# Patient Record
Sex: Female | Born: 1965
Health system: Southern US, Community
[De-identification: ages and names within clinical notes are randomized; demographics above are authoritative.]

## PROBLEM LIST (undated history)

## (undated) DIAGNOSIS — D509 Iron deficiency anemia, unspecified: Secondary | ICD-10-CM

## (undated) DIAGNOSIS — R7303 Prediabetes: Secondary | ICD-10-CM

## (undated) DIAGNOSIS — I10 Essential (primary) hypertension: Secondary | ICD-10-CM

## (undated) DIAGNOSIS — I5022 Chronic systolic (congestive) heart failure: Secondary | ICD-10-CM

## (undated) DIAGNOSIS — F32A Depression, unspecified: Secondary | ICD-10-CM

## (undated) DIAGNOSIS — F419 Anxiety disorder, unspecified: Secondary | ICD-10-CM

## (undated) DIAGNOSIS — I428 Other cardiomyopathies: Secondary | ICD-10-CM

## (undated) DIAGNOSIS — F329 Major depressive disorder, single episode, unspecified: Secondary | ICD-10-CM

## (undated) HISTORY — PX: TONSILLECTOMY: SUR1361

## (undated) HISTORY — DX: Depression, unspecified: F32.A

## (undated) HISTORY — DX: Prediabetes: R73.03

## (undated) HISTORY — DX: Iron deficiency anemia, unspecified: D50.9

## (undated) HISTORY — DX: Other cardiomyopathies: I42.8

## (undated) HISTORY — DX: Essential (primary) hypertension: I10

## (undated) HISTORY — DX: Chronic systolic (congestive) heart failure: I50.22

## (undated) HISTORY — DX: Major depressive disorder, single episode, unspecified: F32.9

## (undated) HISTORY — PX: TUBAL LIGATION: SHX77

## (undated) HISTORY — DX: Anxiety disorder, unspecified: F41.9

---

## 1998-06-18 ENCOUNTER — Emergency Department (HOSPITAL_COMMUNITY): Admission: EM | Admit: 1998-06-18 | Discharge: 1998-06-18 | Payer: Self-pay | Admitting: Emergency Medicine

## 2000-12-29 ENCOUNTER — Emergency Department (HOSPITAL_COMMUNITY): Admission: EM | Admit: 2000-12-29 | Discharge: 2000-12-29 | Payer: Self-pay | Admitting: Emergency Medicine

## 2005-06-20 ENCOUNTER — Emergency Department (HOSPITAL_COMMUNITY): Admission: EM | Admit: 2005-06-20 | Discharge: 2005-06-20 | Payer: Self-pay | Admitting: Family Medicine

## 2005-06-23 ENCOUNTER — Emergency Department (HOSPITAL_COMMUNITY): Admission: EM | Admit: 2005-06-23 | Discharge: 2005-06-23 | Payer: Self-pay | Admitting: Family Medicine

## 2014-11-02 DIAGNOSIS — I428 Other cardiomyopathies: Secondary | ICD-10-CM

## 2014-11-02 HISTORY — DX: Other cardiomyopathies: I42.8

## 2014-11-03 ENCOUNTER — Encounter (HOSPITAL_COMMUNITY): Payer: Self-pay | Admitting: Emergency Medicine

## 2014-11-03 ENCOUNTER — Emergency Department (HOSPITAL_COMMUNITY): Payer: No Typology Code available for payment source

## 2014-11-03 ENCOUNTER — Emergency Department (INDEPENDENT_AMBULATORY_CARE_PROVIDER_SITE_OTHER)
Admission: EM | Admit: 2014-11-03 | Discharge: 2014-11-03 | Disposition: A | Discharge status: Nursing Home (Includes ICF) | Payer: Self-pay | Source: Home / Self Care | Attending: Family Medicine | Admitting: Family Medicine

## 2014-11-03 ENCOUNTER — Inpatient Hospital Stay (HOSPITAL_COMMUNITY)
Admission: EM | Admit: 2014-11-03 | Discharge: 2014-11-09 | DRG: 287 | Disposition: A | Discharge status: Home / Self Care | Payer: No Typology Code available for payment source | Attending: Internal Medicine | Admitting: Internal Medicine

## 2014-11-03 DIAGNOSIS — N289 Disorder of kidney and ureter, unspecified: Secondary | ICD-10-CM

## 2014-11-03 DIAGNOSIS — I5023 Acute on chronic systolic (congestive) heart failure: Secondary | ICD-10-CM | POA: Insufficient documentation

## 2014-11-03 DIAGNOSIS — I5033 Acute on chronic diastolic (congestive) heart failure: Secondary | ICD-10-CM | POA: Insufficient documentation

## 2014-11-03 DIAGNOSIS — I5041 Acute combined systolic (congestive) and diastolic (congestive) heart failure: Secondary | ICD-10-CM | POA: Diagnosis present

## 2014-11-03 DIAGNOSIS — R0602 Shortness of breath: Secondary | ICD-10-CM | POA: Diagnosis present

## 2014-11-03 DIAGNOSIS — Z6837 Body mass index (BMI) 37.0-37.9, adult: Secondary | ICD-10-CM | POA: Diagnosis not present

## 2014-11-03 DIAGNOSIS — I5022 Chronic systolic (congestive) heart failure: Secondary | ICD-10-CM | POA: Diagnosis present

## 2014-11-03 DIAGNOSIS — I429 Cardiomyopathy, unspecified: Secondary | ICD-10-CM | POA: Diagnosis present

## 2014-11-03 DIAGNOSIS — I509 Heart failure, unspecified: Secondary | ICD-10-CM

## 2014-11-03 DIAGNOSIS — I10 Essential (primary) hypertension: Secondary | ICD-10-CM | POA: Diagnosis not present

## 2014-11-03 DIAGNOSIS — R600 Localized edema: Secondary | ICD-10-CM

## 2014-11-03 DIAGNOSIS — D509 Iron deficiency anemia, unspecified: Secondary | ICD-10-CM | POA: Diagnosis present

## 2014-11-03 DIAGNOSIS — E669 Obesity, unspecified: Secondary | ICD-10-CM | POA: Diagnosis not present

## 2014-11-03 DIAGNOSIS — R140 Abdominal distension (gaseous): Secondary | ICD-10-CM

## 2014-11-03 DIAGNOSIS — I428 Other cardiomyopathies: Secondary | ICD-10-CM

## 2014-11-03 LAB — I-STAT TROPONIN, ED: Troponin i, poc: 0.02 ng/mL (ref 0.00–0.08)

## 2014-11-03 LAB — CBC WITH DIFFERENTIAL/PLATELET
BASOS ABS: 0 10*3/uL (ref 0.0–0.1)
BASOS PCT: 0 % (ref 0–1)
Eosinophils Absolute: 0.1 10*3/uL (ref 0.0–0.7)
Eosinophils Relative: 1 % (ref 0–5)
HCT: 36.5 % (ref 36.0–46.0)
Hemoglobin: 11.4 g/dL — ABNORMAL LOW (ref 12.0–15.0)
LYMPHS ABS: 2.1 10*3/uL (ref 0.7–4.0)
LYMPHS PCT: 30 % (ref 12–46)
MCH: 21.8 pg — AB (ref 26.0–34.0)
MCHC: 31.2 g/dL (ref 30.0–36.0)
MCV: 69.9 fL — AB (ref 78.0–100.0)
MONO ABS: 0.6 10*3/uL (ref 0.1–1.0)
Monocytes Relative: 9 % (ref 3–12)
Neutro Abs: 4.1 10*3/uL (ref 1.7–7.7)
Neutrophils Relative %: 60 % (ref 43–77)
PLATELETS: 315 10*3/uL (ref 150–400)
RBC: 5.22 MIL/uL — ABNORMAL HIGH (ref 3.87–5.11)
RDW: 17.7 % — AB (ref 11.5–15.5)
WBC: 6.9 10*3/uL (ref 4.0–10.5)

## 2014-11-03 LAB — RETICULOCYTES
RBC.: 5.38 MIL/uL — ABNORMAL HIGH (ref 3.87–5.11)
RETIC COUNT ABSOLUTE: 69.9 10*3/uL (ref 19.0–186.0)
Retic Ct Pct: 1.3 % (ref 0.4–3.1)

## 2014-11-03 LAB — COMPREHENSIVE METABOLIC PANEL
ALBUMIN: 3.5 g/dL (ref 3.5–5.0)
ALT: 18 U/L (ref 14–54)
ANION GAP: 9 (ref 5–15)
AST: 30 U/L (ref 15–41)
Alkaline Phosphatase: 76 U/L (ref 38–126)
BILIRUBIN TOTAL: 1.3 mg/dL — AB (ref 0.3–1.2)
BUN: 10 mg/dL (ref 6–20)
CALCIUM: 9 mg/dL (ref 8.9–10.3)
CO2: 25 mmol/L (ref 22–32)
CREATININE: 1.23 mg/dL — AB (ref 0.44–1.00)
Chloride: 107 mmol/L (ref 101–111)
GFR calc Af Amer: 59 mL/min — ABNORMAL LOW (ref >60)
GFR calc non Af Amer: 51 mL/min — ABNORMAL LOW (ref >60)
GLUCOSE: 91 mg/dL (ref 65–99)
Potassium: 4 mmol/L (ref 3.5–5.1)
SODIUM: 141 mmol/L (ref 135–145)
Total Protein: 7.5 g/dL (ref 6.5–8.1)

## 2014-11-03 LAB — CBC
HCT: 37.7 % (ref 36.0–46.0)
Hemoglobin: 11.7 g/dL — ABNORMAL LOW (ref 12.0–15.0)
MCH: 21.7 pg — ABNORMAL LOW (ref 26.0–34.0)
MCHC: 31 g/dL (ref 30.0–36.0)
MCV: 70.1 fL — AB (ref 78.0–100.0)
PLATELETS: 330 10*3/uL (ref 150–400)
RBC: 5.38 MIL/uL — ABNORMAL HIGH (ref 3.87–5.11)
RDW: 17.9 % — AB (ref 11.5–15.5)
WBC: 8 10*3/uL (ref 4.0–10.5)

## 2014-11-03 LAB — BRAIN NATRIURETIC PEPTIDE: B Natriuretic Peptide: 1168.3 pg/mL — ABNORMAL HIGH (ref 0.0–100.0)

## 2014-11-03 LAB — POC URINE PREG, ED: PREG TEST UR: NEGATIVE

## 2014-11-03 MED ORDER — PNEUMOCOCCAL VAC POLYVALENT 25 MCG/0.5ML IJ INJ
0.5000 mL | INJECTION | INTRAMUSCULAR | Status: AC
  Administered 2014-11-04: 0.5 mL via INTRAMUSCULAR
  Filled 2014-11-03: qty 0.5

## 2014-11-03 MED ORDER — FUROSEMIDE 10 MG/ML IJ SOLN
40.0000 mg | Freq: Once | INTRAMUSCULAR | Status: AC
  Administered 2014-11-03: 40 mg via INTRAVENOUS
  Filled 2014-11-03: qty 4

## 2014-11-03 MED ORDER — LISINOPRIL 5 MG PO TABS
5.0000 mg | ORAL_TABLET | Freq: Every day | ORAL | Status: DC
  Administered 2014-11-04 – 2014-11-09 (×7): 5 mg via ORAL
  Filled 2014-11-03 (×7): qty 1

## 2014-11-03 MED ORDER — ONDANSETRON HCL 4 MG/2ML IJ SOLN
4.0000 mg | Freq: Four times a day (QID) | INTRAMUSCULAR | Status: DC | PRN
  Administered 2014-11-04 – 2014-11-05 (×3): 4 mg via INTRAVENOUS
  Filled 2014-11-03 (×3): qty 2

## 2014-11-03 MED ORDER — SODIUM CHLORIDE 0.9 % IV SOLN
250.0000 mL | INTRAVENOUS | Status: DC | PRN
Start: 2014-11-03 — End: 2014-11-07

## 2014-11-03 MED ORDER — ACETAMINOPHEN 325 MG PO TABS
650.0000 mg | ORAL_TABLET | ORAL | Status: DC | PRN
  Administered 2014-11-05: 650 mg via ORAL
  Filled 2014-11-03: qty 2

## 2014-11-03 MED ORDER — ASPIRIN EC 81 MG PO TBEC
81.0000 mg | DELAYED_RELEASE_TABLET | Freq: Every day | ORAL | Status: DC
  Administered 2014-11-04 – 2014-11-09 (×7): 81 mg via ORAL
  Filled 2014-11-03 (×7): qty 1

## 2014-11-03 MED ORDER — ENOXAPARIN SODIUM 40 MG/0.4ML ~~LOC~~ SOLN
40.0000 mg | SUBCUTANEOUS | Status: DC
  Administered 2014-11-04 – 2014-11-08 (×6): 40 mg via SUBCUTANEOUS
  Filled 2014-11-03 (×7): qty 0.4

## 2014-11-03 MED ORDER — SODIUM CHLORIDE 0.9 % IJ SOLN
3.0000 mL | INTRAMUSCULAR | Status: DC | PRN
  Administered 2014-11-06: 3 mL via INTRAVENOUS
  Filled 2014-11-03: qty 3

## 2014-11-03 MED ORDER — SODIUM CHLORIDE 0.9 % IJ SOLN
3.0000 mL | Freq: Two times a day (BID) | INTRAMUSCULAR | Status: DC
  Administered 2014-11-04 – 2014-11-07 (×6): 3 mL via INTRAVENOUS

## 2014-11-03 MED ORDER — FUROSEMIDE 10 MG/ML IJ SOLN
40.0000 mg | Freq: Every day | INTRAMUSCULAR | Status: DC
  Administered 2014-11-04 – 2014-11-05 (×2): 40 mg via INTRAVENOUS
  Filled 2014-11-03 (×2): qty 4

## 2014-11-03 NOTE — ED Provider Notes (Signed)
CSN: [id number]     Arrival date & time 11/03/14  1642 History   First MD Initiated Contact with Patient 11/03/14 1916     Chief Complaint  Patient presents with  . Shortness of Breath  . Abdominal Pain     (Consider location/radiation/quality/duration/timing/severity/associated sxs/prior Treatment) Patient is a 49 y.o. female presenting with shortness of breath and abdominal pain. The history is provided by the patient.  Shortness of Breath Severity:  Mild Onset quality:  Gradual Duration:  3 weeks Timing:  Constant Progression:  Unchanged Chronicity:  New Relieved by:  Nothing Worsened by:  Nothing tried Associated symptoms: abdominal pain   Associated symptoms: no chest pain and no fever   Abdominal Pain Associated symptoms: fatigue and shortness of breath   Associated symptoms: no chest pain, no chills and no fever     History reviewed. No pertinent past medical history. Past Surgical History  Procedure Laterality Date  . Tonsillectomy    . Tubal ligation     Family History  Problem Relation Age of Onset  . Diabetes Mother   . Diabetes Father   . Diabetes Sister    History  Substance Use Topics  . Smoking status: Never Smoker   . Smokeless tobacco: Not on file  . Alcohol Use: No   OB History    No data available     Review of Systems  Constitutional: Positive for fatigue. Negative for fever and chills.  Respiratory: Positive for shortness of breath.   Cardiovascular: Negative for chest pain.  Gastrointestinal: Positive for abdominal pain.  All other systems reviewed and are negative.     Allergies  Sulfa antibiotics  Home Medications   Prior to Admission medications   Not on File   BP 157/113 mmHg  Pulse 100  Temp(Src) 97.4 F (36.3 C) (Oral)  Resp 17  Ht  (1.702 m)  Wt 259 lb (117.482 kg)  BMI 40.56 kg/m2  SpO2 99%  LMP 10/19/2014 (Exact Date) Physical Exam  Constitutional: She is oriented to person, place, and time. She appears  well-developed and well-nourished. No distress.  HENT:  Head: Normocephalic and atraumatic.  Mouth/Throat: Oropharynx is clear and moist.  Eyes: EOM are normal. Pupils are equal, round, and reactive to light.  Neck: Normal range of motion. Neck supple.  Cardiovascular: Normal rate and regular rhythm.  Exam reveals no friction rub.   No murmur heard. Pulmonary/Chest: Effort normal and breath sounds normal. No respiratory distress. She has no wheezes. She has no rales.  Abdominal: Soft. She exhibits no distension. There is no tenderness. There is no rebound.  Musculoskeletal: Normal range of motion. She exhibits edema (2+).  Neurological: She is alert and oriented to person, place, and time.  Skin: She is not diaphoretic.  Nursing note and vitals reviewed.   ED Course  Procedures (including critical care time) Labs Review Labs Reviewed  BRAIN NATRIURETIC PEPTIDE - Abnormal; Notable for the following:    B Natriuretic Peptide 1168.3 (*)    All other components within normal limits  CBC WITH DIFFERENTIAL/PLATELET - Abnormal; Notable for the following:    RBC 5.22 (*)    Hemoglobin 11.4 (*)    MCV 69.9 (*)    MCH 21.8 (*)    RDW 17.7 (*)    All other components within normal limits  COMPREHENSIVE METABOLIC PANEL - Abnormal; Notable for the following:    Creatinine, Ser 1.23 (*)    Total Bilirubin 1.3 (*)    GFR calc  non Af Amer 51 (*)    GFR calc Af Amer 59 (*)    All other components within normal limits  I-STAT TROPOININ, ED  POC URINE PREG, ED    Imaging Review Dg Chest 2 View (if Patient Has Fever And/or Copd)  11/03/2014   CLINICAL DATA:  Shortness of breath and abdominal pain.  EXAM: CHEST  2 VIEW  COMPARISON:  None  FINDINGS: The heart size and mediastinal contours are within normal limits. Both lungs are clear. The visualized skeletal structures are unremarkable.  IMPRESSION: No active cardiopulmonary disease.   Electronically Signed   By: Signa Kell M.D.   On:  11/03/2014 18:27     EKG Interpretation   Date/Time:  Thursday November 03 2014 17:24:46 EDT Ventricular Rate:  105 PR Interval:  162 QRS Duration: 92 QT Interval:  376 QTC Calculation: 496 R Axis:   117 Text Interpretation:  Sinus tachycardia with occasional Premature  ventricular complexes Possible Left atrial enlargement Right axis  deviation Low voltage QRS Abnormal ECG No prior for comparison Confirmed  by Gwendolyn Grant  MD, Devota Viruet ([id number]) on 11/03/2014 7:21:00 PM      MDM   Final diagnoses:  CHF exacerbation    49 year old female here with shortness of breath for the past several weeks. She's had bilateral leg swelling and shortness of breath with exertion. Denies any chest pain. She is sent from urgent care for further evaluation. Here she is tachycardic, hypertensive. She has 2+ eating edema in bilateral legs. Labs show BNP of 1168. This is consistent with heart failure. 40 mg of Lasix given IV. Patient admitted.    Elwin Mocha, MD 11/03/14 2010

## 2014-11-03 NOTE — Discharge Instructions (Signed)
Please go to the emergency room as you likely will need a full evaluation and workup to properly diagnose her medical problems.

## 2014-11-03 NOTE — ED Provider Notes (Signed)
CSN: [id number]     Arrival date & time 11/03/14  1429 History   First MD Initiated Contact with Patient 11/03/14 1616     Chief Complaint  Patient presents with  . Shortness of Breath  . Bloated   (Consider location/radiation/quality/duration/timing/severity/associated sxs/prior Treatment) HPI  SOB: started a couple weeks ago. Comes on when going up a couple steps or with other mild exertion such as getting out of bed. Denies chest pain, fevers, palpitations, headache, neck stiffness, LOC.  Abd swelling: started 3 wks ago. Patient also developed bilateral lower extremity edema 3 weeks ago. Patient has not been to see a doctor in 8 years. Daily BMs LMP 10/19/14   History reviewed. No pertinent past medical history. History reviewed. No pertinent past surgical history. Family History  Problem Relation Age of Onset  . Diabetes Mother   . Diabetes Father   . Diabetes Sister    History  Substance Use Topics  . Smoking status: Never Smoker   . Smokeless tobacco: Not on file  . Alcohol Use: No   OB History    No data available     Review of Systems Per HPI with all other pertinent systems negative.   Allergies  Sulfa antibiotics  Home Medications   Prior to Admission medications   Not on File   BP 106/80 mmHg  Pulse 106  Temp(Src) 97.9 F (36.6 C) (Oral)  Resp 22  SpO2 99%  LMP 10/19/2014 Physical Exam Physical Exam  Constitutional: oriented to person, place, and time. appears well-developed and well-nourished. No distress.  HENT:  Head: Normocephalic and atraumatic.  Eyes: EOMI. PERRL.  Neck: Normal range of motion.  Cardiovascular: RRR, no m/r/g, 2+ distal pulses,  Pulmonary/Chest: Effort normal and breath sounds normal. No respiratory distress.  Abdominal: Patient is obese which makes abdominal exam somewhat difficult but patient diffusely tender with some distention. Normal active bowel sounds..  Musculoskeletal: Normal range of motion. Non ttp, no  effusion.  Neurological: alert and oriented to person, place, and time.  Skin: Skin is warm. No rash noted. non diaphoretic.  Psychiatric: normal mood and affect. behavior is normal. Judgment and thought content normal.   ED Course  Procedures (including critical care time) Labs Review Labs Reviewed - No data to display  Imaging Review No results found.   MDM   1. Abdominal distension   2. Bilateral leg edema   3. SOB (shortness of breath)    Patient presenting with shortness of breath and dyspnea on exertion. Has not seen a physician in 8 years. She also has abdominal distention and bilateral lower extremity edema. Her vital signs are stable and she warrants a more extensive workup and recommended patient go to the emergency room for further management. Patient amenable to this and will do so via company transport.    Ozella Rocks, MD 11/03/14 [phone]

## 2014-11-03 NOTE — ED Notes (Signed)
C/o  Cough.  Sob, worse with activity.  Bloating/abdominal swelling states "my abdomen is hard and swollen".   Symptoms present for several weeks.   No otc treatments.   Normal BM.  Denies fever, n/v/d.

## 2014-11-03 NOTE — ED Notes (Signed)
Pt reports she was seen at [facility] and sent to be evaluated in the ED. Pt reports SOB, abdominal pain with distention, and bilateral leg swelling. Pt states SOB increases with activity. Pt states she thinks she has gained weight in the past two months since her sx started.

## 2014-11-03 NOTE — H&P (Signed)
PCP:   No primary care provider on file.   Chief Complaint:  Sob/doe  HPI: 49 yo healthy female with 3 weeks of progressive worsening sob especially with exertion associated with PND, LE and pannus swelling and cough when lying flat.  Dyspnea is worse when lying flat and trying to sleep at night.  No fevers.  Cough nonproductive.  She has no idea what her baseline weight is but knows she has gained significant weight over the last month.  No chest pain.  Pt had to walk over a mile to urgent care center today because the battery in her car went out.  After evaluation at urgent care, she was transported to [place] for evaluation of new chf.  She was very sob when she walked today, but experienced no chest pain.  She is sunburned from the walk.  She also says she has discomfort in her pannus from the swelling, but no abdominal pain.  She does not drink alcohol.  She has no cardiac history or problems with her liver.  No n/v/d.  She takes no daily medications.  Denies any melena or brbpr.  Review of Systems:  Positive and negative as per HPI otherwise all other systems are negative  Past Medical History: History reviewed. No pertinent past medical history. Past Surgical History  Procedure Laterality Date  . Tonsillectomy    . Tubal ligation      Medications: Prior to Admission medications   Not on File  none  Allergies:   Allergies  Allergen Reactions  . Sulfa Antibiotics Other (See Comments)    unknown    Social History:  reports that she has never smoked. She does not have any smokeless tobacco history on file. She reports that she does not drink alcohol or use illicit drugs.  Family History: Family History  Problem Relation Age of Onset  . Diabetes Mother   . Diabetes Father   . Diabetes Sister   father had cabg in his mid 25s, died at 62.  Physical Exam: Filed Vitals:   11/03/14 1732 11/03/14 1922 11/03/14 1923 11/03/14 1925  BP: 166/100 157/113    Pulse: 95  98 100   Temp: 97.4 F (36.3 C)     TempSrc: Oral     Resp: 18 19 18 17   Height:      Weight:      SpO2: 100%  97% 99%   General appearance: alert, cooperative and no distress  sunburned Head: Normocephalic, without obvious abnormality, atraumatic Eyes: negative Nose: Nares normal. Septum midline. Mucosa normal. No drainage or sinus tenderness. Neck: no JVD and supple, symmetrical, trachea midline Lungs: clear to auscultation bilaterally Heart: regular rate and rhythm, S1, S2 normal, no murmur, click, rub or gallop Abdomen: soft, non-tender; bowel sounds normal; no masses,  no organomegaly  Pannus with mild edema Extremities: edema 1 + ble edema ankles Pulses: 2+ and symmetric Skin: Skin color, texture, turgor normal. No rashes or lesions  Sunburned face /neck Neurologic: Grossly normal  Labs on Admission:   Recent Labs  11/03/14 1800  NA 141  K 4.0  CL 107  CO2 25  GLUCOSE 91  BUN 10  CREATININE 1.23*  CALCIUM 9.0    Recent Labs  11/03/14 1800  AST 30  ALT 18  ALKPHOS 76  BILITOT 1.3*  PROT 7.5  ALBUMIN 3.5    Recent Labs  11/03/14 1800  WBC 6.9  NEUTROABS 4.1  HGB 11.4*  HCT 36.5  MCV 69.9*  PLT 315    Radiological Exams on Admission: Dg Chest 2 View (if Patient Has Fever And/or Copd)  11/03/2014   CLINICAL DATA:  Shortness of breath and abdominal pain.  EXAM: CHEST  2 VIEW  COMPARISON:  None  FINDINGS: The heart size and mediastinal contours are within normal limits. Both lungs are clear. The visualized skeletal structures are unremarkable.  IMPRESSION: No active cardiopulmonary disease.   Electronically Signed   By: Signa Kell M.D.   On: 11/03/2014 18:27   12 lead ekg reviewed by myself sinus tachy no acute changes cxr reviewed, no edema or infiltrate  Assessment/Plan  50 yo previously healthy female with new onset congestive heart failure, uncontrolled hypertension and microcytic anemia  Principal Problem:   New onset CHF exacerbation-  Unknown  etiology.  Place on chf pathway, order 2 D echo for am.  Place on iv lasix  daily.  Monitor closely i/o and daily weights.  Start on ace inhibitor lisinopril  daily, no bblocker due to acute decompensation at this time.  Place on daily aspirin.  Will need full work up of stress testing vs possible cath at some point.  Serial cardiac enzymes overnight.  Oxygen sats are 100% at this time, but monitor closely for any further decompensation.  Should respond to iv diuresis.    Active Problems:   SOB (shortness of breath)-  Due to above   Uncontrolled hypertension-  New problem.  Start lisinopril.     New Microcytic anemia-  Check anemia panel and heme occult stools.  No overt bleeding.  Further w/u can be done as outpatient unless acute bleeding issue occurs while in hospital.  Repeat cbc in am.  new Renal insufficiency, mild-  Unknown baseline.  Monitor cr daily closely with new institution of iv lasix.  Obesity - noted  Admit to telemetry floor.  FULL CODE.  LOS 2-3 days.   DAVID,RACHAL A 11/03/2014, 7:53 PM

## 2014-11-03 NOTE — ED Notes (Signed)
Report attempted [facility]

## 2014-11-04 ENCOUNTER — Observation Stay (INDEPENDENT_AMBULATORY_CARE_PROVIDER_SITE_OTHER): Payer: No Typology Code available for payment source

## 2014-11-04 ENCOUNTER — Other Ambulatory Visit: Payer: Self-pay

## 2014-11-04 ENCOUNTER — Encounter (HOSPITAL_COMMUNITY): Payer: Self-pay | Admitting: Cardiology

## 2014-11-04 DIAGNOSIS — I10 Essential (primary) hypertension: Secondary | ICD-10-CM

## 2014-11-04 DIAGNOSIS — I428 Other cardiomyopathies: Secondary | ICD-10-CM

## 2014-11-04 DIAGNOSIS — R0600 Dyspnea, unspecified: Secondary | ICD-10-CM

## 2014-11-04 LAB — TROPONIN I
TROPONIN I: 0.03 ng/mL (ref <0.031)
Troponin I: 0.03 ng/mL (ref <0.031)
Troponin I: 0.03 ng/mL (ref <0.031)

## 2014-11-04 LAB — BASIC METABOLIC PANEL
Anion gap: 14 (ref 5–15)
BUN: 10 mg/dL (ref 6–20)
CALCIUM: 8.6 mg/dL — AB (ref 8.9–10.3)
CHLORIDE: 103 mmol/L (ref 101–111)
CO2: 22 mmol/L (ref 22–32)
Creatinine, Ser: 1.15 mg/dL — ABNORMAL HIGH (ref 0.44–1.00)
GFR calc non Af Amer: 55 mL/min — ABNORMAL LOW (ref >60)
Glucose, Bld: 74 mg/dL (ref 65–99)
POTASSIUM: 3.7 mmol/L (ref 3.5–5.1)
SODIUM: 139 mmol/L (ref 135–145)

## 2014-11-04 LAB — VITAMIN B12: Vitamin B-12: 846 pg/mL (ref 180–914)

## 2014-11-04 LAB — IRON AND TIBC
Iron: 25 ug/dL — ABNORMAL LOW (ref 28–170)
Saturation Ratios: 6 % — ABNORMAL LOW (ref 10.4–31.8)
TIBC: 398 ug/dL (ref 250–450)
UIBC: 373 ug/dL

## 2014-11-04 LAB — T4, FREE: Free T4: 1.22 ng/dL — ABNORMAL HIGH (ref 0.61–1.12)

## 2014-11-04 LAB — MAGNESIUM: Magnesium: 1.6 mg/dL — ABNORMAL LOW (ref 1.7–2.4)

## 2014-11-04 LAB — CBC
HCT: 35 % — ABNORMAL LOW (ref 36.0–46.0)
Hemoglobin: 11 g/dL — ABNORMAL LOW (ref 12.0–15.0)
MCH: 22 pg — ABNORMAL LOW (ref 26.0–34.0)
MCHC: 31.4 g/dL (ref 30.0–36.0)
MCV: 69.9 fL — ABNORMAL LOW (ref 78.0–100.0)
Platelets: 260 10*3/uL (ref 150–400)
RBC: 5.01 MIL/uL (ref 3.87–5.11)
RDW: 17.8 % — ABNORMAL HIGH (ref 11.5–15.5)
WBC: 7.2 10*3/uL (ref 4.0–10.5)

## 2014-11-04 LAB — CREATININE, SERUM
Creatinine, Ser: 1.27 mg/dL — ABNORMAL HIGH (ref 0.44–1.00)
GFR calc non Af Amer: 49 mL/min — ABNORMAL LOW (ref >60)
GFR, EST AFRICAN AMERICAN: 57 mL/min — AB (ref >60)

## 2014-11-04 LAB — FERRITIN: Ferritin: 10 ng/mL — ABNORMAL LOW (ref 11–307)

## 2014-11-04 LAB — TSH: TSH: 1.192 u[IU]/mL (ref 0.350–4.500)

## 2014-11-04 LAB — FOLATE: FOLATE: 31 ng/mL (ref >5.9)

## 2014-11-04 MED ORDER — CARVEDILOL 3.125 MG PO TABS
3.1250 mg | ORAL_TABLET | Freq: Two times a day (BID) | ORAL | Status: DC
Start: 2014-11-04 — End: 2014-11-04
  Administered 2014-11-04: 3.125 mg via ORAL
  Filled 2014-11-04 (×2): qty 1

## 2014-11-04 MED ORDER — NITROGLYCERIN 0.4 MG SL SUBL
SUBLINGUAL_TABLET | SUBLINGUAL | Status: AC
  Administered 2014-11-04: 02:00:00
  Administered 2014-11-04 (×2): 0.4 mg
  Filled 2014-11-04: qty 1

## 2014-11-04 MED ORDER — CARVEDILOL 3.125 MG PO TABS
3.1250 mg | ORAL_TABLET | Freq: Two times a day (BID) | ORAL | Status: DC
  Administered 2014-11-05 – 2014-11-08 (×7): 3.125 mg via ORAL
  Filled 2014-11-04 (×10): qty 1

## 2014-11-04 MED ORDER — TRAMADOL HCL 50 MG PO TABS
50.0000 mg | ORAL_TABLET | Freq: Once | ORAL | Status: AC
Start: 2014-11-04 — End: 2014-11-04
  Administered 2014-11-04: 50 mg via ORAL
  Filled 2014-11-04: qty 1

## 2014-11-04 MED ORDER — HYDROCODONE-ACETAMINOPHEN 5-325 MG PO TABS
1.0000 | ORAL_TABLET | Freq: Once | ORAL | Status: AC | PRN
Start: 2014-11-04 — End: 2014-11-04

## 2014-11-04 NOTE — Progress Notes (Addendum)
Heart Failure Navigator Consult Note  Presentation: Abigail Sharp is a 49 yo healthy female with 3 weeks of progressive worsening sob especially with exertion associated with PND, LE and pannus swelling and cough when lying flat. Dyspnea is worse when lying flat and trying to sleep at night. No fevers. Cough nonproductive. She has no idea what her baseline weight is but knows she has gained significant weight over the last month. No chest pain. Pt had to walk over a mile to urgent care center today because the battery in her car went out. After evaluation at urgent care, she was transported to [facility] for evaluation of new chf. She was very sob when she walked today, but experienced no chest pain. She is sunburned from the walk. She also says she has discomfort in her pannus from the swelling, but no abdominal pain. She does not drink alcohol. She has no cardiac history or problems with her liver. No n/v/d. She takes no daily medications. Denies any melena or brbpr.   History reviewed. No pertinent past medical history.  History   Social History  . Marital Status: Single    Spouse Name: N/A  . Number of Children: N/A  . Years of Education: N/A   Social History Main Topics  . Smoking status: Never Smoker   . Smokeless tobacco: Not on file  . Alcohol Use: No  . Drug Use: No  . Sexual Activity: Yes   Other Topics Concern  . None   Social History Narrative    ECHO:Study Conclusions--11/04/14  - Left ventricle: The cavity size was mildly dilated. Wall thickness was increased in a pattern of mild LVH. The estimated ejection fraction was 25%. Diffuse hypokinesis. Findings consistent with left ventricular diastolic dysfunction. - Mitral valve: There was moderate to severe regurgitation. - Left atrium: The atrium was moderately dilated. - Right ventricle: The cavity size was mildly dilated. Systolic function was moderately to severely reduced. - Right atrium: The  atrium was mildly dilated. - Pulmonary arteries: PA peak pressure: 40 mm Hg (S). - Pericardium, extracardiac: A trivial pericardial effusion was identified posterior to the heart.  Transthoracic echocardiography. M-mode, complete 2D, spectral Doppler, and color Doppler. Birthdate: Patient birthdate: January 12, 1966. Age: Patient is 49 yr old. Sex: Gender: female. BMI: 39.6 kg/m^2. Blood pressure:   148/105 Patient status: Inpatient. Study date: Study date: 11/04/2014. Study time: 10:30 AM. Location: Bedside.  BNP    Component Value Date/Time   BNP 1168.3* 11/03/2014 1800    ProBNP No results found for: PROBNP   Education Assessment and Provision:  Detailed education and instructions provided on heart failure disease management including the following:  Signs and symptoms of Heart Failure When to call the physician Importance of daily weights Low sodium diet Fluid restriction Medication management Anticipated future follow-up appointments  Patient education given on each of the above topics.  Patient acknowledges understanding and acceptance of all instructions.  Patient was asleep-she requested that I return.  I will plan to review HF recommendations on Monday if patient remains in the hospital. --Monday- I spoke with Ms. Obie Dredge at length regarding her HF today.  She is for a cardiac catheterization today as well.  She seems to have a basic understanding of HF and HF recommendations.  She asked many pertinent questions and wants to make changes necessary for her health.  She does admit that she has just "stocked up on can soups" and was unhappy to hear that I do not recommend that she eat  them.  We reviewed a low sodium diet and high sodium foods to avoid.   I have provided her a scale for home use and she says that she will weigh daily.  She will likely need medication assistance to get her medications prior to discharge as I am unsure she can afford them and is  currently uninsured.  I will coordinate with CM regarding medications.  Education Materials:  "Living Better With Heart Failure" Booklet, Daily Weight Tracker Tool .   High Risk Criteria for Readmission and/or Poor Patient Outcomes:   EF <30%- Yes 25%   2 or more admissions in 6 months- No--new HF  Difficult social situation- Yes--no insurance noted--? Issues with medication affordability and ongoing healthcare costs.   Demonstrates medication noncompliance- No-New HF, No reported medications prior to admission    Barriers of Care:  New HF--Knowledge and compliance  Discharge Planning:   Plans to discharge to home alone--Has a son who is currently in [place] visiting his girlfriend.

## 2014-11-04 NOTE — Progress Notes (Signed)
Utilization Review Completed.Abigail Sharp T6/08/2014  

## 2014-11-04 NOTE — Progress Notes (Signed)
[facility]                                                                              Patient Demographics  Abigail Sharp, is a 49 y.o. female, DOB - [birth year], ZOX:[id number]  Admit date - 11/03/2014   Admitting Physician Haydee Monica, MD  Outpatient Primary MD for the patient is No primary care provider on file.  LOS - 1   Chief Complaint  Patient presents with  . Shortness of Breath  . Abdominal Pain      HPI on 11/03/2014 by Dr. Tarry Kos 49 yo healthy female with 3 weeks of progressive worsening sob especially with exertion associated with PND, LE and pannus swelling and cough when lying flat. Dyspnea is worse when lying flat and trying to sleep at night. No fevers. Cough nonproductive. She has no idea what her baseline weight is but knows she has gained significant weight over the last month. No chest pain. Pt had to walk over a mile to urgent care center today because the battery in her car went out. After evaluation at urgent care, she was transported to [place] for evaluation of new chf. She was very sob when she walked today, but experienced no chest pain. She is sunburned from the walk. She also says she has discomfort in her pannus from the swelling, but no abdominal pain. She does not drink alcohol. She has no cardiac history or problems with her liver. No n/v/d. She takes no daily medications. Denies any melena or brbpr.  Assessment & Plan   Dyspnea secondary to possible New-onset CHF -Patient feels her shortness of breath has improved -BNP 1168 -Echocardiogram pending -Continue Lasix -Continue to monitor daily weights, intake and output -Urine output over the past 24 hours: 3350 mL -Pending results of echocardiogram, may consult cardiology -Chest x-ray: No active cardiopulmonary disease -Troponin negative  Microcytic anemia -Unknown baseline -Anemia panel -Denies melanoma -Hemoglobin currently stable, 11  Uncontrolled  hypertension -Patient started on lisinopril as well as receiving Lasix -Blood pressure under better control  Mild renal insufficiency -Unknown baseline creatinine -Creatinine currently improving, 1.15  Code Status: Full  Family Communication: None at bedside  Disposition Plan: Admitted  Time Spent in minutes   30 minutes  Procedures  Echocardiogram  Consults   none  DVT Prophylaxis  Lovenox  Lab Results  Component Value Date   PLT 260 11/04/2014    Medications  Scheduled Meds: . aspirin EC  81 mg Oral Daily  . enoxaparin (LOVENOX) injection  40 mg Subcutaneous Q24H  . furosemide  40 mg Intravenous Daily  . lisinopril  5 mg Oral Daily  . sodium chloride  3 mL Intravenous Q12H   Continuous Infusions:  PRN Meds:.sodium chloride, acetaminophen, HYDROcodone-acetaminophen, ondansetron (ZOFRAN) IV, sodium chloride  Antibiotics    Anti-infectives    None        Subjective:   Abigail Sharp seen and examined today.  She continues to have some shortness of breath however states it is improved. She is able to currently lie down and sleep.   Patient denies any chest pain, abdominal pain, nausea or vomiting. She states she has not seen a primary  care physician in quite some time.  Objective:   Filed Vitals:   11/04/14 0136 11/04/14 0158 11/04/14 0626 11/04/14 1002  BP: 151/96 156/101 148/105 132/75  Pulse: 88 91 85 74  Temp:   97.7 F (36.5 C) 97.8 F (36.6 C)  TempSrc:   Oral Oral  Resp:   18 18  Height:      Weight:   115.078 kg (253 lb 11.2 oz)   SpO2:   99% 100%    Wt Readings from Last 3 Encounters:  11/04/14 115.078 kg (253 lb 11.2 oz)     Intake/Output Summary (Last 24 hours) at 11/04/14 1251 Last data filed at 11/04/14 1250  Gross per 24 hour  Intake    960 ml  Output   5350 ml  Net  -4390 ml    Exam  General: Well developed, well nourished, NAD, appears stated age  HEENT: NCAT, mucous membranes moist.   Cardiovascular: S1 S2  auscultated, no rubs, murmurs or gallops. Regular rate and rhythm.  Respiratory: Diminished however clear  Abdomen: Soft, obese, nontender, nondistended, + bowel sounds  Extremities: warm dry without cyanosis clubbing. +1 edema in LE  Neuro: AAOx3, nonfocal  Psych: Normal affect and demeanor with intact judgement and insight  Data Review   Micro Results No results found for this or any previous visit (from the past 240 hour(s)).  Radiology Reports Dg Chest 2 View (if Patient Has Fever And/or Copd)  11/03/2014   CLINICAL DATA:  Shortness of breath and abdominal pain.  EXAM: CHEST  2 VIEW  COMPARISON:  None  FINDINGS: The heart size and mediastinal contours are within normal limits. Both lungs are clear. The visualized skeletal structures are unremarkable.  IMPRESSION: No active cardiopulmonary disease.   Electronically Signed   By: Signa Kell M.D.   On: 11/03/2014 18:27    CBC  Recent Labs Lab 11/03/14 1800 11/03/14 2303 11/04/14 0421  WBC 6.9 8.0 7.2  HGB 11.4* 11.7* 11.0*  HCT 36.5 37.7 35.0*  PLT 315 330 260  MCV 69.9* 70.1* 69.9*  MCH 21.8* 21.7* 22.0*  MCHC 31.2 31.0 31.4  RDW 17.7* 17.9* 17.8*  LYMPHSABS 2.1  --   --   MONOABS 0.6  --   --   EOSABS 0.1  --   --   BASOSABS 0.0  --   --     Chemistries   Recent Labs Lab 11/03/14 1800 11/03/14 2303 11/04/14 0421  NA 141  --  139  K 4.0  --  3.7  CL 107  --  103  CO2 25  --  22  GLUCOSE 91  --  74  BUN 10  --  10  CREATININE 1.23* 1.27* 1.15*  CALCIUM 9.0  --  8.6*  AST 30  --   --   ALT 18  --   --   ALKPHOS 76  --   --   BILITOT 1.3*  --   --    ------------------------------------------------------------------------------------------------------------------ estimated creatinine clearance is 78.4 mL/min (by C-G formula based on Cr of 1.15). ------------------------------------------------------------------------------------------------------------------ No results for input(s): HGBA1C in the  last 72 hours. ------------------------------------------------------------------------------------------------------------------ No results for input(s): CHOL, HDL, LDLCALC, TRIG, CHOLHDL, LDLDIRECT in the last 72 hours. ------------------------------------------------------------------------------------------------------------------ No results for input(s): TSH, T4TOTAL, T3FREE, THYROIDAB in the last 72 hours.  Invalid input(s): FREET3 ------------------------------------------------------------------------------------------------------------------  Recent Labs  11/03/14 2302 11/03/14 2303  VITAMINB12  --  846  FOLATE 31.0  --   FERRITIN  --  10*  TIBC  --  398  IRON  --  25*  RETICCTPCT  --  1.3    Coagulation profile No results for input(s): INR, PROTIME in the last 168 hours.  No results for input(s): DDIMER in the last 72 hours.  Cardiac Enzymes  Recent Labs Lab 11/03/14 2303 11/04/14 0421  TROPONINI 0.03 0.03   ------------------------------------------------------------------------------------------------------------------ Invalid input(s): POCBNP    Arma Reining D.O. on 11/04/2014 at 12:51 PM  Between 7am to 7pm - Pager - [phone]  After 7pm go to [email or web address] - password [email or web address]  And look for the night coverage person covering for me after hours  [facility] Office  [phone]

## 2014-11-04 NOTE — Progress Notes (Signed)
Discharge instructions discussed with patient and daughter, Abigail Sharp, who is at bedside. Diet, follow up appt, activity and medications discussed. Prescriptions given. Daughter and patient verbally understand instructions./

## 2014-11-04 NOTE — Progress Notes (Addendum)
Received report on Abigail Sharp for current 3:30p-7:30p shift.  She finished lunch and is voiding large amounts.  Has voided 2000 ml since 3:30p.  Alert and oriented and no c/o pain.  Pt watching Heart Failure video currently.  Received Living Better With Heart Failure Booklet earlier today.  Will f/u with questions after video viewed.

## 2014-11-04 NOTE — Consult Note (Signed)
Reason for Consult: CHF and chest pain  Referring Physician: Dr. Ree Kida    PCP:  No primary care provider on file.  Primary Cardiologist: new  Abigail Sharp is an 49 y.o. female.    Chief Complaint:  Presented with cough, SOB DOE bloating, abd swelling.   HPI: 49 yo healthy female with 3 weeks of progressive worsening sob especially with exertion associated with PND, LE and pannus swelling and cough when lying flat. Dyspnea is worse when lying flat and trying to sleep at night. No fevers. Cough nonproductive. She has no idea what her baseline weight is but knows she has gained significant weight over the last month. No chest pain. Pt had to walk over a mile to urgent care center yesterday because the battery in her car went out. After evaluation at urgent care, she was transported to [place] for evaluation of new chf. She was very sob when she walked the mile- which took her 3 hours, she could only walk a little at a time and rest, but experienced no chest pain. She is sunburned from the walk. She also says she has discomfort in her pannus from the swelling, but no abdominal pain. She does not drink alcohol. She has no cardiac history or problems with her liver. No n/v/d. She takes no daily medications. Denies any melena.  She does have palpitations and rapid HR at times.   EKG ST at 105, no acute changes Troponin 0.03 negative X 3 , Iron is low at 25  I&O negative 6150 since admit, wt down from 259 to 253 (-6lbs)   ECHO: Left ventricle: The cavity size was mildly dilated. Wall thickness was increased in a pattern of mild LVH. The estimated ejection fraction was 25%. Diffuse hypokinesis. Findings consistent with left ventricular diastolic dysfunction. - Mitral valve: There was moderate to severe regurgitation. - Left atrium: The atrium was moderately dilated. - Right ventricle: The cavity size was mildly dilated. Systolic function was moderately to  severely reduced. - Right atrium: The atrium was mildly dilated. - Pulmonary arteries: PA peak pressure: 40 mm Hg (S). - Pericardium, extracardiac: A trivial pericardial effusion was identified posterior to the heart.   History reviewed. No pertinent past medical history.  Past Surgical History  Procedure Laterality Date  . Tonsillectomy    . Tubal ligation      Family History  Problem Relation Age of Onset  . Diabetes Mother   . Diabetes Father   . Diabetes Sister    Social History:  reports that she has never smoked. She does not have any smokeless tobacco history on file. She reports that she does not drink alcohol or use illicit drugs.  Allergies:  Allergies  Allergen Reactions  . Sulfa Antibiotics Other (See Comments)    unknown    OUTPATIENT MEDICATIONS: No current facility-administered medications on file prior to encounter.   No current outpatient prescriptions on file prior to encounter.   Current Medications: Scheduled Meds: . aspirin EC  81 mg Oral Daily  . enoxaparin (LOVENOX) injection  40 mg Subcutaneous Q24H  . furosemide  40 mg Intravenous Daily  . lisinopril  5 mg Oral Daily  . sodium chloride  3 mL Intravenous Q12H   Continuous Infusions:  PRN Meds:.sodium chloride, acetaminophen, HYDROcodone-acetaminophen, ondansetron (ZOFRAN) IV, sodium chloride   Results for orders placed or performed during the hospital encounter of 11/03/14 (from the past 48 hour(s))  Brain natriuretic peptide  Status: Abnormal   Collection Time: 11/03/14  6:00 PM  Result Value Ref Range   B Natriuretic Peptide 1168.3 (H) 0.0 - 100.0 pg/mL  CBC with Differential     Status: Abnormal   Collection Time: 11/03/14  6:00 PM  Result Value Ref Range   WBC 6.9 4.0 - 10.5 K/uL   RBC 5.22 (H) 3.87 - 5.11 MIL/uL   Hemoglobin 11.4 (L) 12.0 - 15.0 g/dL   HCT 36.5 36.0 - 46.0 %   MCV 69.9 (L) 78.0 - 100.0 fL   MCH 21.8 (L) 26.0 - 34.0 pg   MCHC 31.2 30.0 - 36.0 g/dL   RDW  17.7 (H) 11.5 - 15.5 %   Platelets 315 150 - 400 K/uL   Neutrophils Relative % 60 43 - 77 %   Lymphocytes Relative 30 12 - 46 %   Monocytes Relative 9 3 - 12 %   Eosinophils Relative 1 0 - 5 %   Basophils Relative 0 0 - 1 %   Neutro Abs 4.1 1.7 - 7.7 K/uL   Lymphs Abs 2.1 0.7 - 4.0 K/uL   Monocytes Absolute 0.6 0.1 - 1.0 K/uL   Eosinophils Absolute 0.1 0.0 - 0.7 K/uL   Basophils Absolute 0.0 0.0 - 0.1 K/uL   RBC Morphology POLYCHROMASIA PRESENT    WBC Morphology ATYPICAL LYMPHOCYTES    Smear Review LARGE PLATELETS PRESENT   Comprehensive metabolic panel     Status: Abnormal   Collection Time: 11/03/14  6:00 PM  Result Value Ref Range   Sodium 141 135 - 145 mmol/L   Potassium 4.0 3.5 - 5.1 mmol/L   Chloride 107 101 - 111 mmol/L   CO2 25 22 - 32 mmol/L   Glucose, Bld 91 65 - 99 mg/dL   BUN 10 6 - 20 mg/dL   Creatinine, Ser 1.23 (H) 0.44 - 1.00 mg/dL   Calcium 9.0 8.9 - 10.3 mg/dL   Total Protein 7.5 6.5 - 8.1 g/dL   Albumin 3.5 3.5 - 5.0 g/dL   AST 30 15 - 41 U/L   ALT 18 14 - 54 U/L   Alkaline Phosphatase 76 38 - 126 U/L   Total Bilirubin 1.3 (H) 0.3 - 1.2 mg/dL   GFR calc non Af Amer 51 (L) >60 mL/min   GFR calc Af Amer 59 (L) >60 mL/min    Comment: (NOTE) The eGFR has been calculated using the CKD EPI equation. This calculation has not been validated in all clinical situations. eGFR's persistently <60 mL/min signify possible Chronic Kidney Disease.    Anion gap 9 5 - 15  POC Urine Pregnancy, ED (do NOT order at [facility])     Status: None   Collection Time: 11/03/14  6:09 PM  Result Value Ref Range   Preg Test, Ur NEGATIVE NEGATIVE    Comment:        THE SENSITIVITY OF THIS METHODOLOGY IS >24 mIU/mL   I-Stat Troponin, ED (not at [facility])     Status: None   Collection Time: 11/03/14  6:12 PM  Result Value Ref Range   Troponin i, poc 0.02 0.00 - 0.08 ng/mL   Comment 3            Comment: Due to the release kinetics of cTnI, a negative result within the first hours of the  onset of symptoms does not rule out myocardial infarction with certainty. If myocardial infarction is still suspected, repeat the test at appropriate intervals.   Folate       Status: None   Collection Time: 11/03/14 11:02 PM  Result Value Ref Range   Folate 31.0 >5.9 ng/mL  Troponin I     Status: None   Collection Time: 11/03/14 11:03 PM  Result Value Ref Range   Troponin I 0.03 <0.031 ng/mL    Comment:        NO INDICATION OF MYOCARDIAL INJURY.   CBC     Status: Abnormal   Collection Time: 11/03/14 11:03 PM  Result Value Ref Range   WBC 8.0 4.0 - 10.5 K/uL   RBC 5.38 (H) 3.87 - 5.11 MIL/uL   Hemoglobin 11.7 (L) 12.0 - 15.0 g/dL   HCT 37.7 36.0 - 46.0 %   MCV 70.1 (L) 78.0 - 100.0 fL   MCH 21.7 (L) 26.0 - 34.0 pg   MCHC 31.0 30.0 - 36.0 g/dL   RDW 17.9 (H) 11.5 - 15.5 %   Platelets 330 150 - 400 K/uL  Creatinine, serum     Status: Abnormal   Collection Time: 11/03/14 11:03 PM  Result Value Ref Range   Creatinine, Ser 1.27 (H) 0.44 - 1.00 mg/dL   GFR calc non Af Amer 49 (L) >60 mL/min   GFR calc Af Amer 57 (L) >60 mL/min    Comment: (NOTE) The eGFR has been calculated using the CKD EPI equation. This calculation has not been validated in all clinical situations. eGFR's persistently <60 mL/min signify possible Chronic Kidney Disease.   Vitamin B12     Status: None   Collection Time: 11/03/14 11:03 PM  Result Value Ref Range   Vitamin B-12 846 180 - 914 pg/mL    Comment: (NOTE) This assay is not validated for testing neonatal or myeloproliferative syndrome specimens for Vitamin B12 levels.   Iron and TIBC     Status: Abnormal   Collection Time: 11/03/14 11:03 PM  Result Value Ref Range   Iron 25 (L) 28 - 170 ug/dL   TIBC 398 250 - 450 ug/dL   Saturation Ratios 6 (L) 10.4 - 31.8 %   UIBC 373 ug/dL  Ferritin     Status: Abnormal   Collection Time: 11/03/14 11:03 PM  Result Value Ref Range   Ferritin 10 (L) 11 - 307 ng/mL  Reticulocytes     Status: Abnormal    Collection Time: 11/03/14 11:03 PM  Result Value Ref Range   Retic Ct Pct 1.3 0.4 - 3.1 %   RBC. 5.38 (H) 3.87 - 5.11 MIL/uL   Retic Count, Manual 69.9 19.0 - 186.0 K/uL  Basic metabolic panel     Status: Abnormal   Collection Time: 11/04/14  4:21 AM  Result Value Ref Range   Sodium 139 135 - 145 mmol/L   Potassium 3.7 3.5 - 5.1 mmol/L   Chloride 103 101 - 111 mmol/L   CO2 22 22 - 32 mmol/L   Glucose, Bld 74 65 - 99 mg/dL   BUN 10 6 - 20 mg/dL   Creatinine, Ser 1.15 (H) 0.44 - 1.00 mg/dL   Calcium 8.6 (L) 8.9 - 10.3 mg/dL   GFR calc non Af Amer 55 (L) >60 mL/min   GFR calc Af Amer >60 >60 mL/min    Comment: (NOTE) The eGFR has been calculated using the CKD EPI equation. This calculation has not been validated in all clinical situations. eGFR's persistently <60 mL/min signify possible Chronic Kidney Disease.    Anion gap 14 5 - 15  Troponin I     Status: None   Collection  Time: 11/04/14  4:21 AM  Result Value Ref Range   Troponin I 0.03 <0.031 ng/mL    Comment:        NO INDICATION OF MYOCARDIAL INJURY.   CBC     Status: Abnormal   Collection Time: 11/04/14  4:21 AM  Result Value Ref Range   WBC 7.2 4.0 - 10.5 K/uL   RBC 5.01 3.87 - 5.11 MIL/uL   Hemoglobin 11.0 (L) 12.0 - 15.0 g/dL   HCT 35.0 (L) 36.0 - 46.0 %   MCV 69.9 (L) 78.0 - 100.0 fL   MCH 22.0 (L) 26.0 - 34.0 pg   MCHC 31.4 30.0 - 36.0 g/dL   RDW 17.8 (H) 11.5 - 15.5 %   Platelets 260 150 - 400 K/uL  Troponin I     Status: None   Collection Time: 11/04/14 11:20 AM  Result Value Ref Range   Troponin I 0.03 <0.031 ng/mL    Comment:        NO INDICATION OF MYOCARDIAL INJURY.    Dg Chest 2 View (if Patient Has Fever And/or Copd)  11/03/2014   CLINICAL DATA:  Shortness of breath and abdominal pain.  EXAM: CHEST  2 VIEW  COMPARISON:  None  FINDINGS: The heart size and mediastinal contours are within normal limits. Both lungs are clear. The visualized skeletal structures are unremarkable.  IMPRESSION: No active  cardiopulmonary disease.   Electronically Signed   By: Taylor  Stroud M.D.   On: 11/03/2014 18:27    ROS: General:+ cough beginning in Jan. Other wise no colds or fevers, no weight changes Skin:no rashes or ulcers HEENT:no blurred vision, no congestion CV:see HPI PUL:see HPI GI:no diarrhea constipation or melena, no indigestion GU:no hematuria, no dysuria MS:no joint pain, no claudication Neuro:no syncope, no lightheadedness Endo:no diabetes, no thyroid disease   Blood pressure 147/87, pulse 76, temperature 97.7 F (36.5 C), temperature source Oral, resp. rate 20, height 5' 7" (1.702 m), weight 253 lb 11.2 oz (115.078 kg), last menstrual period 10/19/2014, SpO2 100 %.  Wt Readings from Last 3 Encounters:  11/04/14 253 lb 11.2 oz (115.078 kg)    PE: General:Pleasant affect, NAD Skin:Warm and dry, brisk capillary refill HEENT:normocephalic, sclera clear, mucus membranes moist Neck:supple, no JVD- standing up , no bruits  Heart:S1S2 RRR without murmur, gallup, rub or click Lungs:clear without rales, rhonchi, or wheezes Abd:obese, with panus soft, non tender, + BS, do not palpate liver spleen or masses Ext:1+ lower ext edema, 2+ pedal pulses, 2+ radial pulses Neuro:alert and oriented X 3, MAE, follows commands, + facial symmetry Tele:   SR in the 80s  Assessment/Plan Principal Problem:   Acute combined systolic and diastolic HF (heart failure), NYHA class 2-3, overall with -6 L much improved, on Lasix 40 IV every 24, on ACE and now that she has diuresed will begin low dose coreg.  Will need myoview Vs. Cath to determine CAD, father had CABG in his 50s.  Also check lipids.  Dr. Smith to see.  Active Problems:   SOB (shortness of breath), improved   Uncontrolled hypertension, improved   Microcytic anemia with low IRon    Renal insufficiency, mild, Cr 1.15   Cardiomyopathy, EF 25% on Echo, systolic and diastolic dysfunction.     , R  Nurse Practitioner Certified [facility] Pager [phone] or after 5pm or weekends call [phone] 11/04/2014, 5:03 PM       

## 2014-11-04 NOTE — Progress Notes (Signed)
  Echocardiogram 2D Echocardiogram has been performed.  Abigail Sharp 11/04/2014, 11:23 AM

## 2014-11-04 NOTE — Progress Notes (Signed)
Pt had episode of CP, put pt on O2 at 2L, ECG done which was NSR, and gave 3 nitros SL, pain went from a 10/10 to 4/10, Troponin x 1 was negative, MD notified and aware, ordered Ultram since her back and shoulder was hurting, CP was r/o as back and shoulder pain, gave Ultram, and heat pack on her back and pain resolved, will continue to monitor, thanks Glenna Fellows.

## 2014-11-05 LAB — BASIC METABOLIC PANEL
Anion gap: 9 (ref 5–15)
BUN: 14 mg/dL (ref 6–20)
CO2: 27 mmol/L (ref 22–32)
Calcium: 8.2 mg/dL — ABNORMAL LOW (ref 8.9–10.3)
Chloride: 103 mmol/L (ref 101–111)
Creatinine, Ser: 1.04 mg/dL — ABNORMAL HIGH (ref 0.44–1.00)
GFR calc Af Amer: >60 mL/min (ref >60)
Glucose, Bld: 88 mg/dL (ref 65–99)
Potassium: 4.1 mmol/L (ref 3.5–5.1)
SODIUM: 139 mmol/L (ref 135–145)

## 2014-11-05 LAB — LIPID PANEL
Cholesterol: 121 mg/dL (ref 0–200)
HDL: 26 mg/dL — AB (ref >40)
LDL Cholesterol: 79 mg/dL (ref 0–99)
TRIGLYCERIDES: 81 mg/dL (ref <150)
Total CHOL/HDL Ratio: 4.7 RATIO
VLDL: 16 mg/dL (ref 0–40)

## 2014-11-05 LAB — CBC
HCT: 33.8 % — ABNORMAL LOW (ref 36.0–46.0)
HEMOGLOBIN: 10.7 g/dL — AB (ref 12.0–15.0)
MCH: 22.1 pg — AB (ref 26.0–34.0)
MCHC: 31.7 g/dL (ref 30.0–36.0)
MCV: 69.7 fL — ABNORMAL LOW (ref 78.0–100.0)
Platelets: 249 10*3/uL (ref 150–400)
RBC: 4.85 MIL/uL (ref 3.87–5.11)
RDW: 17.8 % — AB (ref 11.5–15.5)
WBC: 5.4 10*3/uL (ref 4.0–10.5)

## 2014-11-05 MED ORDER — FUROSEMIDE 10 MG/ML IJ SOLN: 20.0000 mg | Freq: Every day | INTRAMUSCULAR | Status: DC

## 2014-11-05 MED ORDER — MAGNESIUM SULFATE 2 GM/50ML IV SOLN
2.0000 g | Freq: Once | INTRAVENOUS | Status: AC
  Administered 2014-11-05: 2 g via INTRAVENOUS
  Filled 2014-11-05 (×2): qty 50

## 2014-11-05 NOTE — Progress Notes (Addendum)
[facility]                                                                              Patient Demographics  Abigail Sharp, is a 49 y.o. female, DOB - 02-13-1966, ZOX:[id number]  Admit date - 11/03/2014   Admitting Physician Haydee Monica, MD  Outpatient Primary MD for the patient is No primary care provider on file.  LOS - 2   Chief Complaint  Patient presents with  . Shortness of Breath  . Abdominal Pain      HPI on 11/03/2014 by Dr. Tarry Kos 49 yo healthy female with 3 weeks of progressive worsening sob especially with exertion associated with PND, LE and pannus swelling and cough when lying flat. Dyspnea is worse when lying flat and trying to sleep at night. No fevers. Cough nonproductive. She has no idea what her baseline weight is but knows she has gained significant weight over the last month. No chest pain. Pt had to walk over a mile to urgent care center today because the battery in her car went out. After evaluation at urgent care, she was transported to [place] for evaluation of new chf. She was very sob when she walked today, but experienced no chest pain. She is sunburned from the walk. She also says she has discomfort in her pannus from the swelling, but no abdominal pain. She does not drink alcohol. She has no cardiac history or problems with her liver. No n/v/d. She takes no daily medications. Denies any melena or brbpr.  Assessment & Plan   Dyspnea secondary Acute combined systolic and diastolic heart failure -Improving -BNP 1168 -Echocardiogram EF25%, diffuse hypokinesis, left ventricular diastolic dysfunction -Continue Lasix -Continue to monitor daily weights, intake and output -Urine output over the past 24 hours: 4700 mL -Chest x-ray: No active cardiopulmonary disease -Troponin negative -Cardiology consulted and appreciated, will likely need cath or nuclear study to exclude CAD -Continue coreg, lisinopril, lasix, aspirin -Lipid panel:  Total cholesterol 121, triglycerides 81, HDL 26, LDL 79  Microcytic anemia -Unknown baseline -Anemia panel -Denies melana -Hemoglobin currently stable, 10.7  Uncontrolled hypertension -Patient started on lisinopril, coreg, as well as receiving Lasix -Blood pressure under better control  Mild renal insufficiency -Unknown baseline creatinine -Creatinine currently improving, 1.04  Hypomagnesemia -Replaced  Code Status: Full  Family Communication: None at bedside  Disposition Plan: Admitted. Pending further recommendations from cardiology  Time Spent in minutes   30 minutes  Procedures  Echocardiogram  Consults   none  DVT Prophylaxis  Lovenox  Lab Results  Component Value Date   PLT 249 11/05/2014    Medications  Scheduled Meds: . aspirin EC  81 mg Oral Daily  . carvedilol  3.125 mg Oral BID WC  . enoxaparin (LOVENOX) injection  40 mg Subcutaneous Q24H  . furosemide  40 mg Intravenous Daily  . lisinopril  5 mg Oral Daily  . sodium chloride  3 mL Intravenous Q12H   Continuous Infusions:  PRN Meds:.sodium chloride, acetaminophen, ondansetron (ZOFRAN) IV, sodium chloride  Antibiotics    Anti-infectives    None        Subjective:   [name] seen and examined today.  She states her shortness of breath has improved and she is feeling better. Denies any chest pain, abdominal pain, nausea or vomiting.   Objective:   Filed Vitals:   11/04/14 1409 11/04/14 2152 11/05/14 0500 11/05/14 0922  BP: 147/87 140/89 115/59 118/64  Pulse: 76 87 87   Temp: 97.7 F (36.5 C) 98.2 F (36.8 C) 98 F (36.7 C)   TempSrc: Oral Oral Oral   Resp: 20 20 18    Height:      Weight:   112.129 kg (247 lb 3.2 oz)   SpO2: 100% 99% 100%     Wt Readings from Last 3 Encounters:  11/05/14 112.129 kg (247 lb 3.2 oz)     Intake/Output Summary (Last 24 hours) at 11/05/14 1034 Last data filed at 11/05/14 1014  Gross per 24 hour  Intake    480 ml  Output   5100 ml  Net   -4620 ml    Exam  General: Well developed, well nourished, no distress  HEENT: NCAT, mucous membranes moist.   Cardiovascular: S1 S2 auscultated, no murmurs, RRR  Respiratory: Clear to auscultation  Abdomen: Soft, obese, nontender, nondistended, + bowel sounds  Extremities: warm dry without cyanosis clubbing. +1 edema in LE  Neuro: AAOx3, nonfocal  Psych: Normal affect and demeanor, pleasant  Data Review   Micro Results No results found for this or any previous visit (from the past 240 hour(s)).  Radiology Reports Dg Chest 2 View (if Patient Has Fever And/or Copd)  11/03/2014   CLINICAL DATA:  Shortness of breath and abdominal pain.  EXAM: CHEST  2 VIEW  COMPARISON:  None  FINDINGS: The heart size and mediastinal contours are within normal limits. Both lungs are clear. The visualized skeletal structures are unremarkable.  IMPRESSION: No active cardiopulmonary disease.   Electronically Signed   By: Signa Kell M.D.   On: 11/03/2014 18:27    CBC  Recent Labs Lab 11/03/14 1800 11/03/14 2303 11/04/14 0421 11/05/14 0347  WBC 6.9 8.0 7.2 5.4  HGB 11.4* 11.7* 11.0* 10.7*  HCT 36.5 37.7 35.0* 33.8*  PLT 315 330 260 249  MCV 69.9* 70.1* 69.9* 69.7*  MCH 21.8* 21.7* 22.0* 22.1*  MCHC 31.2 31.0 31.4 31.7  RDW 17.7* 17.9* 17.8* 17.8*  LYMPHSABS 2.1  --   --   --   MONOABS 0.6  --   --   --   EOSABS 0.1  --   --   --   BASOSABS 0.0  --   --   --     Chemistries   Recent Labs Lab 11/03/14 1800 11/03/14 2303 11/04/14 0421 11/04/14 1843 11/05/14 0347  NA 141  --  139  --  139  K 4.0  --  3.7  --  4.1  CL 107  --  103  --  103  CO2 25  --  22  --  27  GLUCOSE 91  --  74  --  88  BUN 10  --  10  --  14  CREATININE 1.23* 1.27* 1.15*  --  1.04*  CALCIUM 9.0  --  8.6*  --  8.2*  MG  --   --   --  1.6*  --   AST 30  --   --   --   --   ALT 18  --   --   --   --   ALKPHOS 76  --   --   --   --  BILITOT 1.3*  --   --   --   --     ------------------------------------------------------------------------------------------------------------------ estimated creatinine clearance is 85.4 mL/min (by C-G formula based on Cr of 1.04). ------------------------------------------------------------------------------------------------------------------ No results for input(s): HGBA1C in the last 72 hours. ------------------------------------------------------------------------------------------------------------------  Recent Labs  11/05/14 0347  CHOL 121  HDL 26*  LDLCALC 79  TRIG 81  CHOLHDL 4.7   ------------------------------------------------------------------------------------------------------------------  Recent Labs  11/04/14 1843  TSH 1.192   ------------------------------------------------------------------------------------------------------------------  Recent Labs  11/03/14 2302 11/03/14 2303  VITAMINB12  --  846  FOLATE 31.0  --   FERRITIN  --  10*  TIBC  --  398  IRON  --  25*  RETICCTPCT  --  1.3    Coagulation profile No results for input(s): INR, PROTIME in the last 168 hours.  No results for input(s): DDIMER in the last 72 hours.  Cardiac Enzymes  Recent Labs Lab 11/03/14 2303 11/04/14 0421 11/04/14 1120  TROPONINI 0.03 0.03 0.03   ------------------------------------------------------------------------------------------------------------------ Invalid input(s): POCBNP    Leopold Smyers D.O. on 11/05/2014 at 10:34 AM  Between 7am to 7pm - Pager - [phone]  After 7pm go to [email or web address] - password [email or web address]  And look for the night coverage person covering for me after hours  [facility] Office  [phone]

## 2014-11-05 NOTE — Progress Notes (Signed)
Patient ID: Abigail Sharp, female   DOB: [birth year], 49 y.o.   MRN: [id number]    Primary cardiologist:  Subjective:    SOB and swelling is improving  Objective:   Temp:  [97.7 F (36.5 C)-98.2 F (36.8 C)] 98 F (36.7 C) (06/04 0500) Pulse Rate:  [76-87] 87 (06/04 0500) Resp:  [18-20] 18 (06/04 0500) BP: (115-147)/(59-89) 118/64 mmHg (06/04 0922) SpO2:  [99 %-100 %] 100 % (06/04 0500) Weight:  [247 lb 3.2 oz (112.129 kg)] 247 lb 3.2 oz (112.129 kg) (06/04 0500)    Filed Weights   11/03/14 1723 11/04/14 0626 11/05/14 0500  Weight: 259 lb (117.482 kg) 253 lb 11.2 oz (115.078 kg) 247 lb 3.2 oz (112.129 kg)    Intake/Output Summary (Last 24 hours) at 11/05/14 1203 Last data filed at 11/05/14 1014  Gross per 24 hour  Intake    480 ml  Output   5100 ml  Net  -4620 ml    Telemetry: NSR  Exam:  General: NAD   Resp: CTAB  Cardiac: RRR, no m/r/g, no JVD  GI: abdomen soft, NT, ND  MSK: 1-2 + bilateral edema  Neuro: no focal deficits   Lab Results:  Basic Metabolic Panel:  Recent Labs Lab 11/03/14 1800 11/03/14 2303 11/04/14 0421 11/04/14 1843 11/05/14 0347  NA 141  --  139  --  139  K 4.0  --  3.7  --  4.1  CL 107  --  103  --  103  CO2 25  --  22  --  27  GLUCOSE 91  --  74  --  88  BUN 10  --  10  --  14  CREATININE 1.23* 1.27* 1.15*  --  1.04*  CALCIUM 9.0  --  8.6*  --  8.2*  MG  --   --   --  1.6*  --     Liver Function Tests:  Recent Labs Lab 11/03/14 1800  AST 30  ALT 18  ALKPHOS 76  BILITOT 1.3*  PROT 7.5  ALBUMIN 3.5    CBC:  Recent Labs Lab 11/03/14 2303 11/04/14 0421 11/05/14 0347  WBC 8.0 7.2 5.4  HGB 11.7* 11.0* 10.7*  HCT 37.7 35.0* 33.8*  MCV 70.1* 69.9* 69.7*  PLT 330 260 249    Cardiac Enzymes:  Recent Labs Lab 11/03/14 2303 11/04/14 0421 11/04/14 1120  TROPONINI 0.03 0.03 0.03    BNP: No results for input(s): PROBNP in the last 8760 hours.  Coagulation: No results for input(s): INR in the last 168  hours.  ECG:   Medications:   Scheduled Medications: . aspirin EC  81 mg Oral Daily  . carvedilol  3.125 mg Oral BID WC  . enoxaparin (LOVENOX) injection  40 mg Subcutaneous Q24H  . furosemide  40 mg Intravenous Daily  . lisinopril  5 mg Oral Daily  . sodium chloride  3 mL Intravenous Q12H     Infusions:     PRN Medications:  sodium chloride, acetaminophen, ondansetron (ZOFRAN) IV, sodium chloride     Assessment/Plan    1. Acute systolic HF - echo LVEF 25%, diffuse hypokinesis, abnormal diastolic function, mod to severe MR, mod to severe RV dysfunction. PASP 40 - she is on lasix  IV daily, negative 4.1 liters yesterday, negative 7.5 liters since admission.  Cr currently trending down consistent with venous congestion and CHF. Very high output with lasix  IV daily, received a dose this AM as well. Will d/c scheduled  lasix and reevaluate tomorrow for apporpriate dosing.  - she is on coreg and lisinopril - TSH 1.192 - plan for LHC/RHC on Monday.       Dina Rich, M.D.

## 2014-11-05 NOTE — Plan of Care (Signed)
Problem: Food- and Nutrition-Related Knowledge Deficit (NB-1.1) Goal: Nutrition education Formal process to instruct or train a patient/client in a skill or to impart knowledge to help patients/clients voluntarily manage or modify food choices and eating behavior to maintain or improve health. Outcome: Completed/Met Date Met:  11/05/14 Nutrition Education Note  RD consulted for nutrition education regarding new onset CHF.  RD provided "Low Sodium Nutrition Therapy" handout from the [employer]. Reviewed patient's dietary recall. Provided examples on ways to decrease sodium intake in diet. Discouraged intake of processed foods and use of salt shaker. Encouraged fresh fruits and vegetables as well as whole grain sources of carbohydrates to maximize fiber intake.   RD discussed why it is important for patient to adhere to diet recommendations, and emphasized the role of fluids, foods to avoid, and importance of weighing self daily. Teach back method used.  Expect fair compliance.  Body mass index is 38.71 kg/(m^2). Pt meets criteria for class 2 obesity based on current BMI.  Current diet order is heart healthy, 2 gm sodium, patient is consuming approximately 100% of meals at this time. Labs and medications reviewed. No further nutrition interventions warranted at this time. RD contact information provided. If additional nutrition issues arise, please re-consult RD.   Molli Barrows, RD, LDN, [place] Pager [phone] After Hours Pager [phone]

## 2014-11-06 DIAGNOSIS — I5033 Acute on chronic diastolic (congestive) heart failure: Secondary | ICD-10-CM | POA: Insufficient documentation

## 2014-11-06 DIAGNOSIS — I5023 Acute on chronic systolic (congestive) heart failure: Secondary | ICD-10-CM | POA: Insufficient documentation

## 2014-11-06 DIAGNOSIS — I509 Heart failure, unspecified: Secondary | ICD-10-CM | POA: Insufficient documentation

## 2014-11-06 LAB — BASIC METABOLIC PANEL
Anion gap: 7 (ref 5–15)
BUN: 18 mg/dL (ref 6–20)
CALCIUM: 8.5 mg/dL — AB (ref 8.9–10.3)
CHLORIDE: 103 mmol/L (ref 101–111)
CO2: 30 mmol/L (ref 22–32)
Creatinine, Ser: 1.21 mg/dL — ABNORMAL HIGH (ref 0.44–1.00)
GFR calc Af Amer: >60 mL/min (ref >60)
GFR calc non Af Amer: 52 mL/min — ABNORMAL LOW (ref >60)
GLUCOSE: 96 mg/dL (ref 65–99)
POTASSIUM: 4.3 mmol/L (ref 3.5–5.1)
Sodium: 140 mmol/L (ref 135–145)

## 2014-11-06 LAB — MAGNESIUM: MAGNESIUM: 1.9 mg/dL (ref 1.7–2.4)

## 2014-11-06 MED ORDER — BENZONATATE 100 MG PO CAPS
100.0000 mg | ORAL_CAPSULE | Freq: Two times a day (BID) | ORAL | Status: DC | PRN
  Administered 2014-11-06: 100 mg via ORAL
  Filled 2014-11-06 (×2): qty 1

## 2014-11-06 MED ORDER — FERROUS SULFATE 325 (65 FE) MG PO TABS
325.0000 mg | ORAL_TABLET | Freq: Two times a day (BID) | ORAL | Status: DC
  Administered 2014-11-06 – 2014-11-07 (×3): 325 mg via ORAL
  Filled 2014-11-06 (×4): qty 1

## 2014-11-06 MED ORDER — FUROSEMIDE 10 MG/ML IJ SOLN
20.0000 mg | Freq: Once | INTRAMUSCULAR | Status: AC
  Administered 2014-11-06: 20 mg via INTRAVENOUS
  Filled 2014-11-06: qty 2

## 2014-11-06 MED ORDER — SALINE SPRAY 0.65 % NA SOLN
1.0000 | NASAL | Status: DC | PRN
  Administered 2014-11-06: 1 via NASAL
  Filled 2014-11-06: qty 44

## 2014-11-06 NOTE — Progress Notes (Signed)
Patient ID: Abigail Sharp, female   DOB: [birth year], 49 y.o.   MRN: [id number]    Subjective:    + orthopnea overnight. SOB improving.   Objective:   Temp:  [97.1 F (36.2 C)-98.4 F (36.9 C)] 98.4 F (36.9 C) (06/05 0530) Pulse Rate:  [69-75] 75 (06/05 0923) Resp:  [18-20] 18 (06/05 0530) BP: (106-122)/(67-77) 122/77 mmHg (06/05 0923) SpO2:  [97 %-100 %] 97 % (06/05 0530) Weight:  [246 lb 9.6 oz (111.857 kg)] 246 lb 9.6 oz (111.857 kg) (06/05 0530) Last BM Date: 11/05/14  Filed Weights   11/04/14 0626 11/05/14 0500 11/06/14 0530  Weight: 253 lb 11.2 oz (115.078 kg) 247 lb 3.2 oz (112.129 kg) 246 lb 9.6 oz (111.857 kg)    Intake/Output Summary (Last 24 hours) at 11/06/14 1020 Last data filed at 11/06/14 0848  Gross per 24 hour  Intake   1320 ml  Output   2550 ml  Net  -1230 ml    Exam:  General: NAD  Resp: CTAB  Cardiac: RRR, no m/r/g, no JVD  GI: abdomen soft, NT, ND  MSK: 2+ bilateral LE edema  Neuro: no focal deficits   Lab Results:  Basic Metabolic Panel:  Recent Labs Lab 11/04/14 0421 11/04/14 1843 11/05/14 0347 11/06/14 0537  NA 139  --  139 140  K 3.7  --  4.1 4.3  CL 103  --  103 103  CO2 22  --  27 30  GLUCOSE 74  --  88 96  BUN 10  --  14 18  CREATININE 1.15*  --  1.04* 1.21*  CALCIUM 8.6*  --  8.2* 8.5*  MG  --  1.6*  --  1.9    Liver Function Tests:  Recent Labs Lab 11/03/14 1800  AST 30  ALT 18  ALKPHOS 76  BILITOT 1.3*  PROT 7.5  ALBUMIN 3.5    CBC:  Recent Labs Lab 11/03/14 2303 11/04/14 0421 11/05/14 0347  WBC 8.0 7.2 5.4  HGB 11.7* 11.0* 10.7*  HCT 37.7 35.0* 33.8*  MCV 70.1* 69.9* 69.7*  PLT 330 260 249    Cardiac Enzymes:  Recent Labs Lab 11/03/14 2303 11/04/14 0421 11/04/14 1120  TROPONINI 0.03 0.03 0.03    BNP: No results for input(s): PROBNP in the last 8760 hours.  Coagulation: No results for input(s): INR in the last 168 hours.  ECG:   Medications:   Scheduled Medications: .  aspirin EC  81 mg Oral Daily  . carvedilol  3.125 mg Oral BID WC  . enoxaparin (LOVENOX) injection  40 mg Subcutaneous Q24H  . lisinopril  5 mg Oral Daily  . sodium chloride  3 mL Intravenous Q12H     Infusions:     PRN Medications:  sodium chloride, acetaminophen, ondansetron (ZOFRAN) IV, sodium chloride, sodium chloride     Assessment/Plan    1. Acute systolic HF - echo LVEF 25%, diffuse hypokinesis, abnormal diastolic function, mod to severe MR, mod to severe RV dysfunction. PASP 40 - she is negative 2 liters yesterday, negative 8.7 liters since admission. She has been diuresing very well on lasix  IV daily. Mild uptrend in Cr today, will dose IV lasix  once today. I have been dosing her daily af ter evaluation her as opposed to scheduled diuretics - she is on coreg and lisinopril - TSH 1.192 - we discussed indications for LHC/RHC, and the options of inpatient vs outpatient testing. She is in favor on inpatient testing and we will  arrange for tomorrow. Will make NPO at midnight, lovenox at night only with no AM dose ordered.        Dina Rich, M.D.

## 2014-11-06 NOTE — Progress Notes (Signed)
[facility]                                                                              Patient Demographics  Abigail Sharp, is a 49 y.o. female, DOB - 19-May-1966, WGN:[id number]  Admit date - 11/03/2014   Admitting Physician Haydee Monica, MD  Outpatient Primary MD for the patient is No primary care provider on file.  LOS - 3   Chief Complaint  Patient presents with  . Shortness of Breath  . Abdominal Pain      HPI on 11/03/2014 by Dr. Tarry Kos 49 yo healthy female with 3 weeks of progressive worsening sob especially with exertion associated with PND, LE and pannus swelling and cough when lying flat. Dyspnea is worse when lying flat and trying to sleep at night. No fevers. Cough nonproductive. She has no idea what her baseline weight is but knows she has gained significant weight over the last month. No chest pain. Pt had to walk over a mile to urgent care center today because the battery in her car went out. After evaluation at urgent care, she was transported to [facility] for evaluation of new chf. She was very sob when she walked today, but experienced no chest pain. She is sunburned from the walk. She also says she has discomfort in her pannus from the swelling, but no abdominal pain. She does not drink alcohol. She has no cardiac history or problems with her liver. No n/v/d. She takes no daily medications. Denies any melena or brbpr.  Assessment & Plan   Dyspnea secondary Acute combined systolic and diastolic heart failure -Patient had episode of orthopnea overnight, but feels improved now -BNP 1168 -Echocardiogram EF25%, diffuse hypokinesis, left ventricular diastolic dysfunction -Continue Lasix- cardiology decreased dose, as patient's creatinine had small bump -Continue to monitor daily weights, intake and output -Urine output over the past 24 hours: 3250 mL  -Weight down 6Kg since atdmission -Chest x-ray: No active cardiopulmonary disease -Troponin  negative -Cardiology consulted and appreciated, will likely need cath or nuclear study to exclude CAD -Continue coreg, lisinopril, lasix, aspirin -Lipid panel: Total cholesterol 121, triglycerides 81, HDL 26, LDL 79  Microcytic anemia -Unknown baseline -Anemia panel: Iron 25, ferritin 10 -Denies melana -Hemoglobin currently stable, 10.7 -Will place patient on iron supplementation  Uncontrolled hypertension -Patient started on lisinopril, coreg, as well as receiving Lasix -Blood pressure under better control  Mild renal insufficiency -Unknown baseline creatinine -Creatinine currently improving, 1.21  Hypomagnesemia -Replaced, Magnesium 1.9 today  Code Status: Full  Family Communication: None at bedside  Disposition Plan: Admitted. Pending left/right heart cath on 11/07/2014.  Time Spent in minutes   30 minutes  Procedures  Echocardiogram  Consults   Cardiology  DVT Prophylaxis  Lovenox  Lab Results  Component Value Date   PLT 249 11/05/2014    Medications  Scheduled Meds: . aspirin EC  81 mg Oral Daily  . carvedilol  3.125 mg Oral BID WC  . enoxaparin (LOVENOX) injection  40 mg Subcutaneous Q24H  . furosemide  20 mg Intravenous Once  . lisinopril  5 mg Oral Daily  . sodium chloride  3 mL Intravenous Q12H   Continuous  Infusions:  PRN Meds:.sodium chloride, acetaminophen, ondansetron (ZOFRAN) IV, sodium chloride, sodium chloride  Antibiotics    Anti-infectives    None        Subjective:   [name] seen and examined today.  Patient stated that overnight she felt short of breath with lying down. Currently she feels her breathing has improved. She denies any chest pain, palpitations, dizziness, headache, abdominal pain, nausea or vomiting.  Objective:   Filed Vitals:   11/05/14 1300 11/05/14 2051 11/06/14 0530 11/06/14 0923  BP: 120/67 106/68 115/75 122/77  Pulse: 69 70 71 75  Temp: 97.1 F (36.2 C) 98.1 F (36.7 C) 98.4 F (36.9 C)     TempSrc: Oral Oral Oral   Resp: 20 20 18    Height:      Weight:   111.857 kg (246 lb 9.6 oz)   SpO2: 100% 97% 97%     Wt Readings from Last 3 Encounters:  11/06/14 111.857 kg (246 lb 9.6 oz)     Intake/Output Summary (Last 24 hours) at 11/06/14 1045 Last data filed at 11/06/14 0848  Gross per 24 hour  Intake   1320 ml  Output   2550 ml  Net  -1230 ml    Exam  General: Well developed, well nourished, no apparent distress  HEENT: NCAT, mucous membranes moist.   Cardiovascular: S1 S2 auscultated, no murmurs, RRR  Respiratory: Clear to auscultation bilaterally  Abdomen: Soft, obese, nontender, nondistended, + bowel sounds  Extremities: warm dry without cyanosis clubbing. +1 edema in LE  Psych: Anxious, but appropriate  Data Review   Micro Results No results found for this or any previous visit (from the past 240 hour(s)).  Radiology Reports Dg Chest 2 View (if Patient Has Fever And/or Copd)  11/03/2014   CLINICAL DATA:  Shortness of breath and abdominal pain.  EXAM: CHEST  2 VIEW  COMPARISON:  None  FINDINGS: The heart size and mediastinal contours are within normal limits. Both lungs are clear. The visualized skeletal structures are unremarkable.  IMPRESSION: No active cardiopulmonary disease.   Electronically Signed   By: Signa Kell M.D.   On: 11/03/2014 18:27    CBC  Recent Labs Lab 11/03/14 1800 11/03/14 2303 11/04/14 0421 11/05/14 0347  WBC 6.9 8.0 7.2 5.4  HGB 11.4* 11.7* 11.0* 10.7*  HCT 36.5 37.7 35.0* 33.8*  PLT 315 330 260 249  MCV 69.9* 70.1* 69.9* 69.7*  MCH 21.8* 21.7* 22.0* 22.1*  MCHC 31.2 31.0 31.4 31.7  RDW 17.7* 17.9* 17.8* 17.8*  LYMPHSABS 2.1  --   --   --   MONOABS 0.6  --   --   --   EOSABS 0.1  --   --   --   BASOSABS 0.0  --   --   --     Chemistries   Recent Labs Lab 11/03/14 1800 11/03/14 2303 11/04/14 0421 11/04/14 1843 11/05/14 0347 11/06/14 0537  NA 141  --  139  --  139 140  K 4.0  --  3.7  --  4.1 4.3  CL  107  --  103  --  103 103  CO2 25  --  22  --  27 30  GLUCOSE 91  --  74  --  88 96  BUN 10  --  10  --  14 18  CREATININE 1.23* 1.27* 1.15*  --  1.04* 1.21*  CALCIUM 9.0  --  8.6*  --  8.2* 8.5*  MG  --   --   --  1.6*  --  1.9  AST 30  --   --   --   --   --   ALT 18  --   --   --   --   --   ALKPHOS 76  --   --   --   --   --   BILITOT 1.3*  --   --   --   --   --    ------------------------------------------------------------------------------------------------------------------ estimated creatinine clearance is 73.3 mL/min (by C-G formula based on Cr of 1.21). ------------------------------------------------------------------------------------------------------------------ No results for input(s): HGBA1C in the last 72 hours. ------------------------------------------------------------------------------------------------------------------  Recent Labs  11/05/14 0347  CHOL 121  HDL 26*  LDLCALC 79  TRIG 81  CHOLHDL 4.7   ------------------------------------------------------------------------------------------------------------------  Recent Labs  11/04/14 1843  TSH 1.192   ------------------------------------------------------------------------------------------------------------------  Recent Labs  11/03/14 2302 11/03/14 2303  VITAMINB12  --  846  FOLATE 31.0  --   FERRITIN  --  10*  TIBC  --  398  IRON  --  25*  RETICCTPCT  --  1.3    Coagulation profile No results for input(s): INR, PROTIME in the last 168 hours.  No results for input(s): DDIMER in the last 72 hours.  Cardiac Enzymes  Recent Labs Lab 11/03/14 2303 11/04/14 0421 11/04/14 1120  TROPONINI 0.03 0.03 0.03   ------------------------------------------------------------------------------------------------------------------ Invalid input(s): POCBNP    Haileigh Pitz D.O. on 11/06/2014 at 10:45 AM  Between 7am to 7pm - Pager - [phone]  After 7pm go to [email or web address] - password  [email or web address]  And look for the night coverage person covering for me after hours  [facility] Office  [phone]

## 2014-11-06 NOTE — Progress Notes (Signed)
Cardiac cath video show to patient, patient verbalized understanding, no question asked.

## 2014-11-06 NOTE — Progress Notes (Signed)
Patient alert oriented, denies pain, no shortness of breath. V/S stable  98.3, SR 75, 122/71, 100%/RA, R. 20. Will continue to monitor patient.

## 2014-11-07 ENCOUNTER — Encounter (HOSPITAL_COMMUNITY)
Admission: EM | Disposition: A | Discharge status: Home / Self Care | Payer: No Typology Code available for payment source | Source: Home / Self Care | Attending: Internal Medicine

## 2014-11-07 DIAGNOSIS — I5041 Acute combined systolic (congestive) and diastolic (congestive) heart failure: Principal | ICD-10-CM

## 2014-11-07 DIAGNOSIS — I429 Cardiomyopathy, unspecified: Secondary | ICD-10-CM

## 2014-11-07 HISTORY — PX: CARDIAC CATHETERIZATION: SHX172

## 2014-11-07 LAB — CBC
HCT: 35.5 % — ABNORMAL LOW (ref 36.0–46.0)
HEMOGLOBIN: 10.7 g/dL — AB (ref 12.0–15.0)
MCH: 21.1 pg — AB (ref 26.0–34.0)
MCHC: 30.1 g/dL (ref 30.0–36.0)
MCV: 69.9 fL — AB (ref 78.0–100.0)
PLATELETS: 272 10*3/uL (ref 150–400)
RBC: 5.08 MIL/uL (ref 3.87–5.11)
RDW: 17.7 % — ABNORMAL HIGH (ref 11.5–15.5)
WBC: 5.8 10*3/uL (ref 4.0–10.5)

## 2014-11-07 LAB — BASIC METABOLIC PANEL
ANION GAP: 8 (ref 5–15)
BUN: 17 mg/dL (ref 6–20)
CO2: 27 mmol/L (ref 22–32)
Calcium: 8.3 mg/dL — ABNORMAL LOW (ref 8.9–10.3)
Chloride: 103 mmol/L (ref 101–111)
Creatinine, Ser: 1.08 mg/dL — ABNORMAL HIGH (ref 0.44–1.00)
GFR calc Af Amer: >60 mL/min (ref >60)
GFR, EST NON AFRICAN AMERICAN: 60 mL/min — AB (ref >60)
Glucose, Bld: 107 mg/dL — ABNORMAL HIGH (ref 65–99)
Potassium: 4.1 mmol/L (ref 3.5–5.1)
Sodium: 138 mmol/L (ref 135–145)

## 2014-11-07 LAB — PROTIME-INR
INR: 1.17 (ref 0.00–1.49)
Prothrombin Time: 15.1 seconds (ref 11.6–15.2)

## 2014-11-07 LAB — HEMOGLOBIN A1C
HEMOGLOBIN A1C: 5.7 % — AB (ref 4.8–5.6)
Mean Plasma Glucose: 117 mg/dL

## 2014-11-07 SURGERY — RIGHT/LEFT HEART CATH AND CORONARY ANGIOGRAPHY

## 2014-11-07 MED ORDER — SODIUM CHLORIDE 0.9 % IJ SOLN
3.0000 mL | Freq: Two times a day (BID) | INTRAMUSCULAR | Status: DC
  Administered 2014-11-07 – 2014-11-09 (×4): 3 mL via INTRAVENOUS

## 2014-11-07 MED ORDER — SODIUM CHLORIDE 0.9 % WEIGHT BASED INFUSION
1.0000 mL/kg/h | INTRAVENOUS | Status: AC
  Administered 2014-11-07: 1 mL/kg/h via INTRAVENOUS

## 2014-11-07 MED ORDER — FENTANYL CITRATE (PF) 100 MCG/2ML IJ SOLN
INTRAMUSCULAR | Status: AC
  Filled 2014-11-07: qty 2

## 2014-11-07 MED ORDER — MIDAZOLAM HCL 2 MG/2ML IJ SOLN
INTRAMUSCULAR | Status: DC | PRN
  Administered 2014-11-07: 1 mg via INTRAVENOUS

## 2014-11-07 MED ORDER — SODIUM CHLORIDE 0.9 % IJ SOLN: 3.0000 mL | Freq: Two times a day (BID) | INTRAMUSCULAR | Status: DC

## 2014-11-07 MED ORDER — SODIUM CHLORIDE 0.9 % IV SOLN: 250.0000 mL | INTRAVENOUS | Status: DC | PRN

## 2014-11-07 MED ORDER — FERROUS SULFATE 325 (65 FE) MG PO TABS
325.0000 mg | ORAL_TABLET | Freq: Two times a day (BID) | ORAL | Status: DC
  Administered 2014-11-08 – 2014-11-09 (×4): 325 mg via ORAL
  Filled 2014-11-07 (×5): qty 1

## 2014-11-07 MED ORDER — MORPHINE SULFATE 2 MG/ML IJ SOLN: 2.0000 mg | INTRAMUSCULAR | Status: DC | PRN

## 2014-11-07 MED ORDER — ACETAMINOPHEN 325 MG PO TABS
650.0000 mg | ORAL_TABLET | ORAL | Status: DC | PRN
  Administered 2014-11-07 – 2014-11-08 (×2): 650 mg via ORAL
  Filled 2014-11-07 (×2): qty 2

## 2014-11-07 MED ORDER — SODIUM CHLORIDE 0.9 % IJ SOLN: 3.0000 mL | INTRAMUSCULAR | Status: DC | PRN

## 2014-11-07 MED ORDER — HEPARIN (PORCINE) IN NACL 2-0.9 UNIT/ML-% IJ SOLN
INTRAMUSCULAR | Status: AC
  Filled 2014-11-07: qty 1000

## 2014-11-07 MED ORDER — ASPIRIN 81 MG PO CHEW: 81.0000 mg | CHEWABLE_TABLET | ORAL | Status: DC

## 2014-11-07 MED ORDER — IOHEXOL 350 MG/ML SOLN
INTRAVENOUS | Status: DC | PRN
  Administered 2014-11-07: 30 mL via INTRA_ARTERIAL

## 2014-11-07 MED ORDER — ONDANSETRON HCL 4 MG/2ML IJ SOLN: 4.0000 mg | Freq: Four times a day (QID) | INTRAMUSCULAR | Status: DC | PRN

## 2014-11-07 MED ORDER — MIDAZOLAM HCL 2 MG/2ML IJ SOLN
INTRAMUSCULAR | Status: AC
  Filled 2014-11-07: qty 2

## 2014-11-07 MED ORDER — NITROGLYCERIN 1 MG/10 ML FOR IR/CATH LAB
INTRA_ARTERIAL | Status: AC
  Filled 2014-11-07: qty 10

## 2014-11-07 MED ORDER — FENTANYL CITRATE (PF) 100 MCG/2ML IJ SOLN
INTRAMUSCULAR | Status: DC | PRN
  Administered 2014-11-07: 25 ug via INTRAVENOUS

## 2014-11-07 MED ORDER — ASPIRIN 81 MG PO CHEW: 81.0000 mg | CHEWABLE_TABLET | Freq: Every day | ORAL | Status: DC

## 2014-11-07 MED ORDER — SODIUM CHLORIDE 0.9 % IV SOLN
INTRAVENOUS | Status: DC
  Administered 2014-11-07: 06:00:00 via INTRAVENOUS

## 2014-11-07 MED ORDER — LIDOCAINE HCL (PF) 1 % IJ SOLN
INTRAMUSCULAR | Status: DC | PRN
  Administered 2014-11-07: 30 mL via SUBCUTANEOUS

## 2014-11-07 MED ORDER — LIDOCAINE HCL (PF) 1 % IJ SOLN
INTRAMUSCULAR | Status: AC
  Filled 2014-11-07: qty 30

## 2014-11-07 SURGICAL SUPPLY — 11 items
CATH INFINITI 5FR MULTPACK ANG (CATHETERS) ×2 IMPLANT
CATH SWAN GANZ 7F STRAIGHT (CATHETERS) ×2 IMPLANT
DEVICE CLOSURE MYNXGRIP 5F (Vascular Products) ×2 IMPLANT
KIT HEART LEFT (KITS) ×2 IMPLANT
KIT HEART RIGHT NAMIC (KITS) ×2 IMPLANT
PACK CARDIAC CATHETERIZATION (CUSTOM PROCEDURE TRAY) ×2 IMPLANT
SHEATH PINNACLE 5F 10CM (SHEATH) ×2 IMPLANT
SHEATH PINNACLE 7F 10CM (SHEATH) ×2 IMPLANT
TRANSDUCER W/STOPCOCK (MISCELLANEOUS) ×4 IMPLANT
WIRE EMERALD 3MM-J .025X260CM (WIRE) ×2 IMPLANT
WIRE EMERALD 3MM-J .035X150CM (WIRE) ×2 IMPLANT

## 2014-11-07 NOTE — Interval H&P Note (Signed)
Cath Lab Visit (complete for each Cath Lab visit)  Clinical Evaluation Leading to the Procedure:   ACS: No.  Non-ACS:    Anginal Classification: CCS III  Anti-ischemic medical therapy: No Therapy  Non-Invasive Test Results: No non-invasive testing performed  Prior CABG: No previous CABG      History and Physical Interval Note:  11/07/2014 3:07 PM  Abigail Sharp  has presented today for surgery, with the diagnosis of unstable angina  The various methods of treatment have been discussed with the patient and family. After consideration of risks, benefits and other options for treatment, the patient has consented to  Procedure(s): Left Heart Cath and Coronary Angiography (N/A) as a surgical intervention .  The patient's history has been reviewed, patient examined, no change in status, stable for surgery.  I have reviewed the patient's chart and labs.  Questions were answered to the patient's satisfaction.     Nanetta Batty

## 2014-11-07 NOTE — H&P (View-Only) (Signed)
  Subjective: Feeling a lot better.  No orthopnea.  Objective: Vital signs in last 24 hours: Temp:  [97.7 F (36.5 C)-98.2 F (36.8 C)] 98.2 F (36.8 C) (06/06 0500) Pulse Rate:  [71-88] 88 (06/06 1124) Resp:  [20] 20 (06/06 0500) BP: (103-128)/(50-75) 128/60 mmHg (06/06 1122) SpO2:  [99 %-100 %] 99 % (06/06 0500) Weight:  [245 lb 8 oz (111.358 kg)] 245 lb 8 oz (111.358 kg) (06/06 0500) Last BM Date: 11/05/14  Intake/Output from previous day: 06/05 0701 - 06/06 0700 In: 1242 [P.O.:1242] Out: 3350 [Urine:3350] Intake/Output this shift: Total I/O In: 0  Out: 650 [Urine:650]  Medications Scheduled Meds: . [START ON 11/08/2014] aspirin  81 mg Oral Pre-Cath  . aspirin EC  81 mg Oral Daily  . carvedilol  3.125 mg Oral BID WC  . enoxaparin (LOVENOX) injection  40 mg Subcutaneous Q24H  . ferrous sulfate  325 mg Oral BID WC  . lisinopril  5 mg Oral Daily  . sodium chloride  3 mL Intravenous Q12H  . sodium chloride  3 mL Intravenous Q12H   Continuous Infusions: . [START ON 11/08/2014] sodium chloride 10 mL/hr at 11/07/14 0610   PRN Meds:.sodium chloride, sodium chloride, acetaminophen, benzonatate, ondansetron (ZOFRAN) IV, sodium chloride, sodium chloride, sodium chloride  PE: General appearance: alert, cooperative and no distress Neck: JVD appears elevated Lungs: clear to auscultation bilaterally Heart: regular rate and rhythm, S1, S2 normal, no murmur, click, rub or gallop Abdomen: +BS, nontender Extremities: Trace LEE Pulses: 2+ and symmetric Skin: Warm and dry Neurologic: Grossly normal  Lab Results:   Recent Labs  11/05/14 0347 11/07/14 0322  WBC 5.4 5.8  HGB 10.7* 10.7*  HCT 33.8* 35.5*  PLT 249 272   BMET  Recent Labs  11/05/14 0347 11/06/14 0537 11/07/14 0322  NA 139 140 138  K 4.1 4.3 4.1  CL 103 103 103  CO2 27 30 27  GLUCOSE 88 96 107*  BUN 14 18 17  CREATININE 1.04* 1.21* 1.08*  CALCIUM 8.2* 8.5* 8.3*   PT/INR  Recent Labs   11/07/14 0322  LABPROT 15.1  INR 1.17   Cholesterol  Recent Labs  11/05/14 0347  CHOL 121   Lipid Panel     Component Value Date/Time   CHOL 121 11/05/2014 0347   TRIG 81 11/05/2014 0347   HDL 26* 11/05/2014 0347   CHOLHDL 4.7 11/05/2014 0347   VLDL 16 11/05/2014 0347   LDLCALC 79 11/05/2014 0347    Assessment/Plan   Principal Problem:   Acute combined systolic and diastolic HF (heart failure), NYHA class 2 Feels a lot better.  No orthopnea.  Net fluids:  -2.1L/-11.0L.  SCr improved.  I think she has more fluid to diurese and continues to do so.  Last dose of Lasix was yesterday at ~0900hrs.  Currently discontinued.  We'll see what her pressures are during caths.  May need more lasix.      SOB (shortness of breath)   Uncontrolled hypertension  Stable and controlled.  Coreg 3.125mg BID, lisinopril 5mg.   Microcytic anemia  Stable Hgb   Renal insufficiency, mild  Stable   Cardiomyopathy, EF 25% on Echo  Diffuse hypokinesis.  R/L heart caths today.      LOS: 4 days    HAGER, BRYAN PA-C 11/07/2014 12:09 PM  As above, patient seen and examined. Her congestive heart failure symptoms are much improved. Continue ACE inhibitor and beta blocker. Add Lasix 20 mg daily and spironolactone 25 mg daily.   Follow potassium and renal function. Titrate medications following discharge.  Proceed with right and left cardiac catheterization today. The risks and benefits were discussed and the patient agrees to proceed. Patient will require repeat echocardiogram in 3 months to see if LV function has improved on medications. She is noted to have a microcytic anemia but still menstruates. Chun Sellen  

## 2014-11-07 NOTE — Progress Notes (Signed)
[facility]                                                                              Patient Demographics  Abigail Sharp, is a 49 y.o. female, DOB - March 26, 1966, QJF:[id number]  Admit date - 11/03/2014   Admitting Physician Haydee Monica, MD  Outpatient Primary MD for the patient is No primary care provider on file.  LOS - 4   Chief Complaint  Patient presents with  . Shortness of Breath  . Abdominal Pain      HPI on 11/03/2014 by Dr. Tarry Kos 49 yo healthy female with 3 weeks of progressive worsening sob especially with exertion associated with PND, LE and pannus swelling and cough when lying flat. Dyspnea is worse when lying flat and trying to sleep at night. No fevers. Cough nonproductive. She has no idea what her baseline weight is but knows she has gained significant weight over the last month. No chest pain. Pt had to walk over a mile to urgent care center today because the battery in her car went out. After evaluation at urgent care, she was transported to [place] for evaluation of new chf. She was very sob when she walked today, but experienced no chest pain. She is sunburned from the walk. She also says she has discomfort in her pannus from the swelling, but no abdominal pain. She does not drink alcohol. She has no cardiac history or problems with her liver. No n/v/d. She takes no daily medications. Denies any melena or brbpr.  Assessment & Plan   Dyspnea secondary Acute combined systolic and diastolic heart failure -Patient had episode of orthopnea overnight, but feels improved now -BNP 1168 -Echocardiogram EF25%, diffuse hypokinesis, left ventricular diastolic dysfunction -Continue Lasix- cardiology decreased dose, as patient's creatinine had small bump- however, improved today -Continue to monitor daily weights, intake and output -Urine output over the past 24 hours: 3350 mL / net 2108 -Weight down 6Kg since atdmission -Chest x-ray: No active  cardiopulmonary disease -Troponin negative -Cardiology consulted and appreciated, will likely need cath or nuclear study to exclude CAD -Continue coreg, lisinopril, lasix, aspirin -Lipid panel: Total cholesterol 121, triglycerides 81, HDL 26, LDL 79 -Plan for LHC/RHC today  Microcytic anemia -Unknown baseline -Anemia panel: Iron 25, ferritin 10 -Denies melana -Hemoglobin currently stable, 10.7 -Continue iron supplementation  Uncontrolled hypertension -Patient started on lisinopril, coreg, as well as receiving Lasix -Blood pressure under better control  Mild renal insufficiency -Unknown baseline creatinine -Creatinine currently improving, 1.08  Hypomagnesemia -Replaced, Magnesium 1.9   Code Status: Full  Family Communication: None at bedside. Attempted to call son (in [place]), no answer.  Disposition Plan: Admitted. Pending left/right heart cath today, 11/07/2014.  Time Spent in minutes   30 minutes  Procedures  Echocardiogram  Consults   Cardiology  DVT Prophylaxis  Lovenox  Lab Results  Component Value Date   PLT 272 11/07/2014    Medications  Scheduled Meds: . [START ON 11/08/2014] aspirin  81 mg Oral Pre-Cath  . aspirin EC  81 mg Oral Daily  . carvedilol  3.125 mg Oral BID WC  . enoxaparin (LOVENOX) injection  40 mg Subcutaneous Q24H  . ferrous  sulfate  325 mg Oral BID WC  . lisinopril  5 mg Oral Daily  . sodium chloride  3 mL Intravenous Q12H  . sodium chloride  3 mL Intravenous Q12H   Continuous Infusions: . [START ON 11/08/2014] sodium chloride 10 mL/hr at 11/07/14 0610   PRN Meds:.sodium chloride, sodium chloride, acetaminophen, benzonatate, ondansetron (ZOFRAN) IV, sodium chloride, sodium chloride, sodium chloride  Antibiotics    Anti-infectives    None        Subjective:   Abigail Sharp seen and examined today.  Patient states she is tired and not able to rest well in the hospital.  Although she complains of heaviness on her chest, she states  she is able to lay flat. She denies any chest pain, palpitations, dizziness, headache, abdominal pain, nausea or vomiting.  Objective:   Filed Vitals:   11/06/14 1448 11/06/14 1650 11/06/14 2040 11/07/14 0500  BP: 106/62 126/75 111/65 103/50  Pulse: 71 76 77 77  Temp: 97.8 F (36.6 C)  97.7 F (36.5 C) 98.2 F (36.8 C)  TempSrc: Oral  Oral Oral  Resp: Height:      Weight:    111.358 kg (245 lb 8 oz)  SpO2: 100%  100% 99%    Wt Readings from Last 3 Encounters:  11/07/14 111.358 kg (245 lb 8 oz)     Intake/Output Summary (Last 24 hours) at 11/07/14 0739 Last data filed at 11/07/14 0610  Gross per 24 hour  Intake   1242 ml  Output   3350 ml  Net  -2108 ml    Exam  General: Well developed, well nourished, NAD  HEENT: NCAT, mucous membranes moist.   Cardiovascular: S1 S2 auscultated, no murmurs, RRR  Respiratory: Clear to auscultation bilaterally  Abdomen: Soft, obese, nontender, nondistended, + bowel sounds  Extremities: warm dry without cyanosis clubbing. Trace edema in LE  Psych: Appropriate mood and affect  Data Review   Micro Results No results found for this or any previous visit (from the past 240 hour(s)).  Radiology Reports Dg Chest 2 View (if Patient Has Fever And/or Copd)  11/03/2014   CLINICAL DATA:  Shortness of breath and abdominal pain.  EXAM: CHEST  2 VIEW  COMPARISON:  None  FINDINGS: The heart size and mediastinal contours are within normal limits. Both lungs are clear. The visualized skeletal structures are unremarkable.  IMPRESSION: No active cardiopulmonary disease.   Electronically Signed   By: Signa Kell M.D.   On: 11/03/2014 18:27    CBC  Recent Labs Lab 11/03/14 1800 11/03/14 2303 11/04/14 0421 11/05/14 0347 11/07/14 0322  WBC 6.9 8.0 7.2 5.4 5.8  HGB 11.4* 11.7* 11.0* 10.7* 10.7*  HCT 36.5 37.7 35.0* 33.8* 35.5*  PLT 315 330 260 249 272  MCV 69.9* 70.1* 69.9* 69.7* 69.9*  MCH 21.8* 21.7* 22.0* 22.1* 21.1*    MCHC 31.2 31.0 31.4 31.7 30.1  RDW 17.7* 17.9* 17.8* 17.8* 17.7*  LYMPHSABS 2.1  --   --   --   --   MONOABS 0.6  --   --   --   --   EOSABS 0.1  --   --   --   --   BASOSABS 0.0  --   --   --   --     Chemistries   Recent Labs Lab 11/03/14 1800 11/03/14 2303 11/04/14 0421 11/04/14 1843 11/05/14 0347 11/06/14 0537 11/07/14 0322  NA 141  --  139  --  139 140 138  K 4.0  --  3.7  --  4.1 4.3 4.1  CL 107  --  103  --  103 103 103  CO2 25  --  22  --  GLUCOSE 91  --  74  --  88 96 107*  BUN 10  --  10  --  CREATININE 1.23* 1.27* 1.15*  --  1.04* 1.21* 1.08*  CALCIUM 9.0  --  8.6*  --  8.2* 8.5* 8.3*  MG  --   --   --  1.6*  --  1.9  --   AST 30  --   --   --   --   --   --   ALT 18  --   --   --   --   --   --   ALKPHOS 76  --   --   --   --   --   --   BILITOT 1.3*  --   --   --   --   --   --    ------------------------------------------------------------------------------------------------------------------ estimated creatinine clearance is 82 mL/min (by C-G formula based on Cr of 1.08). ------------------------------------------------------------------------------------------------------------------ No results for input(s): HGBA1C in the last 72 hours. ------------------------------------------------------------------------------------------------------------------  Recent Labs  11/05/14 0347  CHOL 121  HDL 26*  LDLCALC 79  TRIG 81  CHOLHDL 4.7   ------------------------------------------------------------------------------------------------------------------  Recent Labs  11/04/14 1843  TSH 1.192   ------------------------------------------------------------------------------------------------------------------ No results for input(s): VITAMINB12, FOLATE, FERRITIN, TIBC, IRON, RETICCTPCT in the last 72 hours.  Coagulation profile  Recent Labs Lab 11/07/14 0322  INR 1.17    No results for input(s): DDIMER in the last 72  hours.  Cardiac Enzymes  Recent Labs Lab 11/03/14 2303 11/04/14 0421 11/04/14 1120  TROPONINI 0.03 0.03 0.03   ------------------------------------------------------------------------------------------------------------------ Invalid input(s): POCBNP    Abigail Sharp D.O. on 11/07/2014 at 7:39 AM  Between 7am to 7pm - Pager - [phone]  After 7pm go to [email or web address] - password [email or web address]  And look for the night coverage person covering for me after hours  [facility] Office  [phone]

## 2014-11-07 NOTE — Progress Notes (Signed)
Subjective: Feeling a lot better.  No orthopnea.  Objective: Vital signs in last 24 hours: Temp:  [97.7 F (36.5 C)-98.2 F (36.8 C)] 98.2 F (36.8 C) (06/06 0500) Pulse Rate:  [71-88] 88 (06/06 1124) Resp:  [20] 20 (06/06 0500) BP: (103-128)/(50-75) 128/60 mmHg (06/06 1122) SpO2:  [99 %-100 %] 99 % (06/06 0500) Weight:  [245 lb 8 oz (111.358 kg)] 245 lb 8 oz (111.358 kg) (06/06 0500) Last BM Date: 11/05/14  Intake/Output from previous day: 06/05 0701 - 06/06 0700 In: 1242 [P.O.:1242] Out: 3350 [Urine:3350] Intake/Output this shift: Total I/O In: 0  Out: 650 [Urine:650]  Medications Scheduled Meds: . [START ON 11/08/2014] aspirin  81 mg Oral Pre-Cath  . aspirin EC  81 mg Oral Daily  . carvedilol  3.125 mg Oral BID WC  . enoxaparin (LOVENOX) injection  40 mg Subcutaneous Q24H  . ferrous sulfate  325 mg Oral BID WC  . lisinopril  5 mg Oral Daily  . sodium chloride  3 mL Intravenous Q12H  . sodium chloride  3 mL Intravenous Q12H   Continuous Infusions: . [START ON 11/08/2014] sodium chloride 10 mL/hr at 11/07/14 0610   PRN Meds:.sodium chloride, sodium chloride, acetaminophen, benzonatate, ondansetron (ZOFRAN) IV, sodium chloride, sodium chloride, sodium chloride  PE: General appearance: alert, cooperative and no distress Neck: JVD appears elevated Lungs: clear to auscultation bilaterally Heart: regular rate and rhythm, S1, S2 normal, no murmur, click, rub or gallop Abdomen: +BS, nontender Extremities: Trace LEE Pulses: 2+ and symmetric Skin: Warm and dry Neurologic: Grossly normal  Lab Results:   Recent Labs  11/05/14 0347 11/07/14 0322  WBC 5.4 5.8  HGB 10.7* 10.7*  HCT 33.8* 35.5*  PLT 249 272   BMET  Recent Labs  11/05/14 0347 11/06/14 0537 11/07/14 0322  NA 139 140 138  K 4.1 4.3 4.1  CL 103 103 103  CO2 27 30 27   GLUCOSE 88 96 107*  BUN 14 18 17   CREATININE 1.04* 1.21* 1.08*  CALCIUM 8.2* 8.5* 8.3*   PT/INR  Recent Labs   11/07/14 0322  LABPROT 15.1  INR 1.17   Cholesterol  Recent Labs  11/05/14 0347  CHOL 121   Lipid Panel     Component Value Date/Time   CHOL 121 11/05/2014 0347   TRIG 81 11/05/2014 0347   HDL 26* 11/05/2014 0347   CHOLHDL 4.7 11/05/2014 0347   VLDL 16 11/05/2014 0347   LDLCALC 79 11/05/2014 0347    Assessment/Plan   Principal Problem:   Acute combined systolic and diastolic HF (heart failure), NYHA class 2 Feels a lot better.  No orthopnea.  Net fluids:  -2.1L/-11.0L.  SCr improved.  I think she has more fluid to diurese and continues to do so.  Last dose of Lasix was yesterday at ~0900hrs.  Currently discontinued.  We'll see what her pressures are during caths.  May need more lasix.      SOB (shortness of breath)   Uncontrolled hypertension  Stable and controlled.  Coreg 3.125mg  BID, lisinopril 5mg .   Microcytic anemia  Stable Hgb   Renal insufficiency, mild  Stable   Cardiomyopathy, EF 25% on Echo  Diffuse hypokinesis.  R/L heart caths today.      LOS: 4 days    HAGER, BRYAN PA-C 11/07/2014 12:09 PM  As above, patient seen and examined. Her congestive heart failure symptoms are much improved. Continue ACE inhibitor and beta blocker. Add Lasix 20 mg daily and spironolactone 25 mg daily.  Follow potassium and renal function. Titrate medications following discharge.  Proceed with right and left cardiac catheterization today. The risks and benefits were discussed and the patient agrees to proceed. Patient will require repeat echocardiogram in 3 months to see if LV function has improved on medications. She is noted to have a microcytic anemia but still menstruates. Olga Millers

## 2014-11-07 NOTE — Progress Notes (Signed)
Pt refused to sign consent.  States "I'm still sleepy".  Called and notified cath lab and also instructed by Crystal that she will be going around 2-p this afternoon.  Will continue to monitor.  Amanda Pea, Charity fundraiser.

## 2014-11-08 ENCOUNTER — Encounter (HOSPITAL_COMMUNITY): Payer: Self-pay | Admitting: Cardiovascular Disease

## 2014-11-08 DIAGNOSIS — E669 Obesity, unspecified: Secondary | ICD-10-CM

## 2014-11-08 LAB — BASIC METABOLIC PANEL
Anion gap: 8 (ref 5–15)
BUN: 14 mg/dL (ref 6–20)
CALCIUM: 8.8 mg/dL — AB (ref 8.9–10.3)
CO2: 28 mmol/L (ref 22–32)
Chloride: 103 mmol/L (ref 101–111)
Creatinine, Ser: 0.96 mg/dL (ref 0.44–1.00)
GFR calc Af Amer: >60 mL/min (ref >60)
GFR calc non Af Amer: >60 mL/min (ref >60)
Glucose, Bld: 92 mg/dL (ref 65–99)
Potassium: 4.1 mmol/L (ref 3.5–5.1)
Sodium: 139 mmol/L (ref 135–145)

## 2014-11-08 LAB — CBC
HCT: 34.1 % — ABNORMAL LOW (ref 36.0–46.0)
Hemoglobin: 10.5 g/dL — ABNORMAL LOW (ref 12.0–15.0)
MCH: 21.8 pg — AB (ref 26.0–34.0)
MCHC: 30.8 g/dL (ref 30.0–36.0)
MCV: 70.7 fL — AB (ref 78.0–100.0)
Platelets: 254 10*3/uL (ref 150–400)
RBC: 4.82 MIL/uL (ref 3.87–5.11)
RDW: 17.7 % — ABNORMAL HIGH (ref 11.5–15.5)
WBC: 5.6 10*3/uL (ref 4.0–10.5)

## 2014-11-08 MED ORDER — FUROSEMIDE 20 MG PO TABS
20.0000 mg | ORAL_TABLET | Freq: Every day | ORAL | Status: DC
  Administered 2014-11-08 – 2014-11-09 (×2): 20 mg via ORAL
  Filled 2014-11-08 (×2): qty 1

## 2014-11-08 MED ORDER — SPIRONOLACTONE 25 MG PO TABS
25.0000 mg | ORAL_TABLET | Freq: Every day | ORAL | Status: DC
  Administered 2014-11-08 – 2014-11-09 (×2): 25 mg via ORAL
  Filled 2014-11-08 (×2): qty 1

## 2014-11-08 MED ORDER — CARVEDILOL 6.25 MG PO TABS
6.2500 mg | ORAL_TABLET | Freq: Two times a day (BID) | ORAL | Status: DC
  Administered 2014-11-08 – 2014-11-09 (×3): 6.25 mg via ORAL
  Filled 2014-11-08 (×5): qty 1

## 2014-11-08 NOTE — Progress Notes (Signed)
Called to room by patient with c/o aching to right leg/groin at cath site.  Dressing removed for assessment, no drainage, ecchymosis or swelling noted.  Patient given PRN Tylenol as ordered. Upon follow up assessment, patient was asleep with no signs of discomfort or distress. Will continue to monitor. Troy Sine

## 2014-11-08 NOTE — Progress Notes (Signed)
Cardiac Monitoring has been discontinued per MD, Central Telemetry has been notified and patient has no complaints at this time.  Lorretta Harp RN

## 2014-11-08 NOTE — Progress Notes (Signed)
[facility]                                                                              Patient Demographics  Abigail Sharp, is a 49 y.o. female, DOB - 01-08-1966, RCB:[id number]  Admit date - 11/03/2014   Admitting Physician Haydee Monica, MD  Outpatient Primary MD for the patient is No primary care provider on file.  LOS - 5   Chief Complaint  Patient presents with  . Shortness of Breath  . Abdominal Pain      HPI on 11/03/2014 by Dr. Tarry Kos 49 yo healthy female with 3 weeks of progressive worsening sob especially with exertion associated with PND, LE and pannus swelling and cough when lying flat. Dyspnea is worse when lying flat and trying to sleep at night. No fevers. Cough nonproductive. She has no idea what her baseline weight is but knows she has gained significant weight over the last month. No chest pain. Pt had to walk over a mile to urgent care center today because the battery in her car went out. After evaluation at urgent care, she was transported to [facility] for evaluation of new chf. She was very sob when she walked today, but experienced no chest pain. She is sunburned from the walk. She also says she has discomfort in her pannus from the swelling, but no abdominal pain. She does not drink alcohol. She has no cardiac history or problems with her liver. No n/v/d. She takes no daily medications. Denies any melena or brbpr.  Assessment & Plan   Dyspnea secondary Acute combined systolic and diastolic heart failure -Patient had episode of orthopnea overnight, but feels improved now -BNP 1168 -Echocardiogram EF25%, diffuse hypokinesis, left ventricular diastolic dysfunction -Continue Lasix- cardiology decreased dose, as patient's creatinine had small bump- however, improved today -Continue to monitor daily weights, intake and output -Urine output over the past 24 hours: 2200 mL  -Weight down 8Kg since admission -Chest x-ray: No active cardiopulmonary  disease -Troponin negative -Cardiology consulted and appreciated, will likely need cath or nuclear study to exclude CAD -Continue coreg, lisinopril, lasix, aspirin, and spironolactone -Lipid panel: Total cholesterol 121, triglycerides 81, HDL 26, LDL 79 -Catheterization: Normal coronary arteries with elevated filling pressures consistent with dilated nonischemic coronary, medical therapy recommended, repeat echocardiogram in 3 months  Microcytic anemia -Unknown baseline -Anemia panel: Iron 25, ferritin 10 -Denies melana -Hemoglobin currently stable, 10.5 -Continue iron supplementation  Uncontrolled hypertension -Patient started on lisinopril, coreg, as well as receiving Lasix -Coreg increased -Spironolactone added  Mild renal insufficiency -Unknown baseline creatinine -Creatinine currently improving, 0.96  Hypomagnesemia -Replaced, Magnesium 1.9   Code Status: Full  Family Communication: None at bedside. Attempted to call son (in [place]), no answer.  Disposition Plan: Admitted. Continue to monitor BP.  Likely discharge within next 24hrs.   Time Spent in minutes   30 minutes  Procedures  Echocardiogram Right and left heart cath  Consults   Cardiology  DVT Prophylaxis  Lovenox  Lab Results  Component Value Date   PLT 254 11/08/2014    Medications  Scheduled Meds: . aspirin EC  81 mg Oral Daily  . carvedilol  6.25 mg Oral  BID WC  . enoxaparin (LOVENOX) injection  40 mg Subcutaneous Q24H  . ferrous sulfate  325 mg Oral BID WC  . furosemide  20 mg Oral Daily  . lisinopril  5 mg Oral Daily  . sodium chloride  3 mL Intravenous Q12H  . spironolactone  25 mg Oral Daily   Continuous Infusions:   PRN Meds:.sodium chloride, acetaminophen, benzonatate, morphine injection, ondansetron (ZOFRAN) IV, sodium chloride, sodium chloride  Antibiotics    Anti-infectives    None        Subjective:   [name] seen and examined today.  Patient continues to complain  of shortness of breath and heaviness in her chest however is able to lie flat. Continues to complain that she is not getting enough rest in the hospital. Denies any chest pain, palpitations, dizziness, headache, abdominal pain, nausea or vomiting.  Objective:   Filed Vitals:   11/08/14 0109 11/08/14 0645 11/08/14 0823 11/08/14 1009  BP: 125/69 116/70 125/76 147/92  Pulse: 72 69 73   Temp: 98 F (36.7 C) 97.9 F (36.6 C) 98.4 F (36.9 C)   TempSrc: Oral Oral Oral   Resp: 18 18    Height:      Weight:  109.589 kg (241 lb 9.6 oz)    SpO2: 100% 100% 98%     Wt Readings from Last 3 Encounters:  11/08/14 109.589 kg (241 lb 9.6 oz)     Intake/Output Summary (Last 24 hours) at 11/08/14 1356 Last data filed at 11/08/14 1300  Gross per 24 hour  Intake 1193.42 ml  Output   3450 ml  Net -2256.58 ml    Exam  General: Well developed, well nourished, NAD  Cardiovascular: S1 S2 auscultated, no murmurs, RRR  Respiratory: Clear to auscultation bilaterally  Abdomen: Soft, obese, nontender, nondistended, + bowel sounds  Extremities: warm dry without cyanosis clubbing, no edema  Psych: Appropriate mood and affect  Data Review   Micro Results No results found for this or any previous visit (from the past 240 hour(s)).  Radiology Reports Dg Chest 2 View (if Patient Has Fever And/or Copd)  11/03/2014   CLINICAL DATA:  Shortness of breath and abdominal pain.  EXAM: CHEST  2 VIEW  COMPARISON:  None  FINDINGS: The heart size and mediastinal contours are within normal limits. Both lungs are clear. The visualized skeletal structures are unremarkable.  IMPRESSION: No active cardiopulmonary disease.   Electronically Signed   By: Signa Kell M.D.   On: 11/03/2014 18:27    CBC  Recent Labs Lab 11/03/14 1800 11/03/14 2303 11/04/14 0421 11/05/14 0347 11/07/14 0322 11/08/14 0350  WBC 6.9 8.0 7.2 5.4 5.8 5.6  HGB 11.4* 11.7* 11.0* 10.7* 10.7* 10.5*  HCT 36.5 37.7 35.0* 33.8* 35.5*  34.1*  PLT 315 330 260 249 272 254  MCV 69.9* 70.1* 69.9* 69.7* 69.9* 70.7*  MCH 21.8* 21.7* 22.0* 22.1* 21.1* 21.8*  MCHC 31.2 31.0 31.4 31.7 30.1 30.8  RDW 17.7* 17.9* 17.8* 17.8* 17.7* 17.7*  LYMPHSABS 2.1  --   --   --   --   --   MONOABS 0.6  --   --   --   --   --   EOSABS 0.1  --   --   --   --   --   BASOSABS 0.0  --   --   --   --   --     Chemistries   Recent Labs Lab 11/03/14 1800  11/04/14 0421  11/04/14 1843 11/05/14 0347 11/06/14 0537 11/07/14 0322 11/08/14 0350  NA 141  --  139  --  139 140 138 139  K 4.0  --  3.7  --  4.1 4.3 4.1 4.1  CL 107  --  103  --  103 103 103 103  CO2 25  --  22  --  GLUCOSE 91  --  74  --  88 96 107* 92  BUN 10  --  10  --  CREATININE 1.23*  < > 1.15*  --  1.04* 1.21* 1.08* 0.96  CALCIUM 9.0  --  8.6*  --  8.2* 8.5* 8.3* 8.8*  MG  --   --   --  1.6*  --  1.9  --   --   AST 30  --   --   --   --   --   --   --   ALT 18  --   --   --   --   --   --   --   ALKPHOS 76  --   --   --   --   --   --   --   BILITOT 1.3*  --   --   --   --   --   --   --   < > = values in this interval not displayed. ------------------------------------------------------------------------------------------------------------------ estimated creatinine clearance is 91.4 mL/min (by C-G formula based on Cr of 0.96). ------------------------------------------------------------------------------------------------------------------ No results for input(s): HGBA1C in the last 72 hours. ------------------------------------------------------------------------------------------------------------------ No results for input(s): CHOL, HDL, LDLCALC, TRIG, CHOLHDL, LDLDIRECT in the last 72 hours. ------------------------------------------------------------------------------------------------------------------ No results for input(s): TSH, T4TOTAL, T3FREE, THYROIDAB in the last 72 hours.  Invalid input(s):  FREET3 ------------------------------------------------------------------------------------------------------------------ No results for input(s): VITAMINB12, FOLATE, FERRITIN, TIBC, IRON, RETICCTPCT in the last 72 hours.  Coagulation profile  Recent Labs Lab 11/07/14 0322  INR 1.17    No results for input(s): DDIMER in the last 72 hours.  Cardiac Enzymes  Recent Labs Lab 11/03/14 2303 11/04/14 0421 11/04/14 1120  TROPONINI 0.03 0.03 0.03   ------------------------------------------------------------------------------------------------------------------ Invalid input(s): POCBNP    Katiya Fike D.O. on 11/08/2014 at 1:56 PM  Between 7am to 7pm - Pager - [phone]  After 7pm go to [email or web address] - password [email or web address]  And look for the night coverage person covering for me after hours  [facility] Office  [phone]

## 2014-11-08 NOTE — Progress Notes (Signed)
    Subjective:   Feels better, no pain, no SOB, laying flat  Objective:  Vital Signs in the last 24 hours: Temp:  [97.9 F (36.6 C)-98.4 F (36.9 C)] 98.4 F (36.9 C) (06/07 0823) Pulse Rate:  [0-81] 73 (06/07 0823) Resp:  [0-24] 18 (06/07 0645) BP: (104-164)/(58-92) 147/92 mmHg (06/07 1009) SpO2:  [98 %-100 %] 98 % (06/07 0823) Weight:  [241 lb 9.6 oz (109.589 kg)] 241 lb 9.6 oz (109.589 kg) (06/07 0645)  Intake/Output from previous day: 06/06 0701 - 06/07 0700 In: 973.4 [P.O.:940; I.V.:33.4] Out: 2700 [Urine:2200; Stool:500]   Physical Exam: General: Well developed, well nourished, in no acute distress. Head:  Normocephalic and atraumatic. Lungs: Clear to auscultation and percussion. Heart: Normal S1 and S2.  No murmur, rubs or gallops.  Abdomen: soft, non-tender, positive bowel sounds. Obese Extremities: No clubbing or cyanosis. No edema. Cath site with no significant hematoma. Normal distal pulses. Neurologic: Alert and oriented x 3.    Lab Results:  Recent Labs  11/07/14 0322 11/08/14 0350  WBC 5.8 5.6  HGB 10.7* 10.5*  PLT 272 254    Recent Labs  11/07/14 0322 11/08/14 0350  NA 138 139  K 4.1 4.1  CL 103 103  CO2 27 28  GLUCOSE 107* 92  BUN 17 14  CREATININE 1.08* 0.96   Telemetry: No adverse arrhythmias Personally viewed.   EKG:  Sinus rhythm 91 nonspecific T-wave changes  Cardiac Studies:  Cardiac catheterization 11/07/14-normal coronary arteries  Scheduled Meds: . aspirin EC  81 mg Oral Daily  . carvedilol  3.125 mg Oral BID WC  . enoxaparin (LOVENOX) injection  40 mg Subcutaneous Q24H  . ferrous sulfate  325 mg Oral BID WC  . furosemide  20 mg Oral Daily  . lisinopril  5 mg Oral Daily  . sodium chloride  3 mL Intravenous Q12H  . spironolactone  25 mg Oral Daily   Continuous Infusions:  PRN Meds:.sodium chloride, acetaminophen, benzonatate, morphine injection, ondansetron (ZOFRAN) IV, sodium chloride, sodium  chloride  Assessment/Plan:  Principal Problem:   Acute combined systolic and diastolic HF (heart failure), NYHA class 2 Active Problems:   SOB (shortness of breath)   Uncontrolled hypertension   Microcytic anemia   Renal insufficiency, mild   Cardiomyopathy, EF 25% on Echo   CHF exacerbation   Acute systolic heart failure  - Nonischemic cardiomyopathy  - EF 25%.  - I will increase carvedilol to 6.25 mg twice a day. Continue lisinopril 5 mg a day. Continue further increase as outpatient.  - Continue maintenance low-dose Lasix.  - Continue spironolactone. Check basic met about profile in 1-2 weeks.  - In 3 months, 90 days, reassess left ventricular ejection fraction. If remains decreased less than 35%, consider ICD therapy.  Obesity - -Body mass index is 37.83 kg/(m^2).  - Encourage weight loss  Comfortable with discharge today. Close follow-up in cardiology clinic.  Yamil Dougher, [place] 11/08/2014, 12:09 PM

## 2014-11-08 NOTE — Progress Notes (Signed)
   11/08/14 1500  Clinical Encounter Type  Visited With Family  Visit Type Initial;Psychological support;Spiritual support;Social support  Referral From Nurse  Consult/Referral To Chaplain  Spiritual Encounters  Spiritual Needs Emotional  CH met with patient; patient not spiritual or religious; patient self assessed herself as depressed and OCD; patient has little or no home support and no transportation; patient describes herself as being stuck and unable to leave her apartment and draws her room divider to isolate herself; patient indicated openness to counseling/therapy to help her coping with her perceived diagnoses; patient indicated she had requested psych/other consult; RN indicated referral has already been made.

## 2014-11-09 DIAGNOSIS — I509 Heart failure, unspecified: Secondary | ICD-10-CM

## 2014-11-09 DIAGNOSIS — D509 Iron deficiency anemia, unspecified: Secondary | ICD-10-CM

## 2014-11-09 DIAGNOSIS — N289 Disorder of kidney and ureter, unspecified: Secondary | ICD-10-CM

## 2014-11-09 DIAGNOSIS — R0602 Shortness of breath: Secondary | ICD-10-CM

## 2014-11-09 MED ORDER — SPIRONOLACTONE 25 MG PO TABS: 25.0000 mg | ORAL_TABLET | Freq: Every day | ORAL | Status: DC

## 2014-11-09 MED ORDER — LISINOPRIL 5 MG PO TABS: 5.0000 mg | ORAL_TABLET | Freq: Every day | ORAL | Status: DC

## 2014-11-09 MED ORDER — FUROSEMIDE 20 MG PO TABS: 20.0000 mg | ORAL_TABLET | Freq: Every day | ORAL | Status: DC

## 2014-11-09 MED ORDER — ASPIRIN 81 MG PO TBEC: 81.0000 mg | DELAYED_RELEASE_TABLET | Freq: Every day | ORAL | Status: DC

## 2014-11-09 MED ORDER — CARVEDILOL 6.25 MG PO TABS: 6.2500 mg | ORAL_TABLET | Freq: Two times a day (BID) | ORAL | Status: DC

## 2014-11-09 MED FILL — Heparin Sodium (Porcine) 2 Unit/ML in Sodium Chloride 0.9%: INTRAMUSCULAR | Qty: 1000 | Status: AC

## 2014-11-09 NOTE — Discharge Summary (Signed)
Physician Discharge Summary  Abigail Sharp WJX:[id number] DOB: 22-May-1966 DOA: 11/03/2014  PCP: No primary care provider on file.  Admit date: 11/03/2014 Discharge date: 11/09/2014  Time spent: 40 minutes  Recommendations for Outpatient Follow-up:  1. Follow-up with cardiology in 1-2 weeks.  Discharge Diagnoses:  Principal Problem:   Acute combined systolic and diastolic HF (heart failure), NYHA class 2 Active Problems:   SOB (shortness of breath)   Uncontrolled hypertension   Microcytic anemia   Renal insufficiency, mild   Cardiomyopathy, EF 25% on Echo   CHF exacerbation   Discharge Condition:  Stable  Diet recommendation: Heart healthy  Filed Weights   11/07/14 0500 11/08/14 0645 11/09/14 0648  Weight: 111.358 kg (245 lb 8 oz) 109.589 kg (241 lb 9.6 oz) 108.001 kg (238 lb 1.6 oz)    History of present illness:  49 yo healthy female with 3 weeks of progressive worsening sob especially with exertion associated with PND, LE and pannus swelling and cough when lying flat. Dyspnea is worse when lying flat and trying to sleep at night. No fevers. Cough nonproductive. She has no idea what her baseline weight is but knows she has gained significant weight over the last month. No chest pain. Pt had to walk over a mile to urgent care center today because the battery in her car went out. After evaluation at urgent care, she was transported to [facility] for evaluation of new chf. She was very sob when she walked today, but experienced no chest pain. She is sunburned from the walk. She also says she has discomfort in her pannus from the swelling, but no abdominal pain. She does not drink alcohol. She has no cardiac history or problems with her liver. No n/v/d. She takes no daily medications. Denies any melena or brbpr.  Hospital Course:   Acute systolic CHF -Stented with orthopnea, DOE, SOB and lower x-rays were swelling. -BNP 1168 -Echocardiogram LVEF of 25%, diffuse hypokinesis,  left ventricular diastolic dysfunction -Started immediately on diuretics on admission plus beta blockers and ACE inhibitors. -According to the chart weight decrease from 259 down to 238, she almost lost 20 pounds in 3 days. -Cardiology performed cardiac catheterization showed non-ischemic cardiomyopathy. -On discharge medications include Coreg, lisinopril, Lasix and spironolactone. -Per cardiology evaluate LVEF in 90 days if still less than 30% consider AICD placement.  Microcytic anemia -Unknown baseline -Anemia panel: Iron 25, ferritin 10 -Hemoglobin currently stable, 10.5 -Continue iron supplementation  Uncontrolled hypertension -Patient started on lisinopril, coreg, as well as receiving Lasix -Coreg and spironolactone added, blood pressure is controlled on the time of discharge.  Mild renal insufficiency -Unknown baseline creatinine -Creatinine currently improved to 0.96.  Hypomagnesemia -Replaced, Magnesium 1.9    Procedures: Cardiac catheterization done on 11/07/2014 by Dr. Allyson Sabal IMPRESSION:normal coronary arteries with elevated filling pressures consistent with dilated nonischemic coronary myopathy. Medical therapy will be recommended. The patient should have a repeat 2-D echo in 3 months to assess improvement in LV function. She will may ultimately require an ICD for primary prevention. Afebrile angina was performed and a minx closure device was placed.  Consultations:  Cardiology  Discharge Exam: Filed Vitals:   11/09/14 1046  BP: 104/51  Pulse: 73  Temp: 98.7 F (37.1 C)  Resp:    General: Alert and awake, oriented x3, not in any acute distress. HEENT: anicteric sclera, pupils reactive to light and accommodation, EOMI CVS: S1-S2 clear, no murmur rubs or gallops Chest: clear to auscultation bilaterally, no wheezing, rales or rhonchi Abdomen:  soft nontender, nondistended, normal bowel sounds, no organomegaly Extremities: no cyanosis, clubbing or edema noted  bilaterally Neuro: Cranial nerves II-XII intact, no focal neurological deficits  Discharge Instructions   Discharge Instructions    Diet - low sodium heart healthy    Complete by:  As directed      Increase activity slowly    Complete by:  As directed           Current Discharge Medication List    START taking these medications   Details  aspirin EC 81 MG EC tablet Take 1 tablet (81 mg total) by mouth daily. Qty: 30 tablet, Refills: 0    carvedilol (COREG) 6.25 MG tablet Take 1 tablet (6.25 mg total) by mouth 2 (two) times daily with a meal. Qty: 60 tablet, Refills: 0    furosemide (LASIX) 20 MG tablet Take 1 tablet (20 mg total) by mouth daily. Qty: 30 tablet, Refills: 0    lisinopril (PRINIVIL,ZESTRIL) 5 MG tablet Take 1 tablet (5 mg total) by mouth daily. Qty: 30 tablet, Refills: 0    spironolactone (ALDACTONE) 25 MG tablet Take 1 tablet (25 mg total) by mouth daily. Qty: 30 tablet, Refills: 0       Allergies  Allergen Reactions  . Sulfa Antibiotics Other (See Comments)    unknown   Follow-up Information    Follow up with [facility] In 2 weeks.   Contact information:   [address] [place] [address]        The results of significant diagnostics from this hospitalization (including imaging, microbiology, ancillary and laboratory) are listed below for reference.    Significant Diagnostic Studies: Dg Chest 2 View (if Patient Has Fever And/or Copd)  11/03/2014   CLINICAL DATA:  Shortness of breath and abdominal pain.  EXAM: CHEST  2 VIEW  COMPARISON:  None  FINDINGS: The heart size and mediastinal contours are within normal limits. Both lungs are clear. The visualized skeletal structures are unremarkable.  IMPRESSION: No active cardiopulmonary disease.   Electronically Signed   By: Signa Kell M.D.   On: 11/03/2014 18:27    Microbiology: No results found for this or any previous visit (from the past 240 hour(s)).    Labs: Basic Metabolic Panel:  Recent Labs Lab 11/04/14 0421 11/04/14 1843 11/05/14 0347 11/06/14 0537 11/07/14 0322 11/08/14 0350  NA 139  --  139 140 138 139  K 3.7  --  4.1 4.3 4.1 4.1  CL 103  --  103 103 103 103  CO2 22  --  GLUCOSE 74  --  88 96 107* 92  BUN 10  --  CREATININE 1.15*  --  1.04* 1.21* 1.08* 0.96  CALCIUM 8.6*  --  8.2* 8.5* 8.3* 8.8*  MG  --  1.6*  --  1.9  --   --    Liver Function Tests:  Recent Labs Lab 11/03/14 1800  AST 30  ALT 18  ALKPHOS 76  BILITOT 1.3*  PROT 7.5  ALBUMIN 3.5   No results for input(s): LIPASE, AMYLASE in the last 168 hours. No results for input(s): AMMONIA in the last 168 hours. CBC:  Recent Labs Lab 11/03/14 1800 11/03/14 2303 11/04/14 0421 11/05/14 0347 11/07/14 0322 11/08/14 0350  WBC 6.9 8.0 7.2 5.4 5.8 5.6  NEUTROABS 4.1  --   --   --   --   --   HGB 11.4* 11.7*  11.0* 10.7* 10.7* 10.5*  HCT 36.5 37.7 35.0* 33.8* 35.5* 34.1*  MCV 69.9* 70.1* 69.9* 69.7* 69.9* 70.7*  PLT 315 330 260 249 272 254   Cardiac Enzymes:  Recent Labs Lab 11/03/14 2303 11/04/14 0421 11/04/14 1120  TROPONINI 0.03 0.03 0.03   BNP: BNP (last 3 results)  Recent Labs  11/03/14 1800  BNP 1168.3*    ProBNP (last 3 results) No results for input(s): PROBNP in the last 8760 hours.  CBG: No results for input(s): GLUCAP in the last 168 hours.     Signed:  Ameli Sharp A  [facility] 11/09/2014, 2:52 PM

## 2014-11-09 NOTE — Progress Notes (Signed)
Talked to patient about the importance of having a PCP; pt has apt with the [facility] for 11/16/2014 at 11:15 am; Patient also have private insurance with [employer]- patient constantly asked " how do you know what insurance I have, I did not tell anyone?" CM informed the patient that it is on her medical records and that she can call Admitting and ask them how did they obtain information about her insurance. Soc Worker to work on getting patient an outpatient apt with Psych for depression. Abelino Derrick RN,BSN,MHA [phone]

## 2014-11-09 NOTE — Progress Notes (Signed)
Patient discharged to home.  Patient alert, oriented and ambulatory. Patient had been very irritable and agitated towards staff throughout discharge process. Patient stated she didn't have a ride home, that she was going to walk. CN notified AC and then told patient that we would supply taxi voucher for her ride home.   Upon telling patient that a taxi voucher was to be provided for her she became irate and told the NT that she would just walk home.  CN and AC notified.  Patient walked off unit with NT to hospital entrance.  Dianna Limbo M]

## 2014-11-09 NOTE — Progress Notes (Signed)
Shift assessment completed, morning medications has been passed, patient is stable and has no complaints at this time.  Will continue to monitor. D.Treveon Bourcier RN

## 2014-11-09 NOTE — Progress Notes (Signed)
Patient alert oriented, denies pain, no shortness of breath, iv and tele d/c v/s stable. D/c instruction explain and given to patient. Patient verbalized understanding . D/c patient home per order

## 2014-11-17 ENCOUNTER — Ambulatory Visit: Payer: No Typology Code available for payment source | Attending: Internal Medicine | Admitting: Internal Medicine

## 2014-11-17 ENCOUNTER — Encounter: Payer: Self-pay | Admitting: Internal Medicine

## 2014-11-17 VITALS — BP 120/86 | HR 90 | Temp 98.0°F | Resp 16 | Wt 228.8 lb

## 2014-11-17 DIAGNOSIS — R7309 Other abnormal glucose: Secondary | ICD-10-CM | POA: Insufficient documentation

## 2014-11-17 DIAGNOSIS — Z7982 Long term (current) use of aspirin: Secondary | ICD-10-CM | POA: Insufficient documentation

## 2014-11-17 DIAGNOSIS — D509 Iron deficiency anemia, unspecified: Secondary | ICD-10-CM | POA: Insufficient documentation

## 2014-11-17 DIAGNOSIS — R7303 Prediabetes: Secondary | ICD-10-CM

## 2014-11-17 DIAGNOSIS — Z1231 Encounter for screening mammogram for malignant neoplasm of breast: Secondary | ICD-10-CM

## 2014-11-17 DIAGNOSIS — I10 Essential (primary) hypertension: Secondary | ICD-10-CM | POA: Diagnosis not present

## 2014-11-17 DIAGNOSIS — I5040 Unspecified combined systolic (congestive) and diastolic (congestive) heart failure: Secondary | ICD-10-CM | POA: Diagnosis not present

## 2014-11-17 DIAGNOSIS — H6121 Impacted cerumen, right ear: Secondary | ICD-10-CM | POA: Diagnosis not present

## 2014-11-17 DIAGNOSIS — I429 Cardiomyopathy, unspecified: Secondary | ICD-10-CM | POA: Insufficient documentation

## 2014-11-17 DIAGNOSIS — R0981 Nasal congestion: Secondary | ICD-10-CM | POA: Diagnosis not present

## 2014-11-17 LAB — COMPLETE METABOLIC PANEL WITH GFR
ALBUMIN: 3.8 g/dL (ref 3.5–5.2)
ALT: 20 U/L (ref 0–35)
AST: 32 U/L (ref 0–37)
Alkaline Phosphatase: 70 U/L (ref 39–117)
BUN: 40 mg/dL — ABNORMAL HIGH (ref 6–23)
CALCIUM: 10.3 mg/dL (ref 8.4–10.5)
CHLORIDE: 102 meq/L (ref 96–112)
CO2: 22 mEq/L (ref 19–32)
CREATININE: 1.26 mg/dL — AB (ref 0.50–1.10)
GFR, EST AFRICAN AMERICAN: 58 mL/min — AB
GFR, Est Non African American: 51 mL/min — ABNORMAL LOW
Glucose, Bld: 75 mg/dL (ref 70–99)
Potassium: 4.3 mEq/L (ref 3.5–5.3)
SODIUM: 138 meq/L (ref 135–145)
TOTAL PROTEIN: 7.5 g/dL (ref 6.0–8.3)
Total Bilirubin: 0.5 mg/dL (ref 0.2–1.2)

## 2014-11-17 MED ORDER — FLUTICASONE PROPIONATE 50 MCG/ACT NA SUSP: 2.0000 | Freq: Every day | NASAL | Status: DC

## 2014-11-17 MED ORDER — CARBAMIDE PEROXIDE 6.5 % OT SOLN: 5.0000 [drp] | Freq: Two times a day (BID) | OTIC | Status: DC

## 2014-11-17 NOTE — Patient Instructions (Signed)
DASH Eating Plan DASH stands for "Dietary Approaches to Stop Hypertension." The DASH eating plan is a healthy eating plan that has been shown to reduce high blood pressure (hypertension). Additional health benefits may include reducing the risk of type 2 diabetes mellitus, heart disease, and stroke. The DASH eating plan may also help with weight loss. WHAT DO I NEED TO KNOW ABOUT THE DASH EATING PLAN? For the DASH eating plan, you will follow these general guidelines:  Choose foods with a percent daily value for sodium of less than 5% (as listed on the food label).  Use salt-free seasonings or herbs instead of table salt or sea salt.  Check with your health care provider or pharmacist before using salt substitutes.  Eat lower-sodium products, often labeled as "lower sodium" or "no salt added."  Eat fresh foods.  Eat more vegetables, fruits, and low-fat dairy products.  Choose whole grains. Look for the word "whole" as the first word in the ingredient list.  Choose fish and skinless chicken or turkey more often than red meat. Limit fish, poultry, and meat to 6 oz (170 g) each day.  Limit sweets, desserts, sugars, and sugary drinks.  Choose heart-healthy fats.  Limit cheese to 1 oz (28 g) per day.  Eat more home-cooked food and less restaurant, buffet, and fast food.  Limit fried foods.  Cook foods using methods other than frying.  Limit canned vegetables. If you do use them, rinse them well to decrease the sodium.  When eating at a restaurant, ask that your food be prepared with less salt, or no salt if possible. WHAT FOODS CAN I EAT? Seek help from a dietitian for individual calorie needs. Grains Whole grain or whole wheat bread. Brown rice. Whole grain or whole wheat pasta. Quinoa, bulgur, and whole grain cereals. Low-sodium cereals. Corn or whole wheat flour tortillas. Whole grain cornbread. Whole grain crackers. Low-sodium crackers. Vegetables Fresh or frozen vegetables  (raw, steamed, roasted, or grilled). Low-sodium or reduced-sodium tomato and vegetable juices. Low-sodium or reduced-sodium tomato sauce and paste. Low-sodium or reduced-sodium canned vegetables.  Fruits All fresh, canned (in natural juice), or frozen fruits. Meat and Other Protein Products Ground beef (85% or leaner), grass-fed beef, or beef trimmed of fat. Skinless chicken or turkey. Ground chicken or turkey. Pork trimmed of fat. All fish and seafood. Eggs. Dried beans, peas, or lentils. Unsalted nuts and seeds. Unsalted canned beans. Dairy Low-fat dairy products, such as skim or 1% milk, 2% or reduced-fat cheeses, low-fat ricotta or cottage cheese, or plain low-fat yogurt. Low-sodium or reduced-sodium cheeses. Fats and Oils Tub margarines without trans fats. Light or reduced-fat mayonnaise and salad dressings (reduced sodium). Avocado. Safflower, olive, or canola oils. Natural peanut or almond butter. Other Unsalted popcorn and pretzels. The items listed above may not be a complete list of recommended foods or beverages. Contact your dietitian for more options. WHAT FOODS ARE NOT RECOMMENDED? Grains White bread. White pasta. White rice. Refined cornbread. Bagels and croissants. Crackers that contain trans fat. Vegetables Creamed or fried vegetables. Vegetables in a cheese sauce. Regular canned vegetables. Regular canned tomato sauce and paste. Regular tomato and vegetable juices. Fruits Dried fruits. Canned fruit in light or heavy syrup. Fruit juice. Meat and Other Protein Products Fatty cuts of meat. Ribs, chicken wings, bacon, sausage, bologna, salami, chitterlings, fatback, hot dogs, bratwurst, and packaged luncheon meats. Salted nuts and seeds. Canned beans with salt. Dairy Whole or 2% milk, cream, half-and-half, and cream cheese. Whole-fat or sweetened yogurt. Full-fat   cheeses or blue cheese. Nondairy creamers and whipped toppings. Processed cheese, cheese spreads, or cheese  curds. Condiments Onion and garlic salt, seasoned salt, table salt, and sea salt. Canned and packaged gravies. Worcestershire sauce. Tartar sauce. Barbecue sauce. Teriyaki sauce. Soy sauce, including reduced sodium. Steak sauce. Fish sauce. Oyster sauce. Cocktail sauce. Horseradish. Ketchup and mustard. Meat flavorings and tenderizers. Bouillon cubes. Hot sauce. Tabasco sauce. Marinades. Taco seasonings. Relishes. Fats and Oils Butter, stick margarine, lard, shortening, ghee, and bacon fat. Coconut, palm kernel, or palm oils. Regular salad dressings. Other Pickles and olives. Salted popcorn and pretzels. The items listed above may not be a complete list of foods and beverages to avoid. Contact your dietitian for more information. WHERE CAN I FIND MORE INFORMATION? National Heart, [employer], [employer]: www.nhlbi.nih.gov/health/health-topics/topics/dash/ Document Released: 05/09/2011 Document Revised: 10/04/2013 Document Reviewed: 03/24/2013 ExitCare Patient Information 2015 [employer]. This information is not intended to replace advice given to you by your health care provider. Make sure you discuss any questions you have with your health care provider. Diabetes Mellitus and Food It is important for you to manage your blood sugar (glucose) level. Your blood glucose level can be greatly affected by what you eat. Eating healthier foods in the appropriate amounts throughout the day at about the same time each day will help you control your blood glucose level. It can also help slow or prevent worsening of your diabetes mellitus. Healthy eating may even help you improve the level of your blood pressure and reach or maintain a healthy weight.  HOW CAN FOOD AFFECT ME? Carbohydrates Carbohydrates affect your blood glucose level more than any other type of food. Your dietitian will help you determine how many carbohydrates to eat at each meal and teach you how to count carbohydrates. Counting  carbohydrates is important to keep your blood glucose at a healthy level, especially if you are using insulin or taking certain medicines for diabetes mellitus. Alcohol Alcohol can cause sudden decreases in blood glucose (hypoglycemia), especially if you use insulin or take certain medicines for diabetes mellitus. Hypoglycemia can be a life-threatening condition. Symptoms of hypoglycemia (sleepiness, dizziness, and disorientation) are similar to symptoms of having too much alcohol.  If your health care provider has given you approval to drink alcohol, do so in moderation and use the following guidelines:  Women should not have more than one drink per day, and men should not have more than two drinks per day. One drink is equal to:  12 oz of beer.  5 oz of wine.  1 oz of hard liquor.  Do not drink on an empty stomach.  Keep yourself hydrated. Have water, diet soda, or unsweetened iced tea.  Regular soda, juice, and other mixers might contain a lot of carbohydrates and should be counted. WHAT FOODS ARE NOT RECOMMENDED? As you make food choices, it is important to remember that all foods are not the same. Some foods have fewer nutrients per serving than other foods, even though they might have the same number of calories or carbohydrates. It is difficult to get your body what it needs when you eat foods with fewer nutrients. Examples of foods that you should avoid that are high in calories and carbohydrates but low in nutrients include:  Trans fats (most processed foods list trans fats on the Nutrition Facts label).  Regular soda.  Juice.  Candy.  Sweets, such as cake, pie, doughnuts, and cookies.  Fried foods. WHAT FOODS CAN I EAT? Have nutrient-rich foods,   which will nourish your body and keep you healthy. The food you should eat also will depend on several factors, including:  The calories you need.  The medicines you take.  Your weight.  Your blood glucose level.  Your  blood pressure level.  Your cholesterol level. You also should eat a variety of foods, including:  Protein, such as meat, poultry, fish, tofu, nuts, and seeds (lean animal proteins are best).  Fruits.  Vegetables.  Dairy products, such as milk, cheese, and yogurt (low fat is best).  Breads, grains, pasta, cereal, rice, and beans.  Fats such as olive oil, trans fat-free margarine, canola oil, avocado, and olives. DOES EVERYONE WITH DIABETES MELLITUS HAVE THE SAME MEAL PLAN? Because every person with diabetes mellitus is different, there is not one meal plan that works for everyone. It is very important that you meet with a dietitian who will help you create a meal plan that is just right for you. Document Released: 02/14/2005 Document Revised: 05/25/2013 Document Reviewed: 04/16/2013 ExitCare Patient Information 2015 [employer]. This information is not intended to replace advice given to you by your health care provider. Make sure you discuss any questions you have with your health care provider.  

## 2014-11-17 NOTE — Progress Notes (Signed)
Patient here for follow up from the hospital Patient was admitted with CHF Patient complains of having a cough and nasal congestion Patient states her cough is worse when lying down

## 2014-11-17 NOTE — Progress Notes (Signed)
Patient Demographics  Abigail Sharp, is a 49 y.o. female  [id number]  [id number]  DOB - 05/30/1966  CC:  Chief Complaint  Patient presents with  . Hospitalization Follow-up    CHF       HPI: Abigail Sharp is a 49 y.o. female here today to establish medical care.patient was recently hospitalized with symptoms of exertional shortness of breath orthopnea PND leg swelling, EMR reviewed patient was diagnosed with combined CHF with EF of 25%, she had a cardiac cath done which showed nonischemic cardiomyopathy, she was treated with IV Lasix was started on ACE inhibitor beta blocker, spironolactone, she symptomatically improved was discharged to follow with cardiology, she is compliant in taking her medications, also noted she has microcytic anemia but at this point patient is not taking any iron supplement, have counseled patient start taking the medication since she is complaining of feeling tired, denies any orthopnea or PND or leg swelling.she does complain of some nasal congestion postnasal drip and minimal cough which is nonproductive. Patient has No headache, No chest pain, No abdominal pain - No Nausea, No new weakness tingling or numbness, No  SOB.  Allergies  Allergen Reactions  . Sulfa Antibiotics Other (See Comments)    unknown   History reviewed. No pertinent past medical history. Current Outpatient Prescriptions on File Prior to Visit  Medication Sig Dispense Refill  . carvedilol (COREG) 6.25 MG tablet Take 1 tablet (6.25 mg total) by mouth 2 (two) times daily with a meal. 60 tablet 0  . furosemide (LASIX) 20 MG tablet Take 1 tablet (20 mg total) by mouth daily. 30 tablet 0  . lisinopril (PRINIVIL,ZESTRIL) 5 MG tablet Take 1 tablet (5 mg total) by mouth daily. 30 tablet 0  . spironolactone (ALDACTONE) 25 MG tablet Take 1 tablet (25 mg total) by mouth daily. 30 tablet 0  . aspirin EC 81 MG EC tablet Take 1 tablet (81 mg total) by mouth daily. 30 tablet 0   No  current facility-administered medications on file prior to visit.   Family History  Problem Relation Age of Onset  . Diabetes Mother   . Hypertension Mother   . Diabetes Father   . Heart disease Father   . Hypertension Father   . Diabetes Sister    History   Social History  . Marital Status: Single    Spouse Name: N/A  . Number of Children: N/A  . Years of Education: N/A   Occupational History  . Not on file.   Social History Main Topics  . Smoking status: Never Smoker   . Smokeless tobacco: Not on file  . Alcohol Use: No  . Drug Use: No  . Sexual Activity: Yes   Other Topics Concern  . Not on file   Social History Narrative    Review of Systems: Constitutional: Negative for fever, chills, diaphoresis, activity change, appetite change and fatigue. HENT: Negative for ear pain, nosebleeds, congestion, facial swelling, rhinorrhea, neck pain, neck stiffness and ear discharge.  Eyes: Negative for pain, discharge, redness, itching and visual disturbance. Respiratory: Negative for cough, choking, chest tightness, shortness of breath, wheezing and stridor.  Cardiovascular: Negative for chest pain, palpitations and leg swelling. Gastrointestinal: Negative for abdominal distention. Genitourinary: Negative for dysuria, urgency, frequency, hematuria, flank pain, decreased urine volume, difficulty urinating and dyspareunia.  Musculoskeletal: Negative for back pain, joint swelling, arthralgia and gait problem. Neurological: Negative for dizziness, tremors, seizures, syncope, facial asymmetry, speech difficulty, weakness, light-headedness, numbness and headaches.  Hematological: Negative for adenopathy. Does not bruise/bleed easily. Psychiatric/Behavioral: Negative for hallucinations, behavioral problems, confusion, dysphoric mood, decreased concentration and agitation.    Objective:   Filed Vitals:   11/17/14 1139  BP: 120/86  Pulse: 90  Temp: 98 F (36.7 C)  Resp: 16     Physical Exam: Constitutional: Patient appears well-developed and well-nourished. No distress. HENT: Normocephalic, atraumatic, External right and left ear normal. Oropharynx is clear and moist.  Eyes: Conjunctivae and EOM are normal. PERRLA, no scleral icterus. Neck: Normal ROM. Neck supple. No JVD. No tracheal deviation. No thyromegaly. CVS: RRR, S1/S2 +, no murmurs, no gallops, no carotid bruit.  Pulmonary: Effort and breath sounds normal, no stridor, rhonchi, wheezes, rales.  Abdominal: Soft. BS +, no distension, tenderness, rebound or guarding.  Musculoskeletal: Normal range of motion. No edema and no tenderness.  Neuro: Alert. Normal reflexes, muscle tone coordination. No cranial nerve deficit. Skin: Skin is warm and dry. No rash noted. Not diaphoretic. No erythema. No pallor. Psychiatric: Normal mood and affect. Behavior, judgment, thought content normal.  Lab Results  Component Value Date   WBC 5.6 11/08/2014   HGB 10.5* 11/08/2014   HCT 34.1* 11/08/2014   MCV 70.7* 11/08/2014   PLT 254 11/08/2014   Lab Results  Component Value Date   CREATININE 0.96 11/08/2014   BUN 14 11/08/2014   NA 139 11/08/2014   K 4.1 11/08/2014   CL 103 11/08/2014   CO2 28 11/08/2014    Lab Results  Component Value Date/Time   HGBA1C 5.7* 11/05/2014 03:47 AM   Lipid Panel     Component Value Date/Time   CHOL 121 11/05/2014 0347   TRIG 81 11/05/2014 0347   HDL 26* 11/05/2014 0347   CHOLHDL 4.7 11/05/2014 0347   VLDL 16 11/05/2014 0347   LDLCALC 79 11/05/2014 0347       Assessment and plan:   1. Cardiomyopathy, EF 25% on Echo/2. Combined systolic and diastolic congestive heart failure, unspecified congestive heart failure chronicity  Currently patient is on Coreg, Lasix, lisinopril, as per rectum, aspirin, will check blood chemistry, patient is also going to follow with cardiology  - COMPLETE METABOLIC PANEL WITH GFR  3. Microcytic anemia Advised patient to start taking iron  supplements 3 times a day. Will check CBC on following visit.  4. Essential hypertension Blood pressure is well-controlled continued current meds  - COMPLETE METABOLIC PANEL WITH GFR  5. Prediabetes Recent hemoglobin A1c was 5.7%, I have advised patient for low carbohydrate diet.  6. Nasal congestion  - fluticasone (FLONASE) 50 MCG/ACT nasal spray; Place 2 sprays into both nostrils daily.  Dispense: 16 g; Refill: 2  7. Excess ear wax, right  - carbamide peroxide (DEBROX) 6.5 % otic solution; Place 5 drops into both ears 2 (two) times daily.  Dispense: 15 mL; Refill: 1  8. Encounter for screening mammogram for breast cancer  - MM DIGITAL SCREENING BILATERAL; Future  Health Maintenance  -Mammogram:ordered   Return in about 3 months (around 02/17/2015), or if symptoms worsen or fail to improve.    The patient was given clear instructions to go to ER or return to medical center if symptoms don't improve, worsen or new problems develop. The patient verbalized understanding. The patient was told to call to get lab results if they haven't heard anything in the next week.    This note has been created with Education officer, environmental. Any transcriptional errors are unintentional.   Cadon Raczka,  MD

## 2014-11-18 ENCOUNTER — Telehealth: Payer: Self-pay

## 2014-11-18 ENCOUNTER — Telehealth: Payer: Self-pay | Admitting: Internal Medicine

## 2014-11-18 NOTE — Telephone Encounter (Signed)
Pt returning nurse's call, also has question about medication for anemia. Please f/u with pt/ pt can be reached on new number [phone].

## 2014-11-18 NOTE — Telephone Encounter (Signed)
Pt called requesting to speak to nurse regarding iron supplement, patient would like to know what brand she should buy. Please f/u with patient

## 2014-11-18 NOTE — Telephone Encounter (Signed)
Patient not available Unable to leave message  No voice mail at this number

## 2014-11-18 NOTE — Telephone Encounter (Signed)
-----   Message from Doris Cheadle, MD sent at 11/18/2014 11:42 AM EDT ----- Blood work reviewed, call and let the patient know that her creatinine is borderline elevated, advise patient to avoid taking any over-the-counter NSAIDs including Aleve, ibuprofen, will repeat blood chemistry on the following visit.

## 2014-11-18 NOTE — Telephone Encounter (Signed)
Returned patient phone call Patient not available Left message on voice mail to return our call 

## 2014-11-23 ENCOUNTER — Encounter: Payer: Self-pay | Admitting: Nurse Practitioner

## 2014-11-23 ENCOUNTER — Ambulatory Visit (INDEPENDENT_AMBULATORY_CARE_PROVIDER_SITE_OTHER): Payer: No Typology Code available for payment source | Admitting: Nurse Practitioner

## 2014-11-23 ENCOUNTER — Ambulatory Visit (HOSPITAL_COMMUNITY): Payer: No Typology Code available for payment source

## 2014-11-23 VITALS — BP 110/88 | HR 108 | Ht 67.0 in | Wt 219.1 lb

## 2014-11-23 DIAGNOSIS — I429 Cardiomyopathy, unspecified: Secondary | ICD-10-CM

## 2014-11-23 DIAGNOSIS — Z9889 Other specified postprocedural states: Secondary | ICD-10-CM

## 2014-11-23 DIAGNOSIS — I428 Other cardiomyopathies: Secondary | ICD-10-CM

## 2014-11-23 DIAGNOSIS — I5022 Chronic systolic (congestive) heart failure: Secondary | ICD-10-CM

## 2014-11-23 LAB — BASIC METABOLIC PANEL
BUN: 42 mg/dL — ABNORMAL HIGH (ref 6–23)
CO2: 20 mEq/L (ref 19–32)
Calcium: 9.9 mg/dL (ref 8.4–10.5)
Chloride: 103 mEq/L (ref 96–112)
Creatinine, Ser: 1.86 mg/dL — ABNORMAL HIGH (ref 0.40–1.20)
GFR: 30.67 mL/min — ABNORMAL LOW (ref >60.00)
Glucose, Bld: 91 mg/dL (ref 70–99)
Potassium: 4.5 mEq/L (ref 3.5–5.1)
Sodium: 136 mEq/L (ref 135–145)

## 2014-11-23 LAB — BRAIN NATRIURETIC PEPTIDE: Pro B Natriuretic peptide (BNP): 32 pg/mL (ref 0.0–100.0)

## 2014-11-23 MED ORDER — FUROSEMIDE 20 MG PO TABS: 20.0000 mg | ORAL_TABLET | Freq: Every day | ORAL | Status: DC | PRN

## 2014-11-23 NOTE — Patient Instructions (Addendum)
We will be checking the following labs today - BMET and BNP   Medication Instructions:    Continue with your current medicines.   I am stopping your Lasix (furosemide) - use this only if needed- if your swelling comes back  I want you to get an Iron tablet (ferrous sulfate) 324 mg - start on one a day    Testing/Procedures To Be Arranged:  N/A  Follow-Up:   I will see you back in 2 to 3 weeks  OV with Dr. [name] in 2 months   Other Special Instructions:   Restrict salt to less than 1500 mg per day  Daily weight - take a dose of Lasix if weight goes up over 3 pounds over one night or if you have swelling.  Call the [facility] office at [phone] if you have any questions, problems or concerns.

## 2014-11-23 NOTE — Progress Notes (Signed)
CARDIOLOGY OFFICE NOTE  Date:  11/23/2014    Abigail Sharp Date of Birth: September 13, 1965 Medical Record #[id number]  PCP:  No primary care provider on file.  Cardiologist:  [name] (NEW)    Chief Complaint  Patient presents with  . NICM follow up    Seen for Dr. [name]    History of Present Illness: Abigail Sharp is a 49 y.o. female who presents today for a post hospital visit. Seen for Dr. [name]. She has a history of HTN, microcytic anemia and presented to [facility] earlier this month with a 3 week history of dyspnea.  She was admitted with CHF. Echo with EF of 25%. Cardiac cath showed no significant CAD - felt to have NICM. Started on CHF regimen. Will need echo in 3 months to reassess EF and proceed with possible ICD implant.   Comes in today. Here alone. Crying profusely. Says she can't get her medicines. Hyperventilating. Says she can't go out of her house - too scared. Very emotional and she admits that she is emotional. Lives alone - son in [place]. Does not work - not disabled. Seen by [employer]. Very upset about not having her iron yet. At first she says she is not on any of her medicines but then she clarifies that she is taking her heart medicine. Says the fluid "is all gone". She is dizzy and lightheaded. Says this is constant. Clutching her chest. History is very hard to sort. She has just started her menses.  Past Medical History  Diagnosis Date  . NICM (nonischemic cardiomyopathy) 11/2014    EF is 25%  . Chronic systolic heart failure   . HTN (hypertension)   . Microcytic anemia   . Pre-diabetes     Past Surgical History  Procedure Laterality Date  . Tonsillectomy    . Tubal ligation    . Cardiac catheterization N/A 11/07/2014    Procedure: Right/Left Heart Cath and Coronary Angiography;  Surgeon: Runell Gess, MD;  Location: [facility] INVASIVE CV LAB;  Service: Cardiovascular;  Laterality: N/A;     Medications: Current Outpatient Prescriptions  Medication Sig  Dispense Refill  . aspirin EC 81 MG EC tablet Take 1 tablet (81 mg total) by mouth daily. 30 tablet 0  . carbamide peroxide (DEBROX) 6.5 % otic solution Place 5 drops into both ears 2 (two) times daily. 15 mL 1  . carvedilol (COREG) 6.25 MG tablet Take 1 tablet (6.25 mg total) by mouth 2 (two) times daily with a meal. 60 tablet 0  . fluticasone (FLONASE) 50 MCG/ACT nasal spray Place 2 sprays into both nostrils daily. 16 g 2  . furosemide (LASIX) 20 MG tablet Take 1 tablet (20 mg total) by mouth daily as needed (for swelling). 30 tablet 0  . lisinopril (PRINIVIL,ZESTRIL) 5 MG tablet Take 1 tablet (5 mg total) by mouth daily. 30 tablet 0  . spironolactone (ALDACTONE) 25 MG tablet Take 1 tablet (25 mg total) by mouth daily. 30 tablet 0   No current facility-administered medications for this visit.    Allergies: Allergies  Allergen Reactions  . Sulfa Antibiotics Other (See Comments)    unknown    Social History: The patient  reports that she has never smoked. She does not have any smokeless tobacco history on file. She reports that she does not drink alcohol or Abigail illicit drugs.   Family History: The patient's family history includes Diabetes in her father, mother, and sister; Heart disease in her father; Hypertension  in her father and mother.   Review of Systems: Please see the history of present illness.   Otherwise, the review of systems is positive for none.   All other systems are reviewed and negative.   Physical Exam: VS:  BP 110/88 mmHg  Pulse 108  Ht  (1.702 m)  Wt 219 lb 1.9 oz (99.392 kg)  BMI 34.31 kg/m2  SpO2 100%  LMP 10/19/2014 (Exact Date) .  BMI Body mass index is 34.31 kg/(m^2).  Wt Readings from Last 3 Encounters:  11/23/14 219 lb 1.9 oz (99.392 kg)  11/17/14 228 lb 12.8 oz (103.783 kg)  11/09/14 238 lb 1.6 oz (108.001 kg)    BP by me is 100/60  Weight is down 19 pounds since discharge.   General: She is actually quite hysterical. Anxious. She is  crying. She is hyperventilating. She does calm down with just talking to her.  HEENT: Normal. Neck: Supple, no JVD, carotid bruits, or masses noted.  Cardiac: Regular rate and rhythm. Rate a little fast. No murmurs, rubs, or gallops. No edema.  Respiratory:  Lungs are clear to auscultation bilaterally with normal work of breathing.  GI: Soft and nontender. Obese MS: No deformity or atrophy. Gait and ROM intact. Skin: Warm and dry. Color is normal.  Neuro:  Strength and sensation are intact and no gross focal deficits noted.  Psych: Alert, fairly appropriate but quite emotional.   LABORATORY DATA:  EKG:  EKG is ordered today. This demonstrates NSR.  Lab Results  Component Value Date   WBC 5.6 11/08/2014   HGB 10.5* 11/08/2014   HCT 34.1* 11/08/2014   PLT 254 11/08/2014   GLUCOSE 75 11/17/2014   CHOL 121 11/05/2014   TRIG 81 11/05/2014   HDL 26* 11/05/2014   LDLCALC 79 11/05/2014   ALT 20 11/17/2014   AST 32 11/17/2014   NA 138 11/17/2014   K 4.3 11/17/2014   CL 102 11/17/2014   CREATININE 1.26* 11/17/2014   BUN 40* 11/17/2014   CO2 22 11/17/2014   TSH 1.192 11/04/2014   INR 1.17 11/07/2014   HGBA1C 5.7* 11/05/2014    BNP (last 3 results)  Recent Labs  11/03/14 1800  BNP 1168.3*    ProBNP (last 3 results) No results for input(s): PROBNP in the last 8760 hours.   Other Studies Reviewed Today:   Echo Study Conclusions from 11/04/2014  - Left ventricle: The cavity size was mildly dilated. Wall thickness was increased in a pattern of mild LVH. The estimated ejection fraction was 25%. Diffuse hypokinesis. Findings consistent with left ventricular diastolic dysfunction. - Mitral valve: There was moderate to severe regurgitation. - Left atrium: The atrium was moderately dilated. - Right ventricle: The cavity size was mildly dilated. Systolic function was moderately to severely reduced. - Right atrium: The atrium was mildly dilated. - Pulmonary arteries: PA  peak pressure: 40 mm Hg (S). - Pericardium, extracardiac: A trivial pericardial effusion was identified posterior to the heart.   Cardiac catheterization done on 11/07/2014 by Dr. Allyson Sabal IMPRESSION:normal coronary arteries with elevated filling pressures consistent with dilated nonischemic coronary myopathy. Medical therapy will be recommended. The patient should have a repeat 2-D echo in 3 months to assess improvement in LV function. She will may ultimately require an ICD for primary prevention. Afebrile angina was performed and a minx closure device was placed.    Assessment/Plan: 1. NICM - EF of 25% - weight is way down. She is dizzy. I actually had to give her  a soda and some crackers here in the office to calm her down. Changing Lasix to just prn. Check labs. See back in 2 to 3 weeks. Echo after September 3rd.   2. HTN - BP much lower now - I suspect she has been over diuresed.   3. Microcytic anemia - she will pick up some OTC iron - I would start just one a day to avoid constipation.   She will need close follow up. She is very emotional. I do not think she would tolerate an ICD very well but will see.   Current medicines are reviewed with the patient today.  The patient does not have concerns regarding medicines other than what has been noted above.  The following changes have been made:  See above.  Labs/ tests ordered today include:    Orders Placed This Encounter  Procedures  . Basic metabolic panel  . Brain natriuretic peptide     Disposition:   FU with me in 2 to 3  weeks.   Patient is agreeable to this plan and will call if any problems develop in the interim.   Signed: Rosalio Macadamia, RN, ANP-C 11/23/2014 3:56 PM  [facility] [address] [place], [place]  [address] Phone: [phone] Fax: [phone]

## 2014-11-24 ENCOUNTER — Encounter: Payer: No Typology Code available for payment source | Admitting: Cardiology

## 2014-12-06 ENCOUNTER — Ambulatory Visit (INDEPENDENT_AMBULATORY_CARE_PROVIDER_SITE_OTHER): Payer: No Typology Code available for payment source | Admitting: Nurse Practitioner

## 2014-12-06 ENCOUNTER — Encounter: Payer: Self-pay | Admitting: Nurse Practitioner

## 2014-12-06 VITALS — BP 112/69 | HR 65 | Resp 22 | Ht 67.0 in | Wt 211.0 lb

## 2014-12-06 DIAGNOSIS — I428 Other cardiomyopathies: Secondary | ICD-10-CM

## 2014-12-06 DIAGNOSIS — I429 Cardiomyopathy, unspecified: Secondary | ICD-10-CM | POA: Diagnosis not present

## 2014-12-06 LAB — BASIC METABOLIC PANEL
BUN: 31 mg/dL — ABNORMAL HIGH (ref 6–23)
CO2: 21 mEq/L (ref 19–32)
Calcium: 9.8 mg/dL (ref 8.4–10.5)
Chloride: 102 mEq/L (ref 96–112)
Creatinine, Ser: 1.72 mg/dL — ABNORMAL HIGH (ref 0.40–1.20)
GFR: 33.57 mL/min — ABNORMAL LOW (ref >60.00)
Glucose, Bld: 99 mg/dL (ref 70–99)
Potassium: 4.7 mEq/L (ref 3.5–5.1)
Sodium: 134 mEq/L — ABNORMAL LOW (ref 135–145)

## 2014-12-06 LAB — CBC
HCT: 44.6 % (ref 36.0–46.0)
Hemoglobin: 13.9 g/dL (ref 12.0–15.0)
MCHC: 31.3 g/dL (ref 30.0–36.0)
MCV: 70.4 fl — ABNORMAL LOW (ref 78.0–100.0)
Platelets: 241 10*3/uL (ref 150.0–400.0)
RBC: 6.33 Mil/uL — ABNORMAL HIGH (ref 3.87–5.11)
RDW: 22.7 % — ABNORMAL HIGH (ref 11.5–15.5)
WBC: 10.5 10*3/uL (ref 4.0–10.5)

## 2014-12-06 LAB — BRAIN NATRIURETIC PEPTIDE: Pro B Natriuretic peptide (BNP): 16 pg/mL (ref 0.0–100.0)

## 2014-12-06 NOTE — Progress Notes (Signed)
CARDIOLOGY OFFICE NOTE  Date:  12/06/2014    Abigail Sharp Date of Birth: 1965/09/17 Medical Record #[id number]  PCP:  No PCP Per Patient  Cardiologist:  [name]    Chief Complaint  Patient presents with  . Congestive Heart Failure    History of Present Illness: Abigail Sharp is a 49 y.o. female who presents today for a follow up visit. Seen for Dr. [name]. She has a history of HTN, microcytic anemia and presented to [facility] earlier this month with a 3 week history of dyspnea.  She was admitted with CHF last month. Echo with EF of 25%. Cardiac cath showed no significant CAD - felt to have NICM. Started on CHF regimen. Will need echo in 3 months to reassess EF and proceed with possible ICD implant.   Seen 2 weeks ago - VERY emotional. Crying profusely. Not on all of her medicines.   Comes in today. Here alone. VERY emotional again. Crying. Short of breath. Nauseated. Says she can't eat. Weight is down another 9 pounds. No cough. Constipated. Just wants to lie down. No medicines taken today. Did not take any of her medicines Monday. Says she took them on Sunday but admits that she gets very mixed up. Did not get her iron pills - too dizzy she says to go to the pharmacy. Not using any lasix. Her groin is sore and "rashy".   Past Medical History  Diagnosis Date  . NICM (nonischemic cardiomyopathy) 11/2014    EF is 25%  . Chronic systolic heart failure   . HTN (hypertension)   . Microcytic anemia   . Pre-diabetes     Past Surgical History  Procedure Laterality Date  . Tonsillectomy    . Tubal ligation    . Cardiac catheterization N/A 11/07/2014    Procedure: Right/Left Heart Cath and Coronary Angiography;  Surgeon: Runell Gess, MD;  Location: [facility] INVASIVE CV LAB;  Service: Cardiovascular;  Laterality: N/A;     Medications: Current Outpatient Prescriptions  Medication Sig Dispense Refill  . aspirin EC 81 MG EC tablet Take 1 tablet (81 mg total) by mouth daily. 30 tablet 0  .  carvedilol (COREG) 6.25 MG tablet Take 1 tablet (6.25 mg total) by mouth 2 (two) times daily with a meal. 60 tablet 0  . furosemide (LASIX) 20 MG tablet Take 1 tablet (20 mg total) by mouth daily as needed (for swelling). (Patient not taking: Reported on 12/06/2014) 30 tablet 0   No current facility-administered medications for this visit.    Allergies: Allergies  Allergen Reactions  . Sulfa Antibiotics Other (See Comments)    unknown    Social History: The patient  reports that she has never smoked. She does not have any smokeless tobacco history on file. She reports that she does not drink alcohol or use illicit drugs.   Family History: The patient's family history includes Diabetes in her father, mother, and sister; Heart disease in her father; Hypertension in her father and mother.   Review of Systems: Please see the history of present illness.   Otherwise, the review of systems is positive for none.   All other systems are reviewed and negative.   Physical Exam: VS:  BP 112/69 mmHg  Pulse 65  Resp 22  Ht  (1.702 m)  Wt 211 lb (95.709 kg)  BMI 33.04 kg/m2  SpO2 98% .  BMI Body mass index is 33.04 kg/(m^2).  Wt Readings from Last 3 Encounters:  12/06/14 211  lb (95.709 kg)  11/23/14 219 lb 1.9 oz (99.392 kg)  11/17/14 228 lb 12.8 oz (103.783 kg)    General: She remains very emotional. Crying again. She is in no acute distress.  HEENT: Normal. Neck: Supple, no JVD, carotid bruits, or masses noted.  Cardiac: She is tachycardic. +S3. No edema.  Respiratory:  Lungs are clear to auscultation bilaterally with normal work of breathing.  GI: Soft and nontender.  MS: No deformity or atrophy. Gait and ROM intact. Skin: Warm and dry. Color is normal. She has what looks to be a heat rash at the umbilicus and extensively in the groins.  Neuro:  Strength and sensation are intact and no gross focal deficits noted.  Psych: Alert, appropriate but with an inappropriate  affect.   LABORATORY DATA:  EKG:  EKG is ordered today. This shows sinus tachycardia  Lab Results  Component Value Date   WBC 5.6 11/08/2014   HGB 10.5* 11/08/2014   HCT 34.1* 11/08/2014   PLT 254 11/08/2014   GLUCOSE 91 11/23/2014   CHOL 121 11/05/2014   TRIG 81 11/05/2014   HDL 26* 11/05/2014   LDLCALC 79 11/05/2014   ALT 20 11/17/2014   AST 32 11/17/2014   NA 136 11/23/2014   K 4.5 11/23/2014   CL 103 11/23/2014   CREATININE 1.86* 11/23/2014   BUN 42* 11/23/2014   CO2 20 11/23/2014   TSH 1.192 11/04/2014   INR 1.17 11/07/2014   HGBA1C 5.7* 11/05/2014    BNP (last 3 results)  Recent Labs  11/03/14 1800  BNP 1168.3*    ProBNP (last 3 results)  Recent Labs  11/23/14 1555  PROBNP 32.0     Other Studies Reviewed Today:  Echo Study Conclusions from 11/04/2014  - Left ventricle: The cavity size was mildly dilated. Wall thickness was increased in a pattern of mild LVH. The estimated ejection fraction was 25%. Diffuse hypokinesis. Findings consistent with left ventricular diastolic dysfunction. - Mitral valve: There was moderate to severe regurgitation. - Left atrium: The atrium was moderately dilated. - Right ventricle: The cavity size was mildly dilated. Systolic function was moderately to severely reduced. - Right atrium: The atrium was mildly dilated. - Pulmonary arteries: PA peak pressure: 40 mm Hg (S). - Pericardium, extracardiac: A trivial pericardial effusion was identified posterior to the heart.   Cardiac catheterization done on 11/07/2014 by Dr. Allyson Sabal IMPRESSION:normal coronary arteries with elevated filling pressures consistent with dilated nonischemic coronary myopathy. Medical therapy will be recommended. The patient should have a repeat 2-D echo in 3 months to assess improvement in LV function. She will may ultimately require an ICD for primary prevention. Afebrile angina was performed and a minx closure device was  placed.    Assessment/Plan: 1. NICM - EF of 25% - weight continues to fall. She is not tolerating her current regimen. Remains dizzy. Remains short of breath - last BNP was only 32. Remains tachycardic. I have stopped her ACE and aldactone and have left her only on Coreg which she may not be able to tolerate. Very difficult situation overall and her options may be very limited. No support at home as well.   2. HTN - BP remains low - she has not had any medicine since Sunday.   3. Microcytic anemia - she has not picked up her OTC iron - again, due to her social situation.  4. Rash - most likely yeast/heat related - needs to get some antifungal cream and use.   She will  continue to need close follow up. She is very emotional. I do not think she would tolerate an ICD very well but will see. Overall situation is quite tenuous at best.   Current medicines are reviewed with the patient today.  The patient does not have concerns regarding medicines other than what has been noted above.  The following changes have been made:  See above.  Labs/ tests ordered today include:    Orders Placed This Encounter  Procedures  . Brain natriuretic peptide  . Basic metabolic panel  . CBC  . EKG 12-Lead     Disposition:   FU with me next week.   Patient is agreeable to this plan and will call if any problems develop in the interim.   Signed: Rosalio Macadamia, RN, ANP-C 12/06/2014 12:31 PM  [facility] [address] [place], [place]  [address] Phone: [phone] Fax: [phone]

## 2014-12-06 NOTE — Patient Instructions (Signed)
We will be checking the following labs today - BMET, CBC, BNP  Medication Instructions:    Continue with your current medicines but  I am stopping the Lisinopril  I am stopping the Aldactone  If you can get your iron pills - do so  Get some antifungal cream and put around your belly button and in your groins - leave open to air  Follow this list of medicines   Testing/Procedures To Be Arranged:  N/A  Follow-Up:   See me in next Wednesday, July 13 at 11:30 am   Other Special Instructions:   N/A  Call the [facility] office at [phone] if you have any questions, problems or concerns.

## 2014-12-14 ENCOUNTER — Ambulatory Visit (INDEPENDENT_AMBULATORY_CARE_PROVIDER_SITE_OTHER): Payer: No Typology Code available for payment source | Admitting: Nurse Practitioner

## 2014-12-14 ENCOUNTER — Encounter: Payer: Self-pay | Admitting: Nurse Practitioner

## 2014-12-14 VITALS — BP 110/68 | HR 109 | Ht 67.0 in | Wt 209.4 lb

## 2014-12-14 DIAGNOSIS — I5022 Chronic systolic (congestive) heart failure: Secondary | ICD-10-CM

## 2014-12-14 DIAGNOSIS — I429 Cardiomyopathy, unspecified: Secondary | ICD-10-CM | POA: Diagnosis not present

## 2014-12-14 DIAGNOSIS — I428 Other cardiomyopathies: Secondary | ICD-10-CM

## 2014-12-14 LAB — BASIC METABOLIC PANEL
BUN: 14 mg/dL (ref 6–23)
CO2: 23 mEq/L (ref 19–32)
Calcium: 9.7 mg/dL (ref 8.4–10.5)
Chloride: 101 mEq/L (ref 96–112)
Creatinine, Ser: 1.48 mg/dL — ABNORMAL HIGH (ref 0.40–1.20)
GFR: 39.92 mL/min — ABNORMAL LOW (ref >60.00)
Glucose, Bld: 98 mg/dL (ref 70–99)
Potassium: 4.4 mEq/L (ref 3.5–5.1)
Sodium: 135 mEq/L (ref 135–145)

## 2014-12-14 NOTE — Progress Notes (Addendum)
CARDIOLOGY OFFICE NOTE  Date:  12/14/2014    Abigail Sharp Date of Birth: 07-Oct-1965 Medical Record #[id number]  PCP:  No PCP Per Patient  Cardiologist:  [name]    Chief Complaint  Patient presents with  . FU for NICM    Seen for Dr. [name]    History of Present Illness: Abigail Sharp is a 49 y.o. female who presents today for a follow up visit. Seen for Dr. [name]. She has a history of HTN, microcytic anemia and presented to [facility] in June of 2016 with a 3 week history of dyspnea.  She was admitted with CHF in June.  Echo with EF of 25%. Cardiac cath showed no significant CAD - felt to have NICM. Started on CHF regimen. Will need echo in 3 months to reassess EF and proceed with possible ICD implant.   I have seen her twice since her admission. VERY emotional. Crying profusely. Not on all of her medicines. Not able to tolerate her medicines due to dizziness and worsening kidney function. We have had to stop almost everything. Looked like she got over diuresed.   Comes in today. Here alone. Not as emotional today and actually she says she is better. She has still NOT gone to the pharmacy to pick up the cream that I had advised at her last visit - says the heat rash is gone. No chest pain. Remains short of breath. She has not had any medicines today. I am not convinced that she is even taking her medicines. Nightsweat last night. Still having menses. Asking about social security. Getting government assistance for housing. Son pays utilities. Admits to lots of depression reported. Has not been able to work "in many years". Says she is suppose to serve on Jury Duty next month - I do not think that will be possible for her.    Past Medical History  Diagnosis Date  . NICM (nonischemic cardiomyopathy) 11/2014    EF is 25%  . Chronic systolic heart failure   . HTN (hypertension)   . Microcytic anemia   . Pre-diabetes     Past Surgical History  Procedure Laterality Date  . Tonsillectomy     . Tubal ligation    . Cardiac catheterization N/A 11/07/2014    Procedure: Right/Left Heart Cath and Coronary Angiography;  Surgeon: Runell Gess, MD;  Location: [facility] INVASIVE CV LAB;  Service: Cardiovascular;  Laterality: N/A;     Medications: Current Outpatient Prescriptions  Medication Sig Dispense Refill  . aspirin EC 81 MG EC tablet Take 1 tablet (81 mg total) by mouth daily. 30 tablet 0  . carvedilol (COREG) 6.25 MG tablet Take 1 tablet (6.25 mg total) by mouth 2 (two) times daily with a meal. 60 tablet 0  . furosemide (LASIX) 20 MG tablet Take 1 tablet (20 mg total) by mouth daily as needed (for swelling). 30 tablet 0   No current facility-administered medications for this visit.    Allergies: Allergies  Allergen Reactions  . Sulfa Antibiotics Other (See Comments)    unknown    Social History: The patient  reports that she has never smoked. She does not have any smokeless tobacco history on file. She reports that she does not drink alcohol or use illicit drugs.   Family History: The patient's family history includes Diabetes in her father, mother, and sister; Heart disease in her father; Hypertension in her father and mother.   Review of Systems: Please see the history of present  illness.   Otherwise, the review of systems is positive for none.   All other systems are reviewed and negative.   Physical Exam: VS:  BP 110/68 mmHg  Pulse 109  Ht 5\' 7"  (1.702 m)  Wt 209 lb 6.4 oz (94.983 kg)  BMI 32.79 kg/m2  SpO2 98% .  BMI Body mass index is 32.79 kg/(m^2).  Wt Readings from Last 3 Encounters:  12/14/14 209 lb 6.4 oz (94.983 kg)  12/06/14 211 lb (95.709 kg)  11/23/14 219 lb 1.9 oz (99.392 kg)    General: Alert. She is in no acute distress. Smells heavily of body odor. Weight is down 2 pounds.  HEENT: Normal. Neck: Supple, no JVD, carotid bruits, or masses noted.  Cardiac: Regular rate and rhythm. +S3. No edema.  Respiratory:  Lungs are clear to auscultation  bilaterally with normal work of breathing.  GI: Soft and nontender.  MS: No deformity or atrophy. Gait and ROM intact. Skin: Warm and dry. Color is normal.  Neuro:  Strength and sensation are intact and no gross focal deficits noted.  Psych: Alert, appropriate and with normal affect.   LABORATORY DATA:  EKG:  EKG is not ordered today.   Lab Results  Component Value Date   WBC 10.5 12/06/2014   HGB 13.9 12/06/2014   HCT 44.6 12/06/2014   PLT 241.0 12/06/2014   GLUCOSE 99 12/06/2014   CHOL 121 11/05/2014   TRIG 81 11/05/2014   HDL 26* 11/05/2014   LDLCALC 79 11/05/2014   ALT 20 11/17/2014   AST 32 11/17/2014   NA 134* 12/06/2014   K 4.7 12/06/2014   CL 102 12/06/2014   CREATININE 1.72* 12/06/2014   BUN 31* 12/06/2014   CO2 21 12/06/2014   TSH 1.192 11/04/2014   INR 1.17 11/07/2014   HGBA1C 5.7* 11/05/2014    BNP (last 3 results)  Recent Labs  11/03/14 1800  BNP 1168.3*    ProBNP (last 3 results)  Recent Labs  11/23/14 1555 12/06/14 1221  PROBNP 32.0 16.0     Other Studies Reviewed Today:  Echo Study Conclusions from 11/04/2014  - Left ventricle: The cavity size was mildly dilated. Wall thickness was increased in a pattern of mild LVH. The estimated ejection fraction was 25%. Diffuse hypokinesis. Findings consistent with left ventricular diastolic dysfunction. - Mitral valve: There was moderate to severe regurgitation. - Left atrium: The atrium was moderately dilated. - Right ventricle: The cavity size was mildly dilated. Systolic function was moderately to severely reduced. - Right atrium: The atrium was mildly dilated. - Pulmonary arteries: PA peak pressure: 40 mm Hg (S). - Pericardium, extracardiac: A trivial pericardial effusion was identified posterior to the heart.   Cardiac catheterization done on 11/07/2014 by Dr. Allyson Sabal IMPRESSION:normal coronary arteries with elevated filling pressures consistent with dilated nonischemic coronary  myopathy. Medical therapy will be recommended. The patient should have a repeat 2-D echo in 3 months to assess improvement in LV function. She will may ultimately require an ICD for primary prevention. Afebrile angina was performed and a minx closure device was placed.    Assessment/Plan: 1. NICM - EF of 25% - weight continues to fall. She has not tolerated her CHF regimen. I am not convinced that she is even taking her Coreg - admits that she has not had any medicine today. She has MR as well on echo - this may be her issue. Will ask Dr. [name] to review. ? If she needs TEE. Will get her back to  see him. Recheck BMET today.  2. HTN - BP satisfactory. She has had most of her other medicines stopped.  3. Microcytic anemia - she has not picked up her OTC iron - again, due to her social situation.  4. Rash - says this is resolved.  5. Moderate to severe MR - I wonder if she needs a TEE - will get Dr. Michaelle Copas input.   Current medicines are reviewed with the patient today.  The patient does not have concerns regarding medicines other than what has been noted above.  The following changes have been made:  See above.  Labs/ tests ordered today include:    Orders Placed This Encounter  Procedures  . Basic metabolic panel    Disposition:   FU with Dr. [name] as planned.    Patient is agreeable to this plan and will call if any problems develop in the interim.   Signed: Rosalio Macadamia, RN, ANP-C 12/14/2014 12:29 PM  [facility] [address] [place], [place]  [address] Phone: [phone] Fax: [phone]       Message from Dr. [name] regarding TEE: TEE is okay but would not do until 90 days of therapy     ----- Message -----     From: Rosalio Macadamia, NP     Sent: 12/14/2014 12:31 PM      To: Lyn Records, MD

## 2014-12-14 NOTE — Patient Instructions (Addendum)
We will be checking the following labs today - BMET   Medication Instructions:    Continue with your current medicines.     Testing/Procedures To Be Arranged:  N/A  Follow-Up:   See Dr. [name] next month     Other Special Instructions:   N/A  Call the [facility] office at [phone] if you have any questions, problems or concerns.

## 2014-12-23 ENCOUNTER — Telehealth: Payer: Self-pay | Admitting: *Deleted

## 2014-12-23 ENCOUNTER — Encounter: Payer: Self-pay | Admitting: Nurse Practitioner

## 2014-12-23 ENCOUNTER — Other Ambulatory Visit: Payer: Self-pay | Admitting: *Deleted

## 2014-12-23 MED ORDER — FUROSEMIDE 20 MG PO TABS: 20.0000 mg | ORAL_TABLET | Freq: Every day | ORAL | Status: DC | PRN

## 2014-12-23 MED ORDER — CARVEDILOL 6.25 MG PO TABS: 6.2500 mg | ORAL_TABLET | Freq: Two times a day (BID) | ORAL | Status: DC

## 2014-12-23 NOTE — Telephone Encounter (Signed)
Norma Fredrickson, NP, wrote letter to excuse pt of jury duty. Will leave at the front and inform pt it is ready for pick up.  Also refilled Lasix and Coreg per pt's request.

## 2015-01-01 NOTE — Progress Notes (Signed)
Cardiology Office Note   Date:  01/02/2015   ID:  Abigail Sharp, DOB 1966/04/17, MRN [id number]  PCP:  No PCP Per Patient  Cardiologist:  Lesleigh Noe, MD   Chief Complaint  Patient presents with  . Congestive Heart Failure      History of Present Illness: Abigail Sharp is a 49 y.o. female who presents for essential hypertension, nonischemic cardiomyopathy, and medication side effects.  She states that since her medications have been trimmed down she feels somewhat better. Dyspnea has not returned. She is unable to give a coherent history because of distraction. She denies chest pain. She has no significant coronary disease.  Past Medical History  Diagnosis Date  . NICM (nonischemic cardiomyopathy) 11/2014    EF is 25%  . Chronic systolic heart failure   . HTN (hypertension)   . Microcytic anemia   . Pre-diabetes     Past Surgical History  Procedure Laterality Date  . Tonsillectomy    . Tubal ligation    . Cardiac catheterization N/A 11/07/2014    Procedure: Right/Left Heart Cath and Coronary Angiography;  Surgeon: Runell Gess, MD;  Location: [facility] INVASIVE CV LAB;  Service: Cardiovascular;  Laterality: N/A;     Current Outpatient Prescriptions  Medication Sig Dispense Refill  . aspirin EC 81 MG EC tablet Take 1 tablet (81 mg total) by mouth daily. 30 tablet 0  . carvedilol (COREG) 6.25 MG tablet Take 1 tablet (6.25 mg total) by mouth 2 (two) times daily with a meal. 60 tablet 11  . spironolactone (ALDACTONE) 25 MG tablet Take 1 tablet (25 mg total) by mouth daily. 30 tablet 11   No current facility-administered medications for this visit.    Allergies:   Sulfa antibiotics    Social History:  The patient  reports that she has never smoked. She does not have any smokeless tobacco history on file. She reports that she does not drink alcohol or use illicit drugs.   Family History:  The patient's family history includes Diabetes in her father, mother, and  sister; Heart disease in her father; Hypertension in her father and mother.    ROS:  Please see the history of present illness.   Otherwise, review of systems are positive for shortness of breath, visual disturbance, appetite change, dizziness, depression, and anxiety..   All other systems are reviewed and negative.    PHYSICAL EXAM: VS:  BP 160/100 mmHg  Pulse 100  Ht 5\' 7"  (1.702 m)  Wt 99.791 kg (220 lb)  BMI 34.45 kg/m2  SpO2 97% , BMI Body mass index is 34.45 kg/(m^2). GEN: Well nourished, well developed, in no acute distress HEENT: normal Neck: no JVD, carotid bruits, or masses Cardiac: RRR; no murmurs, rubs, or gallops,no edema  Respiratory:  clear to auscultation bilaterally, normal work of breathing GI: soft, nontender, nondistended, + BS MS: no deformity or atrophy Skin: warm and dry, no rash Neuro:  Strength and sensation are intact Psych: euthymic mood, full affect   EKG:  EKG is not ordered today.    Recent Labs: 11/03/2014: B Natriuretic Peptide 1168.3* 11/04/2014: TSH 1.192 11/06/2014: Magnesium 1.9 11/17/2014: ALT 20 12/06/2014: Hemoglobin 13.9; Platelets 241.0; Pro B Natriuretic peptide (BNP) 16.0 12/14/2014: BUN 14; Creatinine, Ser 1.48*; Potassium 4.4; Sodium 135    Lipid Panel    Component Value Date/Time   CHOL 121 11/05/2014 0347   TRIG 81 11/05/2014 0347   HDL 26* 11/05/2014 0347   CHOLHDL 4.7 11/05/2014 0347  VLDL 16 11/05/2014 0347   LDLCALC 79 11/05/2014 0347      Wt Readings from Last 3 Encounters:  01/02/15 99.791 kg (220 lb)  12/14/14 94.983 kg (209 lb 6.4 oz)  12/06/14 95.709 kg (211 lb)      Other studies Reviewed: Additional studies/ records that were reviewed today include: . Review of the above records demonstrates:   Reviewed the recent hospital visit/day and APAP notes with reference to medication adjustments.   ASSESSMENT AND PLAN:  1. Acute combined systolic and diastolic HF (heart failure), NYHA class 2  Seems to be  compensated without evidence of volume overload  2. Cardiomyopathy, EF 25% on Echo  Will need reassessment of LVEF in September/October/November once on a good medical regimen  3. Essential hypertension  Very poorly controlled    Current medicines are reviewed at length with the patient today.  The patient does not have concerns regarding medicines.  The following changes have been made:  Start spironolactone 25 mg per day. Clinical follow-up in one month. I may refer her to heart failure clinic as they are more resources available to help titrate her medications. I will make a decision concerning this on the next office visit. We will discontinue furosemide. 1.5 gram per day sodium diet.  We spent much time attempting to educate and explain the rationale for angina and when an angiotensin blockade. She is somewhat dense with reference to this conversation.  She is concerned that perhaps she needs disability and wonders if this will help her get food stamps.  Labs/ tests ordered today include:   Orders Placed This Encounter  Procedures  . Basic metabolic panel     Disposition:   FU with HS in 1 month  Signed, Lesleigh Noe, MD  01/02/2015 10:40 AM    [facility] [address] [place], [place], [place]  [address] Phone: [phone]; Fax: [phone]

## 2015-01-02 ENCOUNTER — Ambulatory Visit (INDEPENDENT_AMBULATORY_CARE_PROVIDER_SITE_OTHER): Payer: No Typology Code available for payment source | Admitting: Interventional Cardiology

## 2015-01-02 ENCOUNTER — Encounter: Payer: Self-pay | Admitting: Interventional Cardiology

## 2015-01-02 VITALS — BP 160/100 | HR 100 | Ht 67.0 in | Wt 220.0 lb

## 2015-01-02 DIAGNOSIS — I10 Essential (primary) hypertension: Secondary | ICD-10-CM | POA: Diagnosis not present

## 2015-01-02 DIAGNOSIS — I429 Cardiomyopathy, unspecified: Secondary | ICD-10-CM

## 2015-01-02 DIAGNOSIS — I5041 Acute combined systolic (congestive) and diastolic (congestive) heart failure: Secondary | ICD-10-CM

## 2015-01-02 MED ORDER — SPIRONOLACTONE 25 MG PO TABS: 25.0000 mg | ORAL_TABLET | Freq: Every day | ORAL | Status: DC

## 2015-01-02 NOTE — Patient Instructions (Signed)
Medication Instructions:  Your physician has recommended you make the following change in your medication:  1) STOP Lasix 2) START Aldactone 25mg  daily. And Rx has been sent to your pharmacy   Labwork: Your physician recommends that you return for lab work in: 7 days (bmet) 01/09/15  Testing/Procedures: None   Follow-Up: Your physician recommends that you schedule a follow-up appointment on 02/07/15 @ 10:45  Any Other Special Instructions Will Be Listed Below (If Applicable). Limit your sodium intake to 1500mg  daily

## 2015-01-05 ENCOUNTER — Telehealth: Payer: Self-pay | Admitting: Interventional Cardiology

## 2015-01-05 NOTE — Telephone Encounter (Signed)
New message     Pt returning Danielle's call

## 2015-01-09 ENCOUNTER — Other Ambulatory Visit (INDEPENDENT_AMBULATORY_CARE_PROVIDER_SITE_OTHER): Payer: No Typology Code available for payment source | Admitting: *Deleted

## 2015-01-09 DIAGNOSIS — I5041 Acute combined systolic (congestive) and diastolic (congestive) heart failure: Secondary | ICD-10-CM | POA: Diagnosis not present

## 2015-01-09 DIAGNOSIS — I429 Cardiomyopathy, unspecified: Secondary | ICD-10-CM

## 2015-01-09 DIAGNOSIS — I10 Essential (primary) hypertension: Secondary | ICD-10-CM

## 2015-01-10 ENCOUNTER — Telehealth: Payer: Self-pay | Admitting: Nurse Practitioner

## 2015-01-10 ENCOUNTER — Telehealth: Payer: Self-pay | Admitting: *Deleted

## 2015-01-10 LAB — BASIC METABOLIC PANEL
BUN: 17 mg/dL (ref 6–23)
CO2: 27 mEq/L (ref 19–32)
CREATININE: 1.11 mg/dL (ref 0.40–1.20)
Calcium: 9 mg/dL (ref 8.4–10.5)
Chloride: 104 mEq/L (ref 96–112)
GFR: 55.62 mL/min — ABNORMAL LOW (ref >60.00)
Glucose, Bld: 83 mg/dL (ref 70–99)
POTASSIUM: 3.9 meq/L (ref 3.5–5.1)
SODIUM: 138 meq/L (ref 135–145)

## 2015-01-10 NOTE — Telephone Encounter (Signed)
S/w pt about disability papers for jury duty. Kim from medical records stated made pt aware papers were ready for pick up 7/22.  Court is 8/11 Pt stated never received message to pick up just to call our office.  Left another set of papers at the front desk. Pt aware. Stated I left pt message also to call our office back.  Never received a phone call except today.

## 2015-01-10 NOTE — Telephone Encounter (Signed)
Walk in pt form-questions about paperwork-gave to Dawayne Patricia-

## 2015-02-06 NOTE — Progress Notes (Signed)
Cardiology Office Note   Date:  02/07/2015   ID:  Abigail Sharp, DOB [birth year], MRN [id number]  PCP:  No PCP Per Patient  Cardiologist:  Lesleigh Noe, MD   Chief Complaint  Patient presents with  . Congestive Heart Failure      History of Present Illness: Abigail Sharp is a 49 y.o. female who presents for systolic heart failure acute on chronic now converted to chronic, ACE inhibitor and loop diuretic intolerance, morbid obesity, anxiety disorder.  Patient is doing relatively well. He has some question about whether or not her medication regimen is being compliant with. After significant questioning I do believe she is taking carvedilol twice a day and spiral lactone as prescribed. She states that she occasionally gets a feeling as though she is breathing through a straw. She sleeps on her abdomen. There is no peripheral edema. She has not had chest pain.    Past Medical History  Diagnosis Date  . NICM (nonischemic cardiomyopathy) 11/2014    EF is 25%  . Chronic systolic heart failure   . HTN (hypertension)   . Microcytic anemia   . Pre-diabetes     Past Surgical History  Procedure Laterality Date  . Tonsillectomy    . Tubal ligation    . Cardiac catheterization N/A 11/07/2014    Procedure: Right/Left Heart Cath and Coronary Angiography;  Surgeon: Runell Gess, MD;  Location: [facility] INVASIVE CV LAB;  Service: Cardiovascular;  Laterality: N/A;     Current Outpatient Prescriptions  Medication Sig Dispense Refill  . aspirin EC 81 MG EC tablet Take 1 tablet (81 mg total) by mouth daily. 30 tablet 0  . carvedilol (COREG) 6.25 MG tablet Take 1 tablet (6.25 mg total) by mouth 2 (two) times daily with a meal. 60 tablet 11  . fluticasone (FLONASE) 50 MCG/ACT nasal spray Place 2 sprays into both nostrils daily.    Marland Kitchen spironolactone (ALDACTONE) 25 MG tablet Take 1 tablet (25 mg total) by mouth 2 (two) times daily. 60 tablet 5   No current facility-administered medications for  this visit.    Allergies:   Sulfa antibiotics    Social History:  The patient  reports that she has never smoked. She has never used smokeless tobacco. She reports that she does not drink alcohol or use illicit drugs.   Family History:  The patient's family history includes Diabetes in her father, mother, and sister; Heart disease in her father; Hypertension in her father and mother.    ROS:  Please see the history of present illness.   Otherwise, review of systems are positive for inquisitive about medications. Somewhat anxious..   All other systems are reviewed and negative.    PHYSICAL EXAM: VS:  BP 138/98 mmHg  Pulse 104  Ht  (1.702 m)  Wt 101.061 kg (222 lb 12.8 oz)  BMI 34.89 kg/m2  SpO2 97% , BMI Body mass index is 34.89 kg/(m^2). GEN: Well nourished, well developed, in no acute distressPeriod morbid obesity. HEENT: normal Neck: no JVD, carotid bruits, or masses. Cardiac: RRR; no murmurs, rubs, or gallops,no edema  Respiratory:  clear to auscultation bilaterally, normal work of breathing GI: soft, nontender, nondistended, + BS MS: no deformity or atrophy Skin: warm and dry, no rash Neuro:  Strength and sensation are intact Psych: euthymic mood, full affect   EKG:  EKG is not ordered today.   Recent Labs: 11/03/2014: B Natriuretic Peptide 1168.3* 11/04/2014: TSH 1.192 11/06/2014: Magnesium 1.9 11/17/2014: ALT  20 12/06/2014: Hemoglobin 13.9; Platelets 241.0; Pro B Natriuretic peptide (BNP) 16.0 01/09/2015: BUN 17; Creatinine, Ser 1.11; Potassium 3.9; Sodium 138    Lipid Panel    Component Value Date/Time   CHOL 121 11/05/2014 0347   TRIG 81 11/05/2014 0347   HDL 26* 11/05/2014 0347   CHOLHDL 4.7 11/05/2014 0347   VLDL 16 11/05/2014 0347   LDLCALC 79 11/05/2014 0347      Wt Readings from Last 3 Encounters:  02/07/15 101.061 kg (222 lb 12.8 oz)  01/02/15 99.791 kg (220 lb)  12/14/14 94.983 kg (209 lb 6.4 oz)      Other studies Reviewed: Additional  studies/ records that were reviewed today include: .    ASSESSMENT AND PLAN:  1. Acute combined systolic and diastolic HF (heart failure), NYHA class 2 She has been converted to chronic systolic heart failure. No evidence of volume overload. I clinically suspect that LV function is improved despite difficulty with taking an ACE inhibitor therapy loop diuretic.  2. Essential hypertension Mildly elevated diastolic pressure  3. Morbid obesity Complicating her underlying cardiovascular conditions  4. Anxiety disorder Significantly improved since medication adjustments   Current medicines are reviewed at length with the patient today.  The patient does not have concerns regarding medicines.  The following changes have been made:  Increase spironolactone to 25 mg twice a day. A basic metabolic panel will be done in one week.Marland Kitchen 2-D Doppler echocardiogram will be done in October to see if this been improvement in LV function. We have not been able to achieve optimal medical therapy because of inability to tolerate ace therapy, which caused hypotension at very low doses. She can also not tolerate loop diuretic therapy.  Labs/ tests ordered today include:   Orders Placed This Encounter  Procedures  . Basic metabolic panel  . Echocardiogram     Disposition:   FU with HS in 6 weeks  Signed, Lesleigh Noe, MD  02/07/2015 12:02 PM    [facility] [address] [place], [place], [place]  [address] Phone: [phone]; Fax: [phone]

## 2015-02-07 ENCOUNTER — Ambulatory Visit (INDEPENDENT_AMBULATORY_CARE_PROVIDER_SITE_OTHER): Payer: No Typology Code available for payment source | Admitting: Interventional Cardiology

## 2015-02-07 ENCOUNTER — Encounter: Payer: Self-pay | Admitting: Interventional Cardiology

## 2015-02-07 VITALS — BP 138/98 | HR 104 | Ht 67.0 in | Wt 222.8 lb

## 2015-02-07 DIAGNOSIS — I10 Essential (primary) hypertension: Secondary | ICD-10-CM | POA: Diagnosis not present

## 2015-02-07 DIAGNOSIS — I5041 Acute combined systolic (congestive) and diastolic (congestive) heart failure: Secondary | ICD-10-CM | POA: Diagnosis not present

## 2015-02-07 DIAGNOSIS — F418 Other specified anxiety disorders: Secondary | ICD-10-CM

## 2015-02-07 DIAGNOSIS — I429 Cardiomyopathy, unspecified: Secondary | ICD-10-CM

## 2015-02-07 DIAGNOSIS — I428 Other cardiomyopathies: Secondary | ICD-10-CM

## 2015-02-07 MED ORDER — SPIRONOLACTONE 25 MG PO TABS: 25.0000 mg | ORAL_TABLET | Freq: Two times a day (BID) | ORAL | Status: DC

## 2015-02-07 NOTE — Patient Instructions (Signed)
Medication Instructions:  Your physician has recommended you make the following change in your medication:  INCREASE Spironolactone to 25mg  twice daily  Labwork: Your physician recommends that you return for lab work in: 1 week (Bmet)   Testing/Procedures: Your physician has requested that you have an echocardiogram. Echocardiography is a painless test that uses sound waves to create images of your heart. It provides your doctor with information about the size and shape of your heart and how well your heart's chambers and valves are working. This procedure takes approximately one hour. There are no restrictions for this procedure. (To be scheduled after 03/04/15)  Follow-Up: You have a follow up appointments scheduled on 03/30/15 @ 10:45am  Any Other Special Instructions Will Be Listed Below (If Applicable).

## 2015-02-15 ENCOUNTER — Other Ambulatory Visit: Payer: No Typology Code available for payment source

## 2015-02-21 ENCOUNTER — Other Ambulatory Visit: Payer: No Typology Code available for payment source

## 2015-02-21 ENCOUNTER — Ambulatory Visit: Payer: No Typology Code available for payment source | Admitting: Interventional Cardiology

## 2015-02-27 ENCOUNTER — Other Ambulatory Visit (INDEPENDENT_AMBULATORY_CARE_PROVIDER_SITE_OTHER): Payer: No Typology Code available for payment source | Admitting: *Deleted

## 2015-02-27 ENCOUNTER — Telehealth: Payer: Self-pay | Admitting: Interventional Cardiology

## 2015-02-27 DIAGNOSIS — I5041 Acute combined systolic (congestive) and diastolic (congestive) heart failure: Secondary | ICD-10-CM | POA: Diagnosis not present

## 2015-02-27 DIAGNOSIS — I10 Essential (primary) hypertension: Secondary | ICD-10-CM

## 2015-02-27 LAB — BASIC METABOLIC PANEL
BUN: 14 mg/dL (ref 6–23)
CO2: 26 meq/L (ref 19–32)
Calcium: 8.9 mg/dL (ref 8.4–10.5)
Chloride: 104 mEq/L (ref 96–112)
Creatinine, Ser: 1.06 mg/dL (ref 0.40–1.20)
GFR: 58.63 mL/min — AB (ref >60.00)
GLUCOSE: 81 mg/dL (ref 70–99)
POTASSIUM: 4.2 meq/L (ref 3.5–5.1)
Sodium: 139 mEq/L (ref 135–145)

## 2015-02-27 NOTE — Telephone Encounter (Signed)
Walk in pt form-pt has questions about meds-gave to Eula Fried

## 2015-03-01 ENCOUNTER — Telehealth: Payer: Self-pay

## 2015-03-01 NOTE — Telephone Encounter (Signed)
Pt walked to the office on 9/26 my day off. She requested that I call her, she has questions regarding her medications and pharmacy. Called pt x2. Pt phone rings several times and then stops. No option is given to leave a message

## 2015-03-02 NOTE — Telephone Encounter (Signed)
-----   Message from Lyn Records, MD sent at 03/02/2015  1:32 PM EDT ----- Labs are normal. I hope she is on therapy.

## 2015-03-02 NOTE — Telephone Encounter (Signed)
Called pt x2 to give lab results. Pt phone rings out then stops, no voicemail

## 2015-03-08 ENCOUNTER — Ambulatory Visit (HOSPITAL_COMMUNITY): Payer: No Typology Code available for payment source | Attending: Cardiology

## 2015-03-08 ENCOUNTER — Other Ambulatory Visit: Payer: Self-pay

## 2015-03-08 DIAGNOSIS — I340 Nonrheumatic mitral (valve) insufficiency: Secondary | ICD-10-CM | POA: Insufficient documentation

## 2015-03-08 DIAGNOSIS — I059 Rheumatic mitral valve disease, unspecified: Secondary | ICD-10-CM | POA: Insufficient documentation

## 2015-03-08 DIAGNOSIS — I429 Cardiomyopathy, unspecified: Secondary | ICD-10-CM | POA: Diagnosis not present

## 2015-03-08 DIAGNOSIS — I10 Essential (primary) hypertension: Secondary | ICD-10-CM | POA: Insufficient documentation

## 2015-03-08 DIAGNOSIS — I5041 Acute combined systolic (congestive) and diastolic (congestive) heart failure: Secondary | ICD-10-CM | POA: Diagnosis not present

## 2015-03-08 DIAGNOSIS — I428 Other cardiomyopathies: Secondary | ICD-10-CM

## 2015-03-08 DIAGNOSIS — Z6834 Body mass index (BMI) 34.0-34.9, adult: Secondary | ICD-10-CM | POA: Diagnosis not present

## 2015-03-08 DIAGNOSIS — E669 Obesity, unspecified: Secondary | ICD-10-CM | POA: Insufficient documentation

## 2015-03-09 NOTE — Telephone Encounter (Signed)
-----   Message from Lyn Records, MD sent at 03/09/2015  7:16 AM EDT ----- Heart has recovered. Continue medication as current.

## 2015-03-09 NOTE — Telephone Encounter (Signed)
Pt aware of lab and echo results with verbal understanding. Pt confirms that she is taking Carvedilol and Spironolactone as prescribed. Pt will keep f/u appt with Dr.Smith on 10/27

## 2015-03-30 ENCOUNTER — Encounter: Payer: Self-pay | Admitting: Interventional Cardiology

## 2015-03-30 ENCOUNTER — Ambulatory Visit (INDEPENDENT_AMBULATORY_CARE_PROVIDER_SITE_OTHER): Payer: No Typology Code available for payment source | Admitting: Interventional Cardiology

## 2015-03-30 VITALS — BP 136/92 | HR 101 | Ht 67.0 in | Wt 213.8 lb

## 2015-03-30 DIAGNOSIS — I429 Cardiomyopathy, unspecified: Secondary | ICD-10-CM | POA: Diagnosis not present

## 2015-03-30 DIAGNOSIS — I5022 Chronic systolic (congestive) heart failure: Secondary | ICD-10-CM

## 2015-03-30 DIAGNOSIS — I428 Other cardiomyopathies: Secondary | ICD-10-CM

## 2015-03-30 DIAGNOSIS — I10 Essential (primary) hypertension: Secondary | ICD-10-CM

## 2015-03-30 MED ORDER — SPIRONOLACTONE 25 MG PO TABS: 25.0000 mg | ORAL_TABLET | Freq: Two times a day (BID) | ORAL | Status: DC

## 2015-03-30 NOTE — Patient Instructions (Signed)
Your physician recommends that you continue on your current medications as directed. Please refer to the Current Medication list given to you today. Your physician recommends that you return for lab work in: 1 MONTH (BMET)  Your physician wants you to follow-up in: 6 MONTHS WITH DR. [name].  You will receive a reminder letter in the mail two months in advance. If you don't receive a letter, please call our office to schedule the follow-up appointment.

## 2015-03-30 NOTE — Progress Notes (Signed)
Cardiology Office Note   Date:  03/30/2015   ID:  Abigail Sharp, DOB 08-13-1965, MRN [id number]  PCP:  No PCP Per Patient  Cardiologist:  Lesleigh Noe, MD   Chief Complaint  Patient presents with  . Congestive Heart Failure      History of Present Illness: Abigail Sharp is a 49 y.o. female who presents for systolic heart failure, hypertension, anxiety disorder, and metabolic syndrome.  Since medication adjustment, episodes of orthostatic dizziness have completely resolved. The current medical regimen includes spironolactone and carvedilol therapy. She was unable tolerate ARB/Ace therapy. Recent echocardiography demonstrated LVEF to now be 50%. She denies orthopnea and PND.    Past Medical History  Diagnosis Date  . NICM (nonischemic cardiomyopathy) (HCC) 11/2014    EF is 25%  . Chronic systolic heart failure (HCC)   . HTN (hypertension)   . Microcytic anemia   . Pre-diabetes     Past Surgical History  Procedure Laterality Date  . Tonsillectomy    . Tubal ligation    . Cardiac catheterization N/A 11/07/2014    Procedure: Right/Left Heart Cath and Coronary Angiography;  Surgeon: Runell Gess, MD;  Location: [facility] INVASIVE CV LAB;  Service: Cardiovascular;  Laterality: N/A;     Current Outpatient Prescriptions  Medication Sig Dispense Refill  . aspirin EC 81 MG EC tablet Take 1 tablet (81 mg total) by mouth daily. 30 tablet 0  . carvedilol (COREG) 6.25 MG tablet Take 1 tablet (6.25 mg total) by mouth 2 (two) times daily with a meal. 60 tablet 11  . fluticasone (FLONASE) 50 MCG/ACT nasal spray Place 2 sprays into both nostrils daily.    Marland Kitchen spironolactone (ALDACTONE) 25 MG tablet Take 1 tablet (25 mg total) by mouth 2 (two) times daily. 180 tablet 3   No current facility-administered medications for this visit.    Allergies:   Sulfa antibiotics    Social History:  The patient  reports that she has never smoked. She has never used smokeless tobacco. She reports  that she does not drink alcohol or use illicit drugs.   Family History:  The patient's family history includes Diabetes in her father, mother, and sister; Heart disease in her father; Hypertension in her father and mother.    ROS:  Please see the history of present illness.   Otherwise, review of systems are positive for rare orthostatic dizziness. Depression, vision disturbance, anxiety, and rare shortness of breath. She has not yet had her medications today..   All other systems are reviewed and negative.    PHYSICAL EXAM: VS:  BP 136/92 mmHg  Pulse 101  Ht  (1.702 m)  Wt 96.979 kg (213 lb 12.8 oz)  BMI 33.48 kg/m2  SpO2 99% , BMI Body mass index is 33.48 kg/(m^2). GEN: Well nourished, well developed, in no acute distress HEENT: normal Neck: no JVD, carotid bruits, or masses Cardiac: RRR.  There is no murmur, rub, or gallop. There is no edema. Respiratory:  clear to auscultation bilaterally, normal work of breathing. GI: soft, nontender, nondistended, + BS MS: no deformity or atrophy Skin: warm and dry, no rash Neuro:  Strength and sensation are intact Psych: euthymic mood, full affect   EKG:  EKG is not ordered today.   Recent Labs: 11/03/2014: B Natriuretic Peptide 1168.3* 11/04/2014: TSH 1.192 11/06/2014: Magnesium 1.9 11/17/2014: ALT 20 12/06/2014: Hemoglobin 13.9; Platelets 241.0; Pro B Natriuretic peptide (BNP) 16.0 02/27/2015: BUN 14; Creatinine, Ser 1.06; Potassium 4.2; Sodium 139  Lipid Panel    Component Value Date/Time   CHOL 121 11/05/2014 0347   TRIG 81 11/05/2014 0347   HDL 26* 11/05/2014 0347   CHOLHDL 4.7 11/05/2014 0347   VLDL 16 11/05/2014 0347   LDLCALC 79 11/05/2014 0347      Wt Readings from Last 3 Encounters:  03/30/15 96.979 kg (213 lb 12.8 oz)  02/07/15 101.061 kg (222 lb 12.8 oz)  01/02/15 99.791 kg (220 lb)      Other studies Reviewed: Additional studies/ records that were reviewed today include: Recent echo report and laboratory  data.. The findings include potassium was normal about one month ago. LVEF is now 50%.. The most recent laboratory data was done in late September. Both renal function and potassium were normal. This was done with the patient taking spironolactone 25 mg twice a day    ASSESSMENT AND PLAN:  1. Chronic systolic heart failure (HCC) Recovery of LV systolic function from EF 25% to EF 50% October 2016  2. NICM (nonischemic cardiomyopathy) (HCC) Improvement in LV function is noted  3. Essential hypertension Elevated diastolic pressure this morning is related to absence of medication. She will take her medication when she returns all.  4. Morbid obesity, unspecified obesity type (HCC) Unchanged.    Current medicines are reviewed at length with the patient today.  The patient has the following concerns regarding medicines: She is taking her lactone 25 mg twice a day..  The following changes/actions have been instituted:    Continue the current medical regimen  Call of dyspnea  Basic metabolic one month and further testing pending results.  Labs/ tests ordered today include:   Orders Placed This Encounter  Procedures  . Basic Metabolic Panel (BMET)     Disposition:   FU with HS in 6 months  Signed, Lesleigh Noe, MD  03/30/2015 11:14 AM    [facility] [address] [place], [place], [place]  [address] Phone: [phone]; Fax: [phone]

## 2015-04-29 ENCOUNTER — Other Ambulatory Visit: Payer: Self-pay | Admitting: Internal Medicine

## 2015-08-09 ENCOUNTER — Ambulatory Visit: Payer: No Typology Code available for payment source | Admitting: Nurse Practitioner

## 2015-08-09 ENCOUNTER — Telehealth: Payer: Self-pay | Admitting: Interventional Cardiology

## 2015-08-09 NOTE — Telephone Encounter (Signed)
Returned pt call. Pt sts that she has had the intermittent pain the middle of her chest for the last 4-5 weeks. Pt sts that it occurs at rest, it is a 3-4 out of 10. Pt sts that the pain does not radiate. She denies swelling, she does get sob with activity.pt also describes a pressure/pain in her hips, that she associates with the chest discomfort. She has not had this discomfort today. She was scheduled to see Sunday Spillers today. She had to cancel her appt because of transportation issues. Offered pt an appt with an APP tomorrow. She declined. Pt rqst to see Dr.Smith. appt scheduled with Dr.Smith for 3/16 @ 4pm. Pt voiced appreciation. She feel that it would be ok for her to wait until next week to be seen. Adv pt to call the office if symptoms increase or worsen. Pt verbalized understanding

## 2015-08-09 NOTE — Telephone Encounter (Signed)
Per pt call needs to be seen soon.  Pt is having pain around the middle of her breast bone it takes her breath away for about a month now.   Patient stated it radiates through her torso.

## 2015-08-17 ENCOUNTER — Ambulatory Visit (INDEPENDENT_AMBULATORY_CARE_PROVIDER_SITE_OTHER): Payer: BLUE CROSS/BLUE SHIELD | Admitting: Interventional Cardiology

## 2015-08-17 ENCOUNTER — Encounter: Payer: Self-pay | Admitting: Interventional Cardiology

## 2015-08-17 VITALS — BP 124/86 | HR 81 | Ht 67.0 in | Wt 197.6 lb

## 2015-08-17 DIAGNOSIS — I5022 Chronic systolic (congestive) heart failure: Secondary | ICD-10-CM | POA: Diagnosis not present

## 2015-08-17 DIAGNOSIS — I10 Essential (primary) hypertension: Secondary | ICD-10-CM | POA: Diagnosis not present

## 2015-08-17 NOTE — Progress Notes (Signed)
Cardiology Office Note   Date:  08/17/2015   ID:  [name], DOB [birth year], MRN [id number]  PCP:  No PCP Per Patient  Cardiologist:  Lesleigh Noe, MD   Chief Complaint  Patient presents with  . Congestive Heart Failure      History of Present Illness: Abigail Sharp is a 50 y.o. female who presents for Systolic heart failure, hypertension, prediabetes, LVEF 25%.   Multiple somatic complaints. History of coronary angioma with normal coronaries. Today having chest discomfort and other pains started her nose and go away down through her chest. Etiology is uncertain.    Past Medical History  Diagnosis Date  . NICM (nonischemic cardiomyopathy) (HCC) 11/2014    EF is 25%  . Chronic systolic heart failure (HCC)   . HTN (hypertension)   . Microcytic anemia   . Pre-diabetes     Past Surgical History  Procedure Laterality Date  . Tonsillectomy    . Tubal ligation    . Cardiac catheterization N/A 11/07/2014    Procedure: Right/Left Heart Cath and Coronary Angiography;  Surgeon: Runell Gess, MD;  Location: [facility] INVASIVE CV LAB;  Service: Cardiovascular;  Laterality: N/A;     Current Outpatient Prescriptions  Medication Sig Dispense Refill  . aspirin EC 81 MG EC tablet Take 1 tablet (81 mg total) by mouth daily. 30 tablet 0  . carvedilol (COREG) 6.25 MG tablet Take 1 tablet (6.25 mg total) by mouth 2 (two) times daily with a meal. 60 tablet 11  . fluticasone (FLONASE) 50 MCG/ACT nasal spray PLACE 2 SPRAYS INTO EACH NOSTRIL DAILY 16 g 2  . spironolactone (ALDACTONE) 25 MG tablet Take 1 tablet (25 mg total) by mouth 2 (two) times daily. 180 tablet 3   No current facility-administered medications for this visit.    Allergies:   Sulfa antibiotics    Social History:  The patient  reports that she has never smoked. She has never used smokeless tobacco. She reports that she does not drink alcohol or use illicit drugs.   Family History:  The patient's family history  includes Diabetes in her father, mother, and sister; Heart disease in her father; Hypertension in her father and mother.    ROS:  Please see the history of present illness.   Otherwise, review of systems are positive for tearful with multiple complaints including aching, nightmares, inability to sleep, and searching for disabilities..   All other systems are reviewed and negative.    PHYSICAL EXAM: VS:  BP 124/86 mmHg  Pulse 81  Ht 5\' 7"  (1.702 m)  Wt 197 lb 9.6 oz (89.631 kg)  BMI 30.94 kg/m2 , BMI Body mass index is 30.94 kg/(m^2). GEN: Well nourished, well developed, in no acute distress HEENT: normal Neck: no JVD, carotid bruits, or masses Cardiac: RRR.  There is no murmur, rub, or gallop. There is no edema. Respiratory:  clear to auscultation bilaterally, normal work of breathing. GI: soft, nontender, nondistended, + BS MS: no deformity or atrophy Skin: warm and dry, no rash Neuro:  Strength and sensation are intact Psych: euthymic mood, full affect   EKG:  EKG is not ordered today.    Recent Labs: 11/03/2014: B Natriuretic Peptide 1168.3* 11/04/2014: TSH 1.192 11/06/2014: Magnesium 1.9 11/17/2014: ALT 20 12/06/2014: Hemoglobin 13.9; Platelets 241.0; Pro B Natriuretic peptide (BNP) 16.0 02/27/2015: BUN 14; Creatinine, Ser 1.06; Potassium 4.2; Sodium 139    Lipid Panel    Component Value Date/Time   CHOL 121 11/05/2014 0347  TRIG 81 11/05/2014 0347   HDL 26* 11/05/2014 0347   CHOLHDL 4.7 11/05/2014 0347   VLDL 16 11/05/2014 0347   LDLCALC 79 11/05/2014 0347      Wt Readings from Last 3 Encounters:  08/17/15 197 lb 9.6 oz (89.631 kg)  03/30/15 213 lb 12.8 oz (96.979 kg)  02/07/15 222 lb 12.8 oz (101.061 kg)      Other studies Reviewed: Additional studies/ records that were reviewed today include: none. The findings include none.    ASSESSMENT AND PLAN:  1. Chronic systolic heart failure (HCC) Blood pressure is adequate on current heart feed therapy which is  beta blocker and spironolactone.  2. Essential hypertension Excellent blood pressure  3. Morbid obesity, unspecified obesity type (HCC) Unchanged  4. Multiple somatic complaints  Current medicines are reviewed at length with the patient today.  The patient has the following concerns regarding medicines: None.  The following changes/actions have been instituted:    Low salt diet  Aerobic activity  Follow-up with primary care because of multiple somatic complaints  Labs/ tests ordered today include:  No orders of the defined types were placed in this encounter.     Disposition:   FU with HS in 4 months  Signed, Lesleigh Noe, MD  08/17/2015 4:54 PM    [facility] [address] [place], [place], [place]  [address] Phone: [phone]; Fax: [phone]

## 2015-08-17 NOTE — Patient Instructions (Signed)
Medication Instructions:  Your physician recommends that you continue on your current medications as directed. Please refer to the Current Medication list given to you today.   Labwork: None ordered.  Testing/Procedures: None ordered  Follow-Up: Your physician wants you to follow-up in 6 months. You will receive a reminder letter in the mail two months in advance. If you don't receive a letter, please call our office to schedule the follow-up appointment.   Any Other Special Instructions Will Be Listed Below (If Applicable).     If you need a refill on your cardiac medications before your next appointment, please call your pharmacy.   

## 2015-08-23 ENCOUNTER — Ambulatory Visit: Payer: BLUE CROSS/BLUE SHIELD | Attending: Internal Medicine | Admitting: Internal Medicine

## 2015-08-23 ENCOUNTER — Other Ambulatory Visit: Payer: Self-pay | Admitting: Internal Medicine

## 2015-08-23 ENCOUNTER — Telehealth: Payer: Self-pay | Admitting: Internal Medicine

## 2015-08-23 ENCOUNTER — Encounter: Payer: Self-pay | Admitting: Internal Medicine

## 2015-08-23 VITALS — BP 132/84 | HR 77 | Temp 98.6°F | Resp 15 | Ht 67.0 in | Wt 201.4 lb

## 2015-08-23 DIAGNOSIS — I428 Other cardiomyopathies: Secondary | ICD-10-CM

## 2015-08-23 DIAGNOSIS — H6121 Impacted cerumen, right ear: Secondary | ICD-10-CM

## 2015-08-23 DIAGNOSIS — Z882 Allergy status to sulfonamides status: Secondary | ICD-10-CM | POA: Insufficient documentation

## 2015-08-23 DIAGNOSIS — Z833 Family history of diabetes mellitus: Secondary | ICD-10-CM | POA: Insufficient documentation

## 2015-08-23 DIAGNOSIS — I429 Cardiomyopathy, unspecified: Secondary | ICD-10-CM

## 2015-08-23 DIAGNOSIS — Z8249 Family history of ischemic heart disease and other diseases of the circulatory system: Secondary | ICD-10-CM | POA: Insufficient documentation

## 2015-08-23 DIAGNOSIS — Z7982 Long term (current) use of aspirin: Secondary | ICD-10-CM | POA: Insufficient documentation

## 2015-08-23 DIAGNOSIS — I10 Essential (primary) hypertension: Secondary | ICD-10-CM

## 2015-08-23 DIAGNOSIS — Z1231 Encounter for screening mammogram for malignant neoplasm of breast: Secondary | ICD-10-CM

## 2015-08-23 DIAGNOSIS — I110 Hypertensive heart disease with heart failure: Secondary | ICD-10-CM | POA: Diagnosis not present

## 2015-08-23 DIAGNOSIS — I5022 Chronic systolic (congestive) heart failure: Secondary | ICD-10-CM | POA: Insufficient documentation

## 2015-08-23 DIAGNOSIS — Z79899 Other long term (current) drug therapy: Secondary | ICD-10-CM | POA: Diagnosis not present

## 2015-08-23 DIAGNOSIS — Z0000 Encounter for general adult medical examination without abnormal findings: Secondary | ICD-10-CM

## 2015-08-23 DIAGNOSIS — D509 Iron deficiency anemia, unspecified: Secondary | ICD-10-CM | POA: Insufficient documentation

## 2015-08-23 DIAGNOSIS — J302 Other seasonal allergic rhinitis: Secondary | ICD-10-CM | POA: Diagnosis not present

## 2015-08-23 DIAGNOSIS — N644 Mastodynia: Secondary | ICD-10-CM | POA: Insufficient documentation

## 2015-08-23 DIAGNOSIS — F411 Generalized anxiety disorder: Secondary | ICD-10-CM | POA: Diagnosis not present

## 2015-08-23 DIAGNOSIS — B379 Candidiasis, unspecified: Secondary | ICD-10-CM | POA: Insufficient documentation

## 2015-08-23 DIAGNOSIS — K219 Gastro-esophageal reflux disease without esophagitis: Secondary | ICD-10-CM | POA: Insufficient documentation

## 2015-08-23 DIAGNOSIS — R7303 Prediabetes: Secondary | ICD-10-CM | POA: Diagnosis not present

## 2015-08-23 LAB — CBC WITH DIFFERENTIAL/PLATELET
BASOS ABS: 0 10*3/uL (ref 0.0–0.1)
Basophils Relative: 0 % (ref 0–1)
EOS PCT: 1 % (ref 0–5)
Eosinophils Absolute: 0.1 10*3/uL (ref 0.0–0.7)
HEMATOCRIT: 38.4 % (ref 36.0–46.0)
Hemoglobin: 12.6 g/dL (ref 12.0–15.0)
LYMPHS ABS: 1.7 10*3/uL (ref 0.7–4.0)
LYMPHS PCT: 28 % (ref 12–46)
MCH: 26.4 pg (ref 26.0–34.0)
MCHC: 32.8 g/dL (ref 30.0–36.0)
MCV: 80.5 fL (ref 78.0–100.0)
MONO ABS: 0.5 10*3/uL (ref 0.1–1.0)
MONOS PCT: 9 % (ref 3–12)
MPV: 10.9 fL (ref 8.6–12.4)
Neutro Abs: 3.7 10*3/uL (ref 1.7–7.7)
Neutrophils Relative %: 62 % (ref 43–77)
Platelets: 219 10*3/uL (ref 150–400)
RBC: 4.77 MIL/uL (ref 3.87–5.11)
RDW: 15.1 % (ref 11.5–15.5)
WBC: 6 10*3/uL (ref 4.0–10.5)

## 2015-08-23 LAB — LIPID PANEL
CHOL/HDL RATIO: 5.3 ratio — AB (ref <=5.0)
Cholesterol: 189 mg/dL (ref 125–200)
HDL: 36 mg/dL — AB (ref >=46)
LDL Cholesterol: 95 mg/dL (ref <130)
TRIGLYCERIDES: 290 mg/dL — AB (ref <150)
VLDL: 58 mg/dL — ABNORMAL HIGH (ref <30)

## 2015-08-23 LAB — BASIC METABOLIC PANEL
BUN: 12 mg/dL (ref 7–25)
CHLORIDE: 107 mmol/L (ref 98–110)
CO2: 23 mmol/L (ref 20–31)
Calcium: 9.2 mg/dL (ref 8.6–10.2)
Creat: 1.01 mg/dL (ref 0.50–1.10)
Glucose, Bld: 68 mg/dL (ref 65–99)
Potassium: 3.9 mmol/L (ref 3.5–5.3)
Sodium: 141 mmol/L (ref 135–146)

## 2015-08-23 LAB — TSH: TSH: 3.33 mIU/L

## 2015-08-23 MED ORDER — SUCRALFATE 1 G PO TABS: 1.0000 g | ORAL_TABLET | Freq: Three times a day (TID) | ORAL | Status: DC

## 2015-08-23 MED ORDER — LORATADINE 10 MG PO TABS: 10.0000 mg | ORAL_TABLET | Freq: Every day | ORAL | Status: DC

## 2015-08-23 MED ORDER — CITALOPRAM HYDROBROMIDE 20 MG PO TABS: 20.0000 mg | ORAL_TABLET | Freq: Every day | ORAL | Status: DC

## 2015-08-23 MED ORDER — OMEPRAZOLE 40 MG PO CPDR: 40.0000 mg | DELAYED_RELEASE_CAPSULE | Freq: Every day | ORAL | Status: DC

## 2015-08-23 MED ORDER — NYSTATIN 100000 UNIT/GM EX POWD: 1.0000 g | Freq: Three times a day (TID) | CUTANEOUS | Status: DC

## 2015-08-23 MED ORDER — FLUCONAZOLE 150 MG PO TABS: 150.0000 mg | ORAL_TABLET | Freq: Once | ORAL | Status: DC

## 2015-08-23 NOTE — Patient Instructions (Signed)
- make appt with Jamie/SW - pick up medications - labs today   DASH Eating Plan DASH stands for "Dietary Approaches to Stop Hypertension." The DASH eating plan is a healthy eating plan that has been shown to reduce high blood pressure (hypertension). Additional health benefits may include reducing the risk of type 2 diabetes mellitus, heart disease, and stroke. The DASH eating plan may also help with weight loss. WHAT DO I NEED TO KNOW ABOUT THE DASH EATING PLAN? For the DASH eating plan, you will follow these general guidelines:  Choose foods with a percent daily value for sodium of less than 5% (as listed on the food label).  Use salt-free seasonings or herbs instead of table salt or sea salt.  Check with your health care provider or pharmacist before using salt substitutes.  Eat lower-sodium products, often labeled as "lower sodium" or "no salt added."  Eat fresh foods.  Eat more vegetables, fruits, and low-fat dairy products.  Choose whole grains. Look for the word "whole" as the first word in the ingredient list.  Choose fish and skinless chicken or [place] more often than red meat. Limit fish, poultry, and meat to 6 oz (170 g) each day.  Limit sweets, desserts, sugars, and sugary drinks.  Choose heart-healthy fats.  Limit cheese to 1 oz (28 g) per day.  Eat more home-cooked food and less restaurant, buffet, and fast food.  Limit fried foods.  Cook foods using methods other than frying.  Limit canned vegetables. If you do use them, rinse them well to decrease the sodium.  When eating at a restaurant, ask that your food be prepared with less salt, or no salt if possible. WHAT FOODS CAN I EAT? Seek help from a dietitian for individual calorie needs. Grains Whole grain or whole wheat bread. Brown rice. Whole grain or whole wheat pasta. Quinoa, bulgur, and whole grain cereals. Low-sodium cereals. Corn or whole wheat flour tortillas. Whole grain cornbread. Whole grain  crackers. Low-sodium crackers. Vegetables Fresh or frozen vegetables (raw, steamed, roasted, or grilled). Low-sodium or reduced-sodium tomato and vegetable juices. Low-sodium or reduced-sodium tomato sauce and paste. Low-sodium or reduced-sodium canned vegetables.  Fruits All fresh, canned (in natural juice), or frozen fruits. Meat and Other Protein Products Ground beef (85% or leaner), grass-fed beef, or beef trimmed of fat. Skinless chicken or [place]. Ground chicken or [place]. Pork trimmed of fat. All fish and seafood. Eggs. Dried beans, peas, or lentils. Unsalted nuts and seeds. Unsalted canned beans. Dairy Low-fat dairy products, such as skim or 1% milk, 2% or reduced-fat cheeses, low-fat ricotta or cottage cheese, or plain low-fat yogurt. Low-sodium or reduced-sodium cheeses. Fats and Oils Tub margarines without trans fats. Light or reduced-fat mayonnaise and salad dressings (reduced sodium). Avocado. Safflower, olive, or canola oils. Natural peanut or almond butter. Other Unsalted popcorn and pretzels. The items listed above may not be a complete list of recommended foods or beverages. Contact your dietitian for more options. WHAT FOODS ARE NOT RECOMMENDED? Grains White bread. White pasta. White rice. Refined cornbread. Bagels and croissants. Crackers that contain trans fat. Vegetables Creamed or fried vegetables. Vegetables in a cheese sauce. Regular canned vegetables. Regular canned tomato sauce and paste. Regular tomato and vegetable juices. Fruits Dried fruits. Canned fruit in light or heavy syrup. Fruit juice. Meat and Other Protein Products Fatty cuts of meat. Ribs, chicken wings, bacon, sausage, bologna, salami, chitterlings, fatback, hot dogs, bratwurst, and packaged luncheon meats. Salted nuts and seeds. Canned beans with salt. Dairy  Whole or 2% milk, cream, half-and-half, and cream cheese. Whole-fat or sweetened yogurt. Full-fat cheeses or blue cheese. Nondairy creamers and  whipped toppings. Processed cheese, cheese spreads, or cheese curds. Condiments Onion and garlic salt, seasoned salt, table salt, and sea salt. Canned and packaged gravies. Worcestershire sauce. Tartar sauce. Barbecue sauce. Teriyaki sauce. Soy sauce, including reduced sodium. Steak sauce. Fish sauce. Oyster sauce. Cocktail sauce. Horseradish. Ketchup and mustard. Meat flavorings and tenderizers. Bouillon cubes. Hot sauce. Tabasco sauce. Marinades. Taco seasonings. Relishes. Fats and Oils Butter, stick margarine, lard, shortening, ghee, and bacon fat. Coconut, palm kernel, or palm oils. Regular salad dressings. Other Pickles and olives. Salted popcorn and pretzels. The items listed above may not be a complete list of foods and beverages to avoid. Contact your dietitian for more information. WHERE CAN I FIND MORE INFORMATION? National Heart, Lung, and Blood Institute: [employer]   This information is not intended to replace advice given to you by your health care provider. Make sure you discuss any questions you have with your health care provider.   Document Released: 05/09/2011 Document Revised: 06/10/2014 Document Reviewed: 03/24/2013 Elsevier Interactive Patient Education [employer]

## 2015-08-23 NOTE — Progress Notes (Addendum)
Abigail Sharp, is a 50 y.o. female  EHU:[id number]  ZCH:[id number]  DOB - [birth year]  CC:  Chief Complaint  Patient presents with  . re-establish care       HPI: Abigail Sharp is a 50 y.o. female here today to establish medical care.  She is being followed by Cardiology for nonsichemic cardiomyopathy, erf 25%.   She was last seen in our clinic 6/16.  She has numeorus complaints today.  Feels anxeous/sad, but denies SI/HI/AH/VH.  She got in an argument w/ her neighbor recently.  She doesn't like to interact with many people, and keeps to herself at home.  Does co of being tearful at times, and lack of concentration.  Also co of meg pain, severe, sometimes takes her breath away, radiates to below ribs.  Does not notice if any thing makes it worse or better.  Doesn't recall association with food.  C/o of extreme puritis to her bilateral axilla as well, as under bilateral breast.  She is due for a mammogram. She also c/o of nasal congestion constantly, but doesn't know how to use her flonase appropriately.  +weight gain, cold intolerance.  Patient has No headache, No chest pain, , No Nausea, No new weakness tingling or numbness, No Cough - SOB.  Allergies  Allergen Reactions  . Sulfa Antibiotics Other (See Comments)    unknown   Past Medical History  Diagnosis Date  . NICM (nonischemic cardiomyopathy) (HCC) 11/2014    EF is 25%  . Chronic systolic heart failure (HCC)   . HTN (hypertension)   . Microcytic anemia   . Pre-diabetes    Current Outpatient Prescriptions on File Prior to Visit  Medication Sig Dispense Refill  . aspirin EC 81 MG EC tablet Take 1 tablet (81 mg total) by mouth daily. 30 tablet 0  . carvedilol (COREG) 6.25 MG tablet Take 1 tablet (6.25 mg total) by mouth 2 (two) times daily with a meal. 60 tablet 11  . fluticasone (FLONASE) 50 MCG/ACT nasal spray PLACE 2 SPRAYS INTO EACH NOSTRIL DAILY 16 g 2  . spironolactone (ALDACTONE) 25 MG tablet Take 1 tablet (25 mg total)  by mouth 2 (two) times daily. 180 tablet 3   No current facility-administered medications on file prior to visit.   Family History  Problem Relation Age of Onset  . Diabetes Mother   . Hypertension Mother   . Diabetes Father   . Heart disease Father   . Hypertension Father   . Diabetes Sister    Social History   Social History  . Marital Status: Single    Spouse Name: N/A  . Number of Children: N/A  . Years of Education: N/A   Occupational History  . Not on file.   Social History Main Topics  . Smoking status: Never Smoker   . Smokeless tobacco: Never Used  . Alcohol Use: No  . Drug Use: No  . Sexual Activity: Yes   Other Topics Concern  . Not on file   Social History Narrative    Review of Systems: Per HPI, otherwise all systems reviewed and negative.  Objective:   Filed Vitals:   08/23/15 0948  BP: 132/84  Pulse: 77  Temp: 98.6 F (37 C)  Resp: 15    Physical Exam: Constitutional: Patient appears well-developed and well-nourished. No distress.  Mild anxiety, pleasant, obese, aaox 3 HENT: Normocephalic, atraumatic, External right and left ear normal. Oropharynx is clear and moist, no erythema, +cobblestoning noted.  +cerumin empaction right  ear, TM on left clear. Boggy nares bilaterally.  No frontal /max sinus ttp. Eyes: Conjunctivae and EOM are normal. PERRL, no scleral icterus. Neck: Normal ROM. Neck supple. No JVD. No tracheal deviation. No thyromegaly. CVS: RRR, S1/S2 +, no murmurs, no gallops, no carotid bruit.  Pulmonary: Effort and breath sounds normal, no stridor, rhonchi, wheezes, rales.  Abdominal: Soft. BS +, no distension, tenderness, rebound or guarding.  Musculoskeletal: Normal range of motion. No edema and no tenderness.  Lymphadenopathy: No lymphadenopathy noted, cervical, inguinal or axillary Neuro: Alert. Normal reflexes, muscle tone coordination. No cranial nerve deficit grossly. Skin: Skin is warm and dry. No rash noted. Not  diaphoretic. No erythema. No pallor.  +dry, ecxema-like rash bilat axilla, no candida infection noted under bilat breast. Psychiatric:  Behavior, judgment, thought content normal.  Anxious, but pleasant.  Lab Results  Component Value Date   WBC 10.5 12/06/2014   HGB 13.9 12/06/2014   HCT 44.6 12/06/2014   MCV 70.4* 12/06/2014   PLT 241.0 12/06/2014   Lab Results  Component Value Date   CREATININE 1.06 02/27/2015   BUN 14 02/27/2015   NA 139 02/27/2015   K 4.2 02/27/2015   CL 104 02/27/2015   CO2 26 02/27/2015    Lab Results  Component Value Date   HGBA1C 5.7* 11/05/2014   Lipid Panel     Component Value Date/Time   CHOL 121 11/05/2014 0347   TRIG 81 11/05/2014 0347   HDL 26* 11/05/2014 0347   CHOLHDL 4.7 11/05/2014 0347   VLDL 16 11/05/2014 0347   LDLCALC 79 11/05/2014 0347       Assessment and plan:   1. Essential hypertension Controlled, continue bp meds, per cards. - CBC with Differential - Basic Metabolic Panel   2. Non-ischemic cardiomyopathy (HCC), EF 25% - same bp regimen, encouraged to take her meds prior to appt. - Lipid Panel  3. Microcytic anemia, hx of, - will rchk cbc  4. Cerumen impaction, right - Ear Lavage today  5. Candida infection, bilateral axilla - trial diflucan/nystatin  6. Generalized anxiety disorder - appt w/ Valarie, sw - trial citalopram  7. Other seasonal allergic rhinitis - continue flonase, instructed proper use of it, trial claritin today as well  8. Gastroesophageal reflux disease without esophagitis - trial ppi and carafate x 2 wks, will reeval.  9. Healthcare maintenance chk - TSH - Vitamin D, 25-hydroxy  10. Screening for breast cancer - MM Digital Screening; Future - ordered   Return in about 2 months (around 10/23/2015).  The patient was given clear instructions to go to ER or return to medical center if symptoms don't improve, worsen or new problems develop. The patient verbalized understanding. The  patient was told to call to get lab results if they haven't heard anything in the next week.      Pete Glatter, MD, MBA/MHA [facility] [place], [place] [phone]   08/23/2015, 10:17 AM    // 3/29 and 08/31/15, in discussion with Breast Center, pt is complaining of bilateral breast pain as well.  Rather than screening mammogram, changed to diagnostic mammogram, TOMO, and bilateral breast US to r/o cancer.

## 2015-08-23 NOTE — Telephone Encounter (Signed)
Patient came in stating that she needs a breast diagnostic instead of screening due having pain non side of breast. Patient stated the she forgot to disclose that she was experiencing pain on the side of her breast during office visit on 08/23/15. Please follow up.

## 2015-08-23 NOTE — Progress Notes (Signed)
Patient here to re-establish care She saw cardiology Thursday She did not take her medications this morning because she had not eaten yet Reports upper abdominal "stabbing" pain for last several months

## 2015-08-24 ENCOUNTER — Other Ambulatory Visit: Payer: Self-pay | Admitting: Internal Medicine

## 2015-08-24 LAB — VITAMIN D 25 HYDROXY (VIT D DEFICIENCY, FRACTURES): VIT D 25 HYDROXY: 8 ng/mL — AB (ref 30–100)

## 2015-08-24 MED ORDER — SIMVASTATIN 20 MG PO TABS: 20.0000 mg | ORAL_TABLET | Freq: Every day | ORAL | Status: DC

## 2015-08-24 MED ORDER — VITAMIN D (ERGOCALCIFEROL) 1.25 MG (50000 UNIT) PO CAPS: 50000.0000 [IU] | ORAL_CAPSULE | ORAL | Status: DC

## 2015-08-25 ENCOUNTER — Ambulatory Visit: Payer: BLUE CROSS/BLUE SHIELD | Admitting: Clinical

## 2015-08-28 ENCOUNTER — Telehealth: Payer: Self-pay | Admitting: *Deleted

## 2015-08-28 ENCOUNTER — Ambulatory Visit: Payer: BLUE CROSS/BLUE SHIELD | Attending: Internal Medicine | Admitting: Clinical

## 2015-08-28 DIAGNOSIS — F429 Obsessive-compulsive disorder, unspecified: Secondary | ICD-10-CM | POA: Diagnosis not present

## 2015-08-28 NOTE — Telephone Encounter (Signed)
Patient needs screening mammogram first and is aware.

## 2015-08-28 NOTE — Telephone Encounter (Signed)
-----   Message from Pete Glatter, MD sent at 08/24/2015  5:48 PM EDT ----- Please call patient and tell her the labs results.  Her Vit D level was extremely low, so I have prescribed for her Vit D prescription.  Also, her Triglyceride cholesterol was very high.  She needs to watch what she eats, limit the saturated fats to <20% in general per day.  Also her HDL (the good cholesterol) is low.  By watching what she eats, and increasing her activity more, this will improve.  I have prescribed for her simvastatin 20 mg to take for her cholesterol. We will chk these levels in 3-4 months after she is taking them to make sure they improve.  Her other labs, including her thyroid and blood counts are all normal, no signs of anemia.

## 2015-08-28 NOTE — Telephone Encounter (Signed)
Patient in clinic to meet with social worker.  RN gave results and instructions.

## 2015-08-28 NOTE — Telephone Encounter (Signed)
Left message for patient to return my call.

## 2015-08-28 NOTE — Telephone Encounter (Signed)
-----   Message from Dawn T Langeland, MD sent at 08/24/2015  5:48 PM EDT ----- Please call patient and tell her the labs results.  Her Vit D level was extremely low, so I have prescribed for her Vit D prescription.  Also, her Triglyceride cholesterol was very high.  She needs to watch what she eats, limit the saturated fats to <20% in general per day.  Also her HDL (the good cholesterol) is low.  By watching what she eats, and increasing her activity more, this will improve.  I have prescribed for her simvastatin 20 mg to take for her cholesterol. We will chk these levels in 3-4 months after she is taking them to make sure they improve.  Her other labs, including her thyroid and blood counts are all normal, no signs of anemia. 

## 2015-08-28 NOTE — Progress Notes (Signed)
ASSESSMENT: Pt experiencing Obsessive compulsive disorder. Pt needs to f/u with PCP and [facility]; would benefit from psychoeducation, brief therapeutic interventions regarding coping with symptoms of OCD, and may benefit from supportive group therapy.  Stage of Change: precontemplative  PLAN: 1. F/U with behavioral health consultant in two weeks 2. Psychiatric Medications: Celexa 3. Behavioral recommendation(s):   -Daily relaxation breathing exercises, as practiced in office -Consider "worry hour" to separate stressors you do and do not have control  -Consider setting a specific number for repetitive hand-washing (example, allow 3 times rather than 5 or more) -Consider group therapy at [facility] -Consider reading educational materials regarding coping with symtpoms of anxiety and depression SUBJECTIVE: Pt. referred by Dr Julien Nordmann for symptoms of anxiety and depression:  Pt. reports the following symptoms/concerns: Pt states that her primary concern is overwhelming anxiety, constant worry, feeling depressed, finding it difficult to leave the house or get anything accomplished, washing her hands too many times until they are red, stress over having food stamps taken away; found couponing to be relaxing and gave her some control over finances. Lives with her adult son, who is her main support, no friends, feuding with neighbors, says she might like group therapy. Duration of problem: At least 8 months Severity: severe  OBJECTIVE: Orientation & Cognition: Oriented x3. Thought processes normal and appropriate to situation. Mood: teary. Affect: appropriate Appearance: appropriate Risk of harm to self or others:  No known risk of harm to self or others, no SI, no HI Substance use: none Assessments administered: PHQ9: 17/ GAD7: 16  Diagnosis: Obsessive compulsive disorder CPT Code: F42.9 -------------------------------------------- Other(s) present in the room: none  Time  spent with patient in exam room: 60 minutes, 10:15-11:15am   Depression screen [facility] 2/9 08/28/2015  Decreased Interest 3  Down, Depressed, Hopeless 3  PHQ - 2 Score 6  Altered sleeping 2  Tired, decreased energy 1  Change in appetite 3  Feeling bad or failure about yourself  3  Trouble concentrating 1  Moving slowly or fidgety/restless 1  Suicidal thoughts 0  PHQ-9 Score 17   GAD 7 : Generalized Anxiety Score 08/28/2015  Nervous, Anxious, on Edge 3  Control/stop worrying 3  Worry too much - different things 3  Trouble relaxing 1  Restless 0  Easily annoyed or irritable 3  Afraid - awful might happen 3  Total GAD 7 Score 16

## 2015-08-29 ENCOUNTER — Telehealth: Payer: Self-pay | Admitting: Internal Medicine

## 2015-08-29 NOTE — Telephone Encounter (Signed)
Cherish from Breast called needing to speak with nurse. [phone]  Patient needs a diagnostic mammogram and ultrasound for breast. Please follow up.

## 2015-08-30 ENCOUNTER — Telehealth: Payer: Self-pay | Admitting: Interventional Cardiology

## 2015-08-30 ENCOUNTER — Encounter: Payer: Self-pay | Admitting: Internal Medicine

## 2015-08-30 ENCOUNTER — Telehealth: Payer: Self-pay | Admitting: Internal Medicine

## 2015-08-30 NOTE — Addendum Note (Signed)
Addended byDierdre Searles T on: 08/30/2015 12:48 PM   Modules accepted: Orders

## 2015-08-30 NOTE — Telephone Encounter (Signed)
LVM letter at front office ready to be pick up 

## 2015-08-30 NOTE — Telephone Encounter (Signed)
Orders need to be updated for patient ...diagnosis is contradicting.......diagnosis needs to be changed.. ZWCH [id number], diagnosis : L28.2, same for ultrasound...please follow up with [facility]

## 2015-08-30 NOTE — Telephone Encounter (Signed)
New Message  Pt requested for Dr [name] to review her notes from recent OV concerning her meds/DX- was seen recently at PCP and wanted to f/u w/ RN. Please call back and discuss.

## 2015-08-31 NOTE — Addendum Note (Signed)
Addended by: Dierdre Searles T on: 08/31/2015 08:48 AM   Modules accepted: Orders

## 2015-08-31 NOTE — Telephone Encounter (Signed)
Will route to Dr.Smith to review pt recent o/v with her pcp

## 2015-08-31 NOTE — Telephone Encounter (Signed)
Orders need to be updated for patient ...diagnosis is contradicting.......diagnosis needs to be changed.. ITJL [id number], diagnosis : L28.2, same for ultrasound...please follow up with [facility]...  This needs to be enterd as the the new order.   For clarification please contact [facility]

## 2015-08-31 NOTE — Telephone Encounter (Signed)
I reviewed the note. The problems discussed are not within my comfort zone of knowledge.

## 2015-09-01 ENCOUNTER — Ambulatory Visit: Payer: BLUE CROSS/BLUE SHIELD

## 2015-09-04 ENCOUNTER — Other Ambulatory Visit: Payer: Self-pay | Admitting: Internal Medicine

## 2015-09-04 DIAGNOSIS — R234 Changes in skin texture: Secondary | ICD-10-CM

## 2015-09-04 DIAGNOSIS — N644 Mastodynia: Secondary | ICD-10-CM

## 2015-09-04 NOTE — Telephone Encounter (Signed)
CMA called Abigail Sharp, Abigail Sharp verifed name and DOB. Abigail Sharp was given the instruction to go ahead an make an appt with the breast center.  Abigail Sharp was also transferred to the front to schedule an appt with Dr. Julien Nordmann to address her concerns with her side pain.

## 2015-09-04 NOTE — Addendum Note (Signed)
Addended byDierdre Searles T on: 09/04/2015 11:40 AM   Modules accepted: Orders

## 2015-09-04 NOTE — Telephone Encounter (Signed)
Pt of Dr.Smith's response below. Pt verbalized understanding.

## 2015-09-04 NOTE — Telephone Encounter (Signed)
Thank you. I think I have finally fixed it after talking to [place].  Kim to call back pt.

## 2015-09-04 NOTE — Telephone Encounter (Signed)
Please call patient and tell her that [facility] have [facility] and fix the orders.  She should be good to set up appt now.  I apolagize for mix up.

## 2015-09-04 NOTE — Telephone Encounter (Signed)
Pt. Would like to know if she can get an MRI done because her sides are hurting. Please f/u with pt.

## 2015-09-04 NOTE — Addendum Note (Signed)
Addended byDierdre Searles T on: 09/04/2015 11:28 AM   Modules accepted: Orders

## 2015-09-04 NOTE — Telephone Encounter (Signed)
Please tell patient to come in an see me again. She has so many pains, would not know where to start to scan.  She needs to get the breast scans/us though b/c of her concerns for breast pain.

## 2015-09-07 ENCOUNTER — Ambulatory Visit
Admission: RE | Admit: 2015-09-07 | Discharge: 2015-09-07 | Disposition: A | Discharge status: Home / Self Care | Payer: BLUE CROSS/BLUE SHIELD | Source: Ambulatory Visit | Attending: Internal Medicine | Admitting: Internal Medicine

## 2015-09-07 DIAGNOSIS — R234 Changes in skin texture: Secondary | ICD-10-CM

## 2015-09-07 DIAGNOSIS — N644 Mastodynia: Secondary | ICD-10-CM

## 2015-09-13 ENCOUNTER — Ambulatory Visit: Payer: BLUE CROSS/BLUE SHIELD | Attending: Internal Medicine | Admitting: Internal Medicine

## 2015-09-13 ENCOUNTER — Ambulatory Visit (HOSPITAL_BASED_OUTPATIENT_CLINIC_OR_DEPARTMENT_OTHER): Payer: BLUE CROSS/BLUE SHIELD | Admitting: Clinical

## 2015-09-13 ENCOUNTER — Encounter: Payer: Self-pay | Admitting: Internal Medicine

## 2015-09-13 VITALS — BP 136/67 | HR 75 | Temp 99.2°F | Resp 18 | Ht 64.0 in | Wt 203.0 lb

## 2015-09-13 DIAGNOSIS — I429 Cardiomyopathy, unspecified: Secondary | ICD-10-CM

## 2015-09-13 DIAGNOSIS — R3589 Other polyuria: Secondary | ICD-10-CM

## 2015-09-13 DIAGNOSIS — R7303 Prediabetes: Secondary | ICD-10-CM | POA: Diagnosis not present

## 2015-09-13 DIAGNOSIS — F419 Anxiety disorder, unspecified: Secondary | ICD-10-CM | POA: Insufficient documentation

## 2015-09-13 DIAGNOSIS — R1031 Right lower quadrant pain: Secondary | ICD-10-CM | POA: Diagnosis not present

## 2015-09-13 DIAGNOSIS — Z7982 Long term (current) use of aspirin: Secondary | ICD-10-CM | POA: Insufficient documentation

## 2015-09-13 DIAGNOSIS — I255 Ischemic cardiomyopathy: Secondary | ICD-10-CM | POA: Insufficient documentation

## 2015-09-13 DIAGNOSIS — I428 Other cardiomyopathies: Secondary | ICD-10-CM

## 2015-09-13 DIAGNOSIS — Z888 Allergy status to other drugs, medicaments and biological substances status: Secondary | ICD-10-CM | POA: Insufficient documentation

## 2015-09-13 DIAGNOSIS — I10 Essential (primary) hypertension: Secondary | ICD-10-CM | POA: Diagnosis not present

## 2015-09-13 DIAGNOSIS — D509 Iron deficiency anemia, unspecified: Secondary | ICD-10-CM | POA: Diagnosis not present

## 2015-09-13 DIAGNOSIS — K5900 Constipation, unspecified: Secondary | ICD-10-CM | POA: Insufficient documentation

## 2015-09-13 DIAGNOSIS — F411 Generalized anxiety disorder: Secondary | ICD-10-CM | POA: Diagnosis not present

## 2015-09-13 DIAGNOSIS — R358 Other polyuria: Secondary | ICD-10-CM

## 2015-09-13 DIAGNOSIS — F429 Obsessive-compulsive disorder, unspecified: Secondary | ICD-10-CM | POA: Diagnosis not present

## 2015-09-13 DIAGNOSIS — I5022 Chronic systolic (congestive) heart failure: Secondary | ICD-10-CM | POA: Diagnosis not present

## 2015-09-13 DIAGNOSIS — J302 Other seasonal allergic rhinitis: Secondary | ICD-10-CM | POA: Insufficient documentation

## 2015-09-13 DIAGNOSIS — Z79899 Other long term (current) drug therapy: Secondary | ICD-10-CM | POA: Diagnosis not present

## 2015-09-13 LAB — POCT URINALYSIS DIPSTICK
BILIRUBIN UA: NEGATIVE
GLUCOSE UA: NEGATIVE
KETONES UA: NEGATIVE
NITRITE UA: NEGATIVE
RBC UA: NEGATIVE
Spec Grav, UA: 1.025
Urobilinogen, UA: NEGATIVE
pH, UA: 5.5

## 2015-09-13 MED ORDER — SUCRALFATE 1 G PO TABS: 1.0000 g | ORAL_TABLET | Freq: Three times a day (TID) | ORAL | Status: DC

## 2015-09-13 MED ORDER — VITAMIN D (ERGOCALCIFEROL) 1.25 MG (50000 UNIT) PO CAPS: 50000.0000 [IU] | ORAL_CAPSULE | ORAL | Status: DC

## 2015-09-13 MED ORDER — OMEPRAZOLE 40 MG PO CPDR: 40.0000 mg | DELAYED_RELEASE_CAPSULE | Freq: Every day | ORAL | Status: DC

## 2015-09-13 MED ORDER — FLUCONAZOLE 150 MG PO TABS: 150.0000 mg | ORAL_TABLET | Freq: Once | ORAL | Status: DC

## 2015-09-13 MED ORDER — SIMVASTATIN 20 MG PO TABS: 20.0000 mg | ORAL_TABLET | Freq: Every day | ORAL | Status: DC

## 2015-09-13 MED ORDER — LORATADINE 10 MG PO TABS: 10.0000 mg | ORAL_TABLET | Freq: Every day | ORAL | Status: DC

## 2015-09-13 MED ORDER — FLUTICASONE PROPIONATE 50 MCG/ACT NA SUSP
1.0000 | Freq: Two times a day (BID) | NASAL | Status: DC
Start: 2015-09-13 — End: 2015-11-28

## 2015-09-13 MED ORDER — PSYLLIUM 28 % PO PACK: 1.0000 | PACK | Freq: Two times a day (BID) | ORAL | Status: DC | PRN

## 2015-09-13 MED ORDER — NYSTATIN 100000 UNIT/GM EX POWD: 1.0000 g | Freq: Three times a day (TID) | CUTANEOUS | Status: DC

## 2015-09-13 MED ORDER — CITALOPRAM HYDROBROMIDE 20 MG PO TABS: 20.0000 mg | ORAL_TABLET | Freq: Every day | ORAL | Status: DC

## 2015-09-13 MED FILL — FLUTICASONE PROP 50 MCG SPR: 50 | 30 days supply | Qty: 16 | Fill #0

## 2015-09-13 MED FILL — OMEPRAZOLE DR 40 MG CAPSULE: 40 | 30 days supply | Qty: 30 | Fill #0

## 2015-09-13 MED FILL — VIT D2 1.25 MG (50,000 UNIT: 1.25 MG | 84 days supply | Qty: 12 | Fill #0

## 2015-09-13 MED FILL — FLUCONAZOLE 150 MG TABLET: 150 | 14 days supply | Qty: 3 | Fill #0

## 2015-09-13 MED FILL — ?CITALOPRAM HBR 20 MG TABLE: 20 | 30 days supply | Qty: 30 | Fill #0

## 2015-09-13 MED FILL — ?SIMVASTATIN 20 MG TABLET: 20 MG | 30 days supply | Qty: 30 | Fill #0

## 2015-09-13 MED FILL — NYSTOP 100,000 UNITS/GM PWD: 100000 | 10 days supply | Qty: 60 | Fill #0

## 2015-09-13 MED FILL — ?CARVEDILOL 6.25 MG TABLET: 6.25 | 30 days supply | Qty: 60 | Fill #0

## 2015-09-13 MED FILL — SUCRALFATE 1 GM TABLET: 1 | 7 days supply | Qty: 30 | Fill #0

## 2015-09-13 NOTE — Patient Instructions (Signed)
Back Exercises If you have pain in your back, do these exercises 2-3 times each day or as told by your doctor. When the pain goes away, do the exercises once each day, but repeat the steps more times for each exercise (do more repetitions). If you do not have pain in your back, do these exercises once each day or as told by your doctor. EXERCISES Single Knee to Chest Do these steps 3-5 times in a row for each leg: 1. Lie on your back on a firm bed or the floor with your legs stretched out. 2. Bring one knee to your chest. 3. Hold your knee to your chest by grabbing your knee or thigh. 4. Pull on your knee until you feel a gentle stretch in your lower back. 5. Keep doing the stretch for 10-30 seconds. 6. Slowly let go of your leg and straighten it. Pelvic Tilt Do these steps 5-10 times in a row: 1. Lie on your back on a firm bed or the floor with your legs stretched out. 2. Bend your knees so they point up to the ceiling. Your feet should be flat on the floor. 3. Tighten your lower belly (abdomen) muscles to press your lower back against the floor. This will make your tailbone point up to the ceiling instead of pointing down to your feet or the floor. 4. Stay in this position for 5-10 seconds while you gently tighten your muscles and breathe evenly. Cat-Cow Do these steps until your lower back bends more easily: 1. Get on your hands and knees on a firm surface. Keep your hands under your shoulders, and keep your knees under your hips. You may put padding under your knees. 2. Let your head hang down, and make your tailbone point down to the floor so your lower back is round like the back of a cat. 3. Stay in this position for 5 seconds. 4. Slowly lift your head and make your tailbone point up to the ceiling so your back hangs low (sags) like the back of a cow. 5. Stay in this position for 5 seconds. Press-Ups Do these steps 5-10 times in a row: 1. Lie on your belly (face-down) on the  floor. 2. Place your hands near your head, about shoulder-width apart. 3. While you keep your back relaxed and keep your hips on the floor, slowly straighten your arms to raise the top half of your body and lift your shoulders. Do not use your back muscles. To make yourself more comfortable, you may change where you place your hands. 4. Stay in this position for 5 seconds. 5. Slowly return to lying flat on the floor. Bridges Do these steps 10 times in a row: 1. Lie on your back on a firm surface. 2. Bend your knees so they point up to the ceiling. Your feet should be flat on the floor. 3. Tighten your butt muscles and lift your butt off of the floor until your waist is almost as high as your knees. If you do not feel the muscles working in your butt and the back of your thighs, slide your feet 1-2 inches farther away from your butt. 4. Stay in this position for 3-5 seconds. 5. Slowly lower your butt to the floor, and let your butt muscles relax. If this exercise is too easy, try doing it with your arms crossed over your chest. Belly Crunches Do these steps 5-10 times in a row: 1. Lie on your back on a firm bed  or the floor with your legs stretched out. 2. Bend your knees so they point up to the ceiling. Your feet should be flat on the floor. 3. Cross your arms over your chest. 4. Tip your chin a little bit toward your chest but do not bend your neck. 5. Tighten your belly muscles and slowly raise your chest just enough to lift your shoulder blades a tiny bit off of the floor. 6. Slowly lower your chest and your head to the floor. Back Lifts Do these steps 5-10 times in a row: 1. Lie on your belly (face-down) with your arms at your sides, and rest your forehead on the floor. 2. Tighten the muscles in your legs and your butt. 3. Slowly lift your chest off of the floor while you keep your hips on the floor. Keep the back of your head in line with the curve in your back. Look at the floor while  you do this. 4. Stay in this position for 3-5 seconds. 5. Slowly lower your chest and your face to the floor. GET HELP IF:  Your back pain gets a lot worse when you do an exercise.  Your back pain does not lessen 2 hours after you exercise. If you have any of these problems, stop doing the exercises. Do not do them again unless your doctor says it is okay. GET HELP RIGHT AWAY IF:  You have sudden, very bad back pain. If this happens, stop doing the exercises. Do not do them again unless your doctor says it is okay.   This information is not intended to replace advice given to you by your health care provider. Make sure you discuss any questions you have with your health care provider.   Document Released: 06/22/2010 Document Revised: 02/08/2015 Document Reviewed: 07/14/2014 Elsevier Interactive Patient Education 2016 Elsevier Inc.    High-Fiber Diet Fiber, also called dietary fiber, is a type of carbohydrate found in fruits, vegetables, whole grains, and beans. A high-fiber diet can have many health benefits. Your health care provider may recommend a high-fiber diet to help: 7. Prevent constipation. Fiber can make your bowel movements more regular. 8. Lower your cholesterol. 9. Relieve hemorrhoids, uncomplicated diverticulosis, or irritable bowel syndrome. 10. Prevent overeating as part of a weight-loss plan. 11. Prevent heart disease, type 2 diabetes, and certain cancers. WHAT IS MY PLAN? The recommended daily intake of fiber includes: 5. 38 grams for men under age 77. 6. 30 grams for men over age 55. 7. 25 grams for women under age 51. 8. 21 grams for women over age 10. You can get the recommended daily intake of dietary fiber by eating a variety of fruits, vegetables, grains, and beans. Your health care provider may also recommend a fiber supplement if it is not possible to get enough fiber through your diet. WHAT DO I NEED TO KNOW ABOUT A HIGH-FIBER DIET? 6. Fiber supplements  have not been widely studied for their effectiveness, so it is better to get fiber through food sources. 7. Always check the fiber content on thenutrition facts label of any prepackaged food. Look for foods that contain at least 5 grams of fiber per serving. 8. Ask your dietitian if you have questions about specific foods that are related to your condition, especially if those foods are not listed in the following section. 9. Increase your daily fiber consumption gradually. Increasing your intake of dietary fiber too quickly may cause bloating, cramping, or gas. 10. Drink plenty of water. Water helps  you to digest fiber. WHAT FOODS CAN I EAT? Grains Whole-grain breads. Multigrain cereal. Oats and oatmeal. Brown rice. Barley. Bulgur wheat. Millet. Bran muffins. Popcorn. Rye wafer crackers. Vegetables Sweet potatoes. Spinach. Kale. Artichokes. Cabbage. Broccoli. Green peas. Carrots. Squash. Fruits Berries. Pears. Apples. Oranges. Avocados. Prunes and raisins. Dried figs. Meats and Other Protein Sources Navy, kidney, pinto, and soy beans. Split peas. Lentils. Nuts and seeds. Dairy Fiber-fortified yogurt. Beverages Fiber-fortified soy milk. Fiber-fortified orange juice. Other Fiber bars. The items listed above may not be a complete list of recommended foods or beverages. Contact your dietitian for more options. WHAT FOODS ARE NOT RECOMMENDED? Grains White bread. Pasta made with refined flour. White rice. Vegetables Fried potatoes. Canned vegetables. Well-cooked vegetables.  Fruits Fruit juice. Cooked, strained fruit. Meats and Other Protein Sources Fatty cuts of meat. Fried Environmental education officer or fried fish. Dairy Milk. Yogurt. Cream cheese. Sour cream. Beverages Soft drinks. Other Cakes and pastries. Butter and oils. The items listed above may not be a complete list of foods and beverages to avoid. Contact your dietitian for more information. WHAT ARE SOME TIPS FOR INCLUDING HIGH-FIBER FOODS  IN MY DIET? 6. Eat a wide variety of high-fiber foods. 7. Make sure that half of all grains consumed each day are whole grains. 8. Replace breads and cereals made from refined flour or white flour with whole-grain breads and cereals. 9. Replace white rice with brown rice, bulgur wheat, or millet. 10. Start the day with a breakfast that is high in fiber, such as a cereal that contains at least 5 grams of fiber per serving. 11. Use beans in place of meat in soups, salads, or pasta. 12. Eat high-fiber snacks, such as berries, raw vegetables, nuts, or popcorn.   This information is not intended to replace advice given to you by your health care provider. Make sure you discuss any questions you have with your health care provider.   Document Released: 05/20/2005 Document Revised: 06/10/2014 Document Reviewed: 11/02/2013 Elsevier Interactive Patient Education [employer]

## 2015-09-13 NOTE — Progress Notes (Signed)
Patient is here for Pain  Patient complains of pain on the right side which is intermittent sharp pain. Pain is currently scaled at an 8.  Patient states she does not have the funds to obtain her medications. MA advised patient of [facility] pharmacy and programs.  Patient has only taken carvedilol, spironolactone, and aspirin 81.

## 2015-09-13 NOTE — Progress Notes (Signed)
Abigail Sharp, is a 50 y.o. female  MKL:[id number]  [id number]:[id number]  DOB - 01-Jul-1965  Chief Complaint  Patient presents with  . Pain    Right side        Subjective:   Abigail Sharp is a 50 y.o. female here today for a follow up visit.  Last seen in clinic 3/22.  Numerous complaints today, nasal congestion, anxiety w/ more frequent signs of "panic" episodes, bilateral lower quadrant abdominal pains.  She was unable to pick up any of the meds I prescribed last time due to copayment.  I advised her to get her meds at our pharmacy now and they can work with her w/ a 30 day supply until her deductable issues are cleared.  +constipation, bms not regular.  Feels bloated.  She said last night, she had a hard time getting to sleep.  Started feeling anxious, her chest felt tight and couldn't catch her breath.  Denied CP.  Said symptoms gradually resolved. She did not try any that [place], SW breathing /relaxation techniques that were given to her on 08/28/15.  Pt has not seen a therapist as recommended either.   Patient has No headache, No chest pain, No Nausea, No new weakness tingling or numbness, No Cough - SOB. No si/hi/ah/vh.  +nasal congestion, no foods taste good.   ALLERGIES: Allergies  Allergen Reactions  . Sulfa Antibiotics Other (See Comments)    unknown    PAST MEDICAL HISTORY: Past Medical History  Diagnosis Date  . NICM (nonischemic cardiomyopathy) (HCC) 11/2014    EF is 25%  . Chronic systolic heart failure (HCC)   . HTN (hypertension)   . Microcytic anemia   . Pre-diabetes     MEDICATIONS AT HOME: Prior to Admission medications   Medication Sig Start Date End Date Taking? Authorizing Provider  aspirin EC 81 MG EC tablet Take 1 tablet (81 mg total) by mouth daily. 11/09/14  Yes Clydia Llano, MD  carvedilol (COREG) 6.25 MG tablet Take 1 tablet (6.25 mg total) by mouth 2 (two) times daily with a meal. 12/23/14  Yes Rosalio Macadamia, NP  spironolactone (ALDACTONE) 25 MG  tablet Take 1 tablet (25 mg total) by mouth 2 (two) times daily. 03/30/15  Yes Lyn Records, MD  citalopram (CELEXA) 20 MG tablet Take 1 tablet (20 mg total) by mouth daily. 09/13/15   Pete Glatter, MD  fluconazole (DIFLUCAN) 150 MG tablet Take 1 tablet (150 mg total) by mouth once. 09/13/15   Pete Glatter, MD  fluticasone (FLONASE) 50 MCG/ACT nasal spray Place 1 spray into both nostrils 2 (two) times daily. 09/13/15   Pete Glatter, MD  loratadine (CLARITIN) 10 MG tablet Take 1 tablet (10 mg total) by mouth daily. 09/13/15   Pete Glatter, MD  nystatin (MYCOSTATIN/NYSTOP) 100000 UNIT/GM POWD Apply 1 g topically 3 (three) times daily. 09/13/15   Pete Glatter, MD  omeprazole (PRILOSEC) 40 MG capsule Take 1 capsule (40 mg total) by mouth daily. 09/13/15   Pete Glatter, MD  psyllium (METAMUCIL SMOOTH TEXTURE) 28 % packet Take 1 packet by mouth 2 (two) times daily as needed. 09/13/15   Pete Glatter, MD  simvastatin (ZOCOR) 20 MG tablet Take 1 tablet (20 mg total) by mouth at bedtime. 09/13/15   Pete Glatter, MD  sucralfate (CARAFATE) 1 g tablet Take 1 tablet (1 g total) by mouth 4 (four) times daily -  with meals and at bedtime. 09/13/15   Zaray Gatchel  Marland Mcalpine, MD  Vitamin D, Ergocalciferol, (DRISDOL) 50000 units CAPS capsule Take 1 capsule (50,000 Units total) by mouth every 7 (seven) days. 09/13/15   Pete Glatter, MD     Objective:   Filed Vitals:   09/13/15 1024  BP: 136/67  Pulse: 75  Temp: 99.2 F (37.3 C)  TempSrc: Oral  Resp: 18  Height:  (1.626 m)  Weight: 203 lb (92.08 kg)  SpO2: 99%    Exam General appearance : Awake, alert, not in any distress. Speech Clear. Not toxic looking, Morbid obese, standing the entire time during exam about 2 feet away from me. She spent a lot of time watching her and lives with her. HEENT: Atraumatic and Normocephalic, pupils equally reactive to light. Neck: supple, no JVD. No cervical lymphadenopathy.  Chest: Good air entry  bilaterally, no added sounds. CVS: S1 S2 regular, no murmurs/gallups or rubs. Abdomen: Bowel sounds active, obese, mild ttp bilateral hip regions/girdle muslces, +bloated, no gaurding, rigidity or rebound. Extremities: B/L Lower Ext shows no edema, both legs are warm to touch Neurology: Awake alert, and oriented X 3, CN II-XII grossly intact, Non focal Skin: No Rash Psych: anxious, but not tearful today.    Data Review Lab Results  Component Value Date   HGBA1C 5.7* 11/05/2014     Assessment & Plan  Numerous somatic complaints today.  1. Anxiety NOS - recd she left the prescription of Celexa and try it out. She has not picked it up 3 weeks ago,  she remains in an anxious state currently. Suspect OCD component as well.  - She saw Asher Muir our social worker this after my appointment to further talk about therapy options.  2. htn - controlled - currently only taking coreg, asa and spironolactone.  3. Ischemic cardiomyopathy - she has fu appt Dr [name] Cards on 11/02/13, reminded of appt.  4. Seasonal allergies - recd she picks up claritin/flonase trial at pharmacy  5. bilataral lower quadrant pain, suspect musculoskeletal vs obstipation - trial warm/cold compresses, back exercise info given - high fiber diet advised - neg for uti  6. Polyuria? Urinary frequentcy - may be stress incontinence. Will fu. - Urinalysis Dipstick unremarkable   After spending with patient directly in initial visit, she asked RN to call me again to review my recommendations again (AVS).  I spent another going over that AVS with her and my recommendations.  Pt then went to see Asher Muir sw and spent quite a long time with her as well, appreciate her assistance.  Total facetime spent .   Return in about 2 weeks (around 09/27/2015).    The patient was given clear instructions to go to ER or return to medical center if symptoms don't improve, worsen or new problems develop. The patient  verbalized understanding. The patient was told to call to get lab results if they haven't heard anything in the next week.    Pete Glatter, MD, MBA/MHA [facility] [place], [place] [phone]   09/13/2015, 11:30 AM

## 2015-09-13 NOTE — Progress Notes (Signed)
ASSESSMENT: Pt experiencing OCD(Obsessive compulsive disorder). Pt needs to f/u with PCP and establish care with psychiatry; pt would benefit from brief therapeutic interventions.  Stage of Change: precontemplative  PLAN: 1. F/U with behavioral health consultant in as needed 2. Psychiatric Medications: Celexa 3. Behavioral recommendation(s):   Psychologist, counselling (number on back of insurance card) to obtain full list of psychiatrists in the area accepting [employer] -Consider making appointment with  Dr. Jannifer Franklin, as discussed in office  SUBJECTIVE: Pt. referred by Dr Julien Nordmann for counseling:  Pt. reports the following symptoms/concerns: Pt states that she needs to find a psychiatrist, wants to know the difference between a psychiatrist and therapist, and wants primarily to be able to obtain her BH medications; also says that it is "helping me to calm down, to talk to someone" about her perception that someone at visit today lied to her. She wants psychiatrist close to her house, so she may walk, if necessary.  Duration of problem: at least 9 months Severity: severe  OBJECTIVE: Orientation & Cognition: Oriented x3. Thought processes normal and appropriate to situation. Mood: irritable. Affect: appropriate Appearance: appropriate Risk of harm to self or others: no known risk of harm to self or others Substance use: none Assessments administered: None, refused today  Diagnosis: OCD CPT Code: F42.9 -------------------------------------------- Other(s) present in the room: none  Time spent with patient in exam room: 30 minutes, 12:00-12:30pm   Depression screen [facility] 2/9 08/28/2015  Decreased Interest 3  Down, Depressed, Hopeless 3  PHQ - 2 Score 6  Altered sleeping 2  Tired, decreased energy 1  Change in appetite 3  Feeling bad or failure about yourself  3  Trouble concentrating 1  Moving slowly or fidgety/restless 1  Suicidal thoughts 0  PHQ-9 Score 17    GAD 7 : Generalized  Anxiety Score 08/28/2015  Nervous, Anxious, on Edge 3  Control/stop worrying 3  Worry too much - different things 3  Trouble relaxing 1  Restless 0  Easily annoyed or irritable 3  Afraid - awful might happen 3  Total GAD 7 Score 16

## 2015-09-14 ENCOUNTER — Telehealth: Payer: Self-pay | Admitting: *Deleted

## 2015-09-14 MED FILL — ?SPIRONOLACTONE 25 MG TABLE: 25 | 30 days supply | Qty: 60 | Fill #0

## 2015-09-14 NOTE — Telephone Encounter (Signed)
Medical Assistant left message on patient's home and cell voicemail. Voicemail states to give a call back to [place] with [facility] at [phone]. The office will be closed tomorrow.

## 2015-09-14 NOTE — Telephone Encounter (Signed)
-----   Message from Pete Glatter, MD sent at 09/11/2015  7:07 PM EDT ----- plese tell her that her diagnostic mammogram was negative for breast cancer.  Will need screening mammogram in 1 year. thx

## 2015-09-21 NOTE — Telephone Encounter (Signed)
Pt. Came into facility requesting a letter form her PCP so that she can get help paying her utility bills. Please f/u with pt.

## 2015-09-25 ENCOUNTER — Encounter: Payer: Self-pay | Admitting: Internal Medicine

## 2015-09-25 ENCOUNTER — Ambulatory Visit: Payer: BLUE CROSS/BLUE SHIELD | Attending: Internal Medicine | Admitting: Internal Medicine

## 2015-09-25 VITALS — BP 114/72 | HR 79 | Temp 98.3°F | Resp 18 | Ht 67.0 in | Wt 200.4 lb

## 2015-09-25 DIAGNOSIS — K5900 Constipation, unspecified: Secondary | ICD-10-CM

## 2015-09-25 DIAGNOSIS — Z23 Encounter for immunization: Secondary | ICD-10-CM | POA: Insufficient documentation

## 2015-09-25 DIAGNOSIS — Z888 Allergy status to other drugs, medicaments and biological substances status: Secondary | ICD-10-CM | POA: Insufficient documentation

## 2015-09-25 DIAGNOSIS — I10 Essential (primary) hypertension: Secondary | ICD-10-CM | POA: Insufficient documentation

## 2015-09-25 DIAGNOSIS — Z79899 Other long term (current) drug therapy: Secondary | ICD-10-CM | POA: Insufficient documentation

## 2015-09-25 DIAGNOSIS — Z7982 Long term (current) use of aspirin: Secondary | ICD-10-CM | POA: Diagnosis not present

## 2015-09-25 DIAGNOSIS — M2550 Pain in unspecified joint: Secondary | ICD-10-CM | POA: Insufficient documentation

## 2015-09-25 DIAGNOSIS — F411 Generalized anxiety disorder: Secondary | ICD-10-CM | POA: Diagnosis not present

## 2015-09-25 DIAGNOSIS — I5022 Chronic systolic (congestive) heart failure: Secondary | ICD-10-CM | POA: Insufficient documentation

## 2015-09-25 DIAGNOSIS — D509 Iron deficiency anemia, unspecified: Secondary | ICD-10-CM | POA: Diagnosis not present

## 2015-09-25 DIAGNOSIS — R7303 Prediabetes: Secondary | ICD-10-CM | POA: Diagnosis not present

## 2015-09-25 MED ORDER — SENNOSIDES-DOCUSATE SODIUM 8.6-50 MG PO TABS: 1.0000 | ORAL_TABLET | Freq: Every day | ORAL | Status: DC

## 2015-09-25 NOTE — Patient Instructions (Signed)
Tdap Vaccine (Tetanus, Diphtheria and Pertussis): What You Need to Know 1. Why get vaccinated? Tetanus, diphtheria and pertussis are very serious diseases. Tdap vaccine can protect us from these diseases. And, Tdap vaccine given to pregnant women can protect newborn babies against pertussis. TETANUS (Lockjaw) is rare in the [place] today. It causes painful muscle tightening and stiffness, usually all over the body.  It can lead to tightening of muscles in the head and neck so you can't open your mouth, swallow, or sometimes even breathe. Tetanus kills about 1 out of 10 people who are infected even after receiving the best medical care. DIPHTHERIA is also rare in the [place] today. It can cause a thick coating to form in the back of the throat.  It can lead to breathing problems, heart failure, paralysis, and death. PERTUSSIS (Whooping Cough) causes severe coughing spells, which can cause difficulty breathing, vomiting and disturbed sleep.  It can also lead to weight loss, incontinence, and rib fractures. Up to 2 in 100 adolescents and 5 in 100 adults with pertussis are hospitalized or have complications, which could include pneumonia or death. These diseases are caused by bacteria. Diphtheria and pertussis are spread from person to person through secretions from coughing or sneezing. Tetanus enters the body through cuts, scratches, or wounds. Before vaccines, as many as 200,000 cases of diphtheria, 200,000 cases of pertussis, and hundreds of cases of tetanus, were reported in the [place] each year. Since vaccination began, reports of cases for tetanus and diphtheria have dropped by about 99% and for pertussis by about 80%. 2. Tdap vaccine Tdap vaccine can protect adolescents and adults from tetanus, diphtheria, and pertussis. One dose of Tdap is routinely given at age 11 or 12. People who did not get Tdap at that age should get it as soon as possible. Tdap is especially important  for healthcare professionals and anyone having close contact with a baby younger than 12 months. Pregnant women should get a dose of Tdap during every pregnancy, to protect the newborn from pertussis. Infants are most at risk for severe, life-threatening complications from pertussis. Another vaccine, called Td, protects against tetanus and diphtheria, but not pertussis. A Td booster should be given every 10 years. Tdap may be given as one of these boosters if you have never gotten Tdap before. Tdap may also be given after a severe cut or burn to prevent tetanus infection. Your doctor or the person giving you the vaccine can give you more information. Tdap may safely be given at the same time as other vaccines. 3. Some people should not get this vaccine  A person who has ever had a life-threatening allergic reaction after a previous dose of any diphtheria, tetanus or pertussis containing vaccine, OR has a severe allergy to any part of this vaccine, should not get Tdap vaccine. Tell the person giving the vaccine about any severe allergies.  Anyone who had coma or long repeated seizures within 7 days after a childhood dose of DTP or DTaP, or a previous dose of Tdap, should not get Tdap, unless a cause other than the vaccine was found. They can still get Td.  Talk to your doctor if you:  have seizures or another nervous system problem,  had severe pain or swelling after any vaccine containing diphtheria, tetanus or pertussis,  ever had a condition called Guillain-Barr Syndrome (GBS),  aren't feeling well on the day the shot is scheduled. 4. Risks With any medicine, including vaccines, there is   a chance of side effects. These are usually mild and go away on their own. Serious reactions are also possible but are rare. Most people who get Tdap vaccine do not have any problems with it. Mild problems following Tdap (Did not interfere with activities)  Pain where the shot was given (about 3 in 4  adolescents or 2 in 3 adults)  Redness or swelling where the shot was given (about 1 person in 5)  Mild fever of at least 100.4F (up to about 1 in 25 adolescents or 1 in 100 adults)  Headache (about 3 or 4 people in 10)  Tiredness (about 1 person in 3 or 4)  Nausea, vomiting, diarrhea, stomach ache (up to 1 in 4 adolescents or 1 in 10 adults)  Chills, sore joints (about 1 person in 10)  Body aches (about 1 person in 3 or 4)  Rash, swollen glands (uncommon) Moderate problems following Tdap (Interfered with activities, but did not require medical attention)  Pain where the shot was given (up to 1 in 5 or 6)  Redness or swelling where the shot was given (up to about 1 in 16 adolescents or 1 in 12 adults)  Fever over 102F (about 1 in 100 adolescents or 1 in 250 adults)  Headache (about 1 in 7 adolescents or 1 in 10 adults)  Nausea, vomiting, diarrhea, stomach ache (up to 1 or 3 people in 100)  Swelling of the entire arm where the shot was given (up to about 1 in 500). Severe problems following Tdap (Unable to perform usual activities; required medical attention)  Swelling, severe pain, bleeding and redness in the arm where the shot was given (rare). Problems that could happen after any vaccine:  People sometimes faint after a medical procedure, including vaccination. Sitting or lying down for about 15 minutes can help prevent fainting, and injuries caused by a fall. Tell your doctor if you feel dizzy, or have vision changes or ringing in the ears.  Some people get severe pain in the shoulder and have difficulty moving the arm where a shot was given. This happens very rarely.  Any medication can cause a severe allergic reaction. Such reactions from a vaccine are very rare, estimated at fewer than 1 in a million doses, and would happen within a few minutes to a few hours after the vaccination. As with any medicine, there is a very remote chance of a vaccine causing a serious  injury or death. The safety of vaccines is always being monitored. For more information, visit: www.cdc.gov/vaccinesafety/ 5. What if there is a serious problem? What should I look for?  Look for anything that concerns you, such as signs of a severe allergic reaction, very high fever, or unusual behavior.  Signs of a severe allergic reaction can include hives, swelling of the face and throat, difficulty breathing, a fast heartbeat, dizziness, and weakness. These would usually start a few minutes to a few hours after the vaccination. What should I do?  If you think it is a severe allergic reaction or other emergency that can't wait, call 9-1-1 or get the person to the nearest hospital. Otherwise, call your doctor.  Afterward, the reaction should be reported to the Vaccine Adverse Event Reporting System (VAERS). Your doctor might file this report, or you can do it yourself through the VAERS web site at www.vaers.hhs.gov, or by calling 1-800-822-7967. VAERS does not give medical advice.  6. The National Vaccine Injury Compensation Program The National Vaccine Injury Compensation Program (  VICP) is a federal program that was created to compensate people who may have been injured by certain vaccines. Persons who believe they may have been injured by a vaccine can learn about the program and about filing a claim by calling 1-800-338-2382 or visiting the VICP website at www.hrsa.gov/vaccinecompensation. There is a time limit to file a claim for compensation. 7. How can I learn more?  Ask your doctor. He or she can give you the vaccine package insert or suggest other sources of information.  Call your local or state health department.  Contact the Centers for Disease Control and Prevention (CDC):  Call 1-800-232-4636 (1-800-CDC-INFO) or  Visit CDC's website at www.cdc.gov/vaccines CDC Tdap Vaccine VIS (07/27/13)   This information is not intended to replace advice given to you by your health care  provider. Make sure you discuss any questions you have with your health care provider.   Document Released: 11/19/2011 Document Revised: 06/10/2014 Document Reviewed: 09/01/2013 Elsevier Interactive Patient Education 2016 Elsevier Inc.  

## 2015-09-25 NOTE — Progress Notes (Signed)
Patient is here for 2 week FU  Patient denies pain at this time.  Patient has taken medication and patient has eaten today.  Patient refuses to fill out PH9 and GAD7 sheet.  Patient tolerated Tdap Vaccine well today.  Patient received food today as well.

## 2015-09-25 NOTE — Progress Notes (Signed)
Abigail Sharp, is a 50 y.o. female  [id number]  HQI:[id number]  DOB - 05-27-1966  Chief Complaint  Patient presents with  . Follow-up    2 week        Subjective:   Abigail Sharp is a 50 y.o. female here today for a follow up visit.  States she does not feel much difference.  Has been on citalopram for almost 2wks now.  Her hip pains have improved, and bms a bit more regular.  She likes eating corn, and had good BM the other day with it.  Of note, she was not able to afford the psyillium, but is eating more fiber at home.  Had issues w/ her utilities almost being turned off recently.   Patient has No headache, No chest pain, No abdominal pain - No Nausea, No new weakness tingling or numbness, No Cough - SOB.  No problems updated.  ALLERGIES: Allergies  Allergen Reactions  . Sulfa Antibiotics Other (See Comments)    unknown    PAST MEDICAL HISTORY: Past Medical History  Diagnosis Date  . NICM (nonischemic cardiomyopathy) (HCC) 11/2014    EF is 25%  . Chronic systolic heart failure (HCC)   . HTN (hypertension)   . Microcytic anemia   . Pre-diabetes     MEDICATIONS AT HOME: Prior to Admission medications   Medication Sig Start Date End Date Taking? Authorizing Provider  aspirin EC 81 MG EC tablet Take 1 tablet (81 mg total) by mouth daily. 11/09/14  Yes Clydia Llano, MD  carvedilol (COREG) 6.25 MG tablet Take 1 tablet (6.25 mg total) by mouth 2 (two) times daily with a meal. 12/23/14  Yes Rosalio Macadamia, NP  citalopram (CELEXA) 20 MG tablet Take 1 tablet (20 mg total) by mouth daily. 09/13/15  Yes Pete Glatter, MD  fluconazole (DIFLUCAN) 150 MG tablet Take 1 tablet (150 mg total) by mouth once. 09/13/15  Yes Pete Glatter, MD  fluticasone (FLONASE) 50 MCG/ACT nasal spray Place 1 spray into both nostrils 2 (two) times daily. 09/13/15  Yes Pete Glatter, MD  loratadine (CLARITIN) 10 MG tablet Take 1 tablet (10 mg total) by mouth daily. 09/13/15  Yes Pete Glatter, MD  nystatin (MYCOSTATIN/NYSTOP) 100000 UNIT/GM POWD Apply 1 g topically 3 (three) times daily. 09/13/15  Yes Pete Glatter, MD  omeprazole (PRILOSEC) 40 MG capsule Take 1 capsule (40 mg total) by mouth daily. 09/13/15  Yes Dawn Marland Mcalpine, MD  psyllium (METAMUCIL SMOOTH TEXTURE) 28 % packet Take 1 packet by mouth 2 (two) times daily as needed. 09/13/15  Yes Pete Glatter, MD  simvastatin (ZOCOR) 20 MG tablet Take 1 tablet (20 mg total) by mouth at bedtime. 09/13/15  Yes Pete Glatter, MD  spironolactone (ALDACTONE) 25 MG tablet Take 1 tablet (25 mg total) by mouth 2 (two) times daily. 03/30/15  Yes Lyn Records, MD  sucralfate (CARAFATE) 1 g tablet Take 1 tablet (1 g total) by mouth 4 (four) times daily -  with meals and at bedtime. 09/13/15  Yes Pete Glatter, MD  Vitamin D, Ergocalciferol, (DRISDOL) 50000 units CAPS capsule Take 1 capsule (50,000 Units total) by mouth every 7 (seven) days. 09/13/15  Yes Dawn Marland Mcalpine, MD  senna-docusate (SENOKOT-S) 8.6-50 MG tablet Take 1 tablet by mouth daily. 09/25/15   Pete Glatter, MD     Objective:   Filed Vitals:   09/25/15 0958  BP: 114/72  Pulse: 79  Temp: 98.3  F (36.8 C)  TempSrc: Oral  Resp: 18  Height: 5\' 7"  (1.702 m)  Weight: 200 lb 6.4 oz (90.901 kg)  SpO2: 98%    Exam General appearance : Awake, alert, not in any distress. Speech Clear. Not toxic looking, appears much more calmer today HEENT: Atraumatic and Normocephalic, pupils equally reactive to light. Neck: supple, no JVD. No cervical lymphadenopathy.  Chest:Good air entry bilaterally, no added sounds. CVS: S1 S2 regular, no murmurs/gallups or rubs. Abdomen: Bowel sounds active, Non tender Extremities: B/L Lower Ext shows no edema, both legs are warm to touch Neurology: Awake alert, and oriented X 3, CN II-XII grossly intact, Non focal Skin:No Rash  Data Review Lab Results  Component Value Date   HGBA1C 5.7* 11/05/2014     Assessment & Plan     1. Generalized anxiety do with panic component - clinically appears much more calmer today, and less anxious! She is not right at my face today, nor is she pacing the room.  I believe Celexa is working, encouraged her to continue.  2. Arthralgias, improving ?placebo effect w/ celexa  3. Obstipation -encouraged high fiber, prn sennokot prescribed  4. tdap vaccine today.  Patient have been counseled extensively about nutrition and exercise  Return in about 4 weeks (around 10/23/2015).  The patient was given clear instructions to go to ER or return to medical center if symptoms don't improve, worsen or new problems develop. The patient verbalized understanding. The patient was told to call to get lab results if they haven't heard anything in the next week.    Pete Glatter, MD, MBA/MHA [facility] [place], [place] [phone]   09/25/2015, 10:49 AM

## 2015-09-29 NOTE — Telephone Encounter (Signed)
Request has been routed to PCP.

## 2015-10-02 ENCOUNTER — Telehealth: Payer: Self-pay | Admitting: *Deleted

## 2015-10-02 NOTE — Telephone Encounter (Signed)
I wrote the letter. Please call her to see how she would like to pick it up, etc. thanks

## 2015-10-02 NOTE — Telephone Encounter (Signed)
Medical Assistant left message on patient's home and cell voicemail. Voicemail states to give a call back to [place] with [facility] at [phone].   !!!Please inform patient of paperwork being placed at the front desk for pick up!!!

## 2015-10-02 NOTE — Telephone Encounter (Signed)
Letter has been placed the front desk for pick. VM was left for patient.

## 2015-11-03 ENCOUNTER — Telehealth: Payer: Self-pay | Admitting: Internal Medicine

## 2015-11-03 ENCOUNTER — Ambulatory Visit: Payer: Self-pay | Admitting: Interventional Cardiology

## 2015-11-03 DIAGNOSIS — F411 Generalized anxiety disorder: Secondary | ICD-10-CM

## 2015-11-03 MED ORDER — SUCRALFATE 1 G PO TABS: 1.0000 g | ORAL_TABLET | Freq: Three times a day (TID) | ORAL | Status: DC

## 2015-11-03 MED FILL — ?CITALOPRAM HBR 20 MG TABLE: 20 | 30 days supply | Qty: 30 | Fill #1

## 2015-11-03 MED FILL — ?CARVEDILOL 6.25 MG TABLET: 6.25 | 30 days supply | Qty: 60 | Fill #1

## 2015-11-03 MED FILL — ?SIMVASTATIN 20 MG TABLET: 20 MG | 30 days supply | Qty: 30 | Fill #1

## 2015-11-03 MED FILL — ?SPIRONOLACTONE 25 MG TABLE: 25 | 30 days supply | Qty: 60 | Fill #1

## 2015-11-03 NOTE — Telephone Encounter (Signed)
Patient called stating that she needs a refill on sucralfate. Also patient needs a letter stating that she's mentally unfit for employement with great concern about her mental illness. Patient is trying to apply for food an nutrition services.

## 2015-11-03 NOTE — Telephone Encounter (Signed)
I have refilled the sucralfate.  Will defer her other requests to Dr. Julien Nordmann and Marcelino Duster.

## 2015-11-07 NOTE — Telephone Encounter (Signed)
Clld pt - advsd she will need office visit for letter regarding her mental stability. Pt stated she has appt on 06/20 at 9 am. No earlier appts available - advsd pt to make sure she keeps appt. Pt stated she would.

## 2015-11-08 NOTE — Telephone Encounter (Signed)
Per PCP - clld pt - LMOVMTC re psychiatry referral.

## 2015-11-08 NOTE — Telephone Encounter (Signed)
i put in ref for psychiatry. Please have her set up appt w/ them for "mental stabililty" eval. thanks

## 2015-11-09 NOTE — Telephone Encounter (Signed)
Pt clld back in - advsd her of PCP's notation. Pt stated she understood.

## 2015-11-21 ENCOUNTER — Encounter: Payer: Self-pay | Admitting: Internal Medicine

## 2015-11-21 ENCOUNTER — Ambulatory Visit: Payer: BLUE CROSS/BLUE SHIELD | Attending: Internal Medicine | Admitting: Internal Medicine

## 2015-11-21 VITALS — BP 151/86 | HR 76 | Temp 98.8°F | Resp 14 | Ht 67.0 in | Wt 212.8 lb

## 2015-11-21 DIAGNOSIS — F411 Generalized anxiety disorder: Secondary | ICD-10-CM | POA: Diagnosis not present

## 2015-11-21 DIAGNOSIS — H538 Other visual disturbances: Secondary | ICD-10-CM | POA: Diagnosis not present

## 2015-11-21 DIAGNOSIS — K5900 Constipation, unspecified: Secondary | ICD-10-CM

## 2015-11-21 DIAGNOSIS — F32A Depression, unspecified: Secondary | ICD-10-CM | POA: Insufficient documentation

## 2015-11-21 DIAGNOSIS — I10 Essential (primary) hypertension: Secondary | ICD-10-CM

## 2015-11-21 DIAGNOSIS — F429 Obsessive-compulsive disorder, unspecified: Secondary | ICD-10-CM

## 2015-11-21 DIAGNOSIS — F329 Major depressive disorder, single episode, unspecified: Secondary | ICD-10-CM

## 2015-11-21 MED ORDER — CITALOPRAM HYDROBROMIDE 40 MG PO TABS: 40.0000 mg | ORAL_TABLET | Freq: Every day | ORAL | Status: DC

## 2015-11-21 MED ORDER — HYDROCHLOROTHIAZIDE 12.5 MG PO CAPS: 12.5000 mg | ORAL_CAPSULE | Freq: Every day | ORAL | Status: DC

## 2015-11-21 NOTE — Progress Notes (Signed)
Abigail Sharp, is a 50 y.o. female  ZOX:[id number]  WJX:[id number]  DOB - 11-05-1965  Chief Complaint  Patient presents with  . Follow-up        Subjective:   Abigail Sharp is a 50 y.o. female here today for a follow up visit for htn and gen anxiety/depression.  Per pt, her obstipation has improved considerably with increase fiber intake, eating more vegetables/fruits.  She still has mild anxiety/depression, has not been out of house for 1 wk until today, and realizes that she has OCD tendencies with frequent hand washing as well.  Denies si/hi/avh, but very worried about her son, has dreams sometimes about worrisome things.  Still w/ some body aches/myalgias, but about same as prior.  Per pt, she does not think the celexa 20 is enough, although overall she feels better than before it was started.  No particular diet changes except eating more vegs/fruits, 1/3 fresh, 1/3 canned goods, 1/3 frozen.  Mentions blurry vision, especially when trying to read things close, has to move ppwk further out to see things bettter.  Patient has No headache, No chest pain, No abdominal pain - No Nausea, No new weakness tingling or numbness, No Cough - SOB.  Problem  Generalized Anxiety Disorder  Depression (Emotion)  Ocd (Obsessive Compulsive Disorder)    ALLERGIES: Allergies  Allergen Reactions  . Sulfa Antibiotics Other (See Comments)    unknown    PAST MEDICAL HISTORY: Past Medical History  Diagnosis Date  . NICM (nonischemic cardiomyopathy) (HCC) 11/2014    EF is 25%  . Chronic systolic heart failure (HCC)   . HTN (hypertension)   . Microcytic anemia   . Pre-diabetes     MEDICATIONS AT HOME: Prior to Admission medications   Medication Sig Start Date End Date Taking? Authorizing Provider  aspirin EC 81 MG EC tablet Take 1 tablet (81 mg total) by mouth daily. 11/09/14  Yes Clydia Llano, MD  carvedilol (COREG) 6.25 MG tablet Take 1 tablet (6.25 mg total) by mouth 2 (two) times  daily with a meal. 12/23/14  Yes Rosalio Macadamia, NP  citalopram (CELEXA) 40 MG tablet Take 1 tablet (40 mg total) by mouth daily. 11/21/15  Yes Pete Glatter, MD  fluconazole (DIFLUCAN) 150 MG tablet Take 1 tablet (150 mg total) by mouth once. 09/13/15  Yes Pete Glatter, MD  fluticasone (FLONASE) 50 MCG/ACT nasal spray Place 1 spray into both nostrils 2 (two) times daily. 09/13/15  Yes Pete Glatter, MD  nystatin (MYCOSTATIN/NYSTOP) 100000 UNIT/GM POWD Apply 1 g topically 3 (three) times daily. 09/13/15  Yes Pete Glatter, MD  omeprazole (PRILOSEC) 40 MG capsule Take 1 capsule (40 mg total) by mouth daily. 09/13/15  Yes Pete Glatter, MD  simvastatin (ZOCOR) 20 MG tablet Take 1 tablet (20 mg total) by mouth at bedtime. 09/13/15  Yes Pete Glatter, MD  spironolactone (ALDACTONE) 25 MG tablet Take 1 tablet (25 mg total) by mouth 2 (two) times daily. 03/30/15  Yes Lyn Records, MD  sucralfate (CARAFATE) 1 g tablet Take 1 tablet (1 g total) by mouth 4 (four) times daily -  with meals and at bedtime. 11/03/15  Yes Pete Glatter, MD  Vitamin D, Ergocalciferol, (DRISDOL) 50000 units CAPS capsule Take 1 capsule (50,000 Units total) by mouth every 7 (seven) days. 09/13/15  Yes Pete Glatter, MD  hydrochlorothiazide (MICROZIDE) 12.5 MG capsule Take 1 capsule (12.5 mg total) by mouth daily. 11/21/15   Nehemiah Mcfarren T  Wania Longstreth, MD  loratadine (CLARITIN) 10 MG tablet Take 1 tablet (10 mg total) by mouth daily. Patient not taking: Reported on 11/21/2015 09/13/15   Pete Glatter, MD  psyllium (METAMUCIL SMOOTH TEXTURE) 28 % packet Take 1 packet by mouth 2 (two) times daily as needed. Patient not taking: Reported on 11/21/2015 09/13/15   Pete Glatter, MD  senna-docusate (SENOKOT-S) 8.6-50 MG tablet Take 1 tablet by mouth daily. Patient not taking: Reported on 11/21/2015 09/25/15   Pete Glatter, MD     Objective:   Filed Vitals:   11/21/15 0930  BP: 151/86  Pulse: 76  Temp: 98.8 F (37.1 C)    TempSrc: Oral  Resp: 14  Height: 5\' 7"  (1.702 m)  Weight: 212 lb 12.8 oz (96.525 kg)  SpO2: 97%    Exam General appearance : Awake, alert, not in any distress. Speech Clear. Not toxic looking, much less anxious appearing than ever since I first started seeing her. HEENT: Atraumatic and Normocephalic Neck: supple, no JVD.  Chest:Good air entry bilaterally, no added sounds. CVS: S1 S2 regular, no murmurs/gallups or rubs. Abdomen: Bowel sounds active, Non tender and not distended with no gaurding, rigidity or rebound. Extremities: B/L Lower Ext shows no edema, both legs are warm to touch Neurology: Awake alert, and oriented X 3, CN II-XII grossly intact, Non focal, appropriate,  Skin:No Rash  Data Review Lab Results  Component Value Date   HGBA1C 5.7* 11/05/2014    Depression screen PHQ 2/9 08/28/2015  Decreased Interest 3  Down, Depressed, Hopeless 3  PHQ - 2 Score 6  Altered sleeping 2  Tired, decreased energy 1  Change in appetite 3  Feeling bad or failure about yourself  3  Trouble concentrating 1  Moving slowly or fidgety/restless 1  Suicidal thoughts 0  PHQ-9 Score 17      Assessment & Plan   1. Essential hypertension Worse when Last I saw, goal sbp <140/90, perhaps due to more canned goods? She denies eating out, less processed food.  Encouraged DASH diet, completely rinse all veg in cans prior to eating to reduce salt load as well. - doing well on coreg 6.25 bid, asa 81 qd, spironolactone 25 bid, added hctz 12.5 daily for now- - fu in clinic 73month for bp chk.  2. Blurry vision, bilateral - suspect presbiopia,  - Ambulatory referral to Ophthalmology - if insurance will cover - recd pt buy some Reader glasses at [facility], very inexpensive.  3. Obstipation Better w/ more fruits /veg/fiber, continue to encouraged.  4. Generalized anxiety disorder,  Depression (emotion);  OCD (obsessive compulsive disorder) - she is aware she has OCD tendencies, - no  si/hi/avh - increase celexa 40 qd - will f/u ref speciallist on status of psychiatry referral.     Patient have been counseled extensively about nutrition and exercise  Return in about 4 weeks (around 12/19/2015) for bp /anxiety.  The patient was given clear instructions to go to ER or return to medical center if symptoms don't improve, worsen or new problems develop. The patient verbalized understanding. The patient was told to call to get lab results if they haven't heard anything in the next week.    Pete Glatter, MD, MBA/MHA [employer] [facility] [place], [place] [phone]   11/21/2015, 10:04 AM

## 2015-11-21 NOTE — Patient Instructions (Signed)
DASH Eating Plan  DASH stands for "Dietary Approaches to Stop Hypertension." The DASH eating plan is a healthy eating plan that has been shown to reduce high blood pressure (hypertension). Additional health benefits may include reducing the risk of type 2 diabetes mellitus, heart disease, and stroke. The DASH eating plan may also help with weight loss.  WHAT DO I NEED TO KNOW ABOUT THE DASH EATING PLAN?  For the DASH eating plan, you will follow these general guidelines:  · Choose foods with a percent daily value for sodium of less than 5% (as listed on the food label).  · Use salt-free seasonings or herbs instead of table salt or sea salt.  · Check with your health care provider or pharmacist before using salt substitutes.  · Eat lower-sodium products, often labeled as "lower sodium" or "no salt added."  · Eat fresh foods.  · Eat more vegetables, fruits, and low-fat dairy products.  · Choose whole grains. Look for the word "whole" as the first word in the ingredient list.  · Choose fish and skinless chicken or turkey more often than red meat. Limit fish, poultry, and meat to 6 oz (170 g) each day.  · Limit sweets, desserts, sugars, and sugary drinks.  · Choose heart-healthy fats.  · Limit cheese to 1 oz (28 g) per day.  · Eat more home-cooked food and less restaurant, buffet, and fast food.  · Limit fried foods.  · Cook foods using methods other than frying.  · Limit canned vegetables. If you do use them, rinse them well to decrease the sodium.  · When eating at a restaurant, ask that your food be prepared with less salt, or no salt if possible.  WHAT FOODS CAN I EAT?  Seek help from a dietitian for individual calorie needs.  Grains  Whole grain or whole wheat bread. Brown rice. Whole grain or whole wheat pasta. Quinoa, bulgur, and whole grain cereals. Low-sodium cereals. Corn or whole wheat flour tortillas. Whole grain cornbread. Whole grain crackers. Low-sodium crackers.  Vegetables  Fresh or frozen vegetables  (raw, steamed, roasted, or grilled). Low-sodium or reduced-sodium tomato and vegetable juices. Low-sodium or reduced-sodium tomato sauce and paste. Low-sodium or reduced-sodium canned vegetables.   Fruits  All fresh, canned (in natural juice), or frozen fruits.  Meat and Other Protein Products  Ground beef (85% or leaner), grass-fed beef, or beef trimmed of fat. Skinless chicken or turkey. Ground chicken or turkey. Pork trimmed of fat. All fish and seafood. Eggs. Dried beans, peas, or lentils. Unsalted nuts and seeds. Unsalted canned beans.  Dairy  Low-fat dairy products, such as skim or 1% milk, 2% or reduced-fat cheeses, low-fat ricotta or cottage cheese, or plain low-fat yogurt. Low-sodium or reduced-sodium cheeses.  Fats and Oils  Tub margarines without trans fats. Light or reduced-fat mayonnaise and salad dressings (reduced sodium). Avocado. Safflower, olive, or canola oils. Natural peanut or almond butter.  Other  Unsalted popcorn and pretzels.  The items listed above may not be a complete list of recommended foods or beverages. Contact your dietitian for more options.  WHAT FOODS ARE NOT RECOMMENDED?  Grains  White bread. White pasta. White rice. Refined cornbread. Bagels and croissants. Crackers that contain trans fat.  Vegetables  Creamed or fried vegetables. Vegetables in a cheese sauce. Regular canned vegetables. Regular canned tomato sauce and paste. Regular tomato and vegetable juices.  Fruits  Dried fruits. Canned fruit in light or heavy syrup. Fruit juice.  Meat and Other Protein   Products  Fatty cuts of meat. Ribs, chicken wings, bacon, sausage, bologna, salami, chitterlings, fatback, hot dogs, bratwurst, and packaged luncheon meats. Salted nuts and seeds. Canned beans with salt.  Dairy  Whole or 2% milk, cream, half-and-half, and cream cheese. Whole-fat or sweetened yogurt. Full-fat cheeses or blue cheese. Nondairy creamers and whipped toppings. Processed cheese, cheese spreads, or cheese  curds.  Condiments  Onion and garlic salt, seasoned salt, table salt, and sea salt. Canned and packaged gravies. Worcestershire sauce. Tartar sauce. Barbecue sauce. Teriyaki sauce. Soy sauce, including reduced sodium. Steak sauce. Fish sauce. Oyster sauce. Cocktail sauce. Horseradish. Ketchup and mustard. Meat flavorings and tenderizers. Bouillon cubes. Hot sauce. Tabasco sauce. Marinades. Taco seasonings. Relishes.  Fats and Oils  Butter, stick margarine, lard, shortening, ghee, and bacon fat. Coconut, palm kernel, or palm oils. Regular salad dressings.  Other  Pickles and olives. Salted popcorn and pretzels.  The items listed above may not be a complete list of foods and beverages to avoid. Contact your dietitian for more information.  WHERE CAN I FIND MORE INFORMATION?  National Heart, Lung, and Blood Institute: www.nhlbi.nih.gov/health/health-topics/topics/dash/     This information is not intended to replace advice given to you by your health care provider. Make sure you discuss any questions you have with your health care provider.     Document Released: 05/09/2011 Document Revised: 06/10/2014 Document Reviewed: 03/24/2013  Elsevier Interactive Patient Education ©2016 Elsevier Inc.

## 2015-11-21 NOTE — Telephone Encounter (Signed)
Pt. Called requesting to speak with her nurse b/c she thinks she has a sinus infection.  Please f/u with pt.

## 2015-11-22 ENCOUNTER — Telehealth: Payer: Self-pay | Admitting: Internal Medicine

## 2015-11-22 NOTE — Telephone Encounter (Signed)
Patient called and state that she forgot to mention sinus problems to the doctor and would like to know if she can be prescribed medication. Please follow up.

## 2015-11-22 NOTE — Telephone Encounter (Signed)
Please call pt that she has rx for flonase nasal spray in system, can get it filled, use consistently 2x day, will ease the sinus problems/seasonal allergies.

## 2015-11-23 ENCOUNTER — Telehealth: Payer: Self-pay | Admitting: *Deleted

## 2015-11-23 NOTE — Telephone Encounter (Signed)
Medical Assistant left message on patient's home and cell voicemail. Voicemail states to give a call back to [place] with [facility] at [phone].  !!!Please inform patient of nasal spray being ordered at the pharmacy. Patient may pickup and use for sinus concerns!!!

## 2015-11-24 ENCOUNTER — Telehealth: Payer: Self-pay | Admitting: Internal Medicine

## 2015-11-24 NOTE — Telephone Encounter (Signed)
Patient says that flonase is ineffective. Please follow up.

## 2015-11-28 MED ORDER — BUDESONIDE 32 MCG/ACT NA SUSP: 2.0000 | Freq: Every day | NASAL | Status: DC

## 2015-11-28 NOTE — Telephone Encounter (Signed)
Please call pt, i changed rx to budesonide nasal spray trial. Thanks.

## 2015-11-28 NOTE — Telephone Encounter (Signed)
Voicemail states to give a call back to [place] with [facility] at [phone]. Medical Assistant left message on patient's home and cell voicemail.   !!!Please inform patient of new nasal spray being ordered for pickup!!!

## 2015-12-08 ENCOUNTER — Telehealth: Payer: Self-pay | Admitting: Internal Medicine

## 2015-12-08 NOTE — Telephone Encounter (Signed)
[facility] called stating that pt was a no show to their appointment with them

## 2015-12-08 NOTE — Telephone Encounter (Signed)
Thank you for letting me know

## 2015-12-11 ENCOUNTER — Telehealth: Payer: Self-pay | Admitting: Internal Medicine

## 2015-12-11 ENCOUNTER — Encounter: Payer: Self-pay | Admitting: Internal Medicine

## 2015-12-11 ENCOUNTER — Telehealth: Payer: Self-pay

## 2015-12-11 NOTE — Telephone Encounter (Signed)
Contacted pt LVM informing pt that her letter is ready for pick up and it will be upfront at the office

## 2015-12-11 NOTE — Telephone Encounter (Signed)
Patient needs a letter to qualify for SNAP. Please follow up.

## 2015-12-11 NOTE — Telephone Encounter (Signed)
Done. Jovita Gamma letter to [place] CMA to call pt to pick up.

## 2015-12-22 ENCOUNTER — Other Ambulatory Visit: Payer: Self-pay | Admitting: Interventional Cardiology

## 2015-12-27 ENCOUNTER — Encounter: Payer: Self-pay | Admitting: Internal Medicine

## 2015-12-27 ENCOUNTER — Ambulatory Visit: Payer: BLUE CROSS/BLUE SHIELD | Attending: Internal Medicine | Admitting: Internal Medicine

## 2015-12-27 VITALS — BP 135/83 | Temp 98.2°F | Ht 67.0 in | Wt 213.2 lb

## 2015-12-27 DIAGNOSIS — I110 Hypertensive heart disease with heart failure: Secondary | ICD-10-CM | POA: Diagnosis not present

## 2015-12-27 DIAGNOSIS — Z79899 Other long term (current) drug therapy: Secondary | ICD-10-CM | POA: Diagnosis not present

## 2015-12-27 DIAGNOSIS — E559 Vitamin D deficiency, unspecified: Secondary | ICD-10-CM | POA: Insufficient documentation

## 2015-12-27 DIAGNOSIS — J309 Allergic rhinitis, unspecified: Secondary | ICD-10-CM | POA: Diagnosis not present

## 2015-12-27 DIAGNOSIS — Z7982 Long term (current) use of aspirin: Secondary | ICD-10-CM | POA: Insufficient documentation

## 2015-12-27 DIAGNOSIS — Z114 Encounter for screening for human immunodeficiency virus [HIV]: Secondary | ICD-10-CM | POA: Insufficient documentation

## 2015-12-27 DIAGNOSIS — I10 Essential (primary) hypertension: Secondary | ICD-10-CM

## 2015-12-27 DIAGNOSIS — I5022 Chronic systolic (congestive) heart failure: Secondary | ICD-10-CM | POA: Insufficient documentation

## 2015-12-27 DIAGNOSIS — Z124 Encounter for screening for malignant neoplasm of cervix: Secondary | ICD-10-CM | POA: Diagnosis not present

## 2015-12-27 DIAGNOSIS — F329 Major depressive disorder, single episode, unspecified: Secondary | ICD-10-CM | POA: Diagnosis not present

## 2015-12-27 DIAGNOSIS — F32A Depression, unspecified: Secondary | ICD-10-CM

## 2015-12-27 MED ORDER — FLUTICASONE PROPIONATE 50 MCG/ACT NA SUSP: 2.0000 | Freq: Every day | NASAL | 6 refills | Status: DC

## 2015-12-27 MED ORDER — CARVEDILOL 6.25 MG PO TABS: 6.2500 mg | ORAL_TABLET | Freq: Two times a day (BID) | ORAL | 11 refills | Status: DC

## 2015-12-27 MED ORDER — LORATADINE 10 MG PO TABS: 10.0000 mg | ORAL_TABLET | Freq: Every day | ORAL | 11 refills | Status: DC

## 2015-12-27 MED ORDER — FAMOTIDINE 20 MG PO TABS: 20.0000 mg | ORAL_TABLET | Freq: Two times a day (BID) | ORAL | 3 refills | Status: DC

## 2015-12-27 NOTE — Progress Notes (Signed)
Abigail Sharp, is a 50 y.o. female  YQM:[id number]  BMW:[id number]  DOB - [birth year]  Chief Complaint  Patient presents with  . Follow-up        Subjective:   Abigail Sharp is a 50 y.o. female here today for a follow up visit for depression, anxiety and htn. She has hx of allergic rhinitis, co of nasal congestion, but has not started the loratadine or budesonide nasal spray due to cost.  She also has OCD tendencies, which she is aware of.  Doing better taking her bp meds, she feels well overall.  Patient has No headache, No chest pain, No abdominal pain - No Nausea, No new weakness tingling or numbness, No Cough - SOB.  No problems updated.  ALLERGIES: Allergies  Allergen Reactions  . Sulfa Antibiotics Other (See Comments)    unknown    PAST MEDICAL HISTORY: Past Medical History:  Diagnosis Date  . Chronic systolic heart failure (HCC)   . HTN (hypertension)   . Microcytic anemia   . NICM (nonischemic cardiomyopathy) (HCC) 11/2014   EF is 25%  . Pre-diabetes     MEDICATIONS AT HOME: Prior to Admission medications   Medication Sig Start Date End Date Taking? Authorizing Provider  aspirin EC 81 MG EC tablet Take 1 tablet (81 mg total) by mouth daily. 11/09/14  Yes Clydia Llano, MD  carvedilol (COREG) 6.25 MG tablet Take 1 tablet (6.25 mg total) by mouth 2 (two) times daily with a meal. 12/27/15  Yes Pete Glatter, MD  citalopram (CELEXA) 40 MG tablet Take 1 tablet (40 mg total) by mouth daily. 11/21/15  Yes Pete Glatter, MD  fluconazole (DIFLUCAN) 150 MG tablet Take 1 tablet (150 mg total) by mouth once. 09/13/15  Yes Pete Glatter, MD  hydrochlorothiazide (MICROZIDE) 12.5 MG capsule Take 1 capsule (12.5 mg total) by mouth daily. 11/21/15  Yes Pete Glatter, MD  nystatin (MYCOSTATIN/NYSTOP) 100000 UNIT/GM POWD Apply 1 g topically 3 (three) times daily. 09/13/15  Yes Pete Glatter, MD  simvastatin (ZOCOR) 20 MG tablet Take 1 tablet (20 mg total) by mouth at  bedtime. 09/13/15  Yes Pete Glatter, MD  spironolactone (ALDACTONE) 25 MG tablet TAKE 1 TABLET BY MOUTH TWICE DAILY. 12/22/15  Yes Lyn Records, MD  Vitamin D, Ergocalciferol, (DRISDOL) 50000 units CAPS capsule Take 1 capsule (50,000 Units total) by mouth every 7 (seven) days. 09/13/15  Yes Pete Glatter, MD  famotidine (PEPCID) 20 MG tablet Take 1 tablet (20 mg total) by mouth 2 (two) times daily. 12/27/15 12/26/16  Pete Glatter, MD  fluticasone (FLONASE) 50 MCG/ACT nasal spray Place 2 sprays into both nostrils daily. 12/27/15   Pete Glatter, MD  loratadine (CLARITIN) 10 MG tablet Take 1 tablet (10 mg total) by mouth daily. 12/27/15   Pete Glatter, MD  psyllium (METAMUCIL SMOOTH TEXTURE) 28 % packet Take 1 packet by mouth 2 (two) times daily as needed. Patient not taking: Reported on 11/21/2015 09/13/15   Pete Glatter, MD  senna-docusate (SENOKOT-S) 8.6-50 MG tablet Take 1 tablet by mouth daily. Patient not taking: Reported on 11/21/2015 09/25/15   Pete Glatter, MD     Objective:   Vitals:   12/27/15 0937  BP: 135/83  Temp: 98.2 F (36.8 C)  TempSrc: Oral  SpO2: 99%  Weight: 213 lb 3.2 oz (96.7 kg)  Height:  (1.702 m)    Exam General appearance : Awake, alert, not in any distress.  Speech Clear. Not toxic looking, pleasant, looking better every time I see her. Mood good. HEENT: Atraumatic and Normocephalic, pupils equally reactive to light. Chest:Good air entry bilaterally, no added sounds. CVS: S1 S2 regular, no murmurs/gallups or rubs. Abdomen: Bowel sounds active, Non tender Extremities: B/L Lower Ext shows no edema, both legs are warm to touch Neurology: Awake alert, and oriented X 3, CN II-XII grossly intact, Non focal Skin:No Rash  Data Review Lab Results  Component Value Date   HGBA1C 5.7 (H) 11/05/2014    Depression screen PHQ 2/9 11/21/2015 08/28/2015  Decreased Interest 3 3  Down, Depressed, Hopeless 3 3  PHQ - 2 Score 6 6  Altered sleeping  2 2  Tired, decreased energy 2 1  Change in appetite 2 3  Feeling bad or failure about yourself  2 3  Trouble concentrating 2 1  Moving slowly or fidgety/restless 2 1  Suicidal thoughts 0 0  PHQ-9 Score 18 17      Assessment & Plan   1. Essential hypertension Doing well, continue bp meds, dash diet recd  2. Vitamin D deficiency Completed rx, will rechk levels. - VITAMIN D 25 Hydroxy (Vit-D Deficiency, Fractures)  3. Encounter for screening for HIV - HIV antibody (with reflex)  4. Cervical cancer screening Pt prefers to see specialist rather than done in our clinic. - Ambulatory referral to Obstetrics / Gynecology  5. Allergic rhinitis, unspecified allergic rhinitis type Unable to get budesonide nasal spray due to cost, trial flonase again, reitterated to pick up rx for clartin as well, pt has found cheap rx here in our clinic for this.  6. Depression (emotion) Overall doing very well since I initially started seeing her, no si/hi/avh, still some ocd tendencies, but overall mood/affect much improved. - continue celexa 40 qdy.     Patient have been counseled extensively about nutrition and exercise  Return in about 3 months (around 03/28/2016), or if symptoms worsen or fail to improve.  The patient was given clear instructions to go to ER or return to medical center if symptoms don't improve, worsen or new problems develop. The patient verbalized understanding. The patient was told to call to get lab results if they haven't heard anything in the next week.   This note has been created with Education officer, environmental. Any transcriptional errors are unintentional.   Pete Glatter, MD, MBA/MHA [facility] [place], [place] [phone]   12/27/2015, 9:59 AM

## 2015-12-27 NOTE — Patient Instructions (Addendum)
Allergic Rhinitis Allergic rhinitis is when the mucous membranes in the nose respond to allergens. Allergens are particles in the air that cause your body to have an allergic reaction. This causes you to release allergic antibodies. Through a chain of events, these eventually cause you to release histamine into the blood stream. Although meant to protect the body, it is this release of histamine that causes your discomfort, such as frequent sneezing, congestion, and an itchy, runny nose.  CAUSES Seasonal allergic rhinitis (hay fever) is caused by pollen allergens that may come from grasses, trees, and weeds. Year-round allergic rhinitis (perennial allergic rhinitis) is caused by allergens such as house dust mites, pet dander, and mold spores. SYMPTOMS  Nasal stuffiness (congestion).  Itchy, runny nose with sneezing and tearing of the eyes. DIAGNOSIS Your health care provider can help you determine the allergen or allergens that trigger your symptoms. If you and your health care provider are unable to determine the allergen, skin or blood testing may be used. Your health care provider will diagnose your condition after taking your health history and performing a physical exam. Your health care provider may assess you for other related conditions, such as asthma, pink eye, or an ear infection. TREATMENT Allergic rhinitis does not have a cure, but it can be controlled by:  Medicines that block allergy symptoms. These may include allergy shots, nasal sprays, and oral antihistamines.  Avoiding the allergen. Hay fever may often be treated with antihistamines in pill or nasal spray forms. Antihistamines block the effects of histamine. There are over-the-counter medicines that may help with nasal congestion and swelling around the eyes. Check with your health care provider before taking or giving this medicine. If avoiding the allergen or the medicine prescribed do not work, there are many new medicines  your health care provider can prescribe. Stronger medicine may be used if initial measures are ineffective. Desensitizing injections can be used if medicine and avoidance does not work. Desensitization is when a patient is given ongoing shots until the body becomes less sensitive to the allergen. Make sure you follow up with your health care provider if problems continue. HOME CARE INSTRUCTIONS It is not possible to completely avoid allergens, but you can reduce your symptoms by taking steps to limit your exposure to them. It helps to know exactly what you are allergic to so that you can avoid your specific triggers. SEEK MEDICAL CARE IF:  You have a fever.  You develop a cough that does not stop easily (persistent).  You have shortness of breath.  You start wheezing.  Symptoms interfere with normal daily activities.   This information is not intended to replace advice given to you by your health care provider. Make sure you discuss any questions you have with your health care provider.   Document Released: 02/12/2001 Document Revised: 06/10/2014 Document Reviewed: 01/25/2013 Elsevier Interactive Patient Education 2016 Elsevier Inc.  - DASH Eating Plan DASH stands for "Dietary Approaches to Stop Hypertension." The DASH eating plan is a healthy eating plan that has been shown to reduce high blood pressure (hypertension). Additional health benefits may include reducing the risk of type 2 diabetes mellitus, heart disease, and stroke. The DASH eating plan may also help with weight loss. WHAT DO I NEED TO KNOW ABOUT THE DASH EATING PLAN? For the DASH eating plan, you will follow these general guidelines:  Choose foods with a percent daily value for sodium of less than 5% (as listed on the food label).  Use  salt-free seasonings or herbs instead of table salt or sea salt.  Check with your health care provider or pharmacist before using salt substitutes.  Eat lower-sodium products, often  labeled as "lower sodium" or "no salt added."  Eat fresh foods.  Eat more vegetables, fruits, and low-fat dairy products.  Choose whole grains. Look for the word "whole" as the first word in the ingredient list.  Choose fish and skinless chicken or [place] more often than red meat. Limit fish, poultry, and meat to 6 oz (170 g) each day.  Limit sweets, desserts, sugars, and sugary drinks.  Choose heart-healthy fats.  Limit cheese to 1 oz (28 g) per day.  Eat more home-cooked food and less restaurant, buffet, and fast food.  Limit fried foods.  Cook foods using methods other than frying.  Limit canned vegetables. If you do use them, rinse them well to decrease the sodium.  When eating at a restaurant, ask that your food be prepared with less salt, or no salt if possible. WHAT FOODS CAN I EAT? Seek help from a dietitian for individual calorie needs. Grains Whole grain or whole wheat bread. Brown rice. Whole grain or whole wheat pasta. Quinoa, bulgur, and whole grain cereals. Low-sodium cereals. Corn or whole wheat flour tortillas. Whole grain cornbread. Whole grain crackers. Low-sodium crackers. Vegetables Fresh or frozen vegetables (raw, steamed, roasted, or grilled). Low-sodium or reduced-sodium tomato and vegetable juices. Low-sodium or reduced-sodium tomato sauce and paste. Low-sodium or reduced-sodium canned vegetables.  Fruits All fresh, canned (in natural juice), or frozen fruits. Meat and Other Protein Products Ground beef (85% or leaner), grass-fed beef, or beef trimmed of fat. Skinless chicken or [place]. Ground chicken or [place]. Pork trimmed of fat. All fish and seafood. Eggs. Dried beans, peas, or lentils. Unsalted nuts and seeds. Unsalted canned beans. Dairy Low-fat dairy products, such as skim or 1% milk, 2% or reduced-fat cheeses, low-fat ricotta or cottage cheese, or plain low-fat yogurt. Low-sodium or reduced-sodium cheeses. Fats and Oils Tub margarines without  trans fats. Light or reduced-fat mayonnaise and salad dressings (reduced sodium). Avocado. Safflower, olive, or canola oils. Natural peanut or almond butter. Other Unsalted popcorn and pretzels. The items listed above may not be a complete list of recommended foods or beverages. Contact your dietitian for more options. WHAT FOODS ARE NOT RECOMMENDED? Grains White bread. White pasta. White rice. Refined cornbread. Bagels and croissants. Crackers that contain trans fat. Vegetables Creamed or fried vegetables. Vegetables in a cheese sauce. Regular canned vegetables. Regular canned tomato sauce and paste. Regular tomato and vegetable juices. Fruits Dried fruits. Canned fruit in light or heavy syrup. Fruit juice. Meat and Other Protein Products Fatty cuts of meat. Ribs, chicken wings, bacon, sausage, bologna, salami, chitterlings, fatback, hot dogs, bratwurst, and packaged luncheon meats. Salted nuts and seeds. Canned beans with salt. Dairy Whole or 2% milk, cream, half-and-half, and cream cheese. Whole-fat or sweetened yogurt. Full-fat cheeses or blue cheese. Nondairy creamers and whipped toppings. Processed cheese, cheese spreads, or cheese curds. Condiments Onion and garlic salt, seasoned salt, table salt, and sea salt. Canned and packaged gravies. Worcestershire sauce. Tartar sauce. Barbecue sauce. Teriyaki sauce. Soy sauce, including reduced sodium. Steak sauce. Fish sauce. Oyster sauce. Cocktail sauce. Horseradish. Ketchup and mustard. Meat flavorings and tenderizers. Bouillon cubes. Hot sauce. Tabasco sauce. Marinades. Taco seasonings. Relishes. Fats and Oils Butter, stick margarine, lard, shortening, ghee, and bacon fat. Coconut, palm kernel, or palm oils. Regular salad dressings. Other Pickles and olives. Salted popcorn and pretzels.  The items listed above may not be a complete list of foods and beverages to avoid. Contact your dietitian for more information. WHERE CAN I FIND MORE  INFORMATION? National Heart, Lung, and Blood Institute: [employer]Prom[employer].it   This information is not intended to replace advice given to you by your health care provider. Make sure you discuss any questions you have with your health care provider.   Document Released: 05/09/2011 Document Revised: 06/10/2014 Document Reviewed: 03/24/2013 Elsevier Interactive Patient Education [employer]  -

## 2015-12-28 LAB — VITAMIN D 25 HYDROXY (VIT D DEFICIENCY, FRACTURES): VIT D 25 HYDROXY: 34 ng/mL (ref 30–100)

## 2015-12-28 LAB — HIV ANTIBODY (ROUTINE TESTING W REFLEX): HIV: NONREACTIVE

## 2016-01-03 ENCOUNTER — Telehealth: Payer: Self-pay | Admitting: Internal Medicine

## 2016-01-03 NOTE — Telephone Encounter (Signed)
Contacted pt to let her know that her letter is ready. Pt states she will be by to pick it up after lunch

## 2016-01-03 NOTE — Telephone Encounter (Signed)
Forward to pcp

## 2016-01-03 NOTE — Telephone Encounter (Signed)
Pt. Came into facility to drop off two notes for her nurse. Pt. Stated she would like for her notes to be seen by her PCP. Envelope will be put in PCP box.

## 2016-01-03 NOTE — Telephone Encounter (Signed)
Left LM message.  sucrafate recd as short term, and omeprazole short term course the better as well. pepcid bid and prn TUMs recd.  Letter for snaps written. Will give to CMA.

## 2016-01-03 NOTE — Telephone Encounter (Signed)
Pt came into facility to drop off a personal letter that she wrote for Dr. Julien Nordmann regarding food stamps. Pt didn't want to give me any further details. She stated that doctor knew about her situation. Please follow up [phone]. Placed pt's personal letter in mailbox.   Thank you, De Burrs

## 2016-01-16 ENCOUNTER — Ambulatory Visit: Payer: Self-pay | Admitting: Obstetrics and Gynecology

## 2016-01-16 ENCOUNTER — Other Ambulatory Visit: Payer: Self-pay | Admitting: Internal Medicine

## 2016-01-24 ENCOUNTER — Ambulatory Visit (INDEPENDENT_AMBULATORY_CARE_PROVIDER_SITE_OTHER): Payer: BLUE CROSS/BLUE SHIELD | Admitting: Psychiatry

## 2016-01-24 DIAGNOSIS — F429 Obsessive-compulsive disorder, unspecified: Secondary | ICD-10-CM

## 2016-01-24 DIAGNOSIS — F411 Generalized anxiety disorder: Secondary | ICD-10-CM

## 2016-01-24 DIAGNOSIS — F329 Major depressive disorder, single episode, unspecified: Secondary | ICD-10-CM

## 2016-01-24 DIAGNOSIS — F32A Depression, unspecified: Secondary | ICD-10-CM
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
## 2016-01-30 ENCOUNTER — Telehealth: Payer: Self-pay | Admitting: Internal Medicine

## 2016-01-30 ENCOUNTER — Encounter: Payer: Self-pay | Admitting: Internal Medicine

## 2016-01-30 LAB — HM DIABETES EYE EXAM

## 2016-01-30 NOTE — Telephone Encounter (Signed)
Pt came into office requesting to speak to PCP regarding rx for prescription glasses, pt states she has an issue. Please f/up

## 2016-01-31 NOTE — Telephone Encounter (Signed)
Will forward to pcp

## 2016-02-06 ENCOUNTER — Encounter: Payer: Self-pay | Admitting: Internal Medicine

## 2016-02-06 NOTE — Progress Notes (Signed)
Pt seen at [facility] 01/30/16, no dm  Retinopathy noted.

## 2016-02-06 NOTE — Telephone Encounter (Signed)
Called pt at home.  Confirmed dob. Saw Dr Dione Booze opth 01/30/16, no dm retionopathy.  Has rx for new glasses, but needs to take it to have filled, but most likely will need to pay for it out of pocket.  She will try to get the prescriptions vis community assistance plan, she will see if she can do it.  She recently went to see [facility], has f/u appt on Tues.  No si/hi/avh.  Spent over on phone w/ patient.

## 2016-02-13 ENCOUNTER — Other Ambulatory Visit: Payer: Self-pay | Admitting: Internal Medicine

## 2016-02-13 ENCOUNTER — Ambulatory Visit (INDEPENDENT_AMBULATORY_CARE_PROVIDER_SITE_OTHER): Payer: BLUE CROSS/BLUE SHIELD | Admitting: Clinical

## 2016-02-13 DIAGNOSIS — F329 Major depressive disorder, single episode, unspecified: Secondary | ICD-10-CM

## 2016-02-13 DIAGNOSIS — F411 Generalized anxiety disorder: Secondary | ICD-10-CM | POA: Diagnosis not present

## 2016-02-13 DIAGNOSIS — F429 Obsessive-compulsive disorder, unspecified: Secondary | ICD-10-CM

## 2016-02-13 DIAGNOSIS — F32A Depression, unspecified: Secondary | ICD-10-CM

## 2016-02-14 ENCOUNTER — Ambulatory Visit: Payer: Self-pay | Admitting: Internal Medicine

## 2016-02-15 ENCOUNTER — Encounter (HOSPITAL_COMMUNITY): Payer: Self-pay | Admitting: Clinical
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
## 2016-02-20 ENCOUNTER — Telehealth: Payer: Self-pay | Admitting: Internal Medicine

## 2016-02-20 NOTE — Telephone Encounter (Signed)
Patient called the office to speak to nurse regarding the headaches and nausea she is experiencing. Patient wants to know if its safe for her to take [facility] powder or goody's in the meantime or if PCP can prescribe a medication for headache. I informed pt that she will need to set up an appt for PCP to see her but unfortunately there aren't any slots open. Please follow up.  Thank you.

## 2016-02-20 NOTE — Telephone Encounter (Signed)
Will forward to pcp

## 2016-02-21 NOTE — Telephone Encounter (Signed)
For occasionally headaches, exedrin migraines works well, take w/ food. Can also alternate with tylenol.  Caution on bc powder or goodys - if take this, take w/ food as well, and do not take additional exedrin/nsaids with it since [facility] powder already has caffeine and aspirin in it. Drink lots of fluid, and rest helps get rid of headaches faster. thanks

## 2016-02-22 NOTE — Telephone Encounter (Signed)
Contacted pt to give her Dr. Julien Nordmann recommendations and pt states she has been taking medication with food. Pt states it seems like it wants to go away but every once in awhile it wants to come back

## 2016-02-26 NOTE — Telephone Encounter (Signed)
Thanks for update. Ask her to make appt if headaches persist. thx

## 2016-02-27 NOTE — Telephone Encounter (Signed)
Contacted pt to make aware that if headaches persist to make an appointment with Dr. Julien Nordmann pt did not answer lvm regarding information

## 2016-03-03 NOTE — Progress Notes (Signed)
Cardiology Office Note    Date:  03/05/2016   ID:  Abigail Sharp, DOB 1966-01-08, MRN [id number]  PCP:  Pete Glatter, MD  Cardiologist: Lesleigh Noe, MD   Chief Complaint  Patient presents with  . Congestive Heart Failure    History of Present Illness:  Abigail Sharp is a 50 y.o. female who presents for f/u of Systolic heart failure, hypertension, prediabetes, LVEF 25% improved to 55% on medical therapy.  Abigail Sharp is doing well. No dyspnea, chest pain, orthopnea, PND, or syncope. No chest discomfort. No concerns.  Past Medical History:  Diagnosis Date  . Chronic systolic heart failure (HCC)   . HTN (hypertension)   . Microcytic anemia   . NICM (nonischemic cardiomyopathy) (HCC) 11/2014   EF is 25%  . Pre-diabetes     Past Surgical History:  Procedure Laterality Date  . CARDIAC CATHETERIZATION N/A 11/07/2014   Procedure: Right/Left Heart Cath and Coronary Angiography;  Surgeon: Runell Gess, MD;  Location: [facility];  Service: Cardiovascular;  Laterality: N/A;  . TONSILLECTOMY    . TUBAL LIGATION      Current Medications: Outpatient Medications Prior to Visit  Medication Sig Dispense Refill  . aspirin EC 81 MG EC tablet Take 1 tablet (81 mg total) by mouth daily. 30 tablet 0  . carvedilol (COREG) 6.25 MG tablet Take 1 tablet (6.25 mg total) by mouth 2 (two) times daily with a meal. 60 tablet 11  . citalopram (CELEXA) 40 MG tablet Take 1 tablet (40 mg total) by mouth daily. 30 tablet 2  . famotidine (PEPCID) 20 MG tablet Take 1 tablet (20 mg total) by mouth 2 (two) times daily. 60 tablet 3  . fluticasone (FLONASE) 50 MCG/ACT nasal spray Place 2 sprays into both nostrils daily. 16 g 6  . hydrochlorothiazide (MICROZIDE) 12.5 MG capsule Take 1 capsule (12.5 mg total) by mouth daily. 30 capsule 2  . loratadine (CLARITIN) 10 MG tablet Take 1 tablet (10 mg total) by mouth daily. 30 tablet 11  . NYSTATIN powder APPLY 1 GRAM TO THE AFFECTED AREA THREE TIMES DAILY  30 g 0  . simvastatin (ZOCOR) 20 MG tablet Take 1 tablet (20 mg total) by mouth at bedtime. 30 tablet 3  . spironolactone (ALDACTONE) 25 MG tablet TAKE 1 TABLET BY MOUTH TWICE DAILY. 60 tablet 5  . fluconazole (DIFLUCAN) 150 MG tablet Take 1 tablet (150 mg total) by mouth once. (Patient not taking: Reported on 03/05/2016) 3 tablet 0  . fluconazole (DIFLUCAN) 150 MG tablet TAKE 1 TABLET(150 MG) BY MOUTH 1 TIME (Patient not taking: Reported on 03/05/2016) 3 tablet 0  . psyllium (METAMUCIL SMOOTH TEXTURE) 28 % packet Take 1 packet by mouth 2 (two) times daily as needed. (Patient not taking: Reported on 03/05/2016) 30 packet 3  . senna-docusate (SENOKOT-S) 8.6-50 MG tablet Take 1 tablet by mouth daily. (Patient not taking: Reported on 03/05/2016) 60 tablet 2  . Vitamin D, Ergocalciferol, (DRISDOL) 50000 units CAPS capsule Take 1 capsule (50,000 Units total) by mouth every 7 (seven) days. (Patient not taking: Reported on 03/05/2016) 12 capsule 0   No facility-administered medications prior to visit.      Allergies:   Sulfa antibiotics   Social History   Social History  . Marital status: Single    Spouse name: N/A  . Number of children: N/A  . Years of education: N/A   Social History Main Topics  . Smoking status: Never Smoker  . Smokeless  tobacco: Never Used  . Alcohol use No  . Drug use: No  . Sexual activity: Yes   Other Topics Concern  . None   Social History Narrative  . None     Family History:  The patient's family history includes Diabetes in her father, mother, and sister; Heart disease in her father; Hypertension in her father and mother.   ROS:   Please see the history of present illness.    Nausea, vomiting, anxiety, headaches, dizziness, depression, vision disturbance.  All other systems reviewed and are negative.   PHYSICAL EXAM:   VS:  BP 116/72   Pulse 63   Ht 5\' 7"  (1.702 m)   Wt 222 lb (100.7 kg)   BMI 34.77 kg/m    GEN: Well nourished, well developed, in no  acute distress  HEENT: normal  Neck: no JVD, carotid bruits, or masses Cardiac: RRR; no murmurs, rubs, or gallops,no edema  Respiratory:  clear to auscultation bilaterally, normal work of breathing GI: soft, nontender, nondistended, + BS MS: no deformity or atrophy  Skin: warm and dry, no rash Neuro:  Alert and Oriented x 3, Strength and sensation are intact Psych: euthymic mood, full affect  Wt Readings from Last 3 Encounters:  03/05/16 222 lb (100.7 kg)  12/27/15 213 lb 3.2 oz (96.7 kg)  11/21/15 212 lb 12.8 oz (96.5 kg)      Studies/Labs Reviewed:   EKG:  EKG  Sinus bradycardia and nonspecific T-wave flattening  Recent Labs: 08/23/2015: BUN 12; Creat 1.01; Hemoglobin 12.6; Platelets 219; Potassium 3.9; Sodium 141; TSH 3.33   Lipid Panel    Component Value Date/Time   CHOL 189 08/23/2015 1028   TRIG 290 (H) 08/23/2015 1028   HDL 36 (L) 08/23/2015 1028   CHOLHDL 5.3 (H) 08/23/2015 1028   VLDL 58 (H) 08/23/2015 1028   LDLCALC 95 08/23/2015 1028    Additional studies/ records that were reviewed today include:  Echocardiogram in 2016 demonstrated EF of 55%    ASSESSMENT:    1. Chronic systolic heart failure (HCC)   2. Essential hypertension   3. Non-ischemic cardiomyopathy (HCC)      PLAN:  In order of problems listed above:  1. Systolic heart failure is resolved to chronic diastolic heart failure. No volume overload. Medical regimen. Asymptomatic. Basic metabolic panel will be obtained today to exclude renal insufficiency or/and hyperkalemia. 2. Excellent control. Low salt diet as advocated. 3. Resolved as noted above.    Medication Adjustments/Labs and Tests Ordered: Current medicines are reviewed at length with the patient today.  Concerns regarding medicines are outlined above.  Medication changes, Labs and Tests ordered today are listed in the Patient Instructions below. There are no Patient Instructions on file for this visit.   Signed, Lesleigh NoeHenry W Smith  III, MD  03/05/2016 8:19 AM    [facility] [address], [place], [place]  [address] Phone: [phone]; Fax: [phone]

## 2016-03-05 ENCOUNTER — Encounter: Payer: Self-pay | Admitting: Interventional Cardiology

## 2016-03-05 ENCOUNTER — Ambulatory Visit (INDEPENDENT_AMBULATORY_CARE_PROVIDER_SITE_OTHER): Payer: BLUE CROSS/BLUE SHIELD | Admitting: Interventional Cardiology

## 2016-03-05 VITALS — BP 116/72 | HR 63 | Ht 67.0 in | Wt 222.0 lb

## 2016-03-05 DIAGNOSIS — I10 Essential (primary) hypertension: Secondary | ICD-10-CM | POA: Diagnosis not present

## 2016-03-05 DIAGNOSIS — I428 Other cardiomyopathies: Secondary | ICD-10-CM | POA: Diagnosis not present

## 2016-03-05 DIAGNOSIS — I5022 Chronic systolic (congestive) heart failure: Secondary | ICD-10-CM | POA: Diagnosis not present

## 2016-03-05 LAB — BASIC METABOLIC PANEL
BUN: 29 mg/dL — AB (ref 7–25)
CHLORIDE: 102 mmol/L (ref 98–110)
CO2: 24 mmol/L (ref 20–31)
CREATININE: 1.11 mg/dL — AB (ref 0.50–1.10)
Calcium: 8.9 mg/dL (ref 8.6–10.2)
GLUCOSE: 80 mg/dL (ref 65–99)
POTASSIUM: 5.1 mmol/L (ref 3.5–5.3)
Sodium: 136 mmol/L (ref 135–146)

## 2016-03-05 NOTE — Patient Instructions (Signed)
Medication Instructions:  Your physician recommends that you continue on your current medications as directed. Please refer to the Current Medication list given to you today.   Labwork: Lab work to be done today--BMP  Testing/Procedures: none  Follow-Up: Your physician wants you to follow-up in: 6 months.  You will receive a reminder letter in the mail two months in advance. If you don't receive a letter, please call our office to schedule the follow-up appointment.   Any Other Special Instructions Will Be Listed Below (If Applicable).     If you need a refill on your cardiac medications before your next appointment, please call your pharmacy.   

## 2016-03-20 ENCOUNTER — Other Ambulatory Visit: Payer: Self-pay | Admitting: Internal Medicine

## 2016-04-04 ENCOUNTER — Other Ambulatory Visit: Payer: Self-pay | Admitting: Internal Medicine

## 2016-04-17 ENCOUNTER — Ambulatory Visit: Payer: BLUE CROSS/BLUE SHIELD | Attending: Internal Medicine | Admitting: Internal Medicine

## 2016-04-17 ENCOUNTER — Encounter: Payer: Self-pay | Admitting: Internal Medicine

## 2016-04-17 VITALS — BP 136/85 | HR 97 | Temp 98.3°F | Resp 18 | Ht 67.0 in | Wt 229.0 lb

## 2016-04-17 DIAGNOSIS — I429 Cardiomyopathy, unspecified: Secondary | ICD-10-CM | POA: Insufficient documentation

## 2016-04-17 DIAGNOSIS — R7303 Prediabetes: Secondary | ICD-10-CM | POA: Insufficient documentation

## 2016-04-17 DIAGNOSIS — I110 Hypertensive heart disease with heart failure: Secondary | ICD-10-CM | POA: Insufficient documentation

## 2016-04-17 DIAGNOSIS — I5022 Chronic systolic (congestive) heart failure: Secondary | ICD-10-CM | POA: Insufficient documentation

## 2016-04-17 DIAGNOSIS — F329 Major depressive disorder, single episode, unspecified: Secondary | ICD-10-CM | POA: Diagnosis not present

## 2016-04-17 DIAGNOSIS — E559 Vitamin D deficiency, unspecified: Secondary | ICD-10-CM | POA: Insufficient documentation

## 2016-04-17 DIAGNOSIS — I10 Essential (primary) hypertension: Secondary | ICD-10-CM | POA: Diagnosis not present

## 2016-04-17 DIAGNOSIS — F429 Obsessive-compulsive disorder, unspecified: Secondary | ICD-10-CM | POA: Diagnosis not present

## 2016-04-17 DIAGNOSIS — I428 Other cardiomyopathies: Secondary | ICD-10-CM | POA: Diagnosis not present

## 2016-04-17 DIAGNOSIS — J0121 Acute recurrent ethmoidal sinusitis: Secondary | ICD-10-CM | POA: Insufficient documentation

## 2016-04-17 DIAGNOSIS — D509 Iron deficiency anemia, unspecified: Secondary | ICD-10-CM | POA: Insufficient documentation

## 2016-04-17 DIAGNOSIS — F411 Generalized anxiety disorder: Secondary | ICD-10-CM | POA: Insufficient documentation

## 2016-04-17 DIAGNOSIS — F32A Depression, unspecified: Secondary | ICD-10-CM

## 2016-04-17 MED ORDER — CITALOPRAM HYDROBROMIDE 40 MG PO TABS: 40.0000 mg | ORAL_TABLET | Freq: Every day | ORAL | 3 refills | Status: DC

## 2016-04-17 MED ORDER — SACCHAROMYCES BOULARDII 250 MG PO CAPS: 250.0000 mg | ORAL_CAPSULE | Freq: Two times a day (BID) | ORAL | 0 refills | Status: DC

## 2016-04-17 MED ORDER — LORATADINE 10 MG PO TABS: 10.0000 mg | ORAL_TABLET | Freq: Every day | ORAL | 11 refills | Status: DC

## 2016-04-17 MED ORDER — SIMVASTATIN 20 MG PO TABS: 20.0000 mg | ORAL_TABLET | Freq: Every day | ORAL | 3 refills | Status: DC

## 2016-04-17 MED ORDER — ASPIRIN 81 MG PO TBEC: 81.0000 mg | DELAYED_RELEASE_TABLET | Freq: Every day | ORAL | 3 refills | Status: DC

## 2016-04-17 MED ORDER — SPIRONOLACTONE 25 MG PO TABS: 25.0000 mg | ORAL_TABLET | Freq: Two times a day (BID) | ORAL | 5 refills | Status: DC

## 2016-04-17 MED ORDER — FLUTICASONE PROPIONATE 50 MCG/ACT NA SUSP: 2.0000 | Freq: Every day | NASAL | 6 refills | Status: DC

## 2016-04-17 MED ORDER — AMOXICILLIN-POT CLAVULANATE 875-125 MG PO TABS: 1.0000 | ORAL_TABLET | Freq: Two times a day (BID) | ORAL | 0 refills | Status: DC

## 2016-04-17 MED ORDER — HYDROCHLOROTHIAZIDE 25 MG PO TABS: 25.0000 mg | ORAL_TABLET | Freq: Every day | ORAL | 3 refills | Status: DC

## 2016-04-17 NOTE — Patient Instructions (Signed)
Sinusitis, Adult Sinusitis is soreness and inflammation of your sinuses. Sinuses are hollow spaces in the bones around your face. They are located:  Around your eyes.  In the middle of your forehead.  Behind your nose.  In your cheekbones. Your sinuses and nasal passages are lined with a stringy fluid (mucus). Mucus normally drains out of your sinuses. When your nasal tissues get inflamed or swollen, the mucus can get trapped or blocked so air cannot flow through your sinuses. This lets bacteria, viruses, and funguses grow, and that leads to infection. Follow these instructions at home: Medicines  Take, use, or apply over-the-counter and prescription medicines only as told by your doctor. These may include nasal sprays.  If you were prescribed an antibiotic medicine, take it as told by your doctor. Do not stop taking the antibiotic even if you start to feel better. Hydrate and Humidify  Drink enough water to keep your pee (urine) clear or pale yellow.  Use a cool mist humidifier to keep the humidity level in your home above 50%.  Breathe in steam for 10-15 minutes, 3-4 times a day or as told by your doctor. You can do this in the bathroom while a hot shower is running.  Try not to spend time in cool or dry air. Rest  Rest as much as possible.  Sleep with your head raised (elevated).  Make sure to get enough sleep each night. General instructions  Put a warm, moist washcloth on your face 3-4 times a day or as told by your doctor. This will help with discomfort.  Wash your hands often with soap and water. If there is no soap and water, use hand sanitizer.  Do not smoke. Avoid being around people who are smoking (secondhand smoke).  Keep all follow-up visits as told by your doctor. This is important. Contact a doctor if:  You have a fever.  Your symptoms get worse.  Your symptoms do not get better within 10 days. Get help right away if:  You have a very bad  headache.  You cannot stop throwing up (vomiting).  You have pain or swelling around your face or eyes.  You have trouble seeing.  You feel confused.  Your neck is stiff.  You have trouble breathing. This information is not intended to replace advice given to you by your health care provider. Make sure you discuss any questions you have with your health care provider. Document Released: 11/06/2007 Document Revised: 01/14/2016 Document Reviewed: 03/15/2015 Elsevier Interactive Patient Education  2017 [place] Maintenance, Female Introduction Adopting a healthy lifestyle and getting preventive care can go a long way to promote health and wellness. Talk with your health care provider about what schedule of regular examinations is right for you. This is a good chance for you to check in with your provider about disease prevention and staying healthy. In between checkups, there are plenty of things you can do on your own. Experts have done a lot of research about which lifestyle changes and preventive measures are most likely to keep you healthy. Ask your health care provider for more information. Weight and diet Eat a healthy diet  Be sure to include plenty of vegetables, fruits, low-fat dairy products, and lean protein.  Do not eat a lot of foods high in solid fats, added sugars, or salt.  Get regular exercise. This is one of the most important things you can do for your health.  Most adults should exercise  for at least 150 minutes each week. The exercise should increase your heart rate and make you sweat (moderate-intensity exercise).  Most adults should also do strengthening exercises at least twice a week. This is in addition to the moderate-intensity exercise. Maintain a healthy weight  Body mass index (BMI) is a measurement that can be used to identify possible weight problems. It estimates body fat based on height and weight. Your health care provider can help  determine your BMI and help you achieve or maintain a healthy weight.  For females 31 years of age and older:  A BMI below 18.5 is considered underweight.  A BMI of 18.5 to 24.9 is normal.  A BMI of 25 to 29.9 is considered overweight.  A BMI of 30 and above is considered obese. Watch levels of cholesterol and blood lipids  You should start having your blood tested for lipids and cholesterol at 49 years of age, then have this test every 5 years.  You may need to have your cholesterol levels checked more often if:  Your lipid or cholesterol levels are high.  You are older than 49 years of age.  You are at high risk for heart disease. Cancer screening Lung Cancer  Lung cancer screening is recommended for adults 25-30 years old who are at high risk for lung cancer because of a history of smoking.  A yearly low-dose CT scan of the lungs is recommended for people who:  Currently smoke.  Have quit within the past 15 years.  Have at least a 30-pack-year history of smoking. A pack year is smoking an average of one pack of cigarettes a day for 1 year.  Yearly screening should continue until it has been 15 years since you quit.  Yearly screening should stop if you develop a health problem that would prevent you from having lung cancer treatment. Breast Cancer  Practice breast self-awareness. This means understanding how your breasts normally appear and feel.  It also means doing regular breast self-exams. Let your health care provider know about any changes, no matter how small.  If you are in your 20s or 30s, you should have a clinical breast exam (CBE) by a health care provider every 1-3 years as part of a regular health exam.  If you are 63 or older, have a CBE every year. Also consider having a breast X-ray (mammogram) every year.  If you have a family history of breast cancer, talk to your health care provider about genetic screening.  If you are at high risk for breast  cancer, talk to your health care provider about having an MRI and a mammogram every year.  Breast cancer gene (BRCA) assessment is recommended for women who have family members with BRCA-related cancers. BRCA-related cancers include:  Breast.  Ovarian.  Tubal.  Peritoneal cancers.  Results of the assessment will determine the need for genetic counseling and BRCA1 and BRCA2 testing. Cervical Cancer  Your health care provider may recommend that you be screened regularly for cancer of the pelvic organs (ovaries, uterus, and vagina). This screening involves a pelvic examination, including checking for microscopic changes to the surface of your cervix (Pap test). You may be encouraged to have this screening done every 3 years, beginning at age 34.  For women ages 22-65, health care providers may recommend pelvic exams and Pap testing every 3 years, or they may recommend the Pap and pelvic exam, combined with testing for human papilloma virus (HPV), every 5 years. Some types  of HPV increase your risk of cervical cancer. Testing for HPV may also be done on women of any age with unclear Pap test results.  Other health care providers may not recommend any screening for nonpregnant women who are considered low risk for pelvic cancer and who do not have symptoms. Ask your health care provider if a screening pelvic exam is right for you.  If you have had past treatment for cervical cancer or a condition that could lead to cancer, you need Pap tests and screening for cancer for at least 20 years after your treatment. If Pap tests have been discontinued, your risk factors (such as having a new sexual partner) need to be reassessed to determine if screening should resume. Some women have medical problems that increase the chance of getting cervical cancer. In these cases, your health care provider may recommend more frequent screening and Pap tests. Colorectal Cancer  This type of cancer can be detected and  often prevented.  Routine colorectal cancer screening usually begins at 49 years of age and continues through 49 years of age.  Your health care provider may recommend screening at an earlier age if you have risk factors for colon cancer.  Your health care provider may also recommend using home test kits to check for hidden blood in the stool.  A small camera at the end of a tube can be used to examine your colon directly (sigmoidoscopy or colonoscopy). This is done to check for the earliest forms of colorectal cancer.  Routine screening usually begins at age 9.  Direct examination of the colon should be repeated every 5-10 years through 48 years of age. However, you may need to be screened more often if early forms of precancerous polyps or small growths are found. Skin Cancer  Check your skin from head to toe regularly.  Tell your health care provider about any new moles or changes in moles, especially if there is a change in a mole's shape or color.  Also tell your health care provider if you have a mole that is larger than the size of a pencil eraser.  Always use sunscreen. Apply sunscreen liberally and repeatedly throughout the day.  Protect yourself by wearing long sleeves, pants, a wide-brimmed hat, and sunglasses whenever you are outside. Heart disease, diabetes, and high blood pressure  High blood pressure causes heart disease and increases the risk of stroke. High blood pressure is more likely to develop in:  People who have blood pressure in the high end of the normal range (130-139/85-89 mm Hg).  People who are overweight or obese.  People who are African American.  If you are 52-2 years of age, have your blood pressure checked every 3-5 years. If you are 72 years of age or older, have your blood pressure checked every year. You should have your blood pressure measured twice-once when you are at a hospital or clinic, and once when you are not at a hospital or clinic.  Record the average of the two measurements. To check your blood pressure when you are not at a hospital or clinic, you can use:  An automated blood pressure machine at a pharmacy.  A home blood pressure monitor.  If you are between 65 years and 75 years old, ask your health care provider if you should take aspirin to prevent strokes.  Have regular diabetes screenings. This involves taking a blood sample to check your fasting blood sugar level.  If you are at a normal  weight and have a low risk for diabetes, have this test once every three years after 49 years of age.  If you are overweight and have a high risk for diabetes, consider being tested at a younger age or more often. Preventing infection Hepatitis B  If you have a higher risk for hepatitis B, you should be screened for this virus. You are considered at high risk for hepatitis B if:  You were born in a country where hepatitis B is common. Ask your health care provider which countries are considered high risk.  Your parents were born in a high-risk country, and you have not been immunized against hepatitis B (hepatitis B vaccine).  You have HIV or AIDS.  You use needles to inject street drugs.  You live with someone who has hepatitis B.  You have had sex with someone who has hepatitis B.  You get hemodialysis treatment.  You take certain medicines for conditions, including cancer, organ transplantation, and autoimmune conditions. Hepatitis C  Blood testing is recommended for:  Everyone born from [birth year] through 1964.  Anyone with known risk factors for hepatitis C. Sexually transmitted infections (STIs)  You should be screened for sexually transmitted infections (STIs) including gonorrhea and chlamydia if:  You are sexually active and are younger than 49 years of age.  You are older than 49 years of age and your health care provider tells you that you are at risk for this type of infection.  Your sexual activity  has changed since you were last screened and you are at an increased risk for chlamydia or gonorrhea. Ask your health care provider if you are at risk.  If you do not have HIV, but are at risk, it may be recommended that you take a prescription medicine daily to prevent HIV infection. This is called pre-exposure prophylaxis (PrEP). You are considered at risk if:  You are sexually active and do not regularly use condoms or know the HIV status of your partner(s).  You take drugs by injection.  You are sexually active with a partner who has HIV. Talk with your health care provider about whether you are at high risk of being infected with HIV. If you choose to begin PrEP, you should first be tested for HIV. You should then be tested every 3 months for as long as you are taking PrEP. Pregnancy  If you are premenopausal and you may become pregnant, ask your health care provider about preconception counseling.  If you may become pregnant, take 400 to 800 micrograms (mcg) of folic acid every day.  If you want to prevent pregnancy, talk to your health care provider about birth control (contraception). Osteoporosis and menopause  Osteoporosis is a disease in which the bones lose minerals and strength with aging. This can result in serious bone fractures. Your risk for osteoporosis can be identified using a bone density scan.  If you are 67 years of age or older, or if you are at risk for osteoporosis and fractures, ask your health care provider if you should be screened.  Ask your health care provider whether you should take a calcium or vitamin D supplement to lower your risk for osteoporosis.  Menopause may have certain physical symptoms and risks.  Hormone replacement therapy may reduce some of these symptoms and risks. Talk to your health care provider about whether hormone replacement therapy is right for you. Follow these instructions at home:  Schedule regular health, dental, and eye  exams.  Stay  current with your immunizations.  Do not use any tobacco products including cigarettes, chewing tobacco, or electronic cigarettes.  If you are pregnant, do not drink alcohol.  If you are breastfeeding, limit how much and how often you drink alcohol.  Limit alcohol intake to no more than 1 drink per day for nonpregnant women. One drink equals 12 ounces of beer, 5 ounces of wine, or 1 ounces of hard liquor.  Do not use street drugs.  Do not share needles.  Ask your health care provider for help if you need support or information about quitting drugs.  Tell your health care provider if you often feel depressed.  Tell your health care provider if you have ever been abused or do not feel safe at home. This information is not intended to replace advice given to you by your health care provider. Make sure you discuss any questions you have with your health care provider. Document Released: 12/03/2010 Document Revised: 10/26/2015 Document Reviewed: 02/21/2015  2017 Elsevier

## 2016-04-17 NOTE — Progress Notes (Signed)
Abigail Sharp, is a 50 y.o. female  WLN:[id number]  DEY:[id number]  DOB - [birth year]  No chief complaint on file.       Subjective:   Abigail Sharp is a 50 y.o. female here today for a follow up visit.  C/o of sinusitis ha/congestion/right sided headache since Saturday. Congested, no taste w/ food and unable to smell anything last few days.  Denies visual changes, denies n/v. Subjective fevers yesterday.  Gets chronic occipital headaches as well, comes and goes for years, unchanged. Denies photophobia. Hx of recurrent sinusitis.  Hx of anxiety/depression/agoraphobia/ocd . Pt notes she has been going out of house more and does couponing.  Getting therapy now, and has appt w/ psychiatry in Dec. Denies si/hi/avh    Patient has No headache, No chest pain, No abdominal pain - No Nausea, No new weakness tingling or numbness, No Cough - SOB.  No problems updated.  ALLERGIES: Allergies  Allergen Reactions  . Sulfa Antibiotics Other (See Comments)    unknown    PAST MEDICAL HISTORY: Past Medical History:  Diagnosis Date  . Chronic systolic heart failure (HCC)   . HTN (hypertension)   . Microcytic anemia   . NICM (nonischemic cardiomyopathy) (HCC) 11/2014   EF is 25%  . Pre-diabetes     MEDICATIONS AT HOME: Prior to Admission medications   Medication Sig Start Date End Date Taking? Authorizing Provider  aspirin 81 MG EC tablet Take 1 tablet (81 mg total) by mouth daily. 04/17/16  Yes Pete Glatter, MD  carvedilol (COREG) 6.25 MG tablet Take 1 tablet (6.25 mg total) by mouth 2 (two) times daily with a meal. 12/27/15  Yes Pete Glatter, MD  citalopram (CELEXA) 40 MG tablet Take 1 tablet (40 mg total) by mouth daily. 04/17/16  Yes Pete Glatter, MD  famotidine (PEPCID) 20 MG tablet Take 1 tablet (20 mg total) by mouth 2 (two) times daily. 12/27/15 12/26/16 Yes Lesean Woolverton T Julien Nordmann, MD  fluticasone (FLONASE) 50 MCG/ACT nasal spray Place 2 sprays into both nostrils daily.  04/17/16  Yes Pete Glatter, MD  simvastatin (ZOCOR) 20 MG tablet Take 1 tablet (20 mg total) by mouth at bedtime. 04/17/16  Yes Pete Glatter, MD  spironolactone (ALDACTONE) 25 MG tablet Take 1 tablet (25 mg total) by mouth 2 (two) times daily. 04/17/16  Yes Pete Glatter, MD  amoxicillin-clavulanate (AUGMENTIN) 875-125 MG tablet Take 1 tablet by mouth 2 (two) times daily. 04/17/16   Pete Glatter, MD  hydrochlorothiazide (HYDRODIURIL) 25 MG tablet Take 1 tablet (25 mg total) by mouth daily. 04/17/16   Pete Glatter, MD  loratadine (CLARITIN) 10 MG tablet Take 1 tablet (10 mg total) by mouth daily. 04/17/16   Pete Glatter, MD  NYSTATIN powder APPLY 1 GRAM TO THE AFFECTED AREA THREE TIMES DAILY Patient not taking: Reported on 04/17/2016 04/04/16   Pete Glatter, MD  saccharomyces boulardii (FLORASTOR) 250 MG capsule Take 1 capsule (250 mg total) by mouth 2 (two) times daily. 04/17/16   Pete Glatter, MD     Objective:   Vitals:   04/17/16 1016  BP: 136/85  Pulse: 97  Resp: 18  Temp: 98.3 F (36.8 C)  SpO2: 97%  Weight: 229 lb (103.9 kg)  Height: 5\' 7"  (1.702 m)    Exam General appearance : Awake, alert, not in any distress. Speech Clear. Not toxic looking, morbid obese, pleasant HEENT: Atraumatic and Normocephalic, pupils equally reactive to light.  Nasal congestion,  mild ttp at right max sinus and right temp/facial sinus.  Neck: supple, no JVD. No cervical lymphadenopathy.    Chest:Good air entry bilaterally, no added sounds. CVS: S1 S2 regular, no murmurs/gallups or rubs. Abdomen: Bowel sounds active, Non tender and not distended with no gaurding, rigidity or rebound. Extremities: B/L Lower Ext shows no edema, both legs are warm to touch Neurology: Awake alert, and oriented X 3, CN II-XII grossly intact, Non focal Skin:No Rash  Data Review Lab Results  Component Value Date   HGBA1C 5.7 (H) 11/05/2014    Depression screen PHQ 2/9 11/21/2015 08/28/2015   Decreased Interest 3 3  Down, Depressed, Hopeless 3 3  PHQ - 2 Score 6 6  Altered sleeping 2 2  Tired, decreased energy 2 1  Change in appetite 2 3  Feeling bad or failure about yourself  2 3  Trouble concentrating 2 1  Moving slowly or fidgety/restless 2 1  Suicidal thoughts 0 0  PHQ-9 Score 18 17      Assessment & Plan   1. Acute recurrent ethmoidal sinusitis - encouraged pt to pick up claritin, she has not been taking - rx augmentin 10 days - continue flonase - probiotics encouraged  2. Chronic systolic heart failure (HCC), w/ hx of non-ischemic cm Euvolemic, on spironolaction 25bid. Per cards.  3. Essential hypertension - new target goals w/ recent AHA recds <130/80 - continue coreg 6.25bid, asa 81,  - increase hctz to 25 qd.  4.  Depression, unspecified depression type Seeing psychiatry and therapy Continue celexa 40 qd  6. Generalized anxiety disorder Per #4  7. Obsessive-compulsive disorder, unspecified type Per #4  8. Vitamin D deficiency - VITAMIN D 25 Hydroxy (Vit-D Deficiency, Fractures) - rechk, if good than otc.  9. Needs papsmear - she has been putting it off, encouraged to get done w/ OB or myself.   Patient have been counseled extensively about nutrition and exercise  Return in about 3 months (around 07/18/2016), or if symptoms worsen or fail to improve, for or sooner 15mo for pap.  The patient was given clear instructions to go to ER or return to medical center if symptoms don't improve, worsen or new problems develop. The patient verbalized understanding. The patient was told to call to get lab results if they haven't heard anything in the next week.   This note has been created with Education officer, environmentalDragon speech recognition software and smart phrase technology. Any transcriptional errors are unintentional.   Pete Glatterawn T Dale Ribeiro, MD, MBA/MHA [facility] [place], [place] [phone]   04/17/2016, 12:35 PM

## 2016-05-01 ENCOUNTER — Other Ambulatory Visit: Payer: Self-pay | Admitting: Internal Medicine

## 2016-05-14 ENCOUNTER — Encounter (HOSPITAL_COMMUNITY): Payer: Self-pay | Admitting: Psychiatry

## 2016-05-14 ENCOUNTER — Ambulatory Visit (INDEPENDENT_AMBULATORY_CARE_PROVIDER_SITE_OTHER): Payer: BLUE CROSS/BLUE SHIELD | Admitting: Psychiatry

## 2016-05-14 VITALS — BP 122/74 | HR 87 | Ht 65.0 in | Wt 235.0 lb

## 2016-05-14 DIAGNOSIS — Z7982 Long term (current) use of aspirin: Secondary | ICD-10-CM

## 2016-05-14 DIAGNOSIS — Z8249 Family history of ischemic heart disease and other diseases of the circulatory system: Secondary | ICD-10-CM

## 2016-05-14 DIAGNOSIS — F411 Generalized anxiety disorder: Secondary | ICD-10-CM

## 2016-05-14 DIAGNOSIS — G4701 Insomnia due to medical condition: Secondary | ICD-10-CM | POA: Diagnosis not present

## 2016-05-14 DIAGNOSIS — Z9889 Other specified postprocedural states: Secondary | ICD-10-CM

## 2016-05-14 DIAGNOSIS — Z882 Allergy status to sulfonamides status: Secondary | ICD-10-CM

## 2016-05-14 DIAGNOSIS — F321 Major depressive disorder, single episode, moderate: Secondary | ICD-10-CM | POA: Diagnosis not present

## 2016-05-14 DIAGNOSIS — Z833 Family history of diabetes mellitus: Secondary | ICD-10-CM

## 2016-05-14 DIAGNOSIS — Z79899 Other long term (current) drug therapy: Secondary | ICD-10-CM

## 2016-05-14 MED ORDER — BUSPIRONE HCL 10 MG PO TABS: 10.0000 mg | ORAL_TABLET | Freq: Two times a day (BID) | ORAL | 2 refills | Status: DC
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
## 2016-05-29 ENCOUNTER — Ambulatory Visit: Payer: Self-pay | Admitting: Family Medicine

## 2016-06-01 ENCOUNTER — Other Ambulatory Visit: Payer: Self-pay | Admitting: Internal Medicine

## 2016-07-16 ENCOUNTER — Ambulatory Visit (HOSPITAL_COMMUNITY): Payer: Self-pay | Admitting: Psychiatry

## 2016-08-08 IMAGING — DX DG CHEST 2V
2 series · 2 of 2 positions shown · non-contrast
Comparison: None

CLINICAL DATA: Shortness of breath and abdominal pain.

EXAM:
CHEST  2 VIEW

[chest pa]
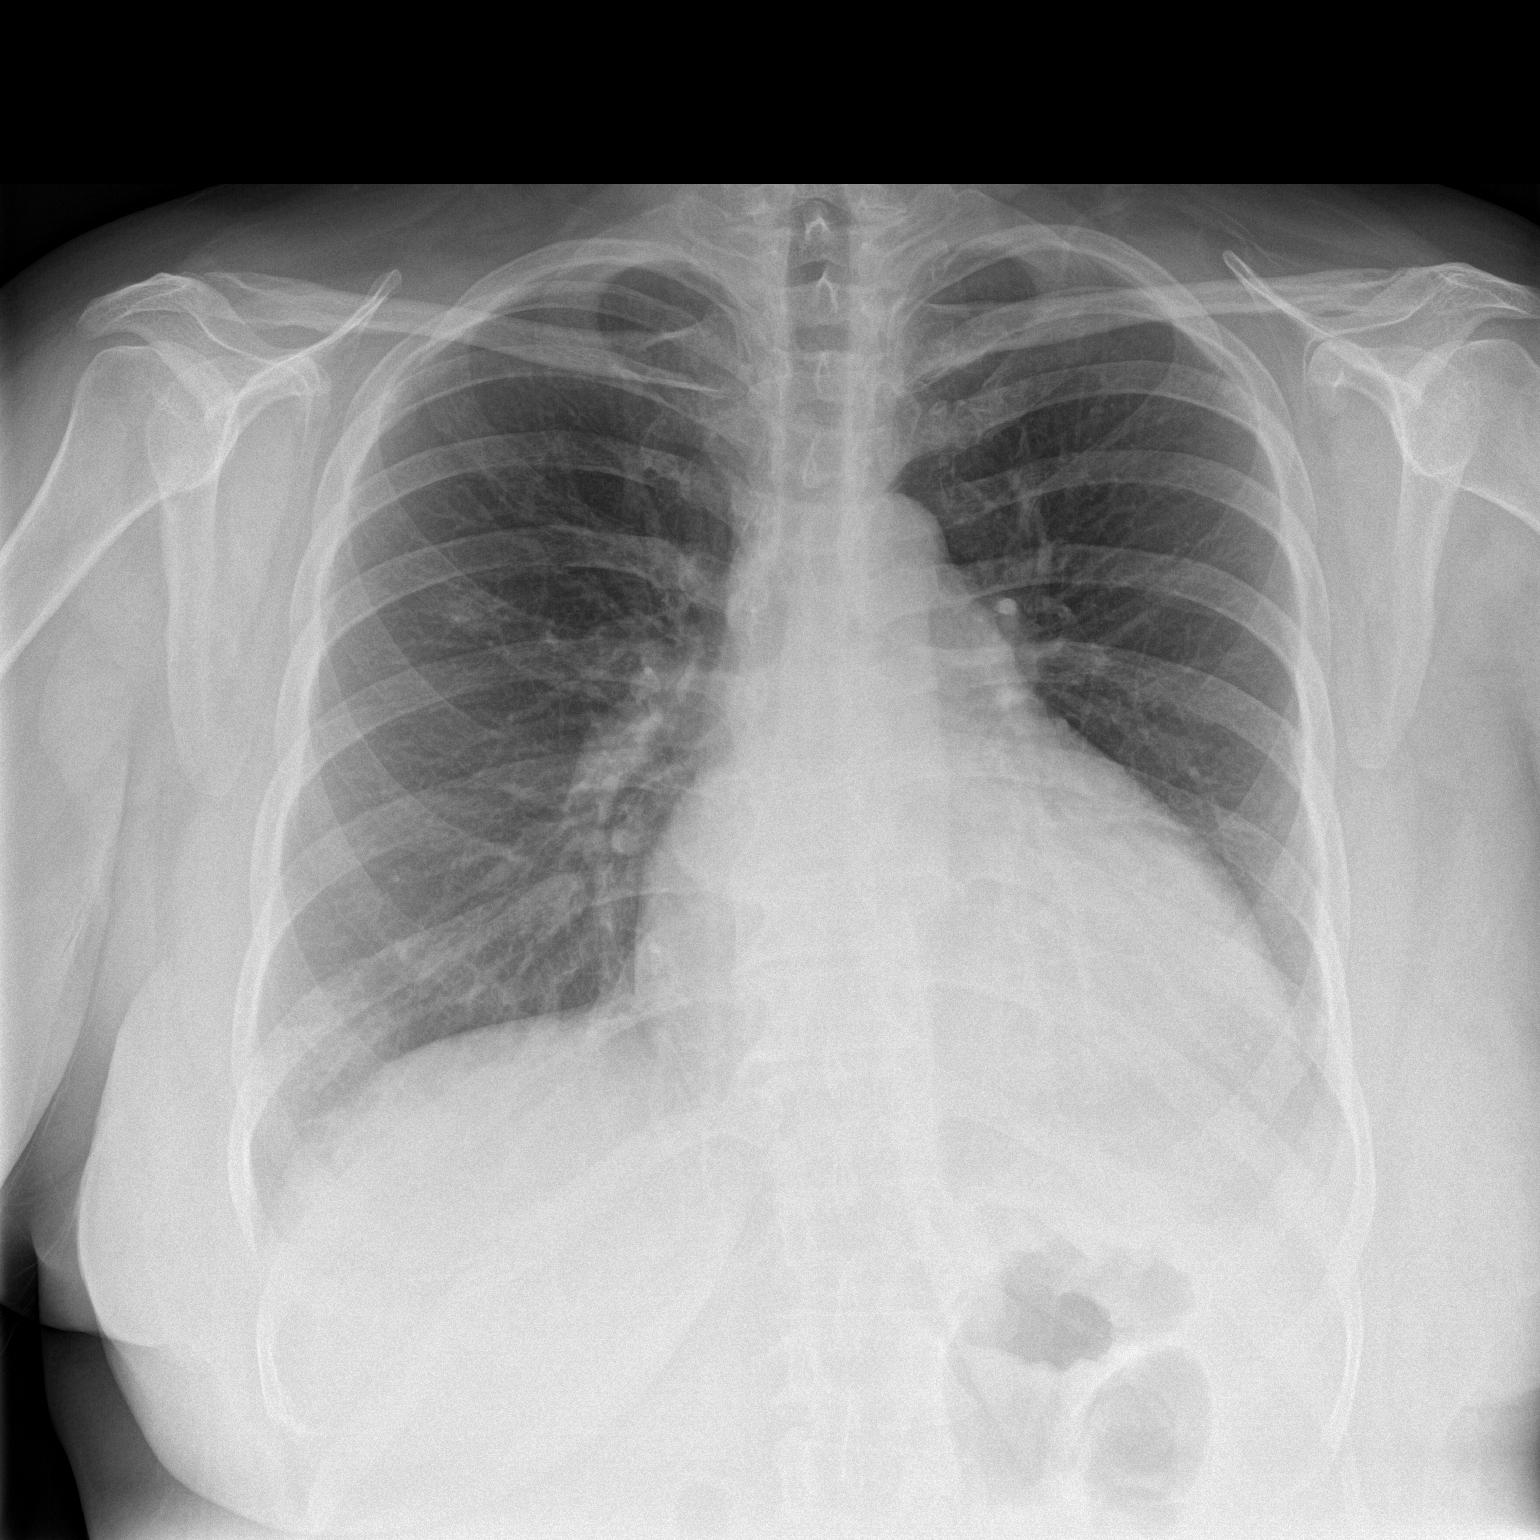

[chest lat]
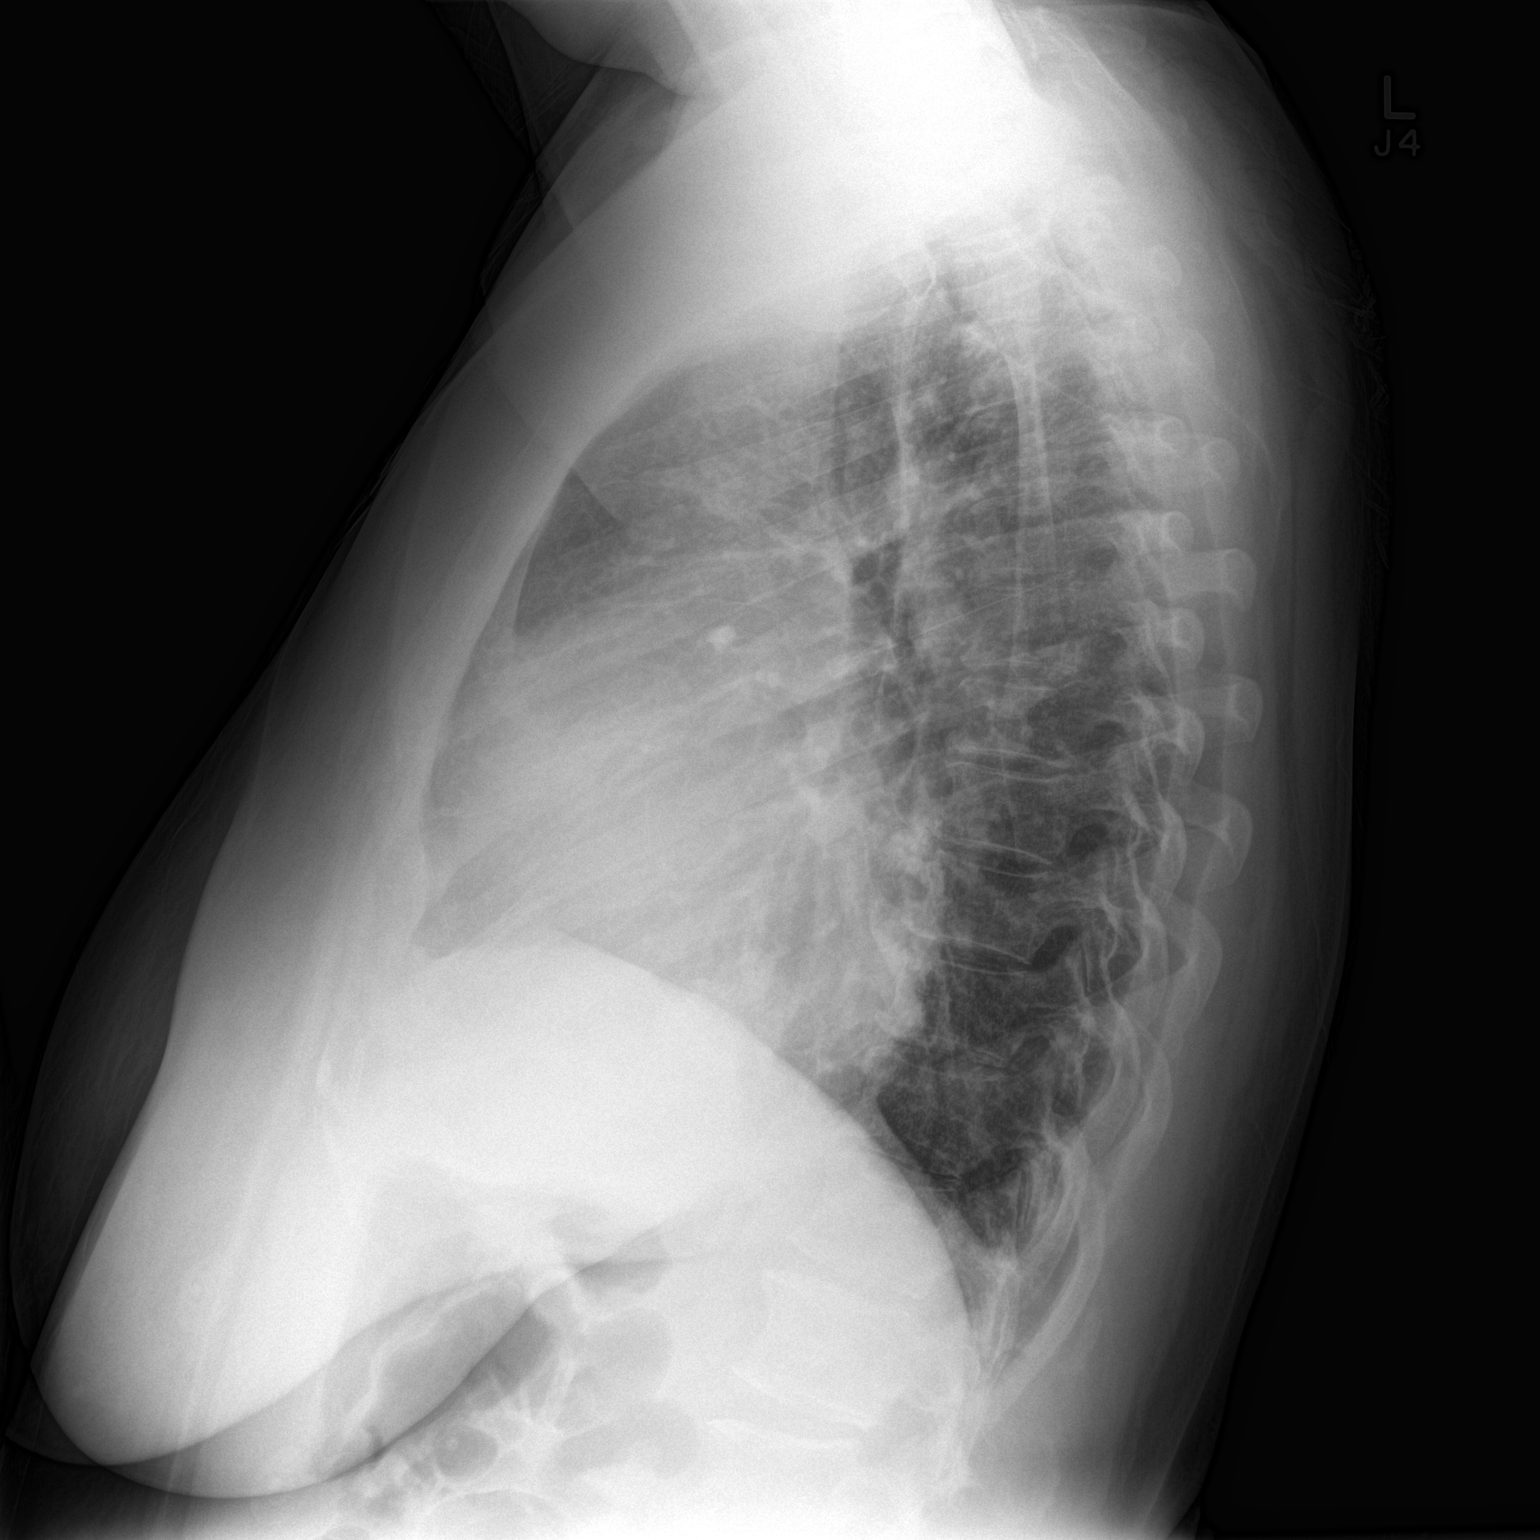

[2 of 2 positions shown; findings below may reference images not displayed]

FINDINGS: The heart size and mediastinal contours are within normal limits.
Both lungs are clear. The visualized skeletal structures are
unremarkable.
IMPRESSION: No active cardiopulmonary disease.

## 2016-08-29 ENCOUNTER — Encounter (HOSPITAL_COMMUNITY): Payer: Self-pay | Admitting: Psychiatry

## 2016-08-29 ENCOUNTER — Ambulatory Visit (INDEPENDENT_AMBULATORY_CARE_PROVIDER_SITE_OTHER): Payer: BLUE CROSS/BLUE SHIELD | Admitting: Psychiatry

## 2016-08-29 VITALS — BP 132/76 | HR 98 | Ht 66.0 in | Wt 257.0 lb

## 2016-08-29 DIAGNOSIS — Z79899 Other long term (current) drug therapy: Secondary | ICD-10-CM | POA: Diagnosis not present

## 2016-08-29 DIAGNOSIS — F99 Mental disorder, not otherwise specified: Secondary | ICD-10-CM

## 2016-08-29 DIAGNOSIS — Z882 Allergy status to sulfonamides status: Secondary | ICD-10-CM | POA: Diagnosis not present

## 2016-08-29 DIAGNOSIS — F321 Major depressive disorder, single episode, moderate: Secondary | ICD-10-CM | POA: Diagnosis not present

## 2016-08-29 DIAGNOSIS — F5105 Insomnia due to other mental disorder: Secondary | ICD-10-CM | POA: Diagnosis not present

## 2016-08-29 DIAGNOSIS — Z7982 Long term (current) use of aspirin: Secondary | ICD-10-CM

## 2016-08-29 DIAGNOSIS — F411 Generalized anxiety disorder: Secondary | ICD-10-CM | POA: Diagnosis not present

## 2016-08-29 MED ORDER — PAROXETINE HCL 20 MG PO TABS: 20.0000 mg | ORAL_TABLET | Freq: Every day | ORAL | 1 refills | Status: DC

## 2016-08-29 MED ORDER — TRAZODONE HCL 50 MG PO TABS
50.0000 mg | ORAL_TABLET | Freq: Every evening | ORAL | 1 refills | Status: DC | PRN
Start: 2016-08-29 — End: 2017-09-11

## 2016-08-29 MED ORDER — BUSPIRONE HCL 10 MG PO TABS: 10.0000 mg | ORAL_TABLET | Freq: Two times a day (BID) | ORAL | 2 refills | Status: AC

## 2016-08-29 NOTE — Progress Notes (Signed)
BH MD/PA/NP OP Progress Note  08/29/2016 9:50 AM Abigail Sharp  MRN:  [id number]  Chief Complaint:  Chief Complaint    Follow-up      HPI: Pt states she feels nervous a lot. She worries about "everything" and has racing thoughts and inability to relax and insomnia. Pt is only getting a few hours a night. Energy is low but she rarely naps during the day.  She is cleaning her hands 15 times a day because she is worried about getting sick or dirty.  Depression is unchanged. She feels down all the time. She reports crying spells, isolation and anhedonia. She does not go outside or do social activities. Pt spends her time watching tv or on the Internet. Denies SI/HI.   States meds are not working. Taking meds as prescribed and denies SE.      Visit Diagnosis:    ICD-9-CM ICD-10-CM   1. Insomnia due to other mental disorder 300.9 F51.05 traZODone (DESYREL) 50 MG tablet   327.02 F99   2. GAD (generalized anxiety disorder) 300.02 F41.1 busPIRone (BUSPAR) 10 MG tablet     PARoxetine (PAXIL) 20 MG tablet  3. Moderate single current episode of major depressive disorder (HCC) 296.22 F32.1 busPIRone (BUSPAR) 10 MG tablet     PARoxetine (PAXIL) 20 MG tablet    Past Psychiatric History: see H&P  Past Medical History:  Past Medical History:  Diagnosis Date  . Anxiety   . Chronic systolic heart failure (HCC)   . Depression   . HTN (hypertension)   . Microcytic anemia   . NICM (nonischemic cardiomyopathy) (HCC) 11/2014   EF is 25%  . Pre-diabetes     Past Surgical History:  Procedure Laterality Date  . CARDIAC CATHETERIZATION N/A 11/07/2014   Procedure: Right/Left Heart Cath and Coronary Angiography;  Surgeon: Runell Gess, MD;  Location: [facility] INVASIVE CV LAB;  Service: Cardiovascular;  Laterality: N/A;  . TONSILLECTOMY    . TUBAL LIGATION      Family Psychiatric History:  Family History  Problem Relation Age of Onset  . Diabetes Mother   . Hypertension Mother   . Diabetes  Father   . Heart disease Father   . Hypertension Father   . Diabetes Sister   . ADD / ADHD Son   . Alcohol abuse Neg Hx   . Anxiety disorder Neg Hx   . Bipolar disorder Neg Hx   . Depression Neg Hx   . Drug abuse Neg Hx     Social History:  Social History   Social History  . Marital status: Single    Spouse name: N/A  . Number of children: N/A  . Years of education: N/A   Social History Main Topics  . Smoking status: Never Smoker  . Smokeless tobacco: Never Used  . Alcohol use No  . Drug use: No  . Sexual activity: No   Other Topics Concern  . None   Social History Narrative   Social Hx:   Current living situation: Lives alone in apt in [place]: [place], [place] by parents.   Raised by parents. Parents let her do whatever she wanted to do while growing up. Both parents now deceased.    Siblings 1 brother and 2 sisters. Pt is number 3   Schooling- HS grad, faced a lot of bullied due to teen pregnancy at 32. Lived with parents while raising her oldest child but for the youngest she moved out temporarily. Her mom took over  raising him at the age of 36 mo.    Employed- last job in 2002. Worked at [employer] for 2.5 yrs but got fired   Married- never employed   Kids- 2 boys   Legal issues- jail x2 for not paying child support                Allergies:  Allergies  Allergen Reactions  . Sulfa Antibiotics Other (See Comments)    unknown    Metabolic Disorder Labs: Lab Results  Component Value Date   HGBA1C 5.7 (H) 11/05/2014   MPG 117 11/05/2014   No results found for: PROLACTIN Lab Results  Component Value Date   CHOL 189 08/23/2015   TRIG 290 (H) 08/23/2015   HDL 36 (L) 08/23/2015   CHOLHDL 5.3 (H) 08/23/2015   VLDL 58 (H) 08/23/2015   LDLCALC 95 08/23/2015   LDLCALC 79 11/05/2014     Current Medications: Current Outpatient Prescriptions  Medication Sig Dispense Refill  . aspirin 81 MG EC tablet Take 1 tablet (81 mg total) by mouth daily. 90 tablet  3  . busPIRone (BUSPAR) 10 MG tablet Take 1 tablet (10 mg total) by mouth 2 (two) times daily. 60 tablet 2  . carvedilol (COREG) 6.25 MG tablet Take 1 tablet (6.25 mg total) by mouth 2 (two) times daily with a meal. 60 tablet 11  . citalopram (CELEXA) 40 MG tablet Take 1 tablet (40 mg total) by mouth daily. 90 tablet 3  . famotidine (PEPCID) 20 MG tablet TAKE 1 TABLET(20 MG) BY MOUTH TWICE DAILY 60 tablet 2  . hydrochlorothiazide (HYDRODIURIL) 25 MG tablet Take 1 tablet (25 mg total) by mouth daily. 90 tablet 3  . loratadine (CLARITIN) 10 MG tablet Take 1 tablet (10 mg total) by mouth daily. 30 tablet 11  . simvastatin (ZOCOR) 20 MG tablet Take 1 tablet (20 mg total) by mouth at bedtime. 30 tablet 3  . spironolactone (ALDACTONE) 25 MG tablet Take 1 tablet (25 mg total) by mouth 2 (two) times daily. 60 tablet 5   No current facility-administered medications for this visit.      Musculoskeletal: Strength & Muscle Tone: within normal limits Gait & Station: normal Patient leans: N/A  Psychiatric Specialty Exam: Review of Systems  HENT: Positive for congestion. Negative for ear discharge, nosebleeds, sinus pain and sore throat.   Musculoskeletal: Negative for back pain, falls, joint pain and neck pain.  Neurological: Positive for headaches. Negative for dizziness, tremors, sensory change, seizures and loss of consciousness.  Psychiatric/Behavioral: Positive for depression. Negative for hallucinations, substance abuse and suicidal ideas. The patient is nervous/anxious and has insomnia.     Blood pressure 132/76, pulse 98, height 5\' 6"  (1.676 m), weight 257 lb (116.6 kg).Body mass index is 41.48 kg/m.  General Appearance: Casual  Eye Contact:  Good  Speech:  Clear and Coherent and Normal Rate  Volume:  Normal  Mood:  Anxious and Depressed  Affect:  Congruent  Thought Process:  Goal Directed and Descriptions of Associations: Intact  Orientation:  Full (Time, Place, and Person)  Thought  Content: WDL   Suicidal Thoughts:  No  Homicidal Thoughts:  No  Memory:  Immediate;   Good Recent;   Good Remote;   Good  Judgement:  Fair  Insight:  Fair  Psychomotor Activity:  Normal  Concentration:  Concentration: Good and Attention Span: Good  Recall:  Good  Fund of Knowledge: Good  Language: Good  Akathisia:  No  Handed:  Right  AIMS (if indicated):  n/a  Assets:  Communication Skills Desire for Improvement  ADL's:  Intact  Cognition: WNL  Sleep:  poor     Treatment Plan Summary:Medication management and Plan see below   Assessment: GAD, MDD-single moderate; Insomnia   Medication management with supportive therapy. Risks/benefits and SE of the medication discussed. Pt verbalized understanding and verbal consent obtained for treatment.  Affirm with the patient that the medications are taken as ordered. Patient expressed understanding of how their medications were to be used.   The risk of un-intended pregnancy is low based on the fact that pt reports she is perimenopause.  Pt is aware that these meds carry a teratogenic risk. Pt will discuss plan of action if she does or plans to become pregnant in the future.   Meds: d/c Celexa as it is ineffective Continue Buspar 10mg  po BID for ongoing anxiety Start trial of Paxil 20mg  po qHS for ongoing depression and anxiety Start trial of Trazodone 50mg  po qHS prn insomnia   Labs: none   Therapy: brief supportive therapy provided. Discussed psychosocial stressors in detail.   Encouraged pt to develop daily routine and work on daily goal setting as a way to improve mood symptoms.   Consultations: declined therapy  Pt denies SI and is at an acute low risk for suicide. Patient told to call clinic if any problems occur. Patient advised to go to ER if they should develop SI/HI, side effects, or if symptoms worsen. Has crisis numbers to call if needed. Pt verbalized understanding.  F/up in 2 months or sooner if  needed    Oletta Darter, MD 08/29/2016, 9:50 AM

## 2016-09-18 ENCOUNTER — Other Ambulatory Visit: Payer: Self-pay | Admitting: Internal Medicine

## 2016-09-18 DIAGNOSIS — Z1231 Encounter for screening mammogram for malignant neoplasm of breast: Secondary | ICD-10-CM

## 2016-09-23 ENCOUNTER — Ambulatory Visit: Payer: Self-pay | Admitting: Internal Medicine

## 2016-10-14 ENCOUNTER — Ambulatory Visit: Payer: Self-pay

## 2016-10-17 ENCOUNTER — Encounter: Payer: Self-pay | Admitting: Internal Medicine

## 2016-10-23 ENCOUNTER — Ambulatory Visit: Payer: Self-pay | Admitting: Interventional Cardiology

## 2016-10-31 ENCOUNTER — Ambulatory Visit (HOSPITAL_COMMUNITY): Payer: Self-pay | Admitting: Psychiatry

## 2016-12-05 NOTE — Progress Notes (Signed)
Cardiology Office Note    Date:  12/06/2016   ID:  Abigail Sharp, DOB Aug 10, 1965, MRN [id number]  PCP:  Pete Glatter, MD  Cardiologist: Lesleigh Noe, MD   Chief Complaint  Patient presents with  . Congestive Heart Failure    History of Present Illness:  Abigail Sharp is a 50 y.o. female  who presents for f/u of systolic heart failure, hypertension, pre-diabetes, LVEF 25% improved to 55% on medical therapy.  Abigail Sharp is somewhat tearful/sad appearing. We began discussing her condition, she stated inability to take her medications as prescribed because she has no funds to pay for them. She is still taking spironolactone and carvedilol as prescribed along with an aspirin daily. The other medications which include psychotropic agents BuSpar, Paxil, and trazodone are no longer being used. She also is not taking hydrochlorothiazide, Pepcid, loratadine, or Zocor.  She denies chest pain. She has exertional intolerance. She does not exercise. She has difficulty sleeping.  Past Medical History:  Diagnosis Date  . Anxiety   . Chronic systolic heart failure (HCC)   . Depression   . HTN (hypertension)   . Microcytic anemia   . NICM (nonischemic cardiomyopathy) (HCC) 11/2014   EF is 25% improved to 55% in 2015.  . Pre-diabetes     Past Surgical History:  Procedure Laterality Date  . CARDIAC CATHETERIZATION N/A 11/07/2014   Procedure: Right/Left Heart Cath and Coronary Angiography;  Surgeon: Runell Gess, MD;  Location: [facility] INVASIVE CV LAB;  Service: Cardiovascular;  Laterality: N/A;  . TONSILLECTOMY    . TUBAL LIGATION      Current Medications: Outpatient Medications Prior to Visit  Medication Sig Dispense Refill  . aspirin 81 MG EC tablet Take 1 tablet (81 mg total) by mouth daily. 90 tablet 3  . busPIRone (BUSPAR) 10 MG tablet Take 1 tablet (10 mg total) by mouth 2 (two) times daily. 60 tablet 2  . carvedilol (COREG) 6.25 MG tablet Take 1 tablet (6.25 mg total) by mouth 2  (two) times daily with a meal. 60 tablet 11  . famotidine (PEPCID) 20 MG tablet TAKE 1 TABLET(20 MG) BY MOUTH TWICE DAILY 60 tablet 2  . hydrochlorothiazide (HYDRODIURIL) 25 MG tablet Take 1 tablet (25 mg total) by mouth daily. 90 tablet 3  . hydrochlorothiazide (MICROZIDE) 12.5 MG capsule TAKE ONE CAPSULE BY MOUTH EVERY DAY 30 capsule 0  . loratadine (CLARITIN) 10 MG tablet Take 1 tablet (10 mg total) by mouth daily. 30 tablet 11  . PARoxetine (PAXIL) 20 MG tablet Take 1 tablet (20 mg total) by mouth daily. 30 tablet 1  . simvastatin (ZOCOR) 20 MG tablet TAKE 1 TABLET(20 MG) BY MOUTH AT BEDTIME 30 tablet 0  . spironolactone (ALDACTONE) 25 MG tablet Take 1 tablet (25 mg total) by mouth 2 (two) times daily. 60 tablet 5  . traZODone (DESYREL) 50 MG tablet Take 1 tablet (50 mg total) by mouth at bedtime as needed for sleep. 30 tablet 1   No facility-administered medications prior to visit.      Allergies:   Sulfa antibiotics   Social History   Social History  . Marital status: Single    Spouse name: N/A  . Number of children: N/A  . Years of education: N/A   Social History Main Topics  . Smoking status: Never Smoker  . Smokeless tobacco: Never Used  . Alcohol use No  . Drug use: No  . Sexual activity: No   Other Topics Concern  .  None   Social History Narrative   Social Hx:   Current living situation: Lives alone in apt in [place]: [place], [place] by parents.   Raised by parents. Parents let her do whatever she wanted to do while growing up. Both parents now deceased.    Siblings 1 brother and 2 sisters. Pt is number 3   Schooling- HS grad, faced a lot of bullied due to teen pregnancy at 58. Lived with parents while raising her oldest child but for the youngest she moved out temporarily. Her mom took over raising him at the age of 80 mo.    Employed- last job in 2002. 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   ROS:   Please see the history of present illness.    Ankle swelling and feelings of loneliness.  All other systems reviewed and are negative.   PHYSICAL EXAM:   VS:  BP 104/72 (BP Location: Right Arm)   Pulse 97   Ht 5\' 7"  (1.702 m)   Wt 270 lb (122.5 kg)   BMI 42.29 kg/m    GEN: Well nourished, well developed, in no acute distress. Marked obesity. Holder.  HEENT: normal  Neck: no JVD, carotid bruits, or masses Cardiac: RRR; no murmurs, rubs, or gallops. Bilateral trace ankle edema. Respiratory:  clear to auscultation bilaterally, normal work of breathing GI: soft, nontender, nondistended, + BS MS: no deformity or atrophy  Skin: warm and dry, no rash Neuro:  Alert and Oriented x 3, Strength and sensation are intact Psych: euthymic mood, full affect  Wt Readings from Last 3 Encounters:  12/06/16 270 lb (122.5 kg)  04/17/16 229 lb (103.9 kg)  03/05/16 222 lb (100.7 kg)      Studies/Labs Reviewed:   EKG:  EKG  Not repeated.  Recent Labs: 03/05/2016: BUN 29; Creat 1.11; Potassium 5.1; Sodium 136   Lipid Panel    Component Value Date/Time   CHOL 189 08/23/2015 1028   TRIG 290 (H) 08/23/2015 1028   HDL 36 (L) 08/23/2015 1028   CHOLHDL 5.3 (H) 08/23/2015 1028   VLDL 58 (H) 08/23/2015 1028   LDLCALC 95 08/23/2015 1028    Additional studies/ records that were reviewed today include:  None    ASSESSMENT:    1. Chronic diastolic heart failure   2. Essential hypertension   3. Non-ischemic cardiomyopathy (HCC)   4. Morbid obesity (HCC)      PLAN:  In order of problems listed above:  1. Chronic diastolic heart failure is well compensated without evidence of volume overload. Current therapy is spironolactone and carvedilol.  Unable to tolerate ARB therapy/ACE therapy. Unable to reliably afford her medications. Encouraged her to remain on therapy as listed. Basic metabolic panel will be obtained today. 2. Blood pressure is relatively low. 3. When last evaluated in a serial fashion EF had increased to 55%. She has no coronary disease. 4. Still a major issue.  Low salt diet, exercise, social services evaluation is recommended via Medicaid services.    Medication Adjustments/Labs and Tests Ordered: Current medicines are  reviewed at length with the patient today.  Concerns regarding medicines are outlined above.  Medication changes, Labs and Tests ordered today are listed in the Patient Instructions below. Patient Instructions  Medication Instructions:  None  Labwork: BMET today  Testing/Procedures: None  Follow-Up: Your physician wants you to follow-up in: 1 year with Dr. [name].  You will receive a reminder letter in the mail two months in advance. If you don't receive a letter, please call our office to schedule the follow-up appointment.   Any Other Special Instructions Will Be Listed Below (If Applicable).     If you need a refill on your cardiac medications before your next appointment, please call your pharmacy.      Signed, Lesleigh Noe, MD  12/06/2016 12:56 PM    [facility] [address] [place], [place], [place]  [address] Phone: [phone]; Fax: [phone]

## 2016-12-06 ENCOUNTER — Encounter: Payer: Self-pay | Admitting: Interventional Cardiology

## 2016-12-06 ENCOUNTER — Ambulatory Visit (INDEPENDENT_AMBULATORY_CARE_PROVIDER_SITE_OTHER): Payer: BLUE CROSS/BLUE SHIELD | Admitting: Interventional Cardiology

## 2016-12-06 VITALS — BP 104/72 | HR 97 | Ht 67.0 in | Wt 270.0 lb

## 2016-12-06 DIAGNOSIS — I428 Other cardiomyopathies: Secondary | ICD-10-CM

## 2016-12-06 DIAGNOSIS — I10 Essential (primary) hypertension: Secondary | ICD-10-CM

## 2016-12-06 DIAGNOSIS — I5022 Chronic systolic (congestive) heart failure: Secondary | ICD-10-CM | POA: Diagnosis not present

## 2016-12-06 LAB — BASIC METABOLIC PANEL
BUN / CREAT RATIO: 25 — AB (ref 9–23)
BUN: 30 mg/dL — AB (ref 6–24)
CHLORIDE: 102 mmol/L (ref 96–106)
CO2: 22 mmol/L (ref 20–29)
Calcium: 9.7 mg/dL (ref 8.7–10.2)
Creatinine, Ser: 1.2 mg/dL — ABNORMAL HIGH (ref 0.57–1.00)
GFR calc non Af Amer: 53 mL/min/{1.73_m2} — ABNORMAL LOW (ref >59)
GFR, EST AFRICAN AMERICAN: 61 mL/min/{1.73_m2} (ref >59)
Glucose: 78 mg/dL (ref 65–99)
Potassium: 4.8 mmol/L (ref 3.5–5.2)
Sodium: 140 mmol/L (ref 134–144)

## 2016-12-06 NOTE — Patient Instructions (Signed)
Medication Instructions:  None  Labwork: BMET today  Testing/Procedures: None  Follow-Up: Your physician wants you to follow-up in: 1 year with Dr. Smith. You will receive a reminder letter in the mail two months in advance. If you don't receive a letter, please call our office to schedule the follow-up appointment.    Any Other Special Instructions Will Be Listed Below (If Applicable).     If you need a refill on your cardiac medications before your next appointment, please call your pharmacy.   

## 2016-12-09 ENCOUNTER — Telehealth: Payer: Self-pay | Admitting: *Deleted

## 2016-12-09 MED ORDER — SPIRONOLACTONE 25 MG PO TABS: 25.0000 mg | ORAL_TABLET | Freq: Once | ORAL | 3 refills | Status: DC

## 2016-12-09 NOTE — Telephone Encounter (Signed)
Spoke with Abigail Sharp and went over lab results and recommendations per Dr. Smith.   Abigail Sharp verbalized understanding and was in agreement with this plan.  

## 2016-12-09 NOTE — Telephone Encounter (Signed)
-----   Message from Lyn Records, MD sent at 12/06/2016  6:59 PM EDT ----- Let the patient know labs are okay. Same therapy except decrease spironolactone to 1 tablet (25 mg) daily. A copy will be sent to Pete Glatter, MD

## 2016-12-16 ENCOUNTER — Ambulatory Visit: Payer: Self-pay

## 2016-12-19 ENCOUNTER — Ambulatory Visit (HOSPITAL_COMMUNITY): Payer: Self-pay | Admitting: Psychiatry

## 2017-02-05 ENCOUNTER — Telehealth (HOSPITAL_COMMUNITY): Payer: Self-pay

## 2017-02-05 NOTE — Telephone Encounter (Signed)
Patient is calling because she needs medication refills and has to cancel her appointment on 9/6. I had a lengthy conversation with patient about missed appointments. She is unable to come on Thursdays due to transportation, she would like to come on Monday and would like to know if she can change providers. Please review and advise, thank you

## 2017-02-06 ENCOUNTER — Ambulatory Visit (HOSPITAL_COMMUNITY): Payer: BLUE CROSS/BLUE SHIELD | Admitting: Psychiatry

## 2017-02-06 NOTE — Telephone Encounter (Signed)
If scheduling is an issue then it is ok for her to change providers

## 2017-02-07 ENCOUNTER — Telehealth (HOSPITAL_COMMUNITY): Payer: Self-pay | Admitting: Psychiatry

## 2017-02-07 NOTE — Telephone Encounter (Signed)
I called the patient and left a voicemail letting her know. I gave her info to the front desk so they could get her scheduled.

## 2017-04-22 ENCOUNTER — Telehealth: Payer: Self-pay | Admitting: Interventional Cardiology

## 2017-04-22 DIAGNOSIS — R0602 Shortness of breath: Secondary | ICD-10-CM

## 2017-04-22 DIAGNOSIS — I5022 Chronic systolic (congestive) heart failure: Secondary | ICD-10-CM

## 2017-04-22 NOTE — Telephone Encounter (Signed)
Spoke with pt and went over recommendations per Dr. [name].  Pt having car trouble at this time but plans to have it looked at next week.  Pt will call once she is able to get to the office for labs to make an appt.  Pt in agreement to continue Spironolactone 25mg  QD for now.  Pt appreciative for call.

## 2017-04-22 NOTE — Telephone Encounter (Signed)
Left message to call back  

## 2017-04-22 NOTE — Telephone Encounter (Signed)
She should remain on spironolactone 25 mg daily.  Because she is having lower extremity swelling/dyspnea we need a BNP.  We also need a baseline Bmet on the current medical regimen since she has discontinued HCTZ.  I do not believe she needs to be placed back on HCTZ.

## 2017-04-22 NOTE — Telephone Encounter (Signed)
New message    Patient wants you to call her about the medication she is on , she wants to go back to old medication (does not know the name)   She only wants to take one medication, and the one her pcp has her on is too expensive (does not know name of this one)

## 2017-04-22 NOTE — Telephone Encounter (Signed)
Pt states she previously took Spironolactone 25mg  BID.  States had labs at last OV and Cleda Daub was decreased to 25mg  QD since she was also on HCTZ.  Pt states that she stopped taking HCTZ a few months ago because she could not afford to keep buying 2 diuretics.  HCTZ is more expensive for pt and she prefers to just be on 1 diuretic, instead of 2.  Pt currently only taking Spironolactone 25mg  QD but would like to go back to Spironolactone 25mg  BID.  States she does have swelling if she is up and moving around or has her feet dangling for a long time.  Pt does get up and move around a lot.  States swelling resolves with elevation.  Pt does experience some SOB when exerting.  States gets winded just with sweeping.  Advised I will send message to Dr. [name] for review and advisement.

## 2017-05-08 ENCOUNTER — Ambulatory Visit (HOSPITAL_COMMUNITY): Payer: Self-pay | Admitting: Psychiatry

## 2017-06-19 ENCOUNTER — Ambulatory Visit (HOSPITAL_COMMUNITY): Payer: Self-pay | Admitting: Psychiatry

## 2017-06-26 ENCOUNTER — Ambulatory Visit (HOSPITAL_COMMUNITY): Payer: Self-pay | Admitting: Psychiatry

## 2017-06-30 ENCOUNTER — Telehealth: Payer: Self-pay | Admitting: *Deleted

## 2017-06-30 ENCOUNTER — Other Ambulatory Visit: Payer: Self-pay | Admitting: *Deleted

## 2017-06-30 MED ORDER — CARVEDILOL 6.25 MG PO TABS: 6.2500 mg | ORAL_TABLET | Freq: Two times a day (BID) | ORAL | 4 refills | Status: DC

## 2017-06-30 NOTE — Telephone Encounter (Signed)
Abigail Sharp is returning your call .  Thanks

## 2017-06-30 NOTE — Telephone Encounter (Signed)
Pt states she has been having BLE so bad that her legs are extremely tight and itch.  States they have been like this for several weeks now.  Pt states swelling improves with elevation.  Takes all of her diuretics in the AM and is up and down all night urinating.  Pt's Spironolactone was increased in November and she never came for f/u labs.  Advised pt she needed to come in and be seen and have labs drawn so we can properly take care of her and not damage her kidneys.  Pt declined at first stating she had no way to get here.  Advised pt it was very important to get to office so we can properly take care of her.  Pt agreed to come in Thursday and see Nada Boozer, NP.  Advised pt of importance of keeping this appt.  Pt will contact the office if she is unable to come.  Will route to Dr. [name] to make him aware.

## 2017-06-30 NOTE — Telephone Encounter (Signed)
Patient called and stated that she feels that her dose of spironolactone needs to be adjusted. She c/o lower extremity swelling, she states that her legs are so tight that they are itching and she has scratched them to the point that they have been bleeding. She would like a call back at [phone]. Thanks, MI

## 2017-06-30 NOTE — Telephone Encounter (Signed)
Agree with instruction 

## 2017-06-30 NOTE — Telephone Encounter (Signed)
Left message to call back  

## 2017-07-03 ENCOUNTER — Ambulatory Visit: Payer: Self-pay | Admitting: Cardiology

## 2017-07-19 ENCOUNTER — Ambulatory Visit (HOSPITAL_COMMUNITY): Payer: Self-pay | Admitting: Psychiatry
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
## 2017-09-11 ENCOUNTER — Other Ambulatory Visit: Payer: Self-pay | Admitting: *Deleted

## 2017-09-11 ENCOUNTER — Ambulatory Visit (INDEPENDENT_AMBULATORY_CARE_PROVIDER_SITE_OTHER): Payer: BLUE CROSS/BLUE SHIELD | Admitting: Psychiatry

## 2017-09-11 ENCOUNTER — Other Ambulatory Visit: Payer: BLUE CROSS/BLUE SHIELD | Admitting: *Deleted

## 2017-09-11 ENCOUNTER — Encounter (HOSPITAL_COMMUNITY): Payer: Self-pay | Admitting: Psychiatry

## 2017-09-11 VITALS — BP 128/88 | HR 94 | Ht 65.0 in | Wt 281.0 lb

## 2017-09-11 DIAGNOSIS — G4701 Insomnia due to medical condition: Secondary | ICD-10-CM

## 2017-09-11 DIAGNOSIS — R0602 Shortness of breath: Secondary | ICD-10-CM

## 2017-09-11 DIAGNOSIS — M7989 Other specified soft tissue disorders: Secondary | ICD-10-CM

## 2017-09-11 DIAGNOSIS — F411 Generalized anxiety disorder: Secondary | ICD-10-CM

## 2017-09-11 DIAGNOSIS — F332 Major depressive disorder, recurrent severe without psychotic features: Secondary | ICD-10-CM | POA: Diagnosis not present

## 2017-09-11 MED ORDER — BUSPIRONE HCL 7.5 MG PO TABS: 7.5000 mg | ORAL_TABLET | Freq: Two times a day (BID) | ORAL | 1 refills | Status: DC

## 2017-09-11 MED ORDER — SERTRALINE HCL 50 MG PO TABS: 50.0000 mg | ORAL_TABLET | Freq: Every day | ORAL | 1 refills | Status: DC
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
## 2017-09-12 LAB — BASIC METABOLIC PANEL
BUN / CREAT RATIO: 11 (ref 9–23)
BUN: 12 mg/dL (ref 6–24)
CHLORIDE: 99 mmol/L (ref 96–106)
CO2: 26 mmol/L (ref 20–29)
Calcium: 9.5 mg/dL (ref 8.7–10.2)
Creatinine, Ser: 1.08 mg/dL — ABNORMAL HIGH (ref 0.57–1.00)
GFR calc Af Amer: 69 mL/min/{1.73_m2} (ref >59)
GFR calc non Af Amer: 60 mL/min/{1.73_m2} (ref >59)
GLUCOSE: 78 mg/dL (ref 65–99)
POTASSIUM: 4.8 mmol/L (ref 3.5–5.2)
SODIUM: 138 mmol/L (ref 134–144)

## 2017-09-12 LAB — PRO B NATRIURETIC PEPTIDE: NT-PRO BNP: 108 pg/mL (ref 0–249)

## 2017-10-16 ENCOUNTER — Ambulatory Visit (HOSPITAL_COMMUNITY): Payer: BLUE CROSS/BLUE SHIELD | Admitting: Psychiatry
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
## 2017-10-30 ENCOUNTER — Ambulatory Visit (INDEPENDENT_AMBULATORY_CARE_PROVIDER_SITE_OTHER): Payer: BLUE CROSS/BLUE SHIELD | Admitting: Psychiatry

## 2017-10-30 ENCOUNTER — Encounter (HOSPITAL_COMMUNITY): Payer: Self-pay | Admitting: Psychiatry

## 2017-10-30 VITALS — BP 126/81 | HR 94 | Ht 65.0 in | Wt 277.4 lb

## 2017-10-30 DIAGNOSIS — F411 Generalized anxiety disorder: Secondary | ICD-10-CM

## 2017-10-30 DIAGNOSIS — F422 Mixed obsessional thoughts and acts: Secondary | ICD-10-CM

## 2017-10-30 DIAGNOSIS — G4701 Insomnia due to medical condition: Secondary | ICD-10-CM | POA: Diagnosis not present

## 2017-10-30 DIAGNOSIS — F332 Major depressive disorder, recurrent severe without psychotic features: Secondary | ICD-10-CM | POA: Diagnosis not present

## 2017-10-30 MED ORDER — SERTRALINE HCL 100 MG PO TABS: 100.0000 mg | ORAL_TABLET | Freq: Every day | ORAL | 1 refills | Status: DC

## 2017-10-30 MED ORDER — GABAPENTIN 100 MG PO CAPS: 100.0000 mg | ORAL_CAPSULE | Freq: Two times a day (BID) | ORAL | 1 refills | Status: DC

## 2017-10-30 MED ORDER — BUSPIRONE HCL 10 MG PO TABS: 10.0000 mg | ORAL_TABLET | Freq: Two times a day (BID) | ORAL | 1 refills | Status: DC
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
## 2018-01-01 ENCOUNTER — Ambulatory Visit (HOSPITAL_COMMUNITY): Payer: BLUE CROSS/BLUE SHIELD | Admitting: Psychiatry

## 2018-01-22 ENCOUNTER — Encounter (HOSPITAL_COMMUNITY): Payer: Self-pay | Admitting: Psychiatry

## 2018-01-22 ENCOUNTER — Ambulatory Visit (INDEPENDENT_AMBULATORY_CARE_PROVIDER_SITE_OTHER): Payer: BLUE CROSS/BLUE SHIELD | Admitting: Psychiatry

## 2018-01-22 VITALS — BP 143/88 | HR 94 | Ht 65.0 in | Wt 281.0 lb

## 2018-01-22 DIAGNOSIS — F332 Major depressive disorder, recurrent severe without psychotic features: Secondary | ICD-10-CM

## 2018-01-22 DIAGNOSIS — F411 Generalized anxiety disorder: Secondary | ICD-10-CM | POA: Diagnosis not present

## 2018-01-22 DIAGNOSIS — F422 Mixed obsessional thoughts and acts: Secondary | ICD-10-CM | POA: Diagnosis not present

## 2018-01-22 MED ORDER — BUSPIRONE HCL 10 MG PO TABS: 10.0000 mg | ORAL_TABLET | Freq: Two times a day (BID) | ORAL | 1 refills | Status: DC

## 2018-01-22 MED ORDER — SERTRALINE HCL 100 MG PO TABS: 100.0000 mg | ORAL_TABLET | Freq: Every day | ORAL | 1 refills | Status: DC

## 2018-01-22 MED ORDER — GABAPENTIN 100 MG PO CAPS: 200.0000 mg | ORAL_CAPSULE | Freq: Two times a day (BID) | ORAL | 1 refills | Status: DC
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
## 2018-02-18 NOTE — Progress Notes (Signed)
Cardiology Office Note:    Date:  02/19/2018   ID:  Abigail Sharp, DOB August 04, 1965, MRN [id number]  PCP:  Pete Glatter, MD (Inactive)  Cardiologist:  No primary care provider on file.   Referring MD: No ref. provider found   Chief Complaint  Patient presents with  . Congestive Heart Failure    History of Present Illness:    Abigail Sharp is a 52 y.o. female with a hx of systolic heart failure, hypertension, pre-diabetes, LVEF 25% improved to 55% on medical therapy.    She is doing okay.  Initially she states that she is taking her medication but is gaining weight and short of breath because of that.  Also complained of left leg and back pain.  After further questioning concerning her heart rate she states that she cannot afford to take her medication and is on aspirin only.  She denies orthopnea.  She has not had lower extremity swelling.  She has gained significant weight since her last office visit increasing from 220 pounds in 217   Past Medical History:  Diagnosis Date  . Anxiety   . Chronic systolic heart failure (HCC)   . Depression   . HTN (hypertension)   . Microcytic anemia   . NICM (nonischemic cardiomyopathy) (HCC) 11/2014   EF is 25% improved to 55% in 2015.  . Pre-diabetes     Past Surgical History:  Procedure Laterality Date  . CARDIAC CATHETERIZATION N/A 11/07/2014   Procedure: Right/Left Heart Cath and Coronary Angiography;  Surgeon: Runell Gess, MD;  Location: [facility] INVASIVE CV LAB;  Service: Cardiovascular;  Laterality: N/A;  . TONSILLECTOMY    . TUBAL LIGATION      Current Medications: Current Meds  Medication Sig  . aspirin 81 MG EC tablet Take 1 tablet (81 mg total) by mouth daily.  . busPIRone (BUSPAR) 10 MG tablet Take 1 tablet (10 mg total) by mouth 2 (two) times daily.  . carvedilol (COREG) 6.25 MG tablet Take 1 tablet (6.25 mg total) by mouth 2 (two) times daily with a meal.  . gabapentin (NEURONTIN) 100 MG capsule Take 2 capsules (200 mg  total) by mouth 2 (two) times daily.  . sertraline (ZOLOFT) 100 MG tablet Take 1 tablet (100 mg total) by mouth daily.  Marland Kitchen spironolactone (ALDACTONE) 25 MG tablet Take 25 mg by mouth daily.     Allergies:   Sulfa antibiotics   Social History   Socioeconomic History  . Marital status: Single    Spouse name: Not on file  . Number of children: Not on file  . Years of education: Not on file  . Highest education level: Not on file  Occupational History  . Not on file  Social Needs  . Financial resource strain: Not on file  . Food insecurity:    Worry: Not on file    Inability: Not on file  . Transportation needs:    Medical: Not on file    Non-medical: Not on file  Tobacco Use  . Smoking status: Never Smoker  . Smokeless tobacco: Never Used  Substance and Sexual Activity  . Alcohol use: No    Alcohol/week: 0.0 standard drinks  . Drug use: No  . Sexual activity: Never  Lifestyle  . Physical activity:    Days per week: Not on file    Minutes per session: Not on file  . Stress: Not on file  Relationships  . Social connections:    Talks on phone: Not on  file    Gets together: Not on file    Attends religious service: Not on file    Active member of club or organization: Not on file    Attends meetings of clubs or organizations: Not on file    Relationship status: Not on file  Other Topics Concern  . Not on file  Social History Narrative   Social Hx:   Current living situation: Lives alone in apt in [place]: [place], [place] by parents.   Raised by parents. Parents let her do whatever she wanted to do while growing up. Both parents now deceased.    Siblings 1 brother and 2 sisters. Pt is number 3   Schooling- HS grad, faced a lot of bullied due to teen pregnancy at 28. Lived with parents while raising her oldest child but for the youngest she moved out temporarily. Her mom took over raising him at the age of 47 mo.    Employed- last job in 2002. 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 There is no history of Alcohol abuse, Anxiety disorder, Bipolar disorder, Depression, or Drug abuse.  ROS:   Please see the history of present illness.    Back and leg pain.  Angry because she cannot afford her medications.  Angry because we left a voicemail about her lab work rather than talking her in person.  Wants to know if she can get disability.  All other systems reviewed and are negative.  EKGs/Labs/Other Studies Reviewed:    The following studies were reviewed today: No new data  EKG:  EKG is  ordered today.  The ekg ordered today demonstrates sinus tachycardia 106 bpm.  No ST-T wave abnormality other than T wave flattening.  Recent Labs: 09/11/2017: BUN 12; Creatinine, Ser 1.08; NT-Pro BNP 108; Potassium 4.8; Sodium 138  Recent Lipid Panel    Component Value Date/Time   CHOL 189 08/23/2015 1028   TRIG 290 (H) 08/23/2015 1028   HDL 36 (L) 08/23/2015 1028   CHOLHDL 5.3 (H) 08/23/2015 1028   VLDL 58 (H) 08/23/2015 1028   LDLCALC 95 08/23/2015 1028    Physical Exam:    VS:  BP (!) 152/98   Pulse (!) 106   Ht 5\' 5"  (1.651 m)   Wt 285 lb (129.3 kg)   BMI 47.43 kg/m     Wt Readings from Last 3 Encounters:  02/19/18 285 lb (129.3 kg)  12/06/16 270 lb (122.5 kg)  04/17/16 229 lb (103.9 kg)     GEN: Morbid obesity.  Well nourished, well developed in no acute distress HEENT: Normal NECK: No JVD. LYMPHATICS: No lymphadenopathy CARDIAC: RRR, no murmur, no gallop, no edema. VASCULAR: 2+ radial and carotid bilateral pulses.  No bruits. RESPIRATORY:  Clear to auscultation without rales, wheezing or rhonchi  ABDOMEN: Soft, non-tender, non-distended, No  pulsatile mass, MUSCULOSKELETAL: No deformity  SKIN: Warm and dry NEUROLOGIC:  Alert and oriented x 3 PSYCHIATRIC:  Normal affect   ASSESSMENT:    1. Chronic systolic heart failure (HCC)   2. Essential hypertension   3. Morbid obesity (HCC)  4. Non-ischemic cardiomyopathy (HCC)   5. Obsessive-compulsive disorder, unspecified type    PLAN:    In order of problems listed above:  1. Heart rate and blood pressure both increased because she no longer takes diuretic and beta-blocker.  Cannot afford the medication which she gets from community health for $4 per prescription.  She cannot tell me when the last time was that she had her medication.  She is also off of her psychotropic medication. 2. Blood pressure is elevated.  Target is 130/80 mmHg or less.  We discussed salt restriction and nonsteroidal anti-inflammatory therapy. 3. He significant and morbidity driving problem.  Quality of food is probably poor and mostly carbohydrate leading to rapid weight gain. 4. Current LVEF is unknown. 5. Not taking her psychiatric medications.  Encouraged her efforts to get SSI.  Low-salt diet encouraged.  Weight loss is encouraged.  Medication compliance is also discussed and he is very important.  We discussed the possibility that systolic dysfunction could recur.   Medication Adjustments/Labs and Tests Ordered: Current medicines are reviewed at length with the patient today.  Concerns regarding medicines are outlined above.  Orders Placed This Encounter  Procedures  . EKG 12-Lead   No orders of the defined types were placed in this encounter.   There are no Patient Instructions on file for this visit.   Signed, Lesleigh Noe, MD  02/19/2018 4:24 PM    [facility]

## 2018-02-19 ENCOUNTER — Encounter

## 2018-02-19 ENCOUNTER — Ambulatory Visit (INDEPENDENT_AMBULATORY_CARE_PROVIDER_SITE_OTHER): Payer: BLUE CROSS/BLUE SHIELD | Admitting: Interventional Cardiology

## 2018-02-19 ENCOUNTER — Encounter: Payer: Self-pay | Admitting: Interventional Cardiology

## 2018-02-19 VITALS — BP 152/98 | HR 106 | Ht 65.0 in | Wt 285.0 lb

## 2018-02-19 DIAGNOSIS — I10 Essential (primary) hypertension: Secondary | ICD-10-CM | POA: Diagnosis not present

## 2018-02-19 DIAGNOSIS — I5022 Chronic systolic (congestive) heart failure: Secondary | ICD-10-CM

## 2018-02-19 DIAGNOSIS — I428 Other cardiomyopathies: Secondary | ICD-10-CM

## 2018-02-19 DIAGNOSIS — F429 Obsessive-compulsive disorder, unspecified: Secondary | ICD-10-CM

## 2018-02-19 NOTE — Patient Instructions (Addendum)
Medication Instructions:  1) Please resume taking your Spironolactone and Carvedilol as soon as you can.  Labwork: None  Testing/Procedures: None  Follow-Up: Your physician wants you to follow-up in: 4-6 months with Dr. [name] or as needed with Dr. [name].  You will receive a reminder letter in the mail two months in advance. If you don't receive a letter, please call our office to schedule the follow-up appointment.   Any Other Special Instructions Will Be Listed Below (If Applicable).     If you need a refill on your cardiac medications before your next appointment, please call your pharmacy.

## 2018-03-06 ENCOUNTER — Other Ambulatory Visit: Payer: Self-pay | Admitting: Interventional Cardiology

## 2018-03-06 MED ORDER — SPIRONOLACTONE 25 MG PO TABS: 25.0000 mg | ORAL_TABLET | Freq: Every day | ORAL | 3 refills | Status: DC

## 2018-03-06 NOTE — Telephone Encounter (Signed)
New message   *STAT* If patient is at the pharmacy, call can be transferred to refill team.  **  1. Which medications need to be refilled? (please list name of each medication and dose if known)spironolactone (ALDACTONE) 25 MG tablet  2. Which pharmacy/location (including street and city if local pharmacy) is medication to be sent to?[id number] - [place], [place] - [address] AT [facility] OF [address]  3. Do they need a 30 day or 90 day supply?30

## 2018-03-06 NOTE — Telephone Encounter (Signed)
Pt's medication was sent to pt's pharmacy as requested. Confirmation received.  °

## 2018-03-26 ENCOUNTER — Encounter (HOSPITAL_COMMUNITY): Payer: Self-pay | Admitting: Psychiatry

## 2018-03-26 ENCOUNTER — Ambulatory Visit (INDEPENDENT_AMBULATORY_CARE_PROVIDER_SITE_OTHER): Payer: BLUE CROSS/BLUE SHIELD | Admitting: Psychiatry

## 2018-03-26 VITALS — BP 148/90 | HR 95 | Ht 65.0 in | Wt 286.0 lb

## 2018-03-26 DIAGNOSIS — F332 Major depressive disorder, recurrent severe without psychotic features: Secondary | ICD-10-CM

## 2018-03-26 DIAGNOSIS — F422 Mixed obsessional thoughts and acts: Secondary | ICD-10-CM | POA: Diagnosis not present

## 2018-03-26 DIAGNOSIS — F411 Generalized anxiety disorder: Secondary | ICD-10-CM | POA: Diagnosis not present

## 2018-03-26 DIAGNOSIS — G4701 Insomnia due to medical condition: Secondary | ICD-10-CM

## 2018-03-26 MED ORDER — GABAPENTIN 300 MG PO CAPS: 300.0000 mg | ORAL_CAPSULE | Freq: Two times a day (BID) | ORAL | 1 refills | Status: DC

## 2018-03-26 MED ORDER — BUSPIRONE HCL 10 MG PO TABS: 10.0000 mg | ORAL_TABLET | Freq: Two times a day (BID) | ORAL | 1 refills | Status: DC

## 2018-03-26 MED ORDER — SERTRALINE HCL 100 MG PO TABS: 100.0000 mg | ORAL_TABLET | Freq: Every day | ORAL | 1 refills | Status: DC
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
## 2018-06-25 ENCOUNTER — Other Ambulatory Visit: Payer: Self-pay | Admitting: Interventional Cardiology

## 2018-06-25 ENCOUNTER — Encounter (HOSPITAL_COMMUNITY): Payer: Self-pay | Admitting: Psychiatry

## 2018-06-25 ENCOUNTER — Ambulatory Visit (INDEPENDENT_AMBULATORY_CARE_PROVIDER_SITE_OTHER): Payer: BLUE CROSS/BLUE SHIELD | Admitting: Psychiatry

## 2018-06-25 VITALS — BP 133/78 | HR 80 | Ht 65.0 in | Wt 280.0 lb

## 2018-06-25 DIAGNOSIS — F422 Mixed obsessional thoughts and acts: Secondary | ICD-10-CM | POA: Diagnosis not present

## 2018-06-25 DIAGNOSIS — F411 Generalized anxiety disorder: Secondary | ICD-10-CM | POA: Diagnosis not present

## 2018-06-25 DIAGNOSIS — F332 Major depressive disorder, recurrent severe without psychotic features: Secondary | ICD-10-CM | POA: Diagnosis not present

## 2018-06-25 MED ORDER — SERTRALINE HCL 100 MG PO TABS: 200.0000 mg | ORAL_TABLET | Freq: Every day | ORAL | 2 refills | Status: DC

## 2018-06-25 MED ORDER — CARVEDILOL 6.25 MG PO TABS: 6.2500 mg | ORAL_TABLET | Freq: Two times a day (BID) | ORAL | 8 refills | Status: DC

## 2018-06-25 MED ORDER — BUSPIRONE HCL 10 MG PO TABS: 10.0000 mg | ORAL_TABLET | Freq: Two times a day (BID) | ORAL | 2 refills | Status: DC

## 2018-06-25 MED ORDER — GABAPENTIN 300 MG PO CAPS: 300.0000 mg | ORAL_CAPSULE | Freq: Two times a day (BID) | ORAL | 2 refills | Status: DC

## 2018-06-25 NOTE — Telephone Encounter (Signed)
°*  STAT* If patient is at the pharmacy, call can be transferred to refill team.   1. Which medications need to be refilled? (please list name of each medication and dose if known)   carvedilol (COREG) 6.25 MG tablet  2. Which pharmacy/location (including street and city if local pharmacy) is medication to be sent to?  [id number] - [place], [place] - [address] AT [facility] OF [address]  3. Do they need a 30 day or 90 day supply?  30 days
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
## 2018-09-24 ENCOUNTER — Other Ambulatory Visit: Payer: Self-pay

## 2018-09-24 ENCOUNTER — Ambulatory Visit (HOSPITAL_COMMUNITY): Payer: BLUE CROSS/BLUE SHIELD | Admitting: Psychiatry

## 2018-10-15 ENCOUNTER — Ambulatory Visit (INDEPENDENT_AMBULATORY_CARE_PROVIDER_SITE_OTHER): Payer: BLUE CROSS/BLUE SHIELD | Admitting: Psychiatry

## 2018-10-15 ENCOUNTER — Encounter (HOSPITAL_COMMUNITY): Payer: Self-pay | Admitting: Psychiatry

## 2018-10-15 ENCOUNTER — Other Ambulatory Visit: Payer: Self-pay

## 2018-10-15 DIAGNOSIS — F422 Mixed obsessional thoughts and acts: Secondary | ICD-10-CM | POA: Diagnosis not present

## 2018-10-15 DIAGNOSIS — F332 Major depressive disorder, recurrent severe without psychotic features: Secondary | ICD-10-CM | POA: Diagnosis not present

## 2018-10-15 DIAGNOSIS — F411 Generalized anxiety disorder: Secondary | ICD-10-CM

## 2018-10-15 MED ORDER — GABAPENTIN 300 MG PO CAPS: 300.0000 mg | ORAL_CAPSULE | Freq: Two times a day (BID) | ORAL | 2 refills | Status: DC

## 2018-10-15 MED ORDER — SERTRALINE HCL 100 MG PO TABS: 200.0000 mg | ORAL_TABLET | Freq: Every day | ORAL | 2 refills | Status: DC

## 2018-10-15 MED ORDER — BUSPIRONE HCL 10 MG PO TABS: 20.0000 mg | ORAL_TABLET | Freq: Two times a day (BID) | ORAL | 2 refills | Status: DC
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
## 2018-11-26 ENCOUNTER — Encounter (HOSPITAL_COMMUNITY): Payer: Self-pay | Admitting: Psychiatry

## 2018-11-26 ENCOUNTER — Other Ambulatory Visit: Payer: Self-pay

## 2018-11-26 ENCOUNTER — Ambulatory Visit (INDEPENDENT_AMBULATORY_CARE_PROVIDER_SITE_OTHER): Payer: BLUE CROSS/BLUE SHIELD | Admitting: Psychiatry

## 2018-11-26 DIAGNOSIS — F332 Major depressive disorder, recurrent severe without psychotic features: Secondary | ICD-10-CM | POA: Diagnosis not present

## 2018-11-26 DIAGNOSIS — F422 Mixed obsessional thoughts and acts: Secondary | ICD-10-CM

## 2018-11-26 DIAGNOSIS — F411 Generalized anxiety disorder: Secondary | ICD-10-CM

## 2018-11-26 MED ORDER — DIVALPROEX SODIUM ER 250 MG PO TB24: 250.0000 mg | ORAL_TABLET | Freq: Every day | ORAL | 2 refills | Status: DC

## 2018-11-26 MED ORDER — SERTRALINE HCL 100 MG PO TABS: 200.0000 mg | ORAL_TABLET | Freq: Every day | ORAL | 2 refills | Status: DC

## 2018-11-26 MED ORDER — BUSPIRONE HCL 10 MG PO TABS: 20.0000 mg | ORAL_TABLET | Freq: Two times a day (BID) | ORAL | 2 refills | Status: DC

## 2018-11-26 NOTE — Progress Notes (Signed)
Virtual Visit via Telephone Note  I connected with Abigail Sharp on 11/26/18 at  8:30 AM EDT by telephone and verified that I am speaking with the correct person using two identifiers.  Location: Patient: home Provider: office   I discussed the limitations, risks, security and privacy concerns of performing an evaluation and management service by telephone and the availability of in person appointments. I also discussed with the patient that there may be a patient responsible charge related to this service. The patient expressed understanding and agreed to proceed.   History of Present Illness: Patient reports that nothing is really changed.  She continues to worry a lot.  She does not go out unless she has to.  She went to the grocery store yesterday because she had no choice.  Her sleep is poor.  She states that she lays down but she cannot stop her mind.  As a result she is always tired.  Abigail Sharp is spending most of the day and night in bed.  She states that her anxiety is as high as it is and has always been.  Her depression is ongoing and nothing really seems to be helping.  She is denying any SI/HI.   Observations/Objective: I spoke with Abigail Sharp on the phone.  Pt was calm, pleasant and cooperative.  Pt was engaged in the conversation and answered questions appropriately.  Speech was clear and coherent with normal rate, tone and volume.  Mood is depressed and anxious, affect is congruent. Thought processes are coherent and intact but overall slow.  Thought content is logical.  Pt denies SI/HI.   Pt denies auditory and visual hallucinations and did not appear to be responding to internal stimuli.  Memory is on the poor side and concentration is fair.  Fund of knowledge and use of language are average.  Insight and judgment are fair.  I am unable to comment on psychomotor activity, general appearance, hygiene, or eye contact as I was unable to physically see the patient on the  phone.   Assessment and Plan:  MDD-single, severe without psychotic features; GAD;OCD; insomnia due to medical condition; r/o agoraphobia  Continue Zoloft 200mg  po qD  Continue Buspar 20mg  po BID  Start trial of Depakote ER 250mg  po qHS  I will avoid treatment with sedative/hypontics due to congestive heart failure  Follow Up Instructions: 2 mo or sooner if needed   I discussed the assessment and treatment plan with the patient. The patient was provided an opportunity to ask questions and all were answered. The patient agreed with the plan and demonstrated an understanding of the instructions.   The patient was advised to call back or seek an in-person evaluation if the symptoms worsen or if the condition fails to improve as anticipated.  I provided 20 minutes of non-face-to-face time during this encounter.   Charlcie Cradle, MD

## 2019-01-21 ENCOUNTER — Ambulatory Visit (INDEPENDENT_AMBULATORY_CARE_PROVIDER_SITE_OTHER): Payer: BLUE CROSS/BLUE SHIELD | Admitting: Psychiatry

## 2019-01-21 ENCOUNTER — Encounter (HOSPITAL_COMMUNITY): Payer: Self-pay | Admitting: Psychiatry

## 2019-01-21 ENCOUNTER — Other Ambulatory Visit: Payer: Self-pay

## 2019-01-21 DIAGNOSIS — F422 Mixed obsessional thoughts and acts: Secondary | ICD-10-CM

## 2019-01-21 DIAGNOSIS — F411 Generalized anxiety disorder: Secondary | ICD-10-CM

## 2019-01-21 DIAGNOSIS — F322 Major depressive disorder, single episode, severe without psychotic features: Secondary | ICD-10-CM | POA: Diagnosis not present

## 2019-01-21 DIAGNOSIS — G4701 Insomnia due to medical condition: Secondary | ICD-10-CM | POA: Diagnosis not present

## 2019-01-21 DIAGNOSIS — F332 Major depressive disorder, recurrent severe without psychotic features: Secondary | ICD-10-CM

## 2019-01-21 MED ORDER — BUSPIRONE HCL 10 MG PO TABS: 20.0000 mg | ORAL_TABLET | Freq: Two times a day (BID) | ORAL | 2 refills | Status: DC

## 2019-01-21 MED ORDER — SERTRALINE HCL 100 MG PO TABS: 200.0000 mg | ORAL_TABLET | Freq: Every day | ORAL | 2 refills | Status: DC

## 2019-01-21 MED ORDER — DIVALPROEX SODIUM ER 500 MG PO TB24: 500.0000 mg | ORAL_TABLET | Freq: Every day | ORAL | 1 refills | Status: DC

## 2019-01-21 NOTE — Progress Notes (Signed)
Virtual Visit via Telephone Note  I connected with Abigail Sharp on 01/21/19 at  8:00 AM EDT by telephone and verified that I am speaking with the correct person using two identifiers.  Location: Patient: home Provider: office   I discussed the limitations, risks, security and privacy concerns of performing an evaluation and management service by telephone and the availability of in person appointments. I also discussed with the patient that there may be a patient responsible charge related to this service. The patient expressed understanding and agreed to proceed.   History of Present Illness: "I am anxious all the time". She continues to have fear from "any little noise makes me jump out of my skin". Abigail Sharp is always worried that something will happen to her son and is always thinking about him. Pt is focused on one neighbor who she states she heard laughing and talking about Abigail Sharp. Since then Abigail Sharp avoids this neighbor at all costs. Abigail Sharp rarely leaves her apartment. She is ok when she is out in public because they are strangers and don't know her. Abigail Sharp is feeling less confrontational when in public. She is calmer when dealing with stress. Pt no longer wants to confront this particular women. Abigail Sharp is really low and can't seem to get out her depression. She has been taking the Depakote and is not sure what effect it is having. Pt is still spending a lot of time napping thru the day and night. "It's like I have no life". She is getting along well with her son and he is very supportive. Pt feels like a burden and so she has applied for SSI. She is feeling down due to being denies SSI. Abigail Sharp denies SI/HI.   Observations/Objective: I spoke with Abigail Sharp on the phone.  Pt was calm, pleasant and cooperative.  Pt was engaged in the conversation and answered questions appropriately.  Speech was clear and coherent with normal rate, tone and volume.  Mood is depressed and anxious, affect is congruent.  Thought processes are coherent and circumstantial.  Thought content is with ruminations and some paranoia.  Pt denies SI/HI.   Pt denies auditory and visual hallucinations and did not appear to be responding to internal stimuli.  Memory and concentration are good.  Fund of knowledge and use of language are average.  Insight and judgment are fair.  I am unable to comment on psychomotor activity, general appearance, hygiene, or eye contact as I was unable to physically see the patient on the phone.  Vital signs not available since interview conducted virtually.     I reviewed the information below of 01/21/2019 and have updated it Assessment and Plan: MDD-single, severe without psychotic features; GAD;OCD; insomnia due to medical condition; r/o agoraphobia   Continue Zoloft 200mg  po qD   Continue Buspar 20mg  po BID   Increase Depakote ER 500mg  po qHS   I will avoid treatment with sedative/hypontics due to congestive heart failure   Follow Up Instructions: In 4-6 weeks or sooner if needed   I discussed the assessment and treatment plan with the patient. The patient was provided an opportunity to ask questions and all were answered. The patient agreed with the plan and demonstrated an understanding of the instructions.   The patient was advised to call back or seek an in-person evaluation if the symptoms worsen or if the condition fails to improve as anticipated.  I provided 35 minutes of non-face-to-face time during this encounter.   Charlcie Cradle, MD

## 2019-02-01 ENCOUNTER — Telehealth: Payer: Self-pay | Admitting: *Deleted

## 2019-02-01 NOTE — Telephone Encounter (Signed)
Spoke with Abigail Sharp and changed appt to virtual.  Abigail Sharp agreeable to phone visit.  Abigail Sharp unable to check HR and BP from home.  Went over medications.      Virtual Visit Pre-Appointment Phone Call  "(Name), I am calling you today to discuss your upcoming appointment. We are currently trying to limit exposure to the virus that causes COVID-19 by seeing patients at home rather than in the office."  1. "What is the BEST phone number to call the day of the visit?" - include this in appointment notes  2. "Do you have or have access to (through a family member/friend) a smartphone with video capability that we can use for your visit?" a. If yes - list this number in appt notes as "cell" (if different from BEST phone #) and list the appointment type as a VIDEO visit in appointment notes b. If no - list the appointment type as a PHONE visit in appointment notes  3. Confirm consent - "In the setting of the current Covid19 crisis, you are scheduled for a (phone or video) visit with your provider on (date) at (time).  Just as we do with many in-office visits, in order for you to participate in this visit, we must obtain consent.  If you'd like, I can send this to your mychart (if signed up) or email for you to review.  Otherwise, I can obtain your verbal consent now.  All virtual visits are billed to your insurance company just like a normal visit would be.  By agreeing to a virtual visit, we'd like you to understand that the technology does not allow for your provider to perform an examination, and thus may limit your provider's ability to fully assess your condition. If your provider identifies any concerns that need to be evaluated in person, we will make arrangements to do so.  Finally, though the technology is pretty good, we cannot assure that it will always work on either your or our end, and in the setting of a video visit, we may have to convert it to a phone-only visit.  In either situation, we cannot ensure that  we have a secure connection.  Are you willing to proceed?" STAFF: Did the patient verbally acknowledge consent to telehealth visit? Document YES/NO here: YES  4. Advise patient to be prepared - "Two hours prior to your appointment, go ahead and check your blood pressure, pulse, oxygen saturation, and your weight (if you have the equipment to check those) and write them all down. When your visit starts, your provider will ask you for this information. If you have an Apple Watch or Kardia device, please plan to have heart rate information ready on the day of your appointment. Please have a pen and paper handy nearby the day of the visit as well."  5. Give patient instructions for MyChart download to smartphone OR Doximity/Doxy.me as below if video visit (depending on what platform provider is using)  6. Inform patient they will receive a phone call 15 minutes prior to their appointment time (may be from unknown caller ID) so they should be prepared to answer    TELEPHONE CALL NOTE  Celene Kroh has been deemed a candidate for a follow-up tele-health visit to limit community exposure during the Covid-19 pandemic. I spoke with the patient via phone to ensure availability of phone/video source, confirm preferred email & phone number, and discuss instructions and expectations.  I reminded Abigail Sharp to be prepared with any vital sign and/or  heart rhythm information that could potentially be obtained via home monitoring, at the time of her visit. I reminded Abigail Sharp to expect a phone call prior to her visit.  Abigail Racer, RN 02/01/2019 5:14 PM   INSTRUCTIONS FOR DOWNLOADING THE MYCHART APP TO SMARTPHONE  - The patient must first make sure to have activated MyChart and know their login information - If Apple, go to [employer] and type in MyChart in the search bar and download the app. If Android, ask patient to go to [employer] and type in [place] in the search bar and download the  app. The app is free but as with any other app downloads, their phone may require them to verify saved payment information or Apple/Android password.  - The patient will need to then log into the app with their MyChart username and password, and select [facility] as their healthcare provider to link the account. When it is time for your visit, go to the MyChart app, find appointments, and click Begin Video Visit. Be sure to Select Allow for your device to access the Microphone and Camera for your visit. You will then be connected, and your provider will be with you shortly.  **If they have any issues connecting, or need assistance please contact MyChart service desk [phone])**  **If using a computer, in order to ensure the best quality for their visit they will need to use either of the following Internet Browsers: [employer], or Google Chrome**  IF USING DOXIMITY or DOXY.ME - The patient will receive a link just prior to their visit by text.     FULL LENGTH CONSENT FOR TELE-HEALTH VISIT   I hereby voluntarily request, consent and authorize [facility] and its employed or contracted physicians, physician assistants, nurse practitioners or other licensed health care professionals (the Practitioner), to provide me with telemedicine health care services (the "Services") as deemed necessary by the treating Practitioner. I acknowledge and consent to receive the Services by the Practitioner via telemedicine. I understand that the telemedicine visit will involve communicating with the Practitioner through live audiovisual communication technology and the disclosure of certain medical information by electronic transmission. I acknowledge that I have been given the opportunity to request an in-person assessment or other available alternative prior to the telemedicine visit and am voluntarily participating in the telemedicine visit.  I understand that I have the right to withhold or  withdraw my consent to the use of telemedicine in the course of my care at any time, without affecting my right to future care or treatment, and that the Practitioner or I may terminate the telemedicine visit at any time. I understand that I have the right to inspect all information obtained and/or recorded in the course of the telemedicine visit and may receive copies of available information for a reasonable fee.  I understand that some of the potential risks of receiving the Services via telemedicine include:  Marland Kitchen Delay or interruption in medical evaluation due to technological equipment failure or disruption; . Information transmitted may not be sufficient (e.g. poor resolution of images) to allow for appropriate medical decision making by the Practitioner; and/or  . In rare instances, security protocols could fail, causing a breach of personal health information.  Furthermore, I acknowledge that it is my responsibility to provide information about my medical history, conditions and care that is complete and accurate to the best of my ability. I acknowledge that Practitioner's advice, recommendations, and/or decision may be based  on factors not within their control, such as incomplete or inaccurate data provided by me or distortions of diagnostic images or specimens that may result from electronic transmissions. I understand that the practice of medicine is not an exact science and that Practitioner makes no warranties or guarantees regarding treatment outcomes. I acknowledge that I will receive a copy of this consent concurrently upon execution via email to the email address I last provided but may also request a printed copy by calling the office of [facility].    I understand that my insurance will be billed for this visit.   I have read or had this consent read to me. . I understand the contents of this consent, which adequately explains the benefits and risks of the Services being provided via  telemedicine.  . I have been provided ample opportunity to ask questions regarding this consent and the Services and have had my questions answered to my satisfaction. . I give my informed consent for the services to be provided through the use of telemedicine in my medical care  By participating in this telemedicine visit I agree to the above.

## 2019-02-01 NOTE — Telephone Encounter (Signed)
Call placed to pt re: appt 02/02/2019.  Need to change to virtual and move her time up a little. Left pt a message to call back.

## 2019-02-01 NOTE — Progress Notes (Signed)
Virtual Visit via Telephone Note   This visit type was conducted due to national recommendations for restrictions regarding the COVID-19 Pandemic (e.g. social distancing) in an effort to limit this patient's exposure and mitigate transmission in our community.  Due to her co-morbid illnesses, this patient is at least at moderate risk for complications without adequate follow up.  This format is felt to be most appropriate for this patient at this time.  The patient did not have access to video technology/had technical difficulties with video requiring transitioning to audio format only (telephone).  All issues noted in this document were discussed and addressed.  No physical exam could be performed with this format.  Please refer to the patient's chart for her  consent to telehealth for [facility].   Date:  02/01/2019   ID:  Abigail Sharp, DOB 1966-05-04, MRN [id number]  Patient Location: Home Provider Location: Home  PCP:  Maren Reamer, MD (Inactive)  Cardiologist:  No primary care provider on file.  Electrophysiologist:  None   Evaluation Performed:  Follow-Up Visit  Chief Complaint:  CHF  History of Present Illness:    Abigail Sharp is a 53 y.o. female with  systolic heart failure, hypertension, pre-diabetes, LVEF 25% improved to 55% on medical therapy.   She is doing well.  She is appreciative of the phone call.  She does have dyspnea on exertion although this is relatively stable.  She denies orthopnea.  She is taking carvedilol and Spironolactone as recommended by [place].  She denies chest pain.  The patient does not have symptoms concerning for COVID-19 infection (fever, chills, cough, or new shortness of breath).    Past Medical History:  Diagnosis Date  . Anxiety   . Chronic systolic heart failure ([place])   . Depression   . HTN (hypertension)   . Microcytic anemia   . NICM (nonischemic cardiomyopathy) ([facility]) 11/2014   EF is 25% improved to 55% in 2015.  . Pre-diabetes     Past Surgical History:  Procedure Laterality Date  . CARDIAC CATHETERIZATION N/A 11/07/2014   Procedure: Right/Left Heart Cath and Coronary Angiography;  Surgeon: Lorretta Harp, MD;  Location: [facility];  Service: Cardiovascular;  Laterality: N/A;  . TONSILLECTOMY    . TUBAL LIGATION       Current Meds  Medication Sig  . aspirin 81 MG EC tablet Take 1 tablet (81 mg total) by mouth daily.  . busPIRone (BUSPAR) 10 MG tablet Take 2 tablets (20 mg total) by mouth 2 (two) times daily.  . carvedilol (COREG) 6.25 MG tablet Take 1 tablet (6.25 mg total) by mouth 2 (two) times daily with a meal.  . divalproex (DEPAKOTE ER) 500 MG 24 hr tablet Take 1 tablet (500 mg total) by mouth at bedtime.  . gabapentin (NEURONTIN) 300 MG capsule Take 1 capsule (300 mg total) by mouth 2 (two) times daily.  . sertraline (ZOLOFT) 100 MG tablet Take 2 tablets (200 mg total) by mouth daily.  Marland Kitchen spironolactone (ALDACTONE) 25 MG tablet Take 1 tablet (25 mg total) by mouth daily.     Allergies:   Sulfa antibiotics   Social History   Tobacco Use  . Smoking status: Never Smoker  . Smokeless tobacco: Never Used  Substance Use Topics  . Alcohol use: No    Alcohol/week: 0.0 standard drinks  . Drug use: No     Family Hx: The patient's family history includes ADD / ADHD in her son; Diabetes in her  father, mother, and sister; Heart disease in her father; Hypertension in her father and mother. There is no history of Alcohol abuse, Anxiety disorder, Bipolar disorder, Depression, or Drug abuse.  ROS:   Please see the history of present illness.    Some depression.  She has not had syncope.  No bleeding.  Appetite is been stable. All other systems reviewed and are negative.   Prior CV studies:   The following studies were reviewed today:  No new or recent imaging/functional testing.  Labs/Other Tests and Data Reviewed:    EKG:  No ECG reviewed.  Recent Labs: No results found for requested labs  within last 8760 hours.   Recent Lipid Panel Lab Results  Component Value Date/Time   CHOL 189 08/23/2015 10:28 AM   TRIG 290 (H) 08/23/2015 10:28 AM   HDL 36 (L) 08/23/2015 10:28 AM   CHOLHDL 5.3 (H) 08/23/2015 10:28 AM   LDLCALC 95 08/23/2015 10:28 AM    Wt Readings from Last 3 Encounters:  02/19/18 285 lb (129.3 kg)  12/06/16 270 lb (122.5 kg)  04/17/16 229 lb (103.9 kg)     Objective:    Vital Signs:  There were no vitals taken for this visit.   VITAL SIGNS:  reviewed no vital signs are available  ASSESSMENT & PLAN:    1. Chronic systolic heart failure (HCC)   2. Essential hypertension   3. Morbid obesity (HCC)   4. Educated About Covid-19 Virus Infection    PLAN:  1. The patient is stable.  She has no new or acute complaints.  Her heart failure regimen has been limited by the inability to use renin angiotensin system inhibitors due hypotension. 2. Blood pressure was not measured today. 3. Still obese as she says she has not lost weight. 4. Social distancing, masking, and handwashing precautions are being observed.  She needs to be seen in person before the end of the year to assess volume status and determine if further up titration of heart failure therapy is possible.  COVID-19 Education: The signs and symptoms of COVID-19 were discussed with the patient and how to seek care for testing (follow up with PCP or arrange E-visit).  The importance of social distancing was discussed today.  Time:   Today, I have spent 10 minutes with the patient with telehealth technology discussing the above problems.     Medication Adjustments/Labs and Tests Ordered: Current medicines are reviewed at length with the patient today.  Concerns regarding medicines are outlined above.   Tests Ordered: No orders of the defined types were placed in this encounter.   Medication Changes: No orders of the defined types were placed in this encounter.   Follow Up:  In Person in 2  month(s)  Signed, Lesleigh Noe, MD  02/01/2019 9:16 PM    [facility]

## 2019-02-02 ENCOUNTER — Telehealth (INDEPENDENT_AMBULATORY_CARE_PROVIDER_SITE_OTHER): Payer: BLUE CROSS/BLUE SHIELD | Admitting: Interventional Cardiology

## 2019-02-02 ENCOUNTER — Encounter: Payer: Self-pay | Admitting: Interventional Cardiology

## 2019-02-02 ENCOUNTER — Other Ambulatory Visit: Payer: Self-pay

## 2019-02-02 VITALS — Ht 65.0 in | Wt 260.0 lb

## 2019-02-02 DIAGNOSIS — I10 Essential (primary) hypertension: Secondary | ICD-10-CM

## 2019-02-02 DIAGNOSIS — I5022 Chronic systolic (congestive) heart failure: Secondary | ICD-10-CM | POA: Diagnosis not present

## 2019-02-02 DIAGNOSIS — Z7189 Other specified counseling: Secondary | ICD-10-CM

## 2019-02-02 NOTE — Patient Instructions (Signed)
Medication Instructions:  Your physician recommends that you continue on your current medications as directed. Please refer to the Current Medication list given to you today.  If you need a refill on your cardiac medications before your next appointment, please call your pharmacy.   Lab work: None If you have labs (blood work) drawn today and your tests are completely normal, you will receive your results only by: . MyChart Message (if you have MyChart) OR . A paper copy in the mail If you have any lab test that is abnormal or we need to change your treatment, we will call you to review the results.  Testing/Procedures: None  Follow-Up: At [facility], you and your health needs are our priority.  As part of our continuing mission to provide you with exceptional heart care, we have created designated Provider Care Teams.  These Care Teams include your primary Cardiologist (physician) and Advanced Practice Providers (APPs -  Physician Assistants and Nurse Practitioners) who all work together to provide you with the care you need, when you need it. You will need a follow up appointment in 3 months.  Please call our office 2 months in advance to schedule this appointment.  You may see Dr. Smith or one of the following Advanced Practice Providers on your designated Care Team:   Lori Gerhardt, NP Laura Ingold, NP . Jill McDaniel, NP  Any Other Special Instructions Will Be Listed Below (If Applicable).    

## 2019-02-25 ENCOUNTER — Encounter (HOSPITAL_COMMUNITY): Payer: Self-pay | Admitting: Psychiatry

## 2019-02-25 ENCOUNTER — Other Ambulatory Visit: Payer: Self-pay

## 2019-02-25 ENCOUNTER — Ambulatory Visit (INDEPENDENT_AMBULATORY_CARE_PROVIDER_SITE_OTHER): Payer: BLUE CROSS/BLUE SHIELD | Admitting: Psychiatry

## 2019-02-25 DIAGNOSIS — F422 Mixed obsessional thoughts and acts: Secondary | ICD-10-CM

## 2019-02-25 DIAGNOSIS — F411 Generalized anxiety disorder: Secondary | ICD-10-CM | POA: Diagnosis not present

## 2019-02-25 DIAGNOSIS — F332 Major depressive disorder, recurrent severe without psychotic features: Secondary | ICD-10-CM | POA: Diagnosis not present

## 2019-02-25 MED ORDER — SERTRALINE HCL 100 MG PO TABS: 200.0000 mg | ORAL_TABLET | Freq: Every day | ORAL | 2 refills | Status: DC

## 2019-02-25 MED ORDER — BUSPIRONE HCL 10 MG PO TABS: 20.0000 mg | ORAL_TABLET | Freq: Two times a day (BID) | ORAL | 2 refills | Status: DC

## 2019-02-25 MED ORDER — DIVALPROEX SODIUM ER 500 MG PO TB24: 500.0000 mg | ORAL_TABLET | Freq: Every day | ORAL | 2 refills | Status: DC
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
## 2019-03-04 ENCOUNTER — Telehealth: Payer: Self-pay | Admitting: Interventional Cardiology

## 2019-03-04 NOTE — Telephone Encounter (Signed)
Pt had a virtual appt with Dr. Tamala Julian on 9/1 and was told to f/u in 3 months.  Tried to call pt after Dr. Tamala Julian disconnected with her and left a message to call back to schedule.  Called pt on 9/2, 9/10, 9/17 and 10/1 to try to schedule appt.  Left message to call back each time.

## 2019-03-09 NOTE — Telephone Encounter (Signed)
Pt called back and scheduled appt for 12/23

## 2019-05-13 ENCOUNTER — Other Ambulatory Visit: Payer: Self-pay

## 2019-05-13 ENCOUNTER — Ambulatory Visit (INDEPENDENT_AMBULATORY_CARE_PROVIDER_SITE_OTHER): Payer: BLUE CROSS/BLUE SHIELD | Admitting: Psychiatry

## 2019-05-13 ENCOUNTER — Encounter (HOSPITAL_COMMUNITY): Payer: Self-pay | Admitting: Psychiatry

## 2019-05-13 DIAGNOSIS — F422 Mixed obsessional thoughts and acts: Secondary | ICD-10-CM

## 2019-05-13 DIAGNOSIS — F411 Generalized anxiety disorder: Secondary | ICD-10-CM

## 2019-05-13 DIAGNOSIS — F332 Major depressive disorder, recurrent severe without psychotic features: Secondary | ICD-10-CM

## 2019-05-13 MED ORDER — SERTRALINE HCL 100 MG PO TABS: 200.0000 mg | ORAL_TABLET | Freq: Every day | ORAL | 2 refills | Status: DC

## 2019-05-13 MED ORDER — BUSPIRONE HCL 10 MG PO TABS: 20.0000 mg | ORAL_TABLET | Freq: Two times a day (BID) | ORAL | 2 refills | Status: DC

## 2019-05-13 MED ORDER — DIVALPROEX SODIUM ER 500 MG PO TB24: 500.0000 mg | ORAL_TABLET | Freq: Every day | ORAL | 2 refills | Status: DC

## 2019-05-13 MED ORDER — GABAPENTIN 300 MG PO CAPS: 300.0000 mg | ORAL_CAPSULE | Freq: Two times a day (BID) | ORAL | 0 refills | Status: DC

## 2019-05-13 MED ORDER — DIVALPROEX SODIUM ER 250 MG PO TB24: 750.0000 mg | ORAL_TABLET | Freq: Every day | ORAL | 2 refills | Status: DC

## 2019-05-13 NOTE — Progress Notes (Signed)
  Virtual Visit via Telephone Note  I connected with Abigail Sharp  on 05/13/19 at  1:00 PM EST by telephone and verified that I am speaking with the correct person using two identifiers.  Location: Patient: home Provider: office   I discussed the limitations, risks, security and privacy concerns of performing an evaluation and management service by telephone and the availability of in person appointments. I also discussed with the patient that there may be a patient responsible charge related to this service. The patient expressed understanding and agreed to proceed.   History of Present Illness: Pt states she is very depressed. She is always tired and is stays in bed all day. Abigail Sharp is unmotivated and lonely. She feels that everything feels like a chore. She talks to her son and sister daily. Pt avoids going out and doesn't want to make any friends due to past experiences. Pt does not like to leave her home and goes out only for food when she has too. She denies SI/HI. Abigail Sharp does engage in her hobby of couponing and it makes it her happy. She is still cleaning her house obsessively. Her anxiety is unchanged and she is always worried about her son and his safety. Her HV has decreased and she is no longer scared of her shadow in the kitchen.     Observations/Objective: I spoke with Abigail Sharp on the phone.  Pt was calm, pleasant and cooperative.  Pt was engaged in the conversation and answered questions appropriately.  Speech was clear and coherent with normal rate, tone and volume.  Mood is depressed and anxious, affect is congruent. Thought processes are coherent, goal oriented and intact.  Thought content is with ruminations.  Pt denies SI/HI.   Pt denies auditory and visual hallucinations and did not appear to be responding to internal stimuli.  Memory and concentration are good.  Fund of knowledge and use of language are average.  Insight and judgment are fair.  I am unable to comment on  psychomotor activity, general appearance, hygiene, or eye contact as I was unable to physically see the patient on the phone.  Vital signs not available since interview conducted virtually.    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 The patient was provided an opportunity to ask questions and all were answered. The patient agreed with the plan and demonstrated an understanding of the instructions.   The patient was advised to call back or seek an in-person evaluation if the symptoms worsen or if the condition fails to improve as anticipated.  I provided 30 minutes of non-face-to-face time during this encounter.   Charlcie Cradle, MD

## 2019-05-25 NOTE — Progress Notes (Deleted)
Cardiology Office Note:    Date:  05/25/2019   ID:  Abigail Sharp, DOB 11-29-1965, MRN [id number]  PCP:  Pete Glatter, MD (Inactive)  Cardiologist:  Lesleigh Noe, MD   Referring MD: No ref. provider found   No chief complaint on file.   History of Present Illness:    Abigail Sharp is a 53 y.o. female with a hx of systolic heart failure, hypertension, pre-diabetes, LVEF 25% improved to 55% on medical therapy.  ***  Past Medical History:  Diagnosis Date  . Anxiety   . Chronic systolic heart failure (HCC)   . Depression   . HTN (hypertension)   . Microcytic anemia   . NICM (nonischemic cardiomyopathy) (HCC) 11/2014   EF is 25% improved to 55% in 2015.  . Pre-diabetes     Past Surgical History:  Procedure Laterality Date  . CARDIAC CATHETERIZATION N/A 11/07/2014   Procedure: Right/Left Heart Cath and Coronary Angiography;  Surgeon: Runell Gess, MD;  Location: [facility] INVASIVE CV LAB;  Service: Cardiovascular;  Laterality: N/A;  . TONSILLECTOMY    . TUBAL LIGATION      Current Medications: No outpatient medications have been marked as taking for the 05/26/19 encounter (Appointment) with Lyn Records, MD.     Allergies:   Sulfa antibiotics   Social History   Socioeconomic History  . Marital status: Single    Spouse name: Not on file  . Number of children: Not on file  . Years of education: Not on file  . Highest education level: Not on file  Occupational History  . Not on file  Tobacco Use  . Smoking status: Never Smoker  . Smokeless tobacco: Never Used  Substance and Sexual Activity  . Alcohol use: No    Alcohol/week: 0.0 standard drinks  . Drug use: No  . Sexual activity: Never  Other Topics Concern  . Not on file  Social History Narrative   Social Hx:   Current living situation: Lives alone in apt in [place]: Abigail [place], [place] by parents.   Raised by parents. Parents let her do whatever she wanted to do while growing up. Both parents now  deceased.    Siblings 1 brother and 2 sisters. Pt is number 3   Schooling- HS grad, faced a lot of bullied due to teen pregnancy at 48. Lived with parents while raising her oldest child but for the youngest she moved out temporarily. Her mom took over raising him at the age of 29 mo.    Employed- last job in 2002. 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 Difficulty of Paying Living Expenses: Not on file  Food Insecurity:   . Worried About Programme researcher, broadcasting/film/video in the Last Year: Not on file  . Ran Out of Food in the Last Year: Not on file  Transportation Needs:   . Lack of Transportation (Medical): Not on file  . Lack of Transportation (Non-Medical): Not on file  Physical Activity:   . Days of Exercise per Week: Not on file  . Minutes of Exercise per Session: Not on file  Stress:   . Feeling of Stress : Not on file  Social Connections:   .  Frequency of Communication with Friends and Family: Not on file  . Frequency of Social Gatherings with Friends and Family: Not on file  . Attends Religious Services: Not on file  . Active Member of Clubs or Organizations: Not on file  . Attends Archivist Meetings: Not on file  . Marital Status: Not on file     Family History: The patient's family history includes ADD / ADHD in her son; Diabetes in her father, mother, and sister; Heart disease in her father; Hypertension in her father and mother. There is no history of Alcohol abuse, Anxiety disorder, Bipolar disorder, Depression, or Drug abuse.  ROS:   Please see the history of present illness.    *** All other systems reviewed and are negative.  EKGs/Labs/Other Studies Reviewed:    The following studies were reviewed today: ***  EKG:  EKG ***  Recent Labs: No results found for requested labs within  last 8760 hours.  Recent Lipid Panel    Component Value Date/Time   CHOL 189 08/23/2015 1028   TRIG 290 (H) 08/23/2015 1028   HDL 36 (L) 08/23/2015 1028   CHOLHDL 5.3 (H) 08/23/2015 1028   VLDL 58 (H) 08/23/2015 1028   LDLCALC 95 08/23/2015 1028    Physical Exam:    VS:  There were no vitals taken for this visit.    Wt Readings from Last 3 Encounters:  02/02/19 260 lb (117.9 kg)  02/19/18 285 lb (129.3 kg)  12/06/16 270 lb (122.5 kg)     GEN: ***. No acute distress HEENT: Normal NECK: No JVD. LYMPHATICS: No lymphadenopathy CARDIAC: *** RRR without murmur, gallop, or edema. VASCULAR: *** Normal Pulses. No bruits. RESPIRATORY:  Clear to auscultation without rales, wheezing or rhonchi  ABDOMEN: Soft, non-tender, non-distended, No pulsatile mass, MUSCULOSKELETAL: No deformity  SKIN: Warm and dry NEUROLOGIC:  Alert and oriented x 3 PSYCHIATRIC:  Normal affect   ASSESSMENT:    1. Chronic systolic heart failure ([place])   2. Essential hypertension   3. Morbid obesity ([place])   4. Educated about COVID-19 virus infection    PLAN:    In order of problems listed above:  1. ***   Medication Adjustments/Labs and Tests Ordered: Current medicines are reviewed at length with the patient today.  Concerns regarding medicines are outlined above.  No orders of the defined types were placed in this encounter.  No orders of the defined types were placed in this encounter.   There are no Patient Instructions on file for this visit.   Signed, Sinclair Grooms, MD  05/25/2019 6:17 PM    [place]

## 2019-05-26 ENCOUNTER — Ambulatory Visit: Payer: BLUE CROSS/BLUE SHIELD | Admitting: Interventional Cardiology

## 2019-07-01 ENCOUNTER — Ambulatory Visit (INDEPENDENT_AMBULATORY_CARE_PROVIDER_SITE_OTHER): Payer: BLUE CROSS/BLUE SHIELD | Admitting: Psychiatry

## 2019-07-01 ENCOUNTER — Other Ambulatory Visit: Payer: Self-pay

## 2019-07-01 ENCOUNTER — Encounter (HOSPITAL_COMMUNITY): Payer: Self-pay | Admitting: Psychiatry

## 2019-07-01 DIAGNOSIS — F322 Major depressive disorder, single episode, severe without psychotic features: Secondary | ICD-10-CM | POA: Diagnosis not present

## 2019-07-01 DIAGNOSIS — F332 Major depressive disorder, recurrent severe without psychotic features: Secondary | ICD-10-CM

## 2019-07-01 DIAGNOSIS — F411 Generalized anxiety disorder: Secondary | ICD-10-CM | POA: Diagnosis not present

## 2019-07-01 DIAGNOSIS — F4000 Agoraphobia, unspecified: Secondary | ICD-10-CM

## 2019-07-01 DIAGNOSIS — F422 Mixed obsessional thoughts and acts: Secondary | ICD-10-CM

## 2019-07-01 DIAGNOSIS — G4701 Insomnia due to medical condition: Secondary | ICD-10-CM

## 2019-07-01 MED ORDER — SERTRALINE HCL 100 MG PO TABS: 200.0000 mg | ORAL_TABLET | Freq: Every day | ORAL | 2 refills | Status: DC

## 2019-07-01 MED ORDER — BUSPIRONE HCL 10 MG PO TABS: 20.0000 mg | ORAL_TABLET | Freq: Two times a day (BID) | ORAL | 2 refills | Status: DC

## 2019-07-01 MED ORDER — GABAPENTIN 300 MG PO CAPS: 300.0000 mg | ORAL_CAPSULE | Freq: Two times a day (BID) | ORAL | 2 refills | Status: DC

## 2019-07-01 MED ORDER — DIVALPROEX SODIUM ER 250 MG PO TB24: 750.0000 mg | ORAL_TABLET | Freq: Every day | ORAL | 2 refills | Status: DC

## 2019-07-01 NOTE — Progress Notes (Signed)
Virtual Visit via Telephone Note  I connected with [name] on 07/01/19 at  9:00 AM EST by telephone and verified that I am speaking with the correct person using two identifiers.  Location: Patient: home Provider: office   I discussed the limitations, risks, security and privacy concerns of performing an evaluation and management service by telephone and the availability of in person appointments. I also discussed with the patient that there may be a patient responsible charge related to this service. The patient expressed understanding and agreed to proceed.   HPI:"Doing alright"   Her sons and granddaughter keep her going. Has not seen granddaughter yet. Last saw son 1 yr ago. Bristyl really wants to see her granddaughter.  They keep in touch by FB messenger. She misses her sons and is very proud of them.  She remains depressed (level 7/10 with 10 being the worst). It makes the days hard. It seems to her that her depression is mostly due to one of the other residents in the apartment complex. Aarionna avoids going out because of fear of running into her. Whenever she goes out for errands she feels good. Rifka is lonely. Due to her past she doesn't trust anyone and therefore doesn't have or want friends. Melissa has rare crying spells. She denies SI/HI. Her anxiety remains high. Sleep is poor. Sometimes she won't sleep for 2- 3 days and then will sleep for 12 hrs. She thinks it is because she has nothing to do. Sherryll continue to coupon and reads online.   MSE:  General Appearance: Unable to assess  Eye Contact:  Unable to assess  Speech:  Clear and Coherent and Normal Rate  Volume:  Normal  Mood:  Anxious and Depressed  Affect:  Congruent  Thought Process:  Coherent, Linear and Descriptions of Associations: Intact  Orientation:  Full (Time, Place, and Person)  Thought Content:  Paranoid Ideation  Suicidal Thoughts:  No  Homicidal Thoughts:  No  Memory:  Immediate;   Fair  Judgement:   Poor  Insight:  Present  Psychomotor Activity:  Unable to assess  Concentration:  Concentration: Good  Recall:  [employer] of Knowledge:  Fair  Language:  Fair  Akathisia:  Unable to assess  Handed:  Right  AIMS (if indicated):     Assets:  Communication Skills Desire for Improvement Housing  ADL's: Unable to assess  Cognition:  WNL  Sleep:        Assessment and Plan: MDD-single episode, severe with psychotic features; agoraphobia; GAD; OCD; 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  Zoloft 200mg  po qD  Buspar 20mg  po BID  Depakote ER 750mg  po qHS  Neurontin 300mg  po BID  I will avoid treatment with sedatives/hypnotics due to congestive heart failure  Natelie continues to refuse therapy    Follow up in 2 months or sooner if needed    I discussed the assessment and treatment plan with the patient. The patient was provided an opportunity to ask questions and all were answered. The patient agreed with the plan and demonstrated an understanding of the instructions.   The patient was advised to call back or seek an in-person evaluation if the symptoms worsen or if the condition fails to improve as anticipated.  I provided 30 minutes of non-face-to-face time during this encounter.   , MD

## 2019-08-06 ENCOUNTER — Other Ambulatory Visit: Payer: Self-pay | Admitting: Interventional Cardiology

## 2019-08-06 MED ORDER — CARVEDILOL 6.25 MG PO TABS: 6.2500 mg | ORAL_TABLET | Freq: Two times a day (BID) | ORAL | 5 refills | Status: DC

## 2019-08-06 MED ORDER — SPIRONOLACTONE 25 MG PO TABS: 25.0000 mg | ORAL_TABLET | Freq: Every day | ORAL | 1 refills | Status: DC

## 2019-08-06 NOTE — Telephone Encounter (Signed)
New message   *STAT* If patient is at the pharmacy, call can be transferred to refill team.   1. Which medications need to be refilled? (please list name of each medication and dose if known) carvedilol (COREG) 6.25 MG tablet spironolactone (ALDACTONE) 25 MG tablet  2. Which pharmacy/location (including street and city if local pharmacy) is medication to be sent to? [id number] - [place], [place] - [address] AT [facility] OF [address]  3. Do they need a 30 day or 90 day supply? 90 supply  Patient is out of medication and needs prescriptions today.

## 2019-08-19 ENCOUNTER — Ambulatory Visit (INDEPENDENT_AMBULATORY_CARE_PROVIDER_SITE_OTHER): Payer: BLUE CROSS/BLUE SHIELD | Admitting: Psychiatry

## 2019-08-19 ENCOUNTER — Encounter (HOSPITAL_COMMUNITY): Payer: Self-pay | Admitting: Psychiatry

## 2019-08-19 ENCOUNTER — Ambulatory Visit: Payer: BLUE CROSS/BLUE SHIELD | Admitting: Interventional Cardiology

## 2019-08-19 ENCOUNTER — Other Ambulatory Visit: Payer: Self-pay

## 2019-08-19 DIAGNOSIS — F4000 Agoraphobia, unspecified: Secondary | ICD-10-CM

## 2019-08-19 DIAGNOSIS — F411 Generalized anxiety disorder: Secondary | ICD-10-CM

## 2019-08-19 DIAGNOSIS — F422 Mixed obsessional thoughts and acts: Secondary | ICD-10-CM | POA: Diagnosis not present

## 2019-08-19 DIAGNOSIS — F332 Major depressive disorder, recurrent severe without psychotic features: Secondary | ICD-10-CM

## 2019-08-19 MED ORDER — DIVALPROEX SODIUM ER 250 MG PO TB24: 750.0000 mg | ORAL_TABLET | Freq: Every day | ORAL | 2 refills | Status: DC

## 2019-08-19 MED ORDER — SERTRALINE HCL 100 MG PO TABS: 200.0000 mg | ORAL_TABLET | Freq: Every day | ORAL | 2 refills | Status: DC

## 2019-08-19 MED ORDER — BUSPIRONE HCL 10 MG PO TABS: 20.0000 mg | ORAL_TABLET | Freq: Two times a day (BID) | ORAL | 2 refills | Status: DC

## 2019-08-19 MED ORDER — GABAPENTIN 300 MG PO CAPS: 300.0000 mg | ORAL_CAPSULE | Freq: Two times a day (BID) | ORAL | 2 refills | Status: DC

## 2019-08-19 NOTE — Progress Notes (Signed)
Virtual Visit via Telephone Note  I connected with Abigail Sharp on 08/19/19 at 11:00 AM EDT by telephone and verified that I am speaking with the correct person using two identifiers.  Location: Patient: home Provider: office   I discussed the limitations, risks, security and privacy concerns of performing an evaluation and management service by telephone and the availability of in person appointments. I also discussed with the patient that there may be a patient responsible charge related to this service. The patient expressed understanding and agreed to proceed.   History of Present Illness:  "Everything is going ok this morning. I feel different this morning, like I slept well. I am trying not to worry because my son has my back". She texted with son yesterday and he said he would put some money in her bank account. It made her feel good to talk to him and mine that he was trying to take care of her. As a result she is feeling good today. Her depression seems to be a little better. She stays in her apartment to decrease her anxiety. She wishes that she could go out more. The increase in Depakote to 750mg  helps her sleep better and has improved her depression. Her anxiety is not as intense but she still has racing thoughts. It continues to take a lot of effort to get herself outside. When she does go out she likes it.      Observations/Objective:  General Appearance: unable to assess  Eye Contact:  unable to assess  Speech:  Clear and Coherent and Normal Rate  Volume:  Normal  Mood:  Anxious and Depressed  Affect:  Congruent and brighter and calmer than at previous visits  Thought Process:  Coherent and Descriptions of Associations: Intact  Orientation:  Full (Time, Place, and Person)  Thought Content:  Logical  Suicidal Thoughts:  No  Homicidal Thoughts:  No  Memory:  Immediate;   Good  Judgement:  Poor  Insight:  Shallow  Psychomotor Activity: unable to assess  Concentration:   Concentration: Fair  Recall:  Fair  Fund of Knowledge:  Good  Language:  Good  Akathisia:  unable to assess  Handed:  Right  AIMS (if indicated):     Assets:  Communication Skills Desire for Improvement Housing  ADL's:  unable to assess  Cognition:  WNL  Sleep:        I reviewed the information below on 08/19/2019 and have updated it Assessment and Plan:  MDD-single episode, severe with psychotic features; agoraphobia; GAD; OCD; 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  She does not have transportation and is unable to get her labs done.   Zoloft 200mg  po qD   Buspar 20mg  po BID   Depakote ER 750mg  po qHS   Neurontin 300mg  po BID   I will avoid treatment with sedatives/hypnotics due to congestive heart failure    Follow Up Instructions: In 2 months or sooner if needed   I discussed the assessment and treatment plan with the patient. The patient was provided an opportunity to ask questions and all were answered. The patient agreed with the plan and demonstrated an understanding of the instructions.   The patient was advised to call back or seek an in-person evaluation if the symptoms worsen or if the condition fails to improve as anticipated.  I provided 20 minutes of  non-face-to-face time during this encounter.   Charlcie Cradle, MD

## 2019-09-09 ENCOUNTER — Ambulatory Visit: Payer: BLUE CROSS/BLUE SHIELD | Admitting: Internal Medicine

## 2019-10-05 NOTE — Progress Notes (Deleted)
Cardiology Office Note:    Date:  10/05/2019   ID:  Abigail Sharp, DOB Jan 27, 1966, MRN [id number]  PCP:  Maren Reamer, MD (Inactive)  Cardiologist:  Sinclair Grooms, MD   Referring MD: No ref. provider found   No chief complaint on file.   History of Present Illness:    Abigail Sharp is a 54 y.o. female with a hx of systolic heart failure, hypertension, pre-diabetes, LVEF 25% improved to 55% on medical therapy.  ***  Past Medical History:  Diagnosis Date  . Anxiety   . Chronic systolic heart failure ([place])   . Depression   . HTN (hypertension)   . Microcytic anemia   . NICM (nonischemic cardiomyopathy) ([place]) 11/2014   EF is 25% improved to 55% in 2015.  . Pre-diabetes     Past Surgical History:  Procedure Laterality Date  . CARDIAC CATHETERIZATION N/A 11/07/2014   Procedure: Right/Left Heart Cath and Coronary Angiography;  Surgeon: Lorretta Harp, MD;  Location: [facility] CV LAB;  Service: Cardiovascular;  Laterality: N/A;  . TONSILLECTOMY    . TUBAL LIGATION      Current Medications: No outpatient medications have been marked as taking for the 10/06/19 encounter (Appointment) with Belva Crome, MD.     Allergies:   Sulfa antibiotics   Social History   Socioeconomic History  . Marital status: Single    Spouse name: Not on file  . Number of children: Not on file  . Years of education: Not on file  . Highest education level: Not on file  Occupational History  . Not on file  Tobacco Use  . Smoking status: Never Smoker  . Smokeless tobacco: Never Used  Substance and Sexual Activity  . Alcohol use: No    Alcohol/week: 0.0 standard drinks  . Drug use: No  . Sexual activity: Never  Other Topics Concern  . Not on file  Social History Narrative   Social Hx:   Current living situation: Lives alone in apt in [place]: [place], [place] by parents.   Raised by parents. Parents let her do whatever she wanted to do while growing up. Both parents now deceased.     Siblings 1 brother and 2 sisters. Pt is number 3   Schooling- HS grad, faced a lot of bullied due to teen pregnancy at 84. Lived with parents while raising her oldest child but for the youngest she moved out temporarily. Her mom took over raising him at the age of 14 mo.    Employed- last job in 2002. Worked at [employer] for 2.5 yrs but got fired   Married- never employed   Kids- 2 boys   Scientist, research (physical sciences) issues- jail x2 for not paying child support            Social Determinants of Radio broadcast assistant Strain:   . Difficulty of Paying Living Expenses:   Food Insecurity:   . Worried About Charity fundraiser in the Last Year:   . Arboriculturist in the Last Year:   Transportation Needs:   . Film/video editor (Medical):   Marland Kitchen Lack of Transportation (Non-Medical):   Physical Activity:   . Days of Exercise per Week:   . Minutes of Exercise per Session:   Stress:   . Feeling of Stress :   Social Connections:   . Frequency of Communication with Friends and Family:   . Frequency of Social Gatherings with Friends  and Family:   . Attends Religious Services:   . Active Member of Clubs or Organizations:   . Attends Banker Meetings:   Marland Kitchen Marital Status:      Family History: The patient's family history includes ADD / ADHD in her son; Diabetes in her father, mother, and sister; Heart disease in her father; Hypertension in her father and mother. There is no history of Alcohol abuse, Anxiety disorder, Bipolar disorder, Depression, or Drug abuse.  ROS:   Please see the history of present illness.    *** All other systems reviewed and are negative.  EKGs/Labs/Other Studies Reviewed:    The following studies were reviewed today: ***  EKG:  EKG ***  Recent Labs: No results found for requested labs within last 8760 hours.  Recent Lipid Panel    Component Value Date/Time   CHOL 189 08/23/2015 1028   TRIG 290 (H) 08/23/2015 1028   HDL 36 (L) 08/23/2015 1028    CHOLHDL 5.3 (H) 08/23/2015 1028   VLDL 58 (H) 08/23/2015 1028   LDLCALC 95 08/23/2015 1028    Physical Exam:    VS:  There were no vitals taken for this visit.    Wt Readings from Last 3 Encounters:  02/02/19 260 lb (117.9 kg)  02/19/18 285 lb (129.3 kg)  12/06/16 270 lb (122.5 kg)     GEN: ***. No acute distress HEENT: Normal NECK: No JVD. LYMPHATICS: No lymphadenopathy CARDIAC: *** RRR without murmur, gallop, or edema. VASCULAR: *** Normal Pulses. No bruits. RESPIRATORY:  Clear to auscultation without rales, wheezing or rhonchi  ABDOMEN: Soft, non-tender, non-distended, No pulsatile mass, MUSCULOSKELETAL: No deformity  SKIN: Warm and dry NEUROLOGIC:  Alert and oriented x 3 PSYCHIATRIC:  Normal affect   ASSESSMENT:    1. Chronic systolic heart failure (HCC)   2. Essential hypertension   3. Morbid obesity (HCC)   4. Obsessive-compulsive disorder, unspecified type   5. Educated about COVID-19 virus infection    PLAN:    In order of problems listed above:  1. ***   Medication Adjustments/Labs and Tests Ordered: Current medicines are reviewed at length with the patient today.  Concerns regarding medicines are outlined above.  No orders of the defined types were placed in this encounter.  No orders of the defined types were placed in this encounter.   There are no Patient Instructions on file for this visit.   Signed, Lesleigh Noe, MD  10/05/2019 6:29 PM    [facility]

## 2019-10-06 ENCOUNTER — Telehealth: Payer: Self-pay | Admitting: Interventional Cardiology

## 2019-10-06 ENCOUNTER — Ambulatory Visit: Payer: BLUE CROSS/BLUE SHIELD | Admitting: Interventional Cardiology

## 2019-10-06 NOTE — Telephone Encounter (Signed)
New Message  Patient is calling in, 30 minutes late to her appointment. Checked with front desk and nurse to see if patient can be seen, was instructed to reschedule the appointment. Patient refusing to reschedule, requests to speak with Victorino Dike. Transferred call to [facility].

## 2019-10-14 ENCOUNTER — Telehealth (INDEPENDENT_AMBULATORY_CARE_PROVIDER_SITE_OTHER): Payer: BLUE CROSS/BLUE SHIELD | Admitting: Psychiatry

## 2019-10-14 ENCOUNTER — Other Ambulatory Visit: Payer: Self-pay

## 2019-10-14 DIAGNOSIS — F422 Mixed obsessional thoughts and acts: Secondary | ICD-10-CM

## 2019-10-14 DIAGNOSIS — F411 Generalized anxiety disorder: Secondary | ICD-10-CM | POA: Diagnosis not present

## 2019-10-14 DIAGNOSIS — F332 Major depressive disorder, recurrent severe without psychotic features: Secondary | ICD-10-CM | POA: Diagnosis not present

## 2019-10-14 DIAGNOSIS — F4000 Agoraphobia, unspecified: Secondary | ICD-10-CM

## 2019-10-14 MED ORDER — BUSPIRONE HCL 10 MG PO TABS: 20.0000 mg | ORAL_TABLET | Freq: Two times a day (BID) | ORAL | 2 refills | Status: DC

## 2019-10-14 MED ORDER — GABAPENTIN 300 MG PO CAPS: 300.0000 mg | ORAL_CAPSULE | Freq: Two times a day (BID) | ORAL | 2 refills | Status: DC

## 2019-10-14 MED ORDER — DIVALPROEX SODIUM ER 250 MG PO TB24: 750.0000 mg | ORAL_TABLET | Freq: Every day | ORAL | 2 refills | Status: DC

## 2019-10-14 MED ORDER — SERTRALINE HCL 100 MG PO TABS: 200.0000 mg | ORAL_TABLET | Freq: Every day | ORAL | 2 refills | Status: DC

## 2019-10-14 NOTE — Progress Notes (Signed)
Virtual Visit via Telephone Note  I connected with Abigail Sharp on 10/14/19 at 10:30 AM EDT by telephone and verified that I am speaking with the correct person using two identifiers.  Location: Patient: home Provider: office   I discussed the limitations, risks, security and privacy concerns of performing an evaluation and management service by telephone and the availability of in person appointments. I also discussed with the patient that there may be a patient responsible charge related to this service. The patient expressed understanding and agreed to proceed.   History of Present Illness: "Everything is pretty good right now. I have been trying to change myself with your encouragement". Abigail Sharp got on a dating web site and has met 2 people. She is not sure how to pick. She has not met either in person yet but she wants to. Abigail Sharp wants to be happy and "I want my life back". This has made her depression improve.  Her anxiety is ongoing. She still rarely goes out. Abigail Sharp denies SI/HI.   Observations/Objective:  General Appearance: unable to assess  Eye Contact:  unable to assess  Speech:  Clear and Coherent and Normal Rate  Volume:  Normal  Mood:  Anxious and Depressed  Affect:  Congruent and brighter than previous visits  Thought Process:  Coherent and Descriptions of Associations: Circumstantial  Orientation:  Full (Time, Place, and Person)  Thought Content:  Rumination  Suicidal Thoughts:  No  Homicidal Thoughts:  No  Memory:  Immediate;   Good  Judgement:  Poor  Insight:  Present  Psychomotor Activity: unable to assess  Concentration:  Concentration: Fair  Recall:  [employer] of Knowledge:  Good  Language:  Good  Akathisia:  unable to assess  Handed:  Right  AIMS (if indicated):     Assets:  Communication Skills Desire for Improvement Financial Resources/Insurance Housing  ADL's:  unable to assess  Cognition:  WNL  Sleep:         Assessment and Plan: MDD- single  episode, severe with psychotic features; Agoraphobia; GAD; OCD; 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 Abigail Sharp understanding.   Follow Up Instructions: In 2-3 months or sooner if needed   I discussed the assessment and treatment plan with the patient. The patient was provided an opportunity to ask questions and all were answered. The patient agreed with the plan and demonstrated an understanding of the instructions.   The patient was advised to call back or seek an in-person evaluation if the symptoms worsen or if the condition fails to improve as anticipated.  I provided 30 minutes of non-face-to-face time during this encounter.   SaliCharlcie Cradle

## 2019-12-09 ENCOUNTER — Telehealth (INDEPENDENT_AMBULATORY_CARE_PROVIDER_SITE_OTHER): Payer: BLUE CROSS/BLUE SHIELD | Admitting: Psychiatry

## 2019-12-09 ENCOUNTER — Other Ambulatory Visit: Payer: Self-pay

## 2019-12-09 ENCOUNTER — Encounter (HOSPITAL_COMMUNITY): Payer: Self-pay | Admitting: Psychiatry

## 2019-12-09 DIAGNOSIS — F4000 Agoraphobia, unspecified: Secondary | ICD-10-CM | POA: Diagnosis not present

## 2019-12-09 DIAGNOSIS — F411 Generalized anxiety disorder: Secondary | ICD-10-CM | POA: Diagnosis not present

## 2019-12-09 DIAGNOSIS — F332 Major depressive disorder, recurrent severe without psychotic features: Secondary | ICD-10-CM | POA: Diagnosis not present

## 2019-12-09 DIAGNOSIS — F422 Mixed obsessional thoughts and acts: Secondary | ICD-10-CM | POA: Diagnosis not present

## 2019-12-09 MED ORDER — BUSPIRONE HCL 10 MG PO TABS: 20.0000 mg | ORAL_TABLET | Freq: Two times a day (BID) | ORAL | 1 refills | Status: DC

## 2019-12-09 MED ORDER — GABAPENTIN 300 MG PO CAPS: 300.0000 mg | ORAL_CAPSULE | Freq: Two times a day (BID) | ORAL | 1 refills | Status: DC

## 2019-12-09 MED ORDER — SERTRALINE HCL 100 MG PO TABS: 200.0000 mg | ORAL_TABLET | Freq: Every day | ORAL | 1 refills | Status: DC

## 2019-12-09 MED ORDER — DIVALPROEX SODIUM ER 250 MG PO TB24: 750.0000 mg | ORAL_TABLET | Freq: Every day | ORAL | 1 refills | Status: DC

## 2019-12-09 NOTE — Progress Notes (Signed)
Virtual Visit via Telephone Note  I connected with Abigail Sharp on 12/09/19 at  9:45 AM EDT by telephone and verified that I am speaking with the correct person using two identifiers.  Location: Patient: home Provider: office   I discussed the limitations, risks, security and privacy concerns of performing an evaluation and management service by telephone and the availability of in person appointments. I also discussed with the patient that there may be a patient responsible charge related to this service. The patient expressed understanding and agreed to proceed.   History of Present Illness: Abigail Sharp is continuing to engage in online dating. She is talking to 3 men right and doesn't know who to choose. She has not met any in person. The previous person she was talking to has ended when he stopped talking to her. Her son pointed out that she was being used. Abigail Sharp gets very focused on the people she is talking to and it causes her a lot of anxiety. The depression is not as bad as it was. She still has some bad days. Abigail Sharp has not been leaving her appointment. She is working on making friends online. She denies SI/HI.    Observations/Objective:  General Appearance: unable to assess  Eye Contact:  unable to assess  Speech:  Clear and Coherent and Slow  Volume:  Normal  Mood:  Anxious and Depressed  Affect:  Congruent  Thought Process:  Coherent and Descriptions of Associations: Circumstantial  Orientation:  Full (Time, Place, and Person)  Thought Content:  Rumination  Suicidal Thoughts:  No  Homicidal Thoughts:  No  Memory:  Immediate;   Good  Judgement:  Poor  Insight:  Present  Psychomotor Activity: unable to assess  Concentration:  Concentration: Good  Recall:  Good  Fund of Knowledge:  Good  Language:  Good  Akathisia:  unable to assess  Handed:  Right  AIMS (if indicated):     Assets:  Communication Skills Desire for Improvement Financial Resources/Insurance Housing  ADL's:   unable to assess  Cognition:  WNL  Sleep:         I reviewed the information below on 12/09/19 and have updated it Assessment and Plan:  MDD- single episode, severe with psychotic features; Agoraphobia; GAD; OCD; 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 CaroDaejahbalized understanding.     Follow Up Instructions: In 2-3 months or sooner if needed   I discussed the assessment and treatment plan with the patient. The patient was provided an opportunity to ask questions and all were answered. The patient agreed with the plan and demonstrated an understanding of the instructions.   The patient was advised to call back or seek an in-person evaluation if the symptoms worsen or if the condition fails to improve as anticipated.  I provided 20 minutes of non-face-to-face time during this encounter.   SaliCharlcie Cradle

## 2020-01-11 NOTE — Progress Notes (Signed)
Cardiology Office Note:    Date:  01/12/2020   ID:  [name], DOB [birth year], MRN [id number]  PCP:  Pete Glatter, MD (Inactive)  Cardiologist:  Lesleigh Noe, MD   Referring MD: No ref. provider found   Chief Complaint  Patient presents with  . Congestive Heart Failure    History of Present Illness:    Abigail Sharp is a 54 y.o. female with a hx of systolic heart failure, hypertension, pre-diabetes, LVEF 25% improved to 55% on medical therapy.  She is not short of breath.  She sleeps on her stomach.  There is no orthopnea.  She has not had lower extremity swelling.  She does have dyspnea on exertion.  She is very sedentary and because of the COVID-19 pandemic, rarely comes out of her house.  She has not had chest pain or syncope.  . No recent LV assessment.  In 2016 we documented improvement in LV EF from 25% to 55%.  It has not been checked since that time.  Past Medical History:  Diagnosis Date  . Anxiety   . Chronic systolic heart failure (HCC)   . Depression   . HTN (hypertension)   . Microcytic anemia   . NICM (nonischemic cardiomyopathy) (HCC) 11/2014   EF is 25% improved to 55% in 2015.  . Pre-diabetes     Past Surgical History:  Procedure Laterality Date  . CARDIAC CATHETERIZATION N/A 11/07/2014   Procedure: Right/Left Heart Cath and Coronary Angiography;  Surgeon: Runell Gess, MD;  Location: [facility] INVASIVE CV LAB;  Service: Cardiovascular;  Laterality: N/A;  . TONSILLECTOMY    . TUBAL LIGATION      Current Medications: Current Meds  Medication Sig  . aspirin 81 MG EC tablet Take 1 tablet (81 mg total) by mouth daily.  . busPIRone (BUSPAR) 10 MG tablet Take 2 tablets (20 mg total) by mouth 2 (two) times daily.  . carvedilol (COREG) 6.25 MG tablet Take 1 tablet (6.25 mg total) by mouth 2 (two) times daily with a meal.  . divalproex (DEPAKOTE ER) 250 MG 24 hr tablet Take 3 tablets (750 mg total) by mouth at bedtime.  . gabapentin (NEURONTIN) 300  MG capsule Take 1 capsule (300 mg total) by mouth 2 (two) times daily.  . sertraline (ZOLOFT) 100 MG tablet Take 2 tablets (200 mg total) by mouth daily.  Marland Kitchen spironolactone (ALDACTONE) 25 MG tablet Take 1 tablet (25 mg total) by mouth daily.     Allergies:   Sulfa antibiotics   Social History   Socioeconomic History  . Marital status: Single    Spouse name: Not on file  . Number of children: Not on file  . Years of education: Not on file  . Highest education level: Not on file  Occupational History  . Not on file  Tobacco Use  . Smoking status: Never Smoker  . Smokeless tobacco: Never Used  Vaping Use  . Vaping Use: Never used  Substance and Sexual Activity  . Alcohol use: No    Alcohol/week: 0.0 standard drinks  . Drug use: No  . Sexual activity: Never  Other Topics Concern  . Not on file  Social History Narrative   Social Hx:   Current living situation: Lives alone in apt in [place]: [place], [place] by parents.   Raised by parents. Parents let her do whatever she wanted to do while growing up. Both parents now deceased.    Siblings 1 brother and  2 sisters. Pt is number 3   Schooling- HS grad, faced a lot of bullied due to teen pregnancy at 33. Lived with parents while raising her oldest child but for the youngest she moved out temporarily. Her mom took over raising him at the age of 53 mo.    Employed- last job in 2002. 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 Difficulty of Paying Living Expenses:   Food Insecurity:   . Worried About Programme researcher, broadcasting/film/video in the Last Year:   . Barista in the Last Year:   Transportation Needs:   . Freight forwarder (Medical):   Marland Kitchen Lack of Transportation (Non-Medical):   Physical Activity:   . Days of Exercise per Week:   . Minutes of Exercise per  Session:   Stress:   . Feeling of Stress :   Social Connections:   . Frequency of Communication with Friends and Family:   . Frequency of Social Gatherings with Friends and Family:   . Attends Religious Services:   . Active Member of Clubs or Organizations:   . Attends Banker Meetings:   Marland Kitchen Marital Status:      Family History: The patient's family history includes ADD / ADHD in her son; Diabetes in her father, mother, and sister; Heart disease in her father; Hypertension in her father and mother. There is no history of Alcohol abuse, Anxiety disorder, Bipolar disorder, Depression, or Drug abuse.  ROS:   Please see the history of present illness.    Obsessive-compulsive personality.  Pretty much a loner.  She lives alone.  All other systems reviewed and are negative.  EKGs/Labs/Other Studies Reviewed:    The following studies were reviewed today: No new data   EKG:  EKG sinus tachycardia at 104 bpm.  Otherwise normal.  Recent Labs: No results found for requested labs within last 8760 hours.  Recent Lipid Panel    Component Value Date/Time   CHOL 189 08/23/2015 1028   TRIG 290 (H) 08/23/2015 1028   HDL 36 (L) 08/23/2015 1028   CHOLHDL 5.3 (H) 08/23/2015 1028   VLDL 58 (H) 08/23/2015 1028   LDLCALC 95 08/23/2015 1028    Physical Exam:    VS:  BP (!) 128/92   Pulse (!) 104   Ht 5\' 7"  (1.702 m)   Wt 254 lb (115.2 kg)   BMI 39.78 kg/m     Wt Readings from Last 3 Encounters:  01/12/20 254 lb (115.2 kg)  02/02/19 260 lb (117.9 kg)  02/19/18 285 lb (129.3 kg)     GEN: Obese. No acute distress HEENT: Normal NECK: No JVD. LYMPHATICS: No lymphadenopathy CARDIAC:  RRR without murmur, gallop, or edema. VASCULAR:  Normal Pulses. No bruits. RESPIRATORY:  Clear to auscultation without rales, wheezing or rhonchi  ABDOMEN: Soft, non-tender, non-distended, No pulsatile mass, MUSCULOSKELETAL: No deformity  SKIN: Warm and dry NEUROLOGIC:  Alert and oriented x  3 PSYCHIATRIC:  Normal affect   ASSESSMENT:    1. Chronic systolic heart failure (HCC)   2. Essential hypertension   3. Morbid obesity (HCC)   4. Obsessive-compulsive disorder, unspecified type   5. Educated about COVID-19 virus infection    PLAN:  In order of problems listed above:  1. Needs echocardiogram to establish current state.  2D Doppler echocardiogram will be scheduled.  Cardiac exam does not suggest systolic dysfunction and there is no clinical evidence of volume overload. 2. Target blood pressure 130/80 mmHg.  Basic metabolic panel will be obtained today since she is on Aldactone 25 mg daily and carvedilol 6.25 mg twice daily. 3. She is losing weight.  I encourage further weight reduction. 4. Did not discuss. 5. We discussed her risk if infected with COVID-19 delta variant.  She is willing to get a vaccination performed.  Continue to practice mitigation techniques to avoid COVID-19.  Follow-up in 1 year   Medication Adjustments/Labs and Tests Ordered: Current medicines are reviewed at length with the patient today.  Concerns regarding medicines are outlined above.  Orders Placed This Encounter  Procedures  . EKG 12-Lead   No orders of the defined types were placed in this encounter.   There are no Patient Instructions on file for this visit.   Signed, Lesleigh Noe, MD  01/12/2020 4:05 PM    [facility]

## 2020-01-12 ENCOUNTER — Encounter: Payer: Self-pay | Admitting: Interventional Cardiology

## 2020-01-12 ENCOUNTER — Ambulatory Visit (INDEPENDENT_AMBULATORY_CARE_PROVIDER_SITE_OTHER): Payer: BLUE CROSS/BLUE SHIELD | Admitting: Interventional Cardiology

## 2020-01-12 ENCOUNTER — Other Ambulatory Visit: Payer: Self-pay

## 2020-01-12 VITALS — BP 128/92 | HR 104 | Ht 67.0 in | Wt 254.0 lb

## 2020-01-12 DIAGNOSIS — F429 Obsessive-compulsive disorder, unspecified: Secondary | ICD-10-CM | POA: Diagnosis not present

## 2020-01-12 DIAGNOSIS — I10 Essential (primary) hypertension: Secondary | ICD-10-CM | POA: Diagnosis not present

## 2020-01-12 DIAGNOSIS — I5022 Chronic systolic (congestive) heart failure: Secondary | ICD-10-CM | POA: Diagnosis not present

## 2020-01-12 DIAGNOSIS — Z7189 Other specified counseling: Secondary | ICD-10-CM

## 2020-01-12 NOTE — Patient Instructions (Signed)
Medication Instructions:  Your physician recommends that you continue on your current medications as directed. Please refer to the Current Medication list given to you today.  *If you need a refill on your cardiac medications before your next appointment, please call your pharmacy*   Lab Work: BMET today  If you have labs (blood work) drawn today and your tests are completely normal, you will receive your results only by: Marland Kitchen MyChart Message (if you have MyChart) OR . A paper copy in the mail If you have any lab test that is abnormal or we need to change your treatment, we will call you to review the results.   Testing/Procedures: Your physician has requested that you have an echocardiogram. Echocardiography is a painless test that uses sound waves to create images of your heart. It provides your doctor with information about the size and shape of your heart and how well your heart's chambers and valves are working. This procedure takes approximately one hour. There are no restrictions for this procedure.    Follow-Up: At [facility], you and your health needs are our priority.  As part of our continuing mission to provide you with exceptional heart care, we have created designated Provider Care Teams.  These Care Teams include your primary Cardiologist (physician) and Advanced Practice Providers (APPs -  Physician Assistants and Nurse Practitioners) who all work together to provide you with the care you need, when you need it.  We recommend signing up for the patient portal called "MyChart".  Sign up information is provided on this After Visit Summary.  MyChart is used to connect with patients for Virtual Visits (Telemedicine).  Patients are able to view lab/test results, encounter notes, upcoming appointments, etc.  Non-urgent messages can be sent to your provider as well.   To learn more about what you can do with MyChart, go to [email or web address].    Your next appointment:   12  month(s)  The format for your next appointment:   In Person  Provider:   You may see Lesleigh Noe, MD or one of the following Advanced Practice Providers on your designated Care Team:    Norma Fredrickson, NP  Nada Boozer, NP  Georgie Chard, NP    Other Instructions  Covid vaccine information  [facility]- go to [email or web address] and click "Get a Covid 19 Vaccine" and then click schedule.  You can also call your local [facility].  [facility]- go to [email or web address] and click schedule covid 19 vaccine.  You can also call [facility] for assistance.  [facility]- call [facility] to see if appointment is required.

## 2020-01-13 LAB — BASIC METABOLIC PANEL
BUN/Creatinine Ratio: 27 — ABNORMAL HIGH (ref 9–23)
BUN: 34 mg/dL — ABNORMAL HIGH (ref 6–24)
CO2: 23 mmol/L (ref 20–29)
Calcium: 10 mg/dL (ref 8.7–10.2)
Chloride: 101 mmol/L (ref 96–106)
Creatinine, Ser: 1.28 mg/dL — ABNORMAL HIGH (ref 0.57–1.00)
GFR calc Af Amer: 55 mL/min/{1.73_m2} — ABNORMAL LOW (ref >59)
GFR calc non Af Amer: 48 mL/min/{1.73_m2} — ABNORMAL LOW (ref >59)
Glucose: 87 mg/dL (ref 65–99)
Potassium: 4.8 mmol/L (ref 3.5–5.2)
Sodium: 140 mmol/L (ref 134–144)

## 2020-01-14 ENCOUNTER — Telehealth: Payer: Self-pay | Admitting: Interventional Cardiology

## 2020-01-14 ENCOUNTER — Telehealth: Payer: Self-pay

## 2020-01-14 DIAGNOSIS — I10 Essential (primary) hypertension: Secondary | ICD-10-CM

## 2020-01-14 MED ORDER — SPIRONOLACTONE 25 MG PO TABS
12.5000 mg | ORAL_TABLET | Freq: Every day | ORAL | 3 refills | Status: DC
Start: 2020-01-14 — End: 2020-05-12

## 2020-01-14 NOTE — Telephone Encounter (Signed)
Patient returning call for lab results. 

## 2020-01-14 NOTE — Telephone Encounter (Signed)
The patient has been notified of the result and verbalized understanding.  All questions (if any) were answered. Theresia Majors, RN 01/14/2020 5:03 PM  Patient will decrease spironolactone to 12.5 mg daily. She states that she will call back on Monday to schedule repeat lab work.

## 2020-01-14 NOTE — Telephone Encounter (Signed)
-----   Message from Lyn Records, MD sent at 01/13/2020 10:29 PM EDT ----- Let the patient know there is mild stress on kidneys. Decrease the aldactone to 12.5 mg daily.BMET in 6 weeeks A copy will be sent to Pete Glatter, MD (Inactive)

## 2020-01-18 NOTE — Telephone Encounter (Signed)
Spoke with pt and reviewed recommendations from labs.  Pt states medication refill was sent in incorrectly because insurance denied it.  Assured pt prescription was sent in correctly.  Advised denial is likely due to the fact that she just picked up a 30 day supply of the 25mg  tablets after she seen . Advised to take a half tablet of the pills she already has.  Pt still unsure of date she can come for labs and echo but will call back to schedule.  Pt appreciative for call back.

## 2020-01-18 NOTE — Telephone Encounter (Signed)
Patient is calling to follow up in regards to spironolactone (ALDACTONE) 25 MG tablet medication changes. She states she is requesting clarification.

## 2020-02-11 ENCOUNTER — Encounter: Payer: Self-pay | Admitting: *Deleted

## 2020-02-11 ENCOUNTER — Other Ambulatory Visit: Payer: Self-pay | Admitting: *Deleted

## 2020-02-11 DIAGNOSIS — I5022 Chronic systolic (congestive) heart failure: Secondary | ICD-10-CM

## 2020-02-11 NOTE — Telephone Encounter (Signed)
Attempted to contact pt but when you dial the number it says the call cannot be completed as dialed at this time, try again later.  Tried from multiple phones with no luck.  Called son, on [place], and left message for him to contact pt and have her call to schedule these tests.  Letter sent to pt.

## 2020-02-17 ENCOUNTER — Other Ambulatory Visit: Payer: Self-pay

## 2020-02-17 ENCOUNTER — Telehealth (HOSPITAL_COMMUNITY): Payer: BLUE CROSS/BLUE SHIELD | Admitting: Psychiatry

## 2020-02-17 DIAGNOSIS — F411 Generalized anxiety disorder: Secondary | ICD-10-CM

## 2020-02-17 DIAGNOSIS — F4000 Agoraphobia, unspecified: Secondary | ICD-10-CM

## 2020-02-17 DIAGNOSIS — F332 Major depressive disorder, recurrent severe without psychotic features: Secondary | ICD-10-CM

## 2020-02-17 DIAGNOSIS — F422 Mixed obsessional thoughts and acts: Secondary | ICD-10-CM

## 2020-02-17 NOTE — Progress Notes (Signed)
Virtual Visit via Telephone Note  I connected with Abigail Sharp on 02/17/20 at  3:30 PM EDT by telephone and verified that I am speaking with the correct person using two identifiers.  Location: Patient: home Provider: office   I discussed the limitations, risks, security and privacy concerns of performing an evaluation and management service by telephone and the availability of in person appointments. I also discussed with the patient that there may be a patient responsible charge related to this service. The patient expressed understanding and agreed to proceed.   History of Present Illness: "I still feel the same". She is trying to make some friends and thinks she has a boyfriend now.  She has been on a dating website. Her depression is a little better now that she is socializing more. She has not left her apartment in a long while. Her anxiety remains high. She denies SI/HI.    Observations/Objective:  General Appearance: unable to assess  Eye Contact:  unable to assess  Speech:  Clear and Coherent and Normal Rate  Volume:  Normal  Mood:  Anxious and Depressed  Affect:  Congruent  Thought Process:  Coherent, Linear, and Descriptions of Associations: Intact  Orientation:  Full (Time, Place, and Person)  Thought Content:  Logical  Suicidal Thoughts:  No  Homicidal Thoughts:  No  Memory:  Immediate;   Good  Judgement:  Good  Insight:  Good  Psychomotor Activity: unable to assess  Concentration:  Concentration: Good  Recall:  Good  Fund of Knowledge:  Good  Language:  Good  Akathisia:  unable to assess  Handed:  unable to assess  AIMS (if indicated):     Assets:  Communication Skills Desire for Improvement Financial Resources/Insurance Housing Resilience Talents/Skills Vocational/Educational  ADL's:  unable to assess  Cognition:  WNL  Sleep:        Assessment and Plan:MDD- single episode, severe with psychotic features; Agoraphobia; GAD; OCD; Insomnia due to  medical conditions   Zoloft 200mg  po qD   Buspar 20mg  po BID   Depakote ER 750mg  po qHS   Neurontin 300mg  po BID   I will avoid treatment with sedative/hypnotics due to CHF   We talked in detail about potential risks of online dating. Abigail Sharp verbalized understanding.   Referred for therapy  Follow Up Instructions: In 2-3 months or sooner if needed   I discussed the assessment and treatment plan with the patient. The patient was provided an opportunity to ask questions and all were answered. The patient agreed with the plan and demonstrated an understanding of the instructions.   The patient was advised to call back or seek an in-person evaluation if the symptoms worsen or if the condition fails to improve as anticipated.  I provided 20 minutes of non-face-to-face time during this encounter.   , MD

## 2020-02-24 ENCOUNTER — Encounter (HOSPITAL_COMMUNITY): Payer: Self-pay | Admitting: Interventional Cardiology

## 2020-03-16 ENCOUNTER — Other Ambulatory Visit: Payer: BLUE CROSS/BLUE SHIELD

## 2020-03-16 ENCOUNTER — Other Ambulatory Visit (HOSPITAL_COMMUNITY): Payer: BLUE CROSS/BLUE SHIELD

## 2020-04-05 ENCOUNTER — Other Ambulatory Visit: Payer: Self-pay

## 2020-04-05 ENCOUNTER — Encounter (HOSPITAL_COMMUNITY): Payer: Self-pay | Admitting: Cardiology

## 2020-04-05 ENCOUNTER — Other Ambulatory Visit: Payer: BLUE CROSS/BLUE SHIELD | Admitting: *Deleted

## 2020-04-05 ENCOUNTER — Other Ambulatory Visit (HOSPITAL_COMMUNITY): Payer: BLUE CROSS/BLUE SHIELD

## 2020-04-05 DIAGNOSIS — I10 Essential (primary) hypertension: Secondary | ICD-10-CM

## 2020-04-05 LAB — BASIC METABOLIC PANEL
BUN/Creatinine Ratio: 16 (ref 9–23)
BUN: 19 mg/dL (ref 6–24)
CO2: 23 mmol/L (ref 20–29)
Calcium: 9.3 mg/dL (ref 8.7–10.2)
Chloride: 103 mmol/L (ref 96–106)
Creatinine, Ser: 1.21 mg/dL — ABNORMAL HIGH (ref 0.57–1.00)
GFR calc Af Amer: 59 mL/min/{1.73_m2} — ABNORMAL LOW (ref >59)
GFR calc non Af Amer: 51 mL/min/{1.73_m2} — ABNORMAL LOW (ref >59)
Glucose: 81 mg/dL (ref 65–99)
Potassium: 4.4 mmol/L (ref 3.5–5.2)
Sodium: 140 mmol/L (ref 134–144)

## 2020-04-05 NOTE — Progress Notes (Unsigned)
Patient ID: Abigail Sharp, female   DOB: 03-Aug-1965, 54 y.o.   MRN: [id number]   Verified appointment "no show" status with LeAnne at 9:42am.

## 2020-04-06 ENCOUNTER — Telehealth: Payer: Self-pay | Admitting: Interventional Cardiology

## 2020-04-06 NOTE — Telephone Encounter (Signed)
Pt states she had a missed call from our office but no VM was left.  Unable to locate who may have called.  Advised I am waiting on Dr. [name] to review labs.  Asked pt if she knew about her echo appt on 11/3.  She states she was late getting to the office so they couldn't do the echo and rescheduled it to 11/30.  Advised pt I am unsure of who called and they would call back if needed.  Pt appreciative for call.

## 2020-04-06 NOTE — Telephone Encounter (Signed)
Patient states she is returning a call. May be regarding lab results. 

## 2020-05-02 ENCOUNTER — Telehealth (HOSPITAL_COMMUNITY): Payer: Self-pay | Admitting: Interventional Cardiology

## 2020-05-02 ENCOUNTER — Other Ambulatory Visit (HOSPITAL_COMMUNITY): Payer: BLUE CROSS/BLUE SHIELD

## 2020-05-02 NOTE — Telephone Encounter (Signed)
Just an FYI. We have made several attempts to contact this patient including sending a letter to schedule or reschedule their echocardiogram. We will be removing the patient from the echo WQ.  02/24/20 MAILED LETTER [facility]  02/21/20 [facility] to schedule @ 2:03/[facility]  02/18/2020 [facility] to schedule @ 8:51/[facility]  02/15/2020 called and call would not go thru at this time@      Thank you

## 2020-05-11 ENCOUNTER — Encounter (HOSPITAL_COMMUNITY): Payer: Self-pay | Admitting: Psychiatry

## 2020-05-11 ENCOUNTER — Other Ambulatory Visit: Payer: Self-pay

## 2020-05-11 ENCOUNTER — Telehealth (HOSPITAL_COMMUNITY): Payer: BLUE CROSS/BLUE SHIELD | Admitting: Psychiatry

## 2020-05-11 DIAGNOSIS — F332 Major depressive disorder, recurrent severe without psychotic features: Secondary | ICD-10-CM

## 2020-05-11 DIAGNOSIS — F4000 Agoraphobia, unspecified: Secondary | ICD-10-CM

## 2020-05-11 DIAGNOSIS — F411 Generalized anxiety disorder: Secondary | ICD-10-CM

## 2020-05-11 DIAGNOSIS — F422 Mixed obsessional thoughts and acts: Secondary | ICD-10-CM

## 2020-05-11 MED ORDER — BUSPIRONE HCL 10 MG PO TABS: 20.0000 mg | ORAL_TABLET | Freq: Two times a day (BID) | ORAL | 3 refills | Status: DC

## 2020-05-11 MED ORDER — DIVALPROEX SODIUM ER 250 MG PO TB24: 750.0000 mg | ORAL_TABLET | Freq: Every day | ORAL | 3 refills | Status: DC

## 2020-05-11 MED ORDER — GABAPENTIN 300 MG PO CAPS: 300.0000 mg | ORAL_CAPSULE | Freq: Two times a day (BID) | ORAL | 3 refills | Status: DC

## 2020-05-11 MED ORDER — SERTRALINE HCL 100 MG PO TABS: 200.0000 mg | ORAL_TABLET | Freq: Every day | ORAL | 3 refills | Status: DC

## 2020-05-11 NOTE — Progress Notes (Signed)
Virtual Visit via Telephone Note  I connected with Abigail Sharp on 05/11/20 at  4:00 PM EST by telephone and verified that I am speaking with the correct person using two identifiers.  Location: Patient: home Provider: office   I discussed the limitations, risks, security and privacy concerns of performing an evaluation and management service by telephone and the availability of in person appointments. I also discussed with the patient that there may be a patient responsible charge related to this service. The patient expressed understanding and agreed to proceed.   History of Present Illness: Depression and anxiety  are better. She is still talking to a few men on the dating site. It makes her feel good. She denies SI/HI. Abigail Sharp still doesn't leave her apartment much.    Observations/Objective:  General Appearance: unable to assess  Eye Contact:  unable to assess  Speech:  Clear and Coherent and Normal Rate  Volume:  Normal  Mood:  Euthymic  Affect:  Full Range  Thought Process:  Goal Directed, Linear, and Descriptions of Associations: Intact  Orientation:  Full (Time, Place, and Person)  Thought Content:  Logical  Suicidal Thoughts:  No  Homicidal Thoughts:  No  Memory:  Immediate;   Good  Judgement:  Fair  Insight:  Fair  Psychomotor Activity: unable to assess  Concentration:  Concentration: Good  Recall:  Good  Fund of Knowledge:  Good  Language:  Good  Akathisia:  unable to assess  Handed:  Right  AIMS (if indicated):     Assets:  Communication Skills Desire for Improvement Financial Resources/Insurance Housing Resilience Talents/Skills Transportation Vocational/Educational  ADL's:  unable to assess  Cognition:  WNL  Sleep:         Assessment and Plan: 1. GAD (generalized anxiety disorder) - busPIRone (BUSPAR) 10 MG tablet; Take 2 tablets (20 mg total) by mouth 2 (two) times daily.  Dispense: 120 tablet; Refill: 3 - gabapentin (NEURONTIN) 300 MG capsule;  Take 1 capsule (300 mg total) by mouth 2 (two) times daily.  Dispense: 60 capsule; Refill: 3 - sertraline (ZOLOFT) 100 MG tablet; Take 2 tablets (200 mg total) by mouth daily.  Dispense: 60 tablet; Refill: 3  2. Severe episode of recurrent major depressive disorder, without psychotic features (HCC) - busPIRone (BUSPAR) 10 MG tablet; Take 2 tablets (20 mg total) by mouth 2 (two) times daily.  Dispense: 120 tablet; Refill: 3 - divalproex (DEPAKOTE ER) 250 MG 24 hr tablet; Take 3 tablets (750 mg total) by mouth at bedtime.  Dispense: 90 tablet; Refill: 3 - sertraline (ZOLOFT) 100 MG tablet; Take 2 tablets (200 mg total) by mouth daily.  Dispense: 60 tablet; Refill: 3  3. Agoraphobia - busPIRone (BUSPAR) 10 MG tablet; Take 2 tablets (20 mg total) by mouth 2 (two) times daily.  Dispense: 120 tablet; Refill: 3 - sertraline (ZOLOFT) 100 MG tablet; Take 2 tablets (200 mg total) by mouth daily.  Dispense: 60 tablet; Refill: 3  4. Mixed obsessional thoughts and acts - sertraline (ZOLOFT) 100 MG tablet; Take 2 tablets (200 mg total) by mouth daily.  Dispense: 60 tablet; Refill: 3    Follow Up Instructions: In 2-3 months or sooner if needed   I discussed the assessment and treatment plan with the patient. The patient was provided an opportunity to ask questions and all were answered. The patient agreed with the plan and demonstrated an understanding of the instructions.   The patient was advised to call back or seek an in-person evaluation if  the symptoms worsen or if the condition fails to improve as anticipated.  I provided 20 minutes of non-face-to-face time during this encounter.   Oletta Darter, MD

## 2020-05-12 ENCOUNTER — Telehealth: Payer: Self-pay | Admitting: Interventional Cardiology

## 2020-05-12 MED ORDER — CARVEDILOL 6.25 MG PO TABS
6.2500 mg | ORAL_TABLET | Freq: Two times a day (BID) | ORAL | 2 refills | Status: DC
Start: 2020-05-12 — End: 2021-05-14

## 2020-05-12 MED ORDER — SPIRONOLACTONE 25 MG PO TABS
12.5000 mg | ORAL_TABLET | Freq: Every day | ORAL | 2 refills | Status: DC
Start: 2020-05-12 — End: 2021-09-07

## 2020-05-12 NOTE — Telephone Encounter (Signed)
Pt's medications were sent to pt's pharmacy as requested. Confirmation received.  

## 2020-05-12 NOTE — Telephone Encounter (Signed)
°*  STAT* If patient is at the pharmacy, call can be transferred to refill team.   1. Which medications need to be refilled? (please list name of each medication and dose if known) carvedilol (COREG) 6.25 MG tablet and spironolactone (ALDACTONE) 25 MG tablet  2. Which pharmacy/location (including street and city if local pharmacy) is medication to be sent to? [id number] - [place], [place] - [address] AT [facility] OF [address]  3. Do they need a 30 day or 90 day supply? 90

## 2020-06-05 ENCOUNTER — Other Ambulatory Visit (HOSPITAL_COMMUNITY): Payer: BLUE CROSS/BLUE SHIELD

## 2020-07-03 ENCOUNTER — Other Ambulatory Visit (HOSPITAL_COMMUNITY): Payer: BLUE CROSS/BLUE SHIELD

## 2020-07-28 ENCOUNTER — Ambulatory Visit (HOSPITAL_COMMUNITY): Payer: Self-pay

## 2020-08-10 ENCOUNTER — Other Ambulatory Visit: Payer: Self-pay

## 2020-08-10 ENCOUNTER — Telehealth (INDEPENDENT_AMBULATORY_CARE_PROVIDER_SITE_OTHER): Payer: Self-pay | Admitting: Psychiatry

## 2020-08-10 DIAGNOSIS — F422 Mixed obsessional thoughts and acts: Secondary | ICD-10-CM

## 2020-08-10 DIAGNOSIS — F4000 Agoraphobia, unspecified: Secondary | ICD-10-CM

## 2020-08-10 DIAGNOSIS — F332 Major depressive disorder, recurrent severe without psychotic features: Secondary | ICD-10-CM

## 2020-08-10 DIAGNOSIS — Z79899 Other long term (current) drug therapy: Secondary | ICD-10-CM

## 2020-08-10 DIAGNOSIS — F411 Generalized anxiety disorder: Secondary | ICD-10-CM

## 2020-08-10 MED ORDER — SERTRALINE HCL 100 MG PO TABS: 200.0000 mg | ORAL_TABLET | Freq: Every day | ORAL | 2 refills | Status: DC

## 2020-08-10 MED ORDER — GABAPENTIN 300 MG PO CAPS: 300.0000 mg | ORAL_CAPSULE | Freq: Two times a day (BID) | ORAL | 2 refills | Status: DC

## 2020-08-10 MED ORDER — BUSPIRONE HCL 10 MG PO TABS: 20.0000 mg | ORAL_TABLET | Freq: Two times a day (BID) | ORAL | 2 refills | Status: DC

## 2020-08-10 MED ORDER — DIVALPROEX SODIUM ER 250 MG PO TB24: 750.0000 mg | ORAL_TABLET | Freq: Every day | ORAL | 2 refills | Status: DC

## 2020-08-10 NOTE — Progress Notes (Signed)
Virtual Visit via Telephone Note  I connected with Kathlen Mody on 08/10/20 at  3:30 PM EST by telephone and verified that I am speaking with the correct person using two identifiers.  Location: Patient: home Provider: office   I discussed the limitations, risks, security and privacy concerns of performing an evaluation and management service by telephone and the availability of in person appointments. I also discussed with the patient that there may be a patient responsible charge related to this service. The patient expressed understanding and agreed to proceed.   History of Present Illness: Fabiola continues to engage in online dating sites. She is talking to a few people and getting to know them. She has not met any in person even though 2 of the recent guys are pushing for it. Yu is uncomfortable with meeting in person for now. She has a lot of anxiety around the whole dating scene. She still does not go out and prefers to stay in her home. Her depression is better than it was several months ago but she still struggles. Clariece believes that if she found someone to have a good relationship with then her depression would continue to improve. Her sleep is poor. She denies SI/HI.    Observations/Objective:  General Appearance: unable to assess  Eye Contact:  unable to assess  Speech:  Clear and Coherent and Slow  Volume:  Normal  Mood:  Anxious and Depressed  Affect:  Congruent  Thought Process:  Coherent and Descriptions of Associations: Circumstantial  Orientation:  Full (Time, Place, and Person)  Thought Content:  Rumination  Suicidal Thoughts:  No  Homicidal Thoughts:  No  Memory:  Immediate;   Good  Judgement:  Fair  Insight:  Fair  Psychomotor Activity: unable to assess  Concentration:  Concentration: Good  Recall:  Good  Fund of Knowledge:  Good  Language:  Good  Akathisia:  unable to assess  Handed:  Right  AIMS (if indicated):     Assets:  Communication Skills Desire  for Improvement Financial Resources/Insurance Housing Resilience Talents/Skills Vocational/Educational  ADL's:  unable to assess  Cognition:  WNL  Sleep:         Assessment and Plan:  Depression screen [facility] 2/9 08/10/2020 04/17/2016 11/21/2015 08/28/2015  Decreased Interest 0 2 3 3   Down, Depressed, Hopeless 2 2 3 3   PHQ - 2 Score 2 4 6 6   Altered sleeping 3 3 2 2   Tired, decreased energy 3 1 2 1   Change in appetite 0 1 2 3   Feeling bad or failure about yourself  1 1 2 3   Trouble concentrating 1 1 2 1   Moving slowly or fidgety/restless 0 0 2 1  Suicidal thoughts 0 0 0 0  PHQ-9 Score 10 11 18 17   Difficult doing work/chores Somewhat difficult - - -    Flowsheet Row Video Visit from 08/10/2020 in [place] Error: Question 6 not populated     1. GAD (generalized anxiety disorder) - busPIRone (BUSPAR) 10 MG tablet; Take 2 tablets (20 mg total) by mouth 2 (two) times daily.  Dispense: 120 tablet; Refill: 2 - gabapentin (NEURONTIN) 300 MG capsule; Take 1 capsule (300 mg total) by mouth 2 (two) times daily.  Dispense: 60 capsule; Refill: 2 - sertraline (ZOLOFT) 100 MG tablet; Take 2 tablets (200 mg total) by mouth daily.  Dispense: 60 tablet; Refill: 2  2. Severe episode of recurrent major depressive disorder, without psychotic features ([place]) -  busPIRone (BUSPAR) 10 MG tablet; Take 2 tablets (20 mg total) by mouth 2 (two) times daily.  Dispense: 120 tablet; Refill: 2 - divalproex (DEPAKOTE ER) 250 MG 24 hr tablet; Take 3 tablets (750 mg total) by mouth at bedtime.  Dispense: 90 tablet; Refill: 2 - sertraline (ZOLOFT) 100 MG tablet; Take 2 tablets (200 mg total) by mouth daily.  Dispense: 60 tablet; Refill: 2  3. Agoraphobia - busPIRone (BUSPAR) 10 MG tablet; Take 2 tablets (20 mg total) by mouth 2 (two) times daily.  Dispense: 120 tablet; Refill: 2 - sertraline (ZOLOFT) 100 MG tablet; Take 2 tablets (200 mg total) by  mouth daily.  Dispense: 60 tablet; Refill: 2  4. Mixed obsessional thoughts and acts - sertraline (ZOLOFT) 100 MG tablet; Take 2 tablets (200 mg total) by mouth daily.  Dispense: 60 tablet; Refill: 2  5. Encounter for long-term (current) use of medications - Valproic acid level - CBC - Comprehensive metabolic panel      Encouraged Arbie Cookey to get up appointment with PCP  Follow Up Instructions: In 2-3 months or sooner if needed   I discussed the assessment and treatment plan with the patient. The patient was provided an opportunity to ask questions and all were answered. The patient agreed with the plan and demonstrated an understanding of the instructions.   The patient was advised to call back or seek an in-person evaluation if the symptoms worsen or if the condition fails to improve as anticipated.  I provided 25 minutes of non-face-to-face time during this encounter.   Charlcie Cradle, MD

## 2020-08-24 ENCOUNTER — Other Ambulatory Visit (HOSPITAL_COMMUNITY): Payer: Self-pay

## 2020-09-22 ENCOUNTER — Encounter: Payer: Self-pay | Admitting: Cardiology

## 2020-09-22 ENCOUNTER — Encounter (HOSPITAL_COMMUNITY): Payer: Self-pay | Admitting: Interventional Cardiology

## 2020-09-22 ENCOUNTER — Ambulatory Visit (HOSPITAL_COMMUNITY): Payer: Self-pay | Attending: Internal Medicine

## 2020-09-22 NOTE — Progress Notes (Unsigned)
Patient ID: Abigail Sharp, female   DOB: 1966/01/28, 55 y.o.   MRN: [id number]  Verified appointment "no show" status with Aaliyah at 11:41am.

## 2020-09-25 ENCOUNTER — Other Ambulatory Visit (HOSPITAL_COMMUNITY): Payer: Self-pay

## 2020-10-24 ENCOUNTER — Other Ambulatory Visit (HOSPITAL_COMMUNITY): Payer: Self-pay

## 2020-11-02 ENCOUNTER — Other Ambulatory Visit: Payer: Self-pay

## 2020-11-02 ENCOUNTER — Telehealth (HOSPITAL_COMMUNITY): Payer: Self-pay | Admitting: Psychiatry

## 2020-11-21 ENCOUNTER — Other Ambulatory Visit (HOSPITAL_COMMUNITY): Payer: Self-pay

## 2020-11-23 ENCOUNTER — Other Ambulatory Visit: Payer: Self-pay

## 2020-11-23 ENCOUNTER — Telehealth (INDEPENDENT_AMBULATORY_CARE_PROVIDER_SITE_OTHER): Payer: Self-pay | Admitting: Psychiatry

## 2020-11-23 DIAGNOSIS — F422 Mixed obsessional thoughts and acts: Secondary | ICD-10-CM

## 2020-11-23 DIAGNOSIS — F4000 Agoraphobia, unspecified: Secondary | ICD-10-CM

## 2020-11-23 DIAGNOSIS — F411 Generalized anxiety disorder: Secondary | ICD-10-CM

## 2020-11-23 DIAGNOSIS — Z79899 Other long term (current) drug therapy: Secondary | ICD-10-CM

## 2020-11-23 DIAGNOSIS — F332 Major depressive disorder, recurrent severe without psychotic features: Secondary | ICD-10-CM

## 2020-11-23 MED ORDER — GABAPENTIN 300 MG PO CAPS
300.0000 mg | ORAL_CAPSULE | Freq: Two times a day (BID) | ORAL | 2 refills | Status: DC
Start: 2020-11-23 — End: 2021-01-18

## 2020-11-23 MED ORDER — DIVALPROEX SODIUM ER 250 MG PO TB24: 750.0000 mg | ORAL_TABLET | Freq: Every day | ORAL | 2 refills | Status: DC

## 2020-11-23 MED ORDER — BUSPIRONE HCL 10 MG PO TABS
20.0000 mg | ORAL_TABLET | Freq: Two times a day (BID) | ORAL | 2 refills | Status: DC
Start: 2020-11-23 — End: 2021-01-18

## 2020-11-23 MED ORDER — SERTRALINE HCL 100 MG PO TABS: 200.0000 mg | ORAL_TABLET | Freq: Every day | ORAL | 2 refills | Status: DC

## 2020-11-23 NOTE — Progress Notes (Signed)
Virtual Visit via Telephone Note  I connected with Abigail Sharp on 11/23/20 at  9:30 AM EDT by telephone and verified that I am speaking with the correct person using two identifiers.  Location: Patient: home Provider: office   I discussed the limitations, risks, security and privacy concerns of performing an evaluation and management service by telephone and the availability of in person appointments. I also discussed with the patient that there may be a patient responsible charge related to this service. The patient expressed understanding and agreed to proceed.   History of Present Illness: Abigail Sharp shares that she is a little groggy today. Her sleep is broken most nights. Abigail Sharp is able to fall asleep initially and sleeps for 3 hrs. She wakes to go to the bathroom and then is unable to fall asleep. She does nap during the day. She does not feel rested and is tired all the time. Her depression is ok. She is now dating someone she met online. This is someone she met thru a dating website. His name is Sabas Sous. Abigail Sharp shares that he asked her to his girlfriend on May 11th. They have not met in person but talk on the phone often. They talk daily by texting. He travels a lot and manages many hotels. Abigail Sharp is not talking to any other men and she really like him. Abigail Sharp is not comfortable meeting in person yet. She told him about her agoraphobia and he is learning about it and her. He wants to help her. He lives right outside of [place], [place]. She states [name] has not asked her for anything and she has not sent him anything. He helps with her depression but she still feels depressed on/off thru the week. Abigail Sharp is lonely and that plays a big part in her depression. She has trouble focusing and staying on task. She does get down on herself sometimes. Abigail Sharp denies SI/HI or thoughts about death.her anxiety is strong but it is not daily. Noise bothers her. The last time she left her apartment was almost 2 weeks  ago to take out the trash. Before that it was almost 2 months ago. She is getting food thru [employer].She will go to the pharmacy to pick up her meds. [name] does not feel safe walking due to her health. She is not interacting with her neighbors.    Observations/Objective:  General Appearance: unable to assess  Eye Contact:  unable to assess  Speech:  Clear and Coherent and Slow  Volume:  Normal  Mood:  Anxious and Depressed  Affect:  Congruent  Thought Process:  Coherent and Descriptions of Associations: Circumstantial  Orientation:  Full (Time, Place, and Person)  Thought Content:  Paranoid Ideation and Rumination  Suicidal Thoughts:  No  Homicidal Thoughts:  No  Memory:  Immediate;   Good  Judgement:  Fair  Insight:  Fair  Psychomotor Activity: unable to assess  Concentration:  Concentration: Good  Recall:  Good  Fund of Knowledge:  Good  Language:  Good  Akathisia:  unable to assess  Handed:  Right  AIMS (if indicated):     Assets:  Communication Skills Desire for Improvement Financial Resources/Insurance Housing Resilience Talents/Skills Vocational/Educational  ADL's:  unable to assess  Cognition:  WNL  Sleep:        Assessment and Plan: Depression screen Abigail Sharp 2/9 11/23/2020 08/10/2020 04/17/2016 11/21/2015 08/28/2015  Decreased Interest 1 0 2 3 3   Down, Depressed, Hopeless 2 2 2 3 3   PHQ - 2 Score 3  2 4 6 6   Altered sleeping 3 3 3 2 2   Tired, decreased energy 3 3 1 2 1   Change in appetite 0 0 1 2 3   Feeling bad or failure about yourself  1 1 1 2 3   Trouble concentrating 3 1 1 2 1   Moving slowly or fidgety/restless 0 0 0 2 1  Suicidal thoughts 0 0 0 0 0  PHQ-9 Score 13 10 11 18 17   Difficult doing work/chores Extremely dIfficult Somewhat difficult - - -    Flowsheet Row Video Visit from 11/23/2020 in [facility] Video Visit from 08/10/2020 in [place] No Risk  Error: Question 6 not populated      - she is looking for a new PCP  - Abigail Sharp is willing to get her labs drawn  Labs: ordered Depakote level, CBC (platelets), CMP  - plan to increased Depakote if possible, will evaluate labs to determine plan  1. GAD (generalized anxiety disorder) - busPIRone (BUSPAR) 10 MG tablet; Take 2 tablets (20 mg total) by mouth 2 (two) times daily.  Dispense: 120 tablet; Refill: 2 - gabapentin (NEURONTIN) 300 MG capsule; Take 1 capsule (300 mg total) by mouth 2 (two) times daily.  Dispense: 60 capsule; Refill: 2 - sertraline (ZOLOFT) 100 MG tablet; Take 2 tablets (200 mg total) by mouth daily.  Dispense: 60 tablet; Refill: 2  2. Severe episode of recurrent major depressive disorder, without psychotic features ([place]) - busPIRone (BUSPAR) 10 MG tablet; Take 2 tablets (20 mg total) by mouth 2 (two) times daily.  Dispense: 120 tablet; Refill: 2 - divalproex (DEPAKOTE ER) 250 MG 24 hr tablet; Take 3 tablets (750 mg total) by mouth at bedtime.  Dispense: 90 tablet; Refill: 2 - sertraline (ZOLOFT) 100 MG tablet; Take 2 tablets (200 mg total) by mouth daily.  Dispense: 60 tablet; Refill: 2  3. Agoraphobia - busPIRone (BUSPAR) 10 MG tablet; Take 2 tablets (20 mg total) by mouth 2 (two) times daily.  Dispense: 120 tablet; Refill: 2 - sertraline (ZOLOFT) 100 MG tablet; Take 2 tablets (200 mg total) by mouth daily.  Dispense: 60 tablet; Refill: 2  4. Mixed obsessional thoughts and acts - sertraline (ZOLOFT) 100 MG tablet; Take 2 tablets (200 mg total) by mouth daily.  Dispense: 60 tablet; Refill: 2  5. Encounter for long-term (current) use of medications - Valproic acid level - CBC - Comprehensive metabolic panel   Follow Up Instructions: In 2-3 months or sooner if needed   I discussed the assessment and treatment plan with the patient. The patient was provided an opportunity to ask questions and all were answered. The patient agreed with the plan and demonstrated an  understanding of the instructions.   The patient was advised to call back or seek an in-person evaluation if the symptoms worsen or if the condition fails to improve as anticipated.  I provided 24 minutes of non-face-to-face time during this encounter.   Charlcie Cradle, MD

## 2020-12-15 ENCOUNTER — Other Ambulatory Visit: Payer: Self-pay

## 2020-12-15 ENCOUNTER — Ambulatory Visit (HOSPITAL_COMMUNITY): Payer: 59 | Attending: Cardiovascular Disease

## 2020-12-15 DIAGNOSIS — I5022 Chronic systolic (congestive) heart failure: Secondary | ICD-10-CM | POA: Insufficient documentation

## 2020-12-15 LAB — ECHOCARDIOGRAM COMPLETE
Area-P 1/2: 5.13 cm2
S' Lateral: 3.7 cm

## 2021-01-18 ENCOUNTER — Other Ambulatory Visit: Payer: Self-pay

## 2021-01-18 ENCOUNTER — Telehealth (INDEPENDENT_AMBULATORY_CARE_PROVIDER_SITE_OTHER): Payer: 59 | Admitting: Psychiatry

## 2021-01-18 DIAGNOSIS — F422 Mixed obsessional thoughts and acts: Secondary | ICD-10-CM | POA: Diagnosis not present

## 2021-01-18 DIAGNOSIS — F332 Major depressive disorder, recurrent severe without psychotic features: Secondary | ICD-10-CM | POA: Diagnosis not present

## 2021-01-18 DIAGNOSIS — F4000 Agoraphobia, unspecified: Secondary | ICD-10-CM | POA: Diagnosis not present

## 2021-01-18 DIAGNOSIS — F411 Generalized anxiety disorder: Secondary | ICD-10-CM

## 2021-01-18 MED ORDER — BUSPIRONE HCL 10 MG PO TABS: 20.0000 mg | ORAL_TABLET | Freq: Two times a day (BID) | ORAL | 1 refills | Status: DC

## 2021-01-18 MED ORDER — GABAPENTIN 300 MG PO CAPS
300.0000 mg | ORAL_CAPSULE | Freq: Two times a day (BID) | ORAL | 1 refills | Status: DC
Start: 2021-01-18 — End: 2021-03-15

## 2021-01-18 MED ORDER — LAMOTRIGINE 25 MG PO TABS: ORAL_TABLET | ORAL | 1 refills | Status: DC

## 2021-01-18 MED ORDER — SERTRALINE HCL 100 MG PO TABS: 200.0000 mg | ORAL_TABLET | Freq: Every day | ORAL | 1 refills | Status: DC

## 2021-01-18 NOTE — Progress Notes (Signed)
Virtual Visit via Telephone Note  I connected with Abigail Sharp on 01/18/21 at  1:00 PM EDT by telephone and verified that I am speaking with the correct person using two identifiers.  Location: Patient: home Provider: office   I discussed the limitations, risks, security and privacy concerns of performing an evaluation and management service by telephone and the availability of in person appointments. I also discussed with the patient that there may be a patient responsible charge related to this service. The patient expressed understanding and agreed to proceed.   History of Present Illness: Abigail Sharp states everything is the same as at our last visit. She has only left her apartment once since 12/15/20. Her son came by that day and took her to [place]. Since then she has only gone out once to throw out her trash about one week ago. She is still afraid to leave her apartment for any reason. Abigail Sharp is bored and gets tired of being in her apartment. She wishes she could change and get comfortable to leave her apartment. Abigail Sharp gets her grocery thru [employer]. She is still talking to the same man daily. He wants to meet her in person.  Abigail Sharp doesn't feel ready to meet the man she has been talking to for over 3 months. She states her anxiety is a little better since meeting him. She thinks about him a lot and it feels nice. Her sleep is variable. She often naps during the daytime. It takes her a long while to fall asleep due to racing thoughts. Abigail Sharp spends most of her time watching tv. Her depression comes and goes. In the last 2 weeks she has been depressed at least 7 days. On bad days she is very sad and has low motivation. She feels nauseous and spends her time in bed.  Her appetite is better and she is eating at least 2 times a day. She admits to rare thoughts of wishing she was dead. It is usually brief and quick. The last time was over 1 week ago. She does not want to die. It usually occurs when she is  disappointed about something. Abigail Sharp denies SI/HI. She has not found a new PCP yet.    Observations/Objective:  General Appearance: unable to assess  Eye Contact:  unable to assess  Speech:  Clear and Coherent and Normal Rate  Volume:  Normal  Mood:  Anxious and Depressed  Affect:  Congruent  Thought Process:  Coherent and Descriptions of Associations: Circumstantial  Orientation:  Full (Time, Place, and Person)  Thought Content:  Rumination  Suicidal Thoughts:  No  Homicidal Thoughts:  No  Memory:  Immediate;   Good  Judgement:  Good  Insight:  Good  Psychomotor Activity: unable to assess  Concentration:  Concentration: Good  Recall:  Good  Fund of Knowledge:  Good  Language:  Good  Akathisia:  unable to assess  Handed:  unable to assess  AIMS (if indicated):     Assets:  Communication Skills Desire for Improvement Financial Resources/Insurance Housing Talents/Skills Vocational/Educational  ADL's:  unable to assess  Cognition:  WNL  Sleep:        Assessment and Plan: Depression screen [facility] 2/9 01/18/2021 11/23/2020 08/10/2020 04/17/2016 11/21/2015  Decreased Interest 2 1 0 2 3  Down, Depressed, Hopeless 2 2 2 2 3   PHQ - 2 Score 4 3 2 4 6   Altered sleeping 2 3 3 3 2   Tired, decreased energy 3 3 3 1 2   Change in appetite  1 0 0 1 2  Feeling bad or failure about yourself  2 1 1 1 2   Trouble concentrating 2 3 1 1 2   Moving slowly or fidgety/restless 0 0 0 0 2  Suicidal thoughts 1 0 0 0 0  PHQ-9 Score 15 13 10 11 18   Difficult doing work/chores Very difficult Extremely dIfficult Somewhat difficult - -    Flowsheet Row Video [facility] 01/18/2021 [facility] Video Visit from 11/23/2020 in [facility] Video Visit from 08/10/2020 in [facility]  C-[facility] RISK CATEGORY Error: Q3, 4, or 5 should not be populated when Q2 is No No Risk Error: Question 6 not  populated      -d/c Depakote as patient is not going to get lab work done  - start Lamictal 25mg  x14 days then increase to 50mg  po qD  1. GAD (generalized anxiety disorder) - busPIRone (BUSPAR) 10 MG tablet; Take 2 tablets (20 mg total) by mouth 2 (two) times daily.  Dispense: 120 tablet; Refill: 1 - gabapentin (NEURONTIN) 300 MG capsule; Take 1 capsule (300 mg total) by mouth 2 (two) times daily.  Dispense: 60 capsule; Refill: 1 - sertraline (ZOLOFT) 100 MG tablet; Take 2 tablets (200 mg total) by mouth daily.  Dispense: 60 tablet; Refill: 1  2. Severe episode of recurrent major depressive disorder, without psychotic features (HCC) - busPIRone (BUSPAR) 10 MG tablet; Take 2 tablets (20 mg total) by mouth 2 (two) times daily.  Dispense: 120 tablet; Refill: 1 - sertraline (ZOLOFT) 100 MG tablet; Take 2 tablets (200 mg total) by mouth daily.  Dispense: 60 tablet; Refill: 1 - lamoTRIgine (LAMICTAL) 25 MG tablet; Take 1 tablet (25 mg total) by mouth daily for 14 days, THEN 2 tablets (50 mg total) daily.  Dispense: 74 tablet; Refill: 1  3. Agoraphobia - busPIRone (BUSPAR) 10 MG tablet; Take 2 tablets (20 mg total) by mouth 2 (two) times daily.  Dispense: 120 tablet; Refill: 1 - sertraline (ZOLOFT) 100 MG tablet; Take 2 tablets (200 mg total) by mouth daily.  Dispense: 60 tablet; Refill: 1  4. Mixed obsessional thoughts and acts - sertraline (ZOLOFT) 100 MG tablet; Take 2 tablets (200 mg total) by mouth daily.  Dispense: 60 tablet; Refill: 1   Follow Up Instructions: In 2-3 months or sooner if needed   I discussed the assessment and treatment plan with the patient. The patient was provided an opportunity to ask questions and all were answered. The patient agreed with the plan and demonstrated an understanding of the instructions.   The patient was advised to call back or seek an in-person evaluation if the symptoms worsen or if the condition fails to improve as anticipated.  I provided 24  minutes of non-face-to-face time during this encounter.   01/20/2021, MD

## 2021-03-15 ENCOUNTER — Telehealth (HOSPITAL_BASED_OUTPATIENT_CLINIC_OR_DEPARTMENT_OTHER): Payer: 59 | Admitting: Psychiatry

## 2021-03-15 ENCOUNTER — Other Ambulatory Visit: Payer: Self-pay

## 2021-03-15 DIAGNOSIS — F4000 Agoraphobia, unspecified: Secondary | ICD-10-CM

## 2021-03-15 DIAGNOSIS — F422 Mixed obsessional thoughts and acts: Secondary | ICD-10-CM

## 2021-03-15 DIAGNOSIS — F411 Generalized anxiety disorder: Secondary | ICD-10-CM

## 2021-03-15 DIAGNOSIS — F332 Major depressive disorder, recurrent severe without psychotic features: Secondary | ICD-10-CM

## 2021-03-15 MED ORDER — BUSPIRONE HCL 10 MG PO TABS: 20.0000 mg | ORAL_TABLET | Freq: Two times a day (BID) | ORAL | 1 refills | Status: DC

## 2021-03-15 MED ORDER — SERTRALINE HCL 100 MG PO TABS: 200.0000 mg | ORAL_TABLET | Freq: Every day | ORAL | 1 refills | Status: DC

## 2021-03-15 MED ORDER — GABAPENTIN 300 MG PO CAPS: 300.0000 mg | ORAL_CAPSULE | Freq: Two times a day (BID) | ORAL | 1 refills | Status: DC

## 2021-03-15 MED ORDER — LAMOTRIGINE 25 MG PO TABS: 50.0000 mg | ORAL_TABLET | Freq: Every day | ORAL | 1 refills | Status: DC

## 2021-03-15 NOTE — Progress Notes (Signed)
Virtual Visit via Telephone Note  I connected with Khalie Ribaudo on 03/15/21 at  1:30 PM EDT by telephone and verified that I am speaking with the correct person using two identifiers. Dreanna does not have a smart phone to do a video chat.   Location: Patient: home Provider: office   I discussed the limitations, risks, security and privacy concerns of performing an evaluation and management service by telephone and the availability of in person appointments. I also discussed with the patient that there may be a patient responsible charge related to this service. The patient expressed understanding and agreed to proceed.   History of Present Illness: Flonnie finally met the man she has been talking to for the last year about 5 months ago. She has tried to meet with him again 3 times since May but he was busy working so could not.  She is in love with him and wants to continue to having a relationship with him. They continue to talk daily thru text. They do not talk on the phone often. Dnasia states that he does not ask for anything such as money and gifts. Lus's depression comes and goes. She has been depressed about 7/14 days and is endorsing sad mood, anhedonia, low motivation and energy. Makenlee has some negative thoughts about self. She has passive thoughts of death when stressed. She denies SI/HI. Her anxiety only spikes when she leaves her home. She only leaves her home to get the mail and trash. Vannah gets her grocery thru inst[employer] cart with her son's help.    Observations/Objective:  General Appearance: unable to assess  Eye Contact:  unable to assess  Speech:  Clear and Coherent and Normal Rate  Volume:  Normal  Mood:  Depressed  Affect:  Congruent  Thought Process:  Coherent and Descriptions of Associations: Circumstantial  Orientation:  Full (Time, Place, and Person)  Thought Content:  Rumination  Suicidal Thoughts:  No  Homicidal Thoughts:  No  Memory:  Immediate;   Good  Judgement:   Fair  Insight:  Good  Psychomotor Activity: unable to assess  Concentration:  Concentration: Good  Recall:  Good  Fund of Knowledge:  Good  Language:  Good  Akathisia:  unable to assess  Handed:  unable to assess  AIMS (if indicated):     Assets:  Communication Skills Desire for Improvement Financial Resources/Insurance Housing Resilience Talents/Skills Vocational/Educational  ADL's:  unable to assess  Cognition:  WNL  Sleep:        Assessment and Plan:  Depression screen PHQ 2/9 03/15/2021 01/18/2021 11/23/2020 08/10/2020 04/17/2016  Decreased Interest 2 2 1 0 2  Down, Depressed, Hopeless 2 2 2 2 2  PHQ - 2 Score 4 4 3 2 4  Altered sleeping 2 2 3 3 3  Tired, decreased energy 2 3 3 3 1  Change in appetite 0 1 0 0 1  Feeling bad or failure about yourself  2 2 1 1 1  Trouble concentrating 2 2 3 1 1  Moving slowly or fidgety/restless 2 0 0 0 0  Suicidal thoughts 1 1 0 0 0  PHQ-9 Score 15 15 13 10 11  Difficult doing work/chores Very difficult Very difficult Extremely dIfficult Somewhat difficult -    Flowsheet Row [facility] Video Visit from 01[facility]18/2021 in [facility] Video Visit from 11/23/2020 in [facility]  C-SSRS RISK CATEGORY Error: Q3, 4, or 5   should not be populated when Q2 is No Error: Q3, 4, or 5 should not be populated when Q2 is No No Risk      - we discussed healthy relationships and I encouraged her to think about if her needs are really being met in her current relationship.   1. Severe episode of recurrent major depressive disorder, without psychotic features (HCC) - busPIRone (BUSPAR) 10 MG tablet; Take 2 tablets (20 mg total) by mouth 2 (two) times daily.  Dispense: 120 tablet; Refill: 1 - lamoTRIgine (LAMICTAL) 25 MG tablet; Take 2 tablets (50 mg total) by mouth daily.  Dispense: 60 tablet; Refill: 1 -  sertraline (ZOLOFT) 100 MG tablet; Take 2 tablets (200 mg total) by mouth daily.  Dispense: 60 tablet; Refill: 1  2. Agoraphobia - busPIRone (BUSPAR) 10 MG tablet; Take 2 tablets (20 mg total) by mouth 2 (two) times daily.  Dispense: 120 tablet; Refill: 1 - sertraline (ZOLOFT) 100 MG tablet; Take 2 tablets (200 mg total) by mouth daily.  Dispense: 60 tablet; Refill: 1  3. GAD (generalized anxiety disorder) - busPIRone (BUSPAR) 10 MG tablet; Take 2 tablets (20 mg total) by mouth 2 (two) times daily.  Dispense: 120 tablet; Refill: 1 - gabapentin (NEURONTIN) 300 MG capsule; Take 1 capsule (300 mg total) by mouth 2 (two) times daily.  Dispense: 60 capsule; Refill: 1 - sertraline (ZOLOFT) 100 MG tablet; Take 2 tablets (200 mg total) by mouth daily.  Dispense: 60 tablet; Refill: 1  4. Mixed obsessional thoughts and acts - sertraline (ZOLOFT) 100 MG tablet; Take 2 tablets (200 mg total) by mouth daily.  Dispense: 60 tablet; Refill: 1   Follow Up Instructions: In 2-3 months or sooner if needed   I discussed the assessment and treatment plan with the patient. The patient was provided an opportunity to ask questions and all were answered. The patient agreed with the plan and demonstrated an understanding of the instructions.   The patient was advised to call back or seek an in-person evaluation if the symptoms worsen or if the condition fails to improve as anticipated.  I provided 24 minutes of non-face-to-face time during this encounter.    , MD  

## 2021-05-10 ENCOUNTER — Telehealth (HOSPITAL_BASED_OUTPATIENT_CLINIC_OR_DEPARTMENT_OTHER): Payer: 59 | Admitting: Psychiatry

## 2021-05-10 ENCOUNTER — Other Ambulatory Visit: Payer: Self-pay

## 2021-05-10 DIAGNOSIS — F422 Mixed obsessional thoughts and acts: Secondary | ICD-10-CM

## 2021-05-10 DIAGNOSIS — F4000 Agoraphobia, unspecified: Secondary | ICD-10-CM

## 2021-05-10 DIAGNOSIS — F411 Generalized anxiety disorder: Secondary | ICD-10-CM | POA: Diagnosis not present

## 2021-05-10 DIAGNOSIS — F332 Major depressive disorder, recurrent severe without psychotic features: Secondary | ICD-10-CM | POA: Diagnosis not present

## 2021-05-10 MED ORDER — SERTRALINE HCL 100 MG PO TABS: 200.0000 mg | ORAL_TABLET | Freq: Every day | ORAL | 1 refills | Status: DC

## 2021-05-10 MED ORDER — BUSPIRONE HCL 10 MG PO TABS: 20.0000 mg | ORAL_TABLET | Freq: Two times a day (BID) | ORAL | 1 refills | Status: DC

## 2021-05-10 MED ORDER — GABAPENTIN 300 MG PO CAPS: 300.0000 mg | ORAL_CAPSULE | Freq: Two times a day (BID) | ORAL | 1 refills | Status: DC

## 2021-05-10 MED ORDER — LAMOTRIGINE 25 MG PO TABS: 75.0000 mg | ORAL_TABLET | Freq: Every day | ORAL | 1 refills | Status: DC

## 2021-05-10 NOTE — Progress Notes (Signed)
Virtual Visit via Telephone Note  I connected with Abigail Sharp on 05/10/21 at  1:00 PM EST by telephone and verified that I am speaking with the correct person using two identifiers. We attempted to connect by video several times but were unsuccessful   Location: Patient: home Provider: office   I discussed the limitations, risks, security and privacy concerns of performing an evaluation and management service by telephone and the availability of in person appointments. I also discussed with the patient that there may be a patient responsible charge related to this service. The patient expressed understanding and agreed to proceed.   History of Present Illness: Abigail Sharp states today is not a good day. Abigail Sharp has not been sleeping well for the last few weeks. The man Abigail Sharp was talking to online is not as engaging as before. They are not talking much and have not met since that one time several months ago. They are fighting more. Abigail Sharp is depressed most days and the level 8/10 (10 being the worst). Abigail Sharp is scared of how Abigail Sharp is feeling. Her energy and motivation are low. Abigail Sharp has not left her apartment in many weeks now. Abigail Sharp is getting down on herself most days. Her appetite is fine and Abigail Sharp is eating most days. Abigail Sharp denies passive thoughts of death and SI/HI. Abigail Sharp spends a lot of her time watching tv and sleeping. Her anxiety is manageable at home but it becomes severe when Abigail Sharp goes out to even get her main or throw out the trash. Abigail Sharp is not sure if the Lamictal is helping but wants to continue.    Observations/Objective:   General Appearance: unable to assess  Eye Contact:  unable to assess  Speech:  Clear and Coherent and Normal Rate  Volume:  Normal  Mood:  Anxious and Depressed  Affect:  Congruent and Tearful  Thought Process:  Coherent and Descriptions of Associations: Intact  Orientation:  Full (Time, Place, and Person)  Thought Content:  Rumination  Suicidal Thoughts:  No  Homicidal  Thoughts:  No  Memory:  Immediate;   Fair  Judgement:  Poor  Insight:  Present  Psychomotor Activity: unable to assess  Concentration:  Concentration: Good  Recall:  Good  Fund of Knowledge:  Good  Language:  Good  Akathisia:  unable to assess  Handed:  unable to assess  AIMS (if indicated):     Assets:  Communication Skills Desire for Improvement Financial Resources/Insurance Housing Resilience Talents/Skills Vocational/Educational  ADL's:  unable to assess  Cognition:  WNL  Sleep:        Assessment and Plan: Depression screen [facility] 2/9 05/10/2021 03/15/2021 01/18/2021 11/23/2020 08/10/2020  Decreased Interest 3 2 2 1  0  Down, Depressed, Hopeless 3 2 2 2 2   PHQ - 2 Score 6 4 4 3 2   Altered sleeping 3 2 2 3 3   Tired, decreased energy 3 2 3 3 3   Change in appetite 1 0 1 0 0  Feeling bad or failure about yourself  3 2 2 1 1   Trouble concentrating 0 2 2 3 1   Moving slowly or fidgety/restless 3 2 0 0 0  Suicidal thoughts 0 1 1 0 0  PHQ-9 Score 19 15 15 13 10   Difficult doing work/chores Extremely dIfficult Very difficult Very difficult Extremely dIfficult Somewhat difficult    Flowsheet Row Video Visit from 05/10/2021 in [facility] Video Visit from 03/15/2021 in [facility] Video Visit from 01/18/2021 in [facility]  C-SSRS RISK CATEGORY Error: Q3, 4, or 5 should not be populated when Q2 is No Error: Q3, 4, or 5 should not be populated when Q2 is No Error: Q3, 4, or 5 should not be populated when Q2 is No      - advised Abigail Sharp to think about her goals and needs from romantic relationships. I urged her to consider if the relationship was healthy and meeting her needs and to decided how to move forward.  - her last visit with her PCP was sometime in 2018 or 2019 but Abigail Sharp doesn't recall exactly when. Abigail Sharp last saw her cardiologist in July 2022.  - I will increase  Lamictal to 63m po qD to target her depression symptoms.   1. GAD (generalized anxiety disorder) - busPIRone (BUSPAR) 10 MG tablet; Take 2 tablets (20 mg total) by mouth 2 (two) times daily.  Dispense: 120 tablet; Refill: 1 - gabapentin (NEURONTIN) 300 MG capsule; Take 1 capsule (300 mg total) by mouth 2 (two) times daily.  Dispense: 60 capsule; Refill: 1 - sertraline (ZOLOFT) 100 MG tablet; Take 2 tablets (200 mg total) by mouth daily.  Dispense: 60 tablet; Refill: 1  2. Severe episode of recurrent major depressive disorder, without psychotic features [place] - busPIRone (BUSPAR) 10 MG tablet; Take 2 tablets (20 mg total) by mouth 2 (two) times daily.  Dispense: 120 tablet; Refill: 1 - lamoTRIgine (LAMICTAL) 25 MG tablet; Take 3 tablets (75 mg total) by mouth daily.  Dispense: 90 tablet; Refill: 1 - sertraline (ZOLOFT) 100 MG tablet; Take 2 tablets (200 mg total) by mouth daily.  Dispense: 60 tablet; Refill: 1  3. Agoraphobia - busPIRone (BUSPAR) 10 MG tablet; Take 2 tablets (20 mg total) by mouth 2 (two) times daily.  Dispense: 120 tablet; Refill: 1 - sertraline (ZOLOFT) 100 MG tablet; Take 2 tablets (200 mg total) by mouth daily.  Dispense: 60 tablet; Refill: 1  4. Mixed obsessional thoughts and acts - sertraline (ZOLOFT) 100 MG tablet; Take 2 tablets (200 mg total) by mouth daily.  Dispense: 60 tablet; Refill: 1    Follow Up Instructions: In 1-2 months or sooner if needed   I discussed the assessment and treatment plan with the patient. The patient was provided an opportunity to ask questions and all were answered. The patient agreed with the plan and demonstrated an understanding of the instructions.   The patient was advised to call back or seek an in-person evaluation if the symptoms worsen or if the condition fails to improve as anticipated.  I provided 26 minutes of non-face-to-face time during this encounter.   SCharlcie Cradle MD

## 2021-05-14 ENCOUNTER — Other Ambulatory Visit: Payer: Self-pay

## 2021-05-14 MED ORDER — CARVEDILOL 6.25 MG PO TABS: 6.2500 mg | ORAL_TABLET | Freq: Two times a day (BID) | ORAL | 0 refills | Status: DC

## 2021-07-05 ENCOUNTER — Telehealth (HOSPITAL_BASED_OUTPATIENT_CLINIC_OR_DEPARTMENT_OTHER): Payer: 59 | Admitting: Psychiatry

## 2021-07-05 ENCOUNTER — Other Ambulatory Visit: Payer: Self-pay

## 2021-07-05 ENCOUNTER — Encounter (HOSPITAL_COMMUNITY): Payer: Self-pay | Admitting: Psychiatry

## 2021-07-05 DIAGNOSIS — F332 Major depressive disorder, recurrent severe without psychotic features: Secondary | ICD-10-CM

## 2021-07-05 DIAGNOSIS — F4000 Agoraphobia, unspecified: Secondary | ICD-10-CM

## 2021-07-05 DIAGNOSIS — F411 Generalized anxiety disorder: Secondary | ICD-10-CM

## 2021-07-05 DIAGNOSIS — F422 Mixed obsessional thoughts and acts: Secondary | ICD-10-CM

## 2021-07-05 MED ORDER — SERTRALINE HCL 100 MG PO TABS: 200.0000 mg | ORAL_TABLET | Freq: Every day | ORAL | 2 refills | Status: DC

## 2021-07-05 MED ORDER — LAMOTRIGINE 25 MG PO TABS: 75.0000 mg | ORAL_TABLET | Freq: Every day | ORAL | 2 refills | Status: DC

## 2021-07-05 MED ORDER — GABAPENTIN 300 MG PO CAPS: 300.0000 mg | ORAL_CAPSULE | Freq: Two times a day (BID) | ORAL | 2 refills | Status: DC

## 2021-07-05 MED ORDER — BUSPIRONE HCL 15 MG PO TABS: 15.0000 mg | ORAL_TABLET | Freq: Two times a day (BID) | ORAL | 2 refills | Status: DC

## 2021-07-05 NOTE — Progress Notes (Signed)
Virtual Visit via Telephone Note  I connected with Abigail Sharp on 07/05/21 at  1:00 PM EST by telephone and verified that I am speaking with the correct person using two identifiers. She was unable to connect by video and after several attempts we continued by phone.   Location: Patient: home Provider: office   I discussed the limitations, risks, security and privacy concerns of performing an evaluation and management service by telephone and the availability of in person appointments. I also discussed with the patient that there may be a patient responsible charge related to this service. The patient expressed understanding and agreed to proceed.   History of Present Illness: Abigail Sharp had a good talk with her love interest, [name]. They continue to talk mostly by text. She is in love with him and does not want to end their relationship. They have not met in person since that 1 time several months ago. Abigail Sharp states he wants to but she is not ready yet. Abigail Sharp shares that for the last several weeks she has been having VH of people when it is dark. It has been going on several times a week. The figure goes away as soon as she turns on the lights. It is not scary but really weird. Abigail Sharp often talks to the [facility] briefly until she realizes that it is not real when she turns on the light. She states the figures do no talk back to her. Abigail Sharp states she is depressed everyday and the level is 7/10 (10 being the worst). It takes her hours to fall asleep most nights. She usually gets about 4 hrs and then has to go to the bathroom and fall back asleep. Her energy is really low and during the daytime she can fall asleep where she is sitting. Abigail Sharp's appetite is fair but concentration is poor. She is endorsing mild anhedonia and denies negative self talk.  She enjoys couponing and math. She denies SI/HI. Abigail Sharp has not left her apartment in months. She uses [employer] for groceries. Her son picks up her meds for her and  bring her mail. Her son wants her to get up and go outside but Abigail Sharp is afraid to.  It has been years since she has been to her PCP and is searching for one now. She does attend her cardiology appointments- last in July 2022.    Observations/Objective:  General Appearance: unable to assess  Eye Contact:  unable to assess  Speech:  Clear and Coherent and Normal Rate  Volume:  Normal  Mood:  Anxious and Depressed  Affect:  Congruent  Thought Process:  Coherent and Descriptions of Associations: Circumstantial  Orientation:  Full (Time, Place, and Person)  Thought Content:  Rumination  Suicidal Thoughts:  No  Homicidal Thoughts:  No  Memory:  Immediate;   Good  Judgement:  Poor  Insight:  Present  Psychomotor Activity: unable to assess  Concentration:  Concentration: Good  Recall:  Good  Fund of Knowledge:  Good  Language:  Good  Akathisia:  unable to assess  Handed:  unable to assess  AIMS (if indicated):     Assets:  Communication Skills Desire for Improvement Financial Resources/Insurance Housing Social Support Talents/Skills Vocational/Educational  ADL's:  unable to assess  Cognition:  WNL  Sleep:        Assessment and Plan: Depression screen [facility] 2/9 07/05/2021 05/10/2021 03/15/2021 01/18/2021 11/23/2020  Decreased Interest 1 3 2 2 1   Down, Depressed, Hopeless 3 3 2 2 2   PHQ -  2 Score 4 6 4 4 3   Altered sleeping 3 3 2 2 3   Tired, decreased energy 3 3 2 3 3   Change in appetite 0 1 0 1 0  Feeling bad or failure about yourself  0 3 2 2 1   Trouble concentrating 3 0 2 2 3   Moving slowly or fidgety/restless 1 3 2  0 0  Suicidal thoughts 0 0 1 1 0  PHQ-9 Score 14 19 15 15 13   Difficult doing work/chores Very difficult Extremely dIfficult Very difficult Very difficult Extremely dIfficult  Some recent data might be hidden    Flowsheet Row Video Visit from 07/05/2021 in [facility] Video Visit from 05/10/2021 in [facility] Video Visit from 03/15/2021 in [place] Error: Q3, 4, or 5 should not be populated when Q2 is No Error: Q3, 4, or 5 should not be populated when Q2 is No Error: Q3, 4, or 5 should not be populated when Q2 is No      - decrease Buspar to 33m po BID due to daytime fatigue.    She is endorsing VH in the dark. I am not sure what this is due to but I suspect the excessive fatigue is playing a part in it.  1. GAD (generalized anxiety disorder) - gabapentin (NEURONTIN) 300 MG capsule; Take 1 capsule (300 mg total) by mouth 2 (two) times daily.  Dispense: 60 capsule; Refill: 2 - sertraline (ZOLOFT) 100 MG tablet; Take 2 tablets (200 mg total) by mouth daily.  Dispense: 60 tablet; Refill: 2 - busPIRone (BUSPAR) 15 MG tablet; Take 1 tablet (15 mg total) by mouth 2 (two) times daily.  Dispense: 60 tablet; Refill: 2  2. Severe episode of recurrent major depressive disorder, without psychotic features ([place] - sertraline (ZOLOFT) 100 MG tablet; Take 2 tablets (200 mg total) by mouth daily.  Dispense: 60 tablet; Refill: 2 - lamoTRIgine (LAMICTAL) 25 MG tablet; Take 3 tablets (75 mg total) by mouth daily.  Dispense: 90 tablet; Refill: 2 - busPIRone (BUSPAR) 15 MG tablet; Take 1 tablet (15 mg total) by mouth 2 (two) times daily.  Dispense: 60 tablet; Refill: 2  3. Agoraphobia - sertraline (ZOLOFT) 100 MG tablet; Take 2 tablets (200 mg total) by mouth daily.  Dispense: 60 tablet; Refill: 2 - busPIRone (BUSPAR) 15 MG tablet; Take 1 tablet (15 mg total) by mouth 2 (two) times daily.  Dispense: 60 tablet; Refill: 2  4. Mixed obsessional thoughts and acts - sertraline (ZOLOFT) 100 MG tablet; Take 2 tablets (200 mg total) by mouth daily.  Dispense: 60 tablet; Refill: 2    Follow Up Instructions: In 2-3 months or sooner if needed- by video. I informed her that we needed to meet in person or do a video visit for all  future appointments. She stated she would do a video visit next time as she is getting a new smart phone in the mail soon.    I discussed the assessment and treatment plan with the patient. The patient was provided an opportunity to ask questions and all were answered. The patient agreed with the plan and demonstrated an understanding of the instructions.   The patient was advised to call back or seek an in-person evaluation if the symptoms worsen or if the condition fails to improve as anticipated.  I provided 30 minutes of non-face-to-face time during this encounter.   SCharlcie Cradle MD

## 2021-09-07 ENCOUNTER — Telehealth: Payer: Self-pay | Admitting: Interventional Cardiology

## 2021-09-07 MED ORDER — SPIRONOLACTONE 25 MG PO TABS: 12.5000 mg | ORAL_TABLET | Freq: Every day | ORAL | 0 refills | Status: DC

## 2021-09-07 MED ORDER — CARVEDILOL 6.25 MG PO TABS: 6.2500 mg | ORAL_TABLET | Freq: Two times a day (BID) | ORAL | 0 refills | Status: DC

## 2021-09-07 NOTE — Telephone Encounter (Signed)
Pt's medication was sent to pt's pharmacy as requested. Confirmation received.  °

## 2021-09-07 NOTE — Telephone Encounter (Signed)
?*  STAT* If patient is at the pharmacy, call can be transferred to refill team. ? ? ?1. Which medications need to be refilled? (please list name of each medication and dose if known)   ?carvedilol (COREG) 6.25 MG tablet ?spironolactone (ALDACTONE) 25 MG tablet ? ?2. Which pharmacy/location (including street and city if local pharmacy) is medication to be sent to? [id number] - [place], [place] - [address] AT [facility] [address] ? ?3. Do they need a 30 day or 90 day supply? 30 day  ?

## 2021-09-20 ENCOUNTER — Telehealth (HOSPITAL_COMMUNITY): Payer: Self-pay

## 2021-09-20 ENCOUNTER — Telehealth (HOSPITAL_BASED_OUTPATIENT_CLINIC_OR_DEPARTMENT_OTHER): Payer: Self-pay | Admitting: Psychiatry

## 2021-09-20 DIAGNOSIS — F422 Mixed obsessional thoughts and acts: Secondary | ICD-10-CM

## 2021-09-20 DIAGNOSIS — F411 Generalized anxiety disorder: Secondary | ICD-10-CM

## 2021-09-20 DIAGNOSIS — F332 Major depressive disorder, recurrent severe without psychotic features: Secondary | ICD-10-CM

## 2021-09-20 DIAGNOSIS — F4000 Agoraphobia, unspecified: Secondary | ICD-10-CM

## 2021-09-20 MED ORDER — GABAPENTIN 300 MG PO CAPS: 300.0000 mg | ORAL_CAPSULE | Freq: Two times a day (BID) | ORAL | 2 refills | Status: DC

## 2021-09-20 MED ORDER — LAMOTRIGINE 100 MG PO TABS: 100.0000 mg | ORAL_TABLET | Freq: Every day | ORAL | 2 refills | Status: DC

## 2021-09-20 MED ORDER — SERTRALINE HCL 100 MG PO TABS: 200.0000 mg | ORAL_TABLET | Freq: Every day | ORAL | 2 refills | Status: DC

## 2021-09-20 MED ORDER — BUSPIRONE HCL 15 MG PO TABS: 15.0000 mg | ORAL_TABLET | Freq: Two times a day (BID) | ORAL | 2 refills | Status: DC

## 2021-09-20 NOTE — Progress Notes (Signed)
Virtual Visit via Video Note ? ?I connected with [name] on 09/20/21 at  3:00 PM EDT by a video enabled telemedicine application and verified that I am speaking with the correct person using two identifiers. ? ?Location: ?Patient: home ?Provider: office ?  ?I discussed the limitations of evaluation and management by telemedicine and the availability of in person appointments. The patient expressed understanding and agreed to proceed. ? ?History of Present Illness: ?"I have my ups and downs with Jomarie Longs". "I am doing ok. Things are still the same". Akeya wishes things could be better for her. She has ongoing depression, anxiety and agoraphobia. Donika is having knee pain and has been sleeping a lot. She denies SI/HI. Things between her and Jomarie Longs are rocky but has started to improve recently. There were days he was ignoring her and she doesn't understand why. The last time Grasiela left her apartment was 2 weeks ago. Since our last visit she has gone out 3 times to check her mail or take the trash out. Shamona sometimes snaps at people and doesn't like it.  ?  ?Observations/Objective: ?Psychiatric Specialty Exam: ?ROS  ?There were no vitals taken for this visit.There is no height or weight on file to calculate BMI.  ?General Appearance: Casual and Neat  ?Eye Contact:  Good  ?Speech:  Clear and Coherent and Normal Rate  ?Volume:  Normal  ?Mood:  Anxious and Depressed  ?Affect:  Full Range  ?Thought Process:  Goal Directed, Linear, and Descriptions of Associations: Intact  ?Orientation:  Full (Time, Place, and Person)  ?Thought Content:  Logical  ?Suicidal Thoughts:  No  ?Homicidal Thoughts:  No  ?Memory:  Immediate;   Good  ?Judgement:  Good  ?Insight:  Good  ?Psychomotor Activity:  Normal  ?Concentration:  Concentration: Good  ?Recall:  Good  ?Fund of Knowledge:  Good  ?Language:  Good  ?Akathisia:  No  ?Handed:  Right  ?AIMS (if indicated):     ?Assets:  Communication Skills ?Desire for Improvement ?Financial  Resources/Insurance ?Housing ?Resilience ?Social Support ?Talents/Skills ?Transportation ?Vocational/Educational  ?ADL's:  Intact  ?Cognition:  WNL  ?Sleep:     ? ? ? ?Assessment and Plan: ? ? ?  07/05/2021  ?  1:18 PM 05/10/2021  ?  1:08 PM 03/15/2021  ?  1:35 PM 01/18/2021  ?  1:23 PM 11/23/2020  ?  9:40 AM  ?Depression screen PHQ 2/9  ?Decreased Interest 1 3 2 2 1   ?Down, Depressed, Hopeless 3 3 2 2 2   ?PHQ - 2 Score 4 6 4 4 3   ?Altered sleeping 3 3 2 2 3   ?Tired, decreased energy 3 3 2 3 3   ?Change in appetite 0 1 0 1 0  ?Feeling bad or failure about yourself  0 3 2 2 1   ?Trouble concentrating 3 0 2 2 3   ?Moving slowly or fidgety/restless 1 3 2  0 0  ?Suicidal thoughts 0 0 1 1 0  ?PHQ-9 Score 14 19 15 15 13   ?Difficult doing work/chores Very difficult Extremely dIfficult Very difficult Very difficult Extremely dIfficult  ? ? ?Flowsheet Row Video Visit from 07/05/2021 in [facility] Video Visit from [facility]/10[facility]2021 in [facility] Video Visit from [facility]15/2021 [facility]  ?C-SSRS RISK CATEGORY Error: Q3, 4, or 5 should not be populated when Q2 is No Error: Q3, 4, or 5 should not be populated when Q2 is No Error: Q3, 4, or 5 should not be  populated when Q2 is No  ? ?  ? ? ? ? ?Status of current problems: ongoing depression and anxiety ? ?Meds:  ?1. Severe episode of recurrent major depressive disorder, without psychotic features (HCC) ?- busPIRone (BUSPAR) 15 MG tablet; Take 1 tablet (15 mg total) by mouth 2 (two) times daily.  Dispense: 60 tablet; Refill: 2 ?- sertraline (ZOLOFT) 100 MG tablet; Take 2 tablets (200 mg total) by mouth daily.  Dispense: 60 tablet; Refill: 2 ?- lamoTRIgine (LAMICTAL) 100 MG tablet; Take 1 tablet (100 mg total) by mouth daily.  Dispense: 30 tablet; Refill: 2 ? ?2. Agoraphobia ?- busPIRone (BUSPAR) 15 MG tablet; Take 1 tablet (15 mg total) by mouth 2 (two) times daily.  Dispense:  60 tablet; Refill: 2 ?- sertraline (ZOLOFT) 100 MG tablet; Take 2 tablets (200 mg total) by mouth daily.  Dispense: 60 tablet; Refill: 2 ? ?3. GAD (generalized anxiety disorder) ?- busPIRone (BUSPAR) 15 MG tablet; Take 1 tablet (15 mg total) by mouth 2 (two) times daily.  Dispense: 60 tablet; Refill: 2 ?- sertraline (ZOLOFT) 100 MG tablet; Take 2 tablets (200 mg total) by mouth daily.  Dispense: 60 tablet; Refill: 2 ?- gabapentin (NEURONTIN) 300 MG capsule; Take 1 capsule (300 mg total) by mouth 2 (two) times daily.  Dispense: 60 capsule; Refill: 2 ? ?4. Mixed obsessional thoughts and acts ?- sertraline (ZOLOFT) 100 MG tablet; Take 2 tablets (200 mg total) by mouth daily.  Dispense: 60 tablet; Refill: 2 ? ? ? ? ?Therapy: brief supportive therapy provided. Collaboration of Care: Other none today ? ?Patient/Guardian was advised Release of Information must be obtained prior to any record release in order to collaborate their care with an outside provider. Patient/Guardian was advised if they have not already done so to contact the registration department to sign all necessary forms in order for [place] to release information regarding their care.  ? ?Consent: Patient/Guardian gives verbal consent for treatment and assignment of benefits for services provided during this visit. Patient/Guardian expressed understanding and agreed to proceed.  ? ? ? ? ?Follow Up Instructions: ?Follow up in 2-3 months or sooner if needed ?  ? ?I discussed the assessment and treatment plan with the patient. The patient was provided an opportunity to ask questions and all were answered. The patient agreed with the plan and demonstrated an understanding of the instructions. ?  ?The patient was advised to call back or seek an in-person evaluation if the symptoms worsen or if the condition fails to improve as anticipated. ? ?I provided 15 minutes of non-face-to-face time during this encounter. ? ? ?Oletta Darter, MD ? ?

## 2021-09-20 NOTE — Telephone Encounter (Signed)
Patient called regarding her Gabapentin and her Lamotrigine. Pt just needs clarification. Pt's Gabapentin is confusing. It was sent in today Gabapentin 300mg  and the sig states "Take 1 capsule (300mg  total) by mouth 2 (two) times daily." Is she supposed to take 1 totaling 300mg  or 2 totaling 600mg ? Pt's Lamotrigine 100mg  sig states "Take 1 tablet (100 mg total) by mouth daily." Is that correct? Please review and advise. Thank you ?

## 2021-09-24 ENCOUNTER — Telehealth (HOSPITAL_COMMUNITY): Payer: Self-pay

## 2021-09-24 NOTE — Telephone Encounter (Signed)
RELAYED MESSAGE TO PATIENT

## 2021-09-24 NOTE — Telephone Encounter (Signed)
Writer called pt to relay the message regarding her Gabapentin. Now she stated that she wants clarification on her Lamotrigine 100mg  because she stated that she thinks you increased it to 200mg ? However, that's not what's stated in your last ov notes. Please clarify. Please review and advise. Thank you ?

## 2021-09-28 NOTE — Telephone Encounter (Signed)
RELAYED MESSAGE - LVM ?

## 2021-10-07 NOTE — Progress Notes (Deleted)
Cardiology Office Note:    Date:  10/07/2021   ID:  Abigail Sharp, DOB 1965-11-29, MRN [id number]  PCP:  Oneita Hurt, No  Cardiologist:  Lesleigh Noe, MD   Referring MD: No ref. provider found   No chief complaint on file.   History of Present Illness:    Abigail Sharp is a 56 y.o. female with a hx of systolic heart failure, hypertension, pre-diabetes, LVEF 25% improved to 55% on medical therapy.      ***  Past Medical History:  Diagnosis Date   Anxiety    Chronic systolic heart failure (HCC)    Depression    HTN (hypertension)    Microcytic anemia    NICM (nonischemic cardiomyopathy) (HCC) 11/2014   EF is 25% improved to 55% in 2015.   Pre-diabetes     Past Surgical History:  Procedure Laterality Date   CARDIAC CATHETERIZATION N/A 11/07/2014   Procedure: Right/Left Heart Cath and Coronary Angiography;  Surgeon: Runell Gess, MD;  Location: [facility] INVASIVE CV [facility];  Service: Cardiovascular;  Laterality: N/A;   TONSILLECTOMY     TUBAL LIGATION      Current Medications: No outpatient medications have been marked as taking for the 10/11/21 encounter (Appointment) with Lyn Records, MD.     Allergies:   Sulfa antibiotics   Social History   Socioeconomic History   Marital status: Single    Spouse name: Not on file   Number of children: Not on file   Years of education: Not on file   Highest education level: Not on file  Occupational History   Not on file  Tobacco Use   Smoking status: Never   Smokeless tobacco: Never  Vaping Use   Vaping Use: Never used  Substance and Sexual Activity   Alcohol use: No    Alcohol/week: 0.0 standard drinks   Drug use: No   Sexual activity: Never  Other Topics Concern   Not on file  Social History Narrative   Social Hx:   Current living situation: Lives alone in apt in [place]: [place], [place] by parents.   Raised by parents. Parents let her do whatever she wanted to do while growing up. Both parents now deceased.    Siblings  1 brother and 2 sisters. Pt is number 3   Schooling- HS grad, faced a lot of bullied due to teen pregnancy at 5. Lived with parents while raising her oldest child but for the youngest she moved out temporarily. Her mom took over raising him at the age of 48 mo.    Employed- last job in 2002. 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 There is no history of Alcohol abuse, Anxiety disorder, Bipolar disorder, Depression, or Drug abuse.  ROS:   Please see the history of present illness.    *** All other  systems reviewed and are negative.  EKGs/Labs/Other Studies Reviewed:    The following studies were reviewed today: ***  EKG:  EKG ***  Recent Labs: No results found for requested labs within last 8760 hours.  Recent Lipid Panel    Component Value Date/Time   CHOL 189 08/23/2015 1028   TRIG 290 (H) 08/23/2015 1028   HDL 36 (L) 08/23/2015 1028   CHOLHDL 5.3 (H) 08/23/2015 1028   VLDL 58 (H) 08/23/2015 1028   LDLCALC 95 08/23/2015 1028    Physical Exam:    VS:  There were no vitals taken for this visit.    Wt Readings from Last 3 Encounters:  01/12/20 254 lb (115.2 kg)  02/02/19 260 lb (117.9 kg)  02/19/18 285 lb (129.3 kg)     GEN: ***. No acute distress HEENT: Normal NECK: No JVD. LYMPHATICS: No lymphadenopathy CARDIAC: *** murmur. RRR *** gallop, or edema. VASCULAR: *** Normal Pulses. No bruits. RESPIRATORY:  Clear to auscultation without  rales, wheezing or rhonchi  ABDOMEN: Soft, non-tender, non-distended, No pulsatile mass, MUSCULOSKELETAL: No deformity  SKIN: Warm and dry NEUROLOGIC:  Alert and oriented x 3 PSYCHIATRIC:  Normal affect   ASSESSMENT:    1. Chronic systolic heart failure (HCC)   2. Essential hypertension   3. Morbid obesity (HCC)   4. Obsessive-compulsive disorder, unspecified type    PLAN:    In order of problems listed above:  ***   Medication Adjustments/Labs and Tests Ordered: Current medicines are reviewed at length with the patient today.  Concerns regarding medicines are outlined above.  No orders of the defined types were placed in this encounter.  No orders of the defined types were placed in this encounter.   There are no Patient Instructions on file for this visit.   Signed, Lesleigh Noe, MD  10/07/2021 2:18 PM    [facility]

## 2021-10-08 ENCOUNTER — Other Ambulatory Visit: Payer: Self-pay

## 2021-10-08 ENCOUNTER — Telehealth: Payer: Self-pay | Admitting: Interventional Cardiology

## 2021-10-08 MED ORDER — SPIRONOLACTONE 25 MG PO TABS: 12.5000 mg | ORAL_TABLET | Freq: Every day | ORAL | 0 refills | Status: DC

## 2021-10-08 MED ORDER — CARVEDILOL 6.25 MG PO TABS: 6.2500 mg | ORAL_TABLET | Freq: Two times a day (BID) | ORAL | 0 refills | Status: DC

## 2021-10-08 NOTE — Telephone Encounter (Signed)
?*  STAT* If patient is at the pharmacy, call can be transferred to refill team. ? ? ?1. Which medications need to be refilled? (please list name of each medication and dose if known) carvedilol (COREG) 6.25 MG tablet ?spironolactone (ALDACTONE) 25 MG tablet ? ?2. Which pharmacy/location (including street and city if local pharmacy) is medication to be sent to? [id number] - [place], [place] - [address] AT [facility] [address] ? ?3. Do they need a 30 day or 90 day supply? 90 day  ?

## 2021-10-11 ENCOUNTER — Ambulatory Visit: Payer: Self-pay | Admitting: Internal Medicine

## 2021-10-11 ENCOUNTER — Ambulatory Visit: Payer: Self-pay | Admitting: Interventional Cardiology

## 2021-10-11 DIAGNOSIS — I5022 Chronic systolic (congestive) heart failure: Secondary | ICD-10-CM

## 2021-10-11 DIAGNOSIS — I10 Essential (primary) hypertension: Secondary | ICD-10-CM

## 2021-10-11 DIAGNOSIS — F429 Obsessive-compulsive disorder, unspecified: Secondary | ICD-10-CM

## 2021-11-13 ENCOUNTER — Other Ambulatory Visit: Payer: Self-pay

## 2021-11-13 NOTE — Telephone Encounter (Signed)
Pt has an upcoming appt in August 2023 with Dr. [name], but pt has not been seen since 2021 and has cancelled appt in the past. Does Dr. [name] want pt to be seen sooner before another refills? Please advise

## 2021-11-14 MED ORDER — SPIRONOLACTONE 25 MG PO TABS: 12.5000 mg | ORAL_TABLET | Freq: Every day | ORAL | 1 refills | Status: DC

## 2021-11-14 MED ORDER — CARVEDILOL 6.25 MG PO TABS: 6.2500 mg | ORAL_TABLET | Freq: Two times a day (BID) | ORAL | 1 refills | Status: DC

## 2021-11-29 ENCOUNTER — Telehealth (HOSPITAL_BASED_OUTPATIENT_CLINIC_OR_DEPARTMENT_OTHER): Payer: Self-pay | Admitting: Psychiatry

## 2021-11-29 DIAGNOSIS — F332 Major depressive disorder, recurrent severe without psychotic features: Secondary | ICD-10-CM

## 2021-11-29 DIAGNOSIS — F411 Generalized anxiety disorder: Secondary | ICD-10-CM

## 2021-11-29 DIAGNOSIS — F4000 Agoraphobia, unspecified: Secondary | ICD-10-CM

## 2021-11-29 DIAGNOSIS — F422 Mixed obsessional thoughts and acts: Secondary | ICD-10-CM

## 2021-11-29 MED ORDER — GABAPENTIN 400 MG PO CAPS: 400.0000 mg | ORAL_CAPSULE | Freq: Two times a day (BID) | ORAL | 2 refills | Status: DC

## 2021-11-29 MED ORDER — SERTRALINE HCL 100 MG PO TABS: 200.0000 mg | ORAL_TABLET | Freq: Every day | ORAL | 2 refills | Status: DC

## 2021-11-29 MED ORDER — BUSPIRONE HCL 15 MG PO TABS: 15.0000 mg | ORAL_TABLET | Freq: Two times a day (BID) | ORAL | 2 refills | Status: DC

## 2021-11-29 MED ORDER — LAMOTRIGINE 100 MG PO TABS: 100.0000 mg | ORAL_TABLET | Freq: Every day | ORAL | 2 refills | Status: DC

## 2021-11-29 NOTE — Progress Notes (Signed)
Virtual Visit via Video Note  I connected with Abigail Sharp on 11/29/21 at  2:30 PM EDT by a video enabled telemedicine application and verified that I am speaking with the correct person using two identifiers.  Location: Patient: home Provider: office   I discussed the limitations of evaluation and management by telemedicine and the availability of in person appointments. The patient expressed understanding and agreed to proceed.  History of Present Illness: Abigail Sharp states she is having a "blue day". She has been out twice since we last spoke. She went up to the dumpster and mailbox close to her apartment. She is able to see her apartment from each place. Abigail Sharp thinks she might have arthritis in her knee. It is very painful to walk on and she is going to set up an appointment with her PCP soon.Abigail Sharp is still texting with Abigail Sharp almost daily. They rarely talk on the phone. She feels they are not clicking and texting causes a lot of misunderstanding. She has been talking to other guys but can't seem to get over [name]. She is not gaining anything out of the relationship and doesn't love him anymore like she used to. They only met in person once in August of 2022. Her depression is ongoing. She denies isolation and has been talking to her son on the phone and other people online. Sometimes she feels irritated and will snap at people. Abigail Sharp really wants to be in a romantic relationship. She is lonely. Abigail Sharp denies SI/HI. She sometimes wonders why she is here. Her anxiety is getting worse. She heard a loud noise the other night and has been feeling scared ever since. Abigail Sharp is getting about 12 hrs of broken sleep. Her meds are delivered to her.     Observations/Objective: Psychiatric Specialty Exam: ROS  There were no vitals taken for this visit.There is no height or weight on file to calculate BMI.  General Appearance: Casual and Neat  Eye Contact:  Good  Speech:  Clear and Coherent and Normal Rate   Volume:  Normal  Mood:  Anxious and Depressed  Affect:  Congruent  Thought Process:  Coherent and Descriptions of Associations: Circumstantial  Orientation:  Full (Time, Place, and Person)  Thought Content:  Rumination  Suicidal Thoughts:  No  Homicidal Thoughts:  No  Memory:  Immediate;   Good  Judgement:  Fair  Insight:  Good  Psychomotor Activity:  Normal  Concentration:  Concentration: Good  Recall:  Good  Fund of Knowledge:  Good  Language:  Good  Akathisia:  No  Handed:  Right  AIMS (if indicated):     Assets:  Communication Skills Desire for Improvement Financial Resources/Insurance Housing Talents/Skills Vocational/Educational  ADL's:  Intact  Cognition:  WNL  Sleep:        Assessment and Plan:     07/05/2021    1:18 PM 05/10/2021    1:08 PM 03/15/2021    1:35 PM 01/18/2021    1:23 PM 11/23/2020    9:40 AM  Depression screen PHQ 2/9  Decreased Interest _0 Down, Depressed, Hopeless _1 PHQ - 2 Score _2 Altered sleeping _3 Tired, decreased energy _4 Change in appetite 0 1 0 1 0  Feeling bad or failure about yourself  0 _5 Trouble concentrating 3 0 _6 Moving slowly or fidgety/restless  _0 0 0  Suicidal thoughts 0 0 1 1 0  PHQ-9 Score _1 Difficult doing work/chores Very difficult Extremely dIfficult Very difficult Very difficult Extremely dIfficult    Flowsheet Row Video Visit from 07/05/2021 in [facility] Video Visit from 05/10/2021 in [facility] Video Visit from 03/15/2021 in [place] Error: Q3, 4, or 5 should not be populated when Q2 is No Error: Q3, 4, or 5 should not be populated when Q2 is No Error: Q3, 4, or 5 should not be populated when Q2 is No          Status of current problems: ongoing depression and anxiety  Meds:  increase  Gabapentin 457m po BID for anxiety 1. Severe episode of recurrent major depressive disorder, without psychotic features ([facility] - busPIRone (BUSPAR) 15 MG tablet; Take 1 tablet (15 mg total) by mouth 2 (two) times daily.  Dispense: 60 tablet; Refill: 2 - lamoTRIgine (LAMICTAL) 100 MG tablet; Take 1 tablet (100 mg total) by mouth daily.  Dispense: 30 tablet; Refill: 2 - sertraline (ZOLOFT) 100 MG tablet; Take 2 tablets (200 mg total) by mouth daily.  Dispense: 60 tablet; Refill: 2  2. GAD (generalized anxiety disorder) - gabapentin (NEURONTIN) 400 MG capsule; Take 1 capsule (400 mg total) by mouth 2 (two) times daily.  Dispense: 60 capsule; Refill: 2 - busPIRone (BUSPAR) 15 MG tablet; Take 1 tablet (15 mg total) by mouth 2 (two) times daily.  Dispense: 60 tablet; Refill: 2 - sertraline (ZOLOFT) 100 MG tablet; Take 2 tablets (200 mg total) by mouth daily.  Dispense: 60 tablet; Refill: 2  3. Agoraphobia - busPIRone (BUSPAR) 15 MG tablet; Take 1 tablet (15 mg total) by mouth 2 (two) times daily.  Dispense: 60 tablet; Refill: 2 - sertraline (ZOLOFT) 100 MG tablet; Take 2 tablets (200 mg total) by mouth daily.  Dispense: 60 tablet; Refill: 2  4. Mixed obsessional thoughts and acts - sertraline (ZOLOFT) 100 MG tablet; Take 2 tablets (200 mg total) by mouth daily.  Dispense: 60 tablet; Refill: 2     Labs: none    Therapy: brief supportive therapy provided. Discussed psychosocial stressors in detail. Discussed benefit of using guided meditation for anxiety relief.   Collaboration of Care: Other none  Patient/Guardian was advised Release of Information must be obtained prior to any record release in order to collaborate their care with an outside provider. Patient/Guardian was advised if they have not already done so to contact the registration department to sign all necessary forms in order for [place] release information regarding their care.   Consent: Patient/Guardian gives verbal consent for  treatment and assignment of benefits for services provided during this visit. Patient/Guardian expressed understanding and agreed to proceed.   Follow Up Instructions: Follow up in 2-3 months or sooner if needed    I discussed the assessment and treatment plan with the patient. The patient was provided an opportunity to ask questions and all were answered. The patient agreed with the plan and demonstrated an understanding of the instructions.   The patient was advised to call back or seek an in-person evaluation if the symptoms worsen or if the condition fails to improve as anticipated.  I provided 26 minutes of non-face-to-face time during this encounter.   SCharlcie Cradle MD

## 2021-12-28 ENCOUNTER — Ambulatory Visit: Payer: Self-pay | Admitting: Interventional Cardiology

## 2022-01-03 ENCOUNTER — Ambulatory Visit: Payer: Self-pay | Admitting: Interventional Cardiology

## 2022-01-04 ENCOUNTER — Ambulatory Visit: Payer: Self-pay | Admitting: Interventional Cardiology

## 2022-01-31 ENCOUNTER — Telehealth (HOSPITAL_BASED_OUTPATIENT_CLINIC_OR_DEPARTMENT_OTHER): Payer: Self-pay | Admitting: Psychiatry

## 2022-01-31 DIAGNOSIS — F332 Major depressive disorder, recurrent severe without psychotic features: Secondary | ICD-10-CM

## 2022-01-31 DIAGNOSIS — F4000 Agoraphobia, unspecified: Secondary | ICD-10-CM

## 2022-01-31 DIAGNOSIS — F422 Mixed obsessional thoughts and acts: Secondary | ICD-10-CM

## 2022-01-31 DIAGNOSIS — F411 Generalized anxiety disorder: Secondary | ICD-10-CM

## 2022-01-31 MED ORDER — SERTRALINE HCL 100 MG PO TABS: 200.0000 mg | ORAL_TABLET | Freq: Every day | ORAL | 1 refills | Status: DC

## 2022-01-31 MED ORDER — BUSPIRONE HCL 10 MG PO TABS: 20.0000 mg | ORAL_TABLET | Freq: Two times a day (BID) | ORAL | 1 refills | Status: DC

## 2022-01-31 MED ORDER — GABAPENTIN 400 MG PO CAPS: 400.0000 mg | ORAL_CAPSULE | Freq: Two times a day (BID) | ORAL | 1 refills | Status: DC

## 2022-01-31 MED ORDER — LAMOTRIGINE 100 MG PO TABS: 100.0000 mg | ORAL_TABLET | Freq: Every day | ORAL | 1 refills | Status: DC

## 2022-01-31 NOTE — Progress Notes (Signed)
Virtual Visit via  Video Note  I connected with Abigail Sharp on 01/31/22 at  2:00 PM EDT by    a video enabled telemedicine application and verified that I am speaking with the correct person using two identifiers.  Location: Patient: home Provider: office   I discussed the limitations of evaluation and management by telemedicine and the availability of in person appointments. The patient expressed understanding and agreed to proceed.  History of Present Illness: Abigail Sharp shares she is not sleeping well. Today she just woke up from a nap for our scheduled visit. She often falls asleep sitting up in a chair and is having a lot of leg pain. Her depression is worse. She feels depressed every day and the average level is 8/10  (10 being the worst).  Abigail Sharp is endorsing daily anhedonia. She denies negative self talk. She denies SI/HI. Abigail Sharp has not left her apartment except to occasionally check her mail or take out the trash. She wants to go out but is scared of her neighbors. Her anxiety is ok as along as she is home but as soon as she starts thinking about going out then it gets overwhelming. Abigail Sharp shares that she is still talking to her boyfriend but they are having a lot of problems. He has not shared much personal info with her. They have not met in 1 year and are now only texting. A couple of weeks ago he told her he was going to stop by but he never did.    Observations/Objective: Psychiatric Specialty Exam: ROS  There were no vitals taken for this visit.There is no height or weight on file to calculate BMI.  General Appearance: Casual  Eye Contact:  Good  Speech:  Clear and Coherent and Normal Rate  Volume:  Normal  Mood:  Anxious and Depressed  Affect:  Congruent  Thought Process:  Coherent and Descriptions of Associations: Circumstantial  Orientation:  Full (Time, Place, and Person)  Thought Content:  Rumination  Suicidal Thoughts:  No  Homicidal Thoughts:  No  Memory:  Immediate;    Good  Judgement:  Poor  Insight:  Shallow  Psychomotor Activity:  Normal  Concentration:  Concentration: Good  Recall:  Good  Fund of Knowledge:  Good  Language:  Good  Akathisia:  No  Handed:  Right  AIMS (if indicated):     Assets:  Communication Skills Desire for Improvement Financial Resources/Insurance Housing Resilience Talents/Skills Vocational/Educational  ADL's:  Intact  Cognition:  WNL  Sleep:        Assessment and Plan:     01/31/2022    2:11 PM 07/05/2021    1:18 PM 05/10/2021    1:08 PM 03/15/2021    1:35 PM 01/18/2021    1:23 PM  Depression screen PHQ 2/9  Decreased Interest 3 1 3 2 2   Down, Depressed, Hopeless 3 3 3 2 2   PHQ - 2 Score 6 4 6 4 4   Altered sleeping 3 3 3 2 2   Tired, decreased energy 3 3 3 2 3   Change in appetite 0 0 1 0 1  Feeling bad or failure about yourself  0 0 3 2 2   Trouble concentrating 3 3 0 2 2  Moving slowly or fidgety/restless 0 1 3 2  0  Suicidal thoughts 0 0 0 1 1  PHQ-9 Score 15 14 19 15 15   Difficult doing work/chores Extremely dIfficult Very difficult Extremely dIfficult Very difficult Very difficult    Flowsheet Row Video Visit from  01/31/2022 in [facility] Video Visit from 07/05/2021 in [facility] Video Visit from 05/10/2021 in [facility]  C-SSRS RISK CATEGORY No Risk Error: Q3, 4, or 5 should not be populated when Q2 is No Error: Q3, 4, or 5 should not be populated when Q2 is No        Pt is aware that these meds carry a teratogenic risk. Pt will discuss plan of action if she does or plans to become pregnant in the future.  Status of current problems: worsening depression symptoms  Meds: increase Buspar to 13m po BID for depression and anxiety 1. GAD (generalized anxiety disorder) - busPIRone (BUSPAR) 10 MG tablet; Take 2 tablets (20 mg total) by mouth 2 (two) times daily.  Dispense: 120 tablet;  Refill: 1 - gabapentin (NEURONTIN) 400 MG capsule; Take 1 capsule (400 mg total) by mouth 2 (two) times daily.  Dispense: 60 capsule; Refill: 1 - sertraline (ZOLOFT) 100 MG tablet; Take 2 tablets (200 mg total) by mouth daily.  Dispense: 60 tablet; Refill: 1  2. Severe episode of recurrent major depressive disorder, without psychotic features ([place] - busPIRone (BUSPAR) 10 MG tablet; Take 2 tablets (20 mg total) by mouth 2 (two) times daily.  Dispense: 120 tablet; Refill: 1 - lamoTRIgine (LAMICTAL) 100 MG tablet; Take 1 tablet (100 mg total) by mouth daily.  Dispense: 30 tablet; Refill: 1 - sertraline (ZOLOFT) 100 MG tablet; Take 2 tablets (200 mg total) by mouth daily.  Dispense: 60 tablet; Refill: 1  3. Agoraphobia - busPIRone (BUSPAR) 10 MG tablet; Take 2 tablets (20 mg total) by mouth 2 (two) times daily.  Dispense: 120 tablet; Refill: 1 - sertraline (ZOLOFT) 100 MG tablet; Take 2 tablets (200 mg total) by mouth daily.  Dispense: 60 tablet; Refill: 1  4. Mixed obsessional thoughts and acts - sertraline (ZOLOFT) 100 MG tablet; Take 2 tablets (200 mg total) by mouth daily.  Dispense: 60 tablet; Refill: 1     Labs: none   Therapy: brief supportive therapy provided. Discussed psychosocial stressors in detail.      Collaboration of Care: Patient refused AEB therapy referall  Patient/Guardian was advised Release of Information must be obtained prior to any record release in order to collaborate their care with an outside provider. Patient/Guardian was advised if they have not already done so to contact the registration department to sign all necessary forms in order for [place] release information regarding their care.   Consent: Patient/Guardian gives verbal consent for treatment and assignment of benefits for services provided during this visit. Patient/Guardian expressed understanding and agreed to proceed.      Follow Up Instructions: Follow up in 6-8 weeks or sooner if needed. Today  is the last day of her health insurance and she is looking for other options so she can continue health care.     I discussed the assessment and treatment plan with the patient. The patient was provided an opportunity to ask questions and all were answered. The patient agreed with the plan and demonstrated an understanding of the instructions.   The patient was advised to call back or seek an in-person evaluation if the symptoms worsen or if the condition fails to improve as anticipated.  I provided 16 minutes of non-face-to-face time during this encounter.   SCharlcie Cradle MD

## 2022-02-07 ENCOUNTER — Ambulatory Visit: Payer: Self-pay | Admitting: Internal Medicine

## 2022-03-11 ENCOUNTER — Ambulatory Visit: Payer: Self-pay | Admitting: Interventional Cardiology

## 2022-03-28 ENCOUNTER — Telehealth (HOSPITAL_COMMUNITY): Payer: Self-pay | Admitting: Psychiatry

## 2022-05-02 ENCOUNTER — Telehealth (HOSPITAL_COMMUNITY): Payer: Self-pay | Admitting: Psychiatry

## 2022-05-09 ENCOUNTER — Ambulatory Visit: Payer: Self-pay | Admitting: Interventional Cardiology

## 2022-05-28 ENCOUNTER — Ambulatory Visit: Payer: Self-pay | Admitting: Family Medicine

## 2022-05-30 ENCOUNTER — Ambulatory Visit: Payer: Medicaid Other | Admitting: Family Medicine

## 2022-06-03 DIAGNOSIS — Z419 Encounter for procedure for purposes other than remedying health state, unspecified: Secondary | ICD-10-CM | POA: Diagnosis not present

## 2022-06-06 ENCOUNTER — Telehealth (HOSPITAL_BASED_OUTPATIENT_CLINIC_OR_DEPARTMENT_OTHER): Payer: Medicaid Other | Admitting: Psychiatry

## 2022-06-06 DIAGNOSIS — F411 Generalized anxiety disorder: Secondary | ICD-10-CM | POA: Diagnosis not present

## 2022-06-06 DIAGNOSIS — F4000 Agoraphobia, unspecified: Secondary | ICD-10-CM

## 2022-06-06 DIAGNOSIS — F422 Mixed obsessional thoughts and acts: Secondary | ICD-10-CM

## 2022-06-06 DIAGNOSIS — F332 Major depressive disorder, recurrent severe without psychotic features: Secondary | ICD-10-CM

## 2022-06-06 MED ORDER — SERTRALINE HCL 50 MG PO TABS: 50.0000 mg | ORAL_TABLET | Freq: Every day | ORAL | 0 refills | Status: DC

## 2022-06-06 MED ORDER — BUSPIRONE HCL 10 MG PO TABS: 10.0000 mg | ORAL_TABLET | Freq: Two times a day (BID) | ORAL | 0 refills | Status: DC

## 2022-06-06 MED ORDER — LAMOTRIGINE 25 MG PO TABS: ORAL_TABLET | ORAL | 0 refills | Status: DC

## 2022-06-06 NOTE — Progress Notes (Signed)
Virtual Visit via Video Note  I connected with Abigail Sharp on 06/06/22 at  2:15 PM EST by  a video enabled telemedicine application and verified that I am speaking with the correct person using two identifiers.  Location: Patient: Home Provider: office   I discussed the limitations of evaluation and management by telemedicine and the availability of in person appointments. The patient expressed understanding and agreed to proceed.  History of Present Illness: "I'm doing ok. Things could be better but they are progressing". Her health insurance dropped off by the state but she got new insurance last month. The last time she took medication was a couple of months ago because of lack of health insurance. Abigail Sharp is still trying to get outside to check her mail or trash. The last time she went outside was 04/17/22. A neighbor has offered to take her trash 4 times in the last few weeks. Abigail Sharp continues to struggle with her online boyfriend. They argue a lot. They have not met again since the 1st time. She is talking to someone else online but they have never met in person. She likes him because he is supportive and very nice. She hardly sleeps. Her depression is a daily struggle and she has some crying spells. The level is usually 8-9/10 (10 being the worst). Abigail Sharp is endorsing feelings of loneliness and anhedonia. Her appetite is ok but her concentration is poor. She has ongoing negative self thoughts. She denies any SI/HI. She notes that her symptoms are worse since being off all meds.    Observations/Objective: Psychiatric Specialty Exam: ROS  There were no vitals taken for this visit.There is no height or weight on file to calculate BMI.  General Appearance: Fairly Groomed and Neat  Eye Contact:  Good  Speech:  Clear and Coherent and Normal Rate  Volume:  Normal  Mood:  Anxious and Depressed  Affect:  Congruent  Thought Process:  Coherent and Descriptions of Associations: Circumstantial   Orientation:  Full (Time, Place, and Person)  Thought Content:  Rumination  Suicidal Thoughts:  No  Homicidal Thoughts:  No  Memory:  Immediate;   Good  Judgement:  Fair  Insight:  Fair  Psychomotor Activity:  Normal  Concentration:  Concentration: Good  Recall:  Good  Fund of Knowledge:  Good  Language:  Good  Akathisia:  No  Handed:  Right  AIMS (if indicated):     Assets:  Communication Skills Desire for Improvement Financial Resources/Insurance Housing Talents/Skills Vocational/Educational  ADL's:  Intact  Cognition:  WNL  Sleep:        Assessment and Plan:     06/06/2022    2:26 PM 01/31/2022    2:11 PM 07/05/2021    1:18 PM 05/10/2021    1:08 PM 03/15/2021    1:35 PM  Depression screen PHQ 2/9  Decreased Interest _0 Down, Depressed, Hopeless _1 PHQ - 2 Score _2 Altered sleeping _3 Tired, decreased energy _4 Change in appetite 3 0 0 1 0  Feeling bad or failure about yourself  3 0 0 3 2  Trouble concentrating _5 0 2  Moving slowly or fidgety/restless 0 0 _6 Suicidal thoughts 0 0 0 0 1  PHQ-9 Score _7 Difficult doing work/chores Extremely dIfficult Extremely dIfficult Very difficult Extremely dIfficult Very difficult  Flowsheet Row Video Visit from 06/06/2022 in [facility] Video Visit from 01/31/2022 in [facility] Video Visit from 07/05/2021 in [facility]  C-SSRS RISK CATEGORY No Risk No Risk Error: Q3, 4, or 5 should not be populated when Q2 is No          Pt is aware that these meds carry a teratogenic risk. Pt will discuss plan of action if she does or plans to become pregnant in the future.  Status of current problems: worsening depression symptoms, ongoing anxiety and agoraphobia   Medication management with supportive therapy. Risks and benefits, side effects and  alternative treatment options discussed with patient. Pt was given an opportunity to ask questions about medication, illness, and treatment. All current psychiatric medications have been reviewed and discussed with the patient and adjusted as clinically appropriate.  Pt verbalized understanding and verbal consent obtained for treatment.  Meds: restart Lamictal and titrate up to 54m BID Restart Zoloft 542mpo qD Restart 1058mo BID D/c Gabapentin 1. Severe episode of recurrent major depressive disorder, without psychotic features ([place] busPIRone (BUSPAR) 10 MG tablet; Take 1 tablet (10 mg total) by mouth 2 (two) times daily.  Dispense: 180 tablet; Refill: 0 - sertraline (ZOLOFT) 50 MG tablet; Take 1 tablet (50 mg total) by mouth daily.  Dispense: 90 tablet; Refill: 0 - lamoTRIgine (LAMICTAL) 25 MG tablet; Take 1 tablet (25 mg total) by mouth daily for 14 days, THEN 1 tablet (25 mg total) 2 (two) times daily for 14 days.  Dispense: 42 tablet; Refill: 0  2. Agoraphobia - busPIRone (BUSPAR) 10 MG tablet; Take 1 tablet (10 mg total) by mouth 2 (two) times daily.  Dispense: 180 tablet; Refill: 0 - sertraline (ZOLOFT) 50 MG tablet; Take 1 tablet (50 mg total) by mouth daily.  Dispense: 90 tablet; Refill: 0  3. GAD (generalized anxiety disorder) - busPIRone (BUSPAR) 10 MG tablet; Take 1 tablet (10 mg total) by mouth 2 (two) times daily.  Dispense: 180 tablet; Refill: 0 - sertraline (ZOLOFT) 50 MG tablet; Take 1 tablet (50 mg total) by mouth daily.  Dispense: 90 tablet; Refill: 0  4. Mixed obsessional thoughts and acts - sertraline (ZOLOFT) 50 MG tablet; Take 1 tablet (50 mg total) by mouth daily.  Dispense: 90 tablet; Refill: 0    Therapy: brief supportive therapy provided. Discussed psychosocial stressors in detail.    Collaboration of Care: Other none  Patient/Guardian was advised Release of Information must be obtained prior to any record release in order to collaborate their care with an  outside provider. Patient/Guardian was advised if they have not already done so to contact the registration department to sign all necessary forms in order for us [place] release information regarding their care.   Consent: Patient/Guardian gives verbal consent for treatment and assignment of benefits for services provided during this visit. Patient/Guardian expressed understanding and agreed to proceed.     Follow Up Instructions: Follow up in 2-4 weeks or sooner if needed    I discussed the assessment and treatment plan with the patient. The patient was provided an opportunity to ask questions and all were answered. The patient agreed with the plan and demonstrated an understanding of the instructions.   The patient was advised to call back or seek an in-person evaluation if the symptoms worsen or if the condition fails to improve as anticipated.  I provided 19 minutes of non-face-to-face time during this encounter.  Charlcie Cradle, MD

## 2022-06-13 ENCOUNTER — Telehealth (HOSPITAL_COMMUNITY): Payer: Self-pay

## 2022-06-13 NOTE — Telephone Encounter (Signed)
Patient called with concerns about her medication I read notes from Dr Doyne Keel let patient know that the Gabapentin had been D/C she then wanted which medications she is prescribing and I informed her she stated that I better be telling her the truth and she would let the provider know she was swearing I let the patient know that was not necessary to use that language she continued to scream and swear I then disconnected the call.

## 2022-06-27 ENCOUNTER — Telehealth (HOSPITAL_BASED_OUTPATIENT_CLINIC_OR_DEPARTMENT_OTHER): Payer: Medicaid Other | Admitting: Psychiatry

## 2022-06-27 DIAGNOSIS — F411 Generalized anxiety disorder: Secondary | ICD-10-CM

## 2022-06-27 DIAGNOSIS — F4000 Agoraphobia, unspecified: Secondary | ICD-10-CM

## 2022-06-27 DIAGNOSIS — F332 Major depressive disorder, recurrent severe without psychotic features: Secondary | ICD-10-CM

## 2022-06-27 MED ORDER — LAMOTRIGINE 25 MG PO TABS: 25.0000 mg | ORAL_TABLET | Freq: Two times a day (BID) | ORAL | 0 refills | Status: DC

## 2022-06-27 NOTE — Progress Notes (Signed)
Virtual Visit via Video Note  I connected with Abigail Sharp on 06/27/22 at  1:45 PM EST by  a video enabled telemedicine application and verified that I am speaking with the correct person using two identifiers.  Location: Patient: Home Provider: office   I discussed the limitations of evaluation and management by telemedicine and the availability of in person appointments. The patient expressed understanding and agreed to proceed.  History of Present Illness: We last met about 3 weeks ago. At that time I restarted Lamictal, Zoloft and Buspar. She started to take Lamictal 1 week ago and denies SE. She is taking Buspar and Zoloft and denies SE. She is tolerating everything and wants to continue. Her depression is unchanged. She denies SI/HI. She continues to experience anxiety and agoraphobia.    Observations/Objective: Psychiatric Specialty Exam: ROS  There were no vitals taken for this visit.There is no height or weight on file to calculate BMI.  General Appearance: Fairly Groomed  Eye Contact:  Good  Speech:  Clear and Coherent and Normal Rate  Volume:  Normal  Mood:  Anxious and Depressed  Affect:  Full Range  Thought Process:  Goal Directed, Linear, and Descriptions of Associations: Intact  Orientation:  Full (Time, Place, and Person)  Thought Content:  Logical  Suicidal Thoughts:  No  Homicidal Thoughts:  No  Memory:  Immediate;   Good  Judgement:  Good  Insight:  Good  Psychomotor Activity:  Normal  Concentration:  Concentration: Good  Recall:  Good  Fund of Knowledge:  Good  Language:  Good  Akathisia:  No  Handed:  Right  AIMS (if indicated):     Assets:  Communication Skills Desire for Improvement Financial Resources/Insurance Housing Resilience Talents/Skills Vocational/Educational  ADL's:  Intact  Cognition:  WNL  Sleep:        Assessment and Plan:     06/06/2022    2:26 PM 01/31/2022    2:11 PM 07/05/2021    1:18 PM 05/10/2021    1:08 PM 03/15/2021     1:35 PM  Depression screen PHQ 2/9  Decreased Interest 3 3 1 3 2   Down, Depressed, Hopeless 3 3 3 3 2   PHQ - 2 Score 6 6 4 6 4   Altered sleeping 3 3 3 3 2   Tired, decreased energy 3 3 3 3 2   Change in appetite 3 0 0 1 0  Feeling bad or failure about yourself  3 0 0 3 2  Trouble concentrating 3 3 3  0 2  Moving slowly or fidgety/restless 0 0 1 3 2   Suicidal thoughts 0 0 0 0 1  PHQ-9 Score 21 15 14 19 15   Difficult doing work/chores Extremely dIfficult Extremely dIfficult Very difficult Extremely dIfficult Very difficult    Flowsheet Row Video Visit from 06/06/2022 in [facility] Video Visit from 01/31/2022 in [facility] Video Visit from 07/05/2021 in [facility]  C-SSRS RISK CATEGORY No Risk No Risk Error: Q3, 4, or 5 should not be populated when Q2 is No          Pt is aware that these meds carry a teratogenic risk. Pt will discuss plan of action if she does or plans to become pregnant in the future.  Status of current problems: ongoing depression and anxiety. She is tolerating restarting Lamictal, Zoloft and Buspar   Medication management with supportive therapy. Risks and benefits, side effects and alternative treatment options discussed with patient.  Pt was given an opportunity to ask questions about medication, illness, and treatment. All current psychiatric medications have been reviewed and discussed with the patient and adjusted as clinically appropriate.  Pt verbalized understanding and verbal consent obtained for treatment.  Meds: Continue Buspar 10mg  po BID Zoloft 50mg  po qD Lamictal titration to 25mg  BID 1. Severe episode of recurrent major depressive disorder, without psychotic features ([place]) - lamoTRIgine (LAMICTAL) 25 MG tablet; Take 1 tablet (25 mg total) by mouth 2 (two) times daily.  Dispense: 180 tablet; Refill: 0  2. Agoraphobia  3. GAD  (generalized anxiety disorder)     Labs: none    Therapy: brief supportive therapy provided. Discussed psychosocial stressors in detail.    Collaboration of Care: Other none  Patient/Guardian was advised Release of Information must be obtained prior to any record release in order to collaborate their care with an outside provider. Patient/Guardian was advised if they have not already done so to contact the registration department to sign all necessary forms in order for [place] to release information regarding their care.   Consent: Patient/Guardian gives verbal consent for treatment and assignment of benefits for services provided during this visit. Patient/Guardian expressed understanding and agreed to proceed.      Follow Up Instructions: Follow up in 2 months or sooner if needed    I discussed the assessment and treatment plan with the patient. The patient was provided an opportunity to ask questions and all were answered. The patient agreed with the plan and demonstrated an understanding of the instructions.   The patient was advised to call back or seek an in-person evaluation if the symptoms worsen or if the condition fails to improve as anticipated.  I provided 9 minutes of non-face-to-face time during this encounter.   Charlcie Cradle, MD

## 2022-07-04 DIAGNOSIS — Z419 Encounter for procedure for purposes other than remedying health state, unspecified: Secondary | ICD-10-CM | POA: Diagnosis not present

## 2022-08-02 DIAGNOSIS — Z419 Encounter for procedure for purposes other than remedying health state, unspecified: Secondary | ICD-10-CM | POA: Diagnosis not present

## 2022-08-22 ENCOUNTER — Telehealth (HOSPITAL_BASED_OUTPATIENT_CLINIC_OR_DEPARTMENT_OTHER): Payer: Medicaid Other | Admitting: Psychiatry

## 2022-08-22 DIAGNOSIS — F4000 Agoraphobia, unspecified: Secondary | ICD-10-CM | POA: Diagnosis not present

## 2022-08-22 DIAGNOSIS — F332 Major depressive disorder, recurrent severe without psychotic features: Secondary | ICD-10-CM

## 2022-08-22 DIAGNOSIS — F422 Mixed obsessional thoughts and acts: Secondary | ICD-10-CM

## 2022-08-22 DIAGNOSIS — F411 Generalized anxiety disorder: Secondary | ICD-10-CM

## 2022-08-22 MED ORDER — LAMOTRIGINE 25 MG PO TABS: 25.0000 mg | ORAL_TABLET | Freq: Two times a day (BID) | ORAL | 0 refills | Status: DC

## 2022-08-22 MED ORDER — BUSPIRONE HCL 10 MG PO TABS: 10.0000 mg | ORAL_TABLET | Freq: Two times a day (BID) | ORAL | 0 refills | Status: DC

## 2022-08-22 MED ORDER — SERTRALINE HCL 100 MG PO TABS: 100.0000 mg | ORAL_TABLET | Freq: Every day | ORAL | 0 refills | Status: DC

## 2022-08-22 NOTE — Progress Notes (Signed)
Virtual Visit via Video Note  I connected with Abigail Sharp on 08/22/22 at  2:30 PM EDT by  a video enabled telemedicine application and verified that I am speaking with the correct person using two identifiers.  Location: Patient: Home Provider: office   I discussed the limitations of evaluation and management by telemedicine and the availability of in person appointments. The patient expressed understanding and agreed to proceed.  History of Present Illness: "I'm doing ok". Abigail Sharp shares that everything is pretty much the same since our last visit. Most days she feels depressed and the level is 8/10 (10 being the worst). Abigail Sharp has low motivation to do anything. She is endorsing anhedonia and negative self thoughts. She admits to some passive thoughts of death a couple of times a week. She denies SI/HI. Abigail Sharp has not left her apartment in over 2 months. Her anxiety is ongoing. She is always anxious and is very sensitive to loud noises. Abigail Sharp is endorsing racing thoughts, poor focus and restlessness.  Her sleep is ok but energy is on the low side. She broke up with her online boyfriend in January 2024. She is still getting over the break up.  She is talking to a new guy but is unsure if she wants to pursue it.     Observations/Objective: Psychiatric Specialty Exam: ROS  There were no vitals taken for this visit.There is no height or weight on file to calculate BMI.  General Appearance: Casual and Disheveled  Eye Contact:  Good  Speech:  Clear and Coherent and Normal Rate  Volume:  Normal  Mood:  Anxious and Depressed  Affect:  Congruent  Thought Process:  Coherent and Descriptions of Associations: Circumstantial  Orientation:  Full (Time, Place, and Person)  Thought Content:  Rumination  Suicidal Thoughts:  No  Homicidal Thoughts:  No  Memory:  Immediate;   Good  Judgement:  Fair  Insight:  Fair  Psychomotor Activity:  Normal  Concentration:  Concentration: Fair  Recall:  Tribune of Knowledge:  Good  Language:  Good  Akathisia:  No  Handed:  Right  AIMS (if indicated):     Assets:  Communication Skills Desire for Improvement Financial Resources/Insurance Housing Talents/Skills Vocational/Educational  ADL's:  Intact  Cognition:  WNL  Sleep:        Assessment and Plan:     08/22/2022    2:35 PM 06/06/2022    2:26 PM 01/31/2022    2:11 PM 07/05/2021    1:18 PM 05/10/2021    1:08 PM  Depression screen PHQ 2/9  Decreased Interest 2 3 3 1 3   Down, Depressed, Hopeless 3 3 3 3 3   PHQ - 2 Score 5 6 6 4 6   Altered sleeping 2 3 3 3 3   Tired, decreased energy 3 3 3 3 3   Change in appetite 3 3 0 0 1  Feeling bad or failure about yourself  3 3 0 0 3  Trouble concentrating 3 3 3 3  0  Moving slowly or fidgety/restless 0 0 0 1 3  Suicidal thoughts 1 0 0 0 0  PHQ-9 Score 20 21 15 14 19   Difficult doing work/chores Extremely dIfficult Extremely dIfficult Extremely dIfficult Very difficult Extremely dIfficult    Flowsheet Row Video Visit from 08/22/2022 in [facility] Video Visit from 06/06/2022 in [facility] Video Visit from 01/31/2022 in [place] Error: Q3, 4, or 5 should  not be populated when Q2 is No No Risk No Risk          Pt is aware that these meds carry a teratogenic risk. Pt will discuss plan of action if she does or plans to become pregnant in the future.  Status of current problems: ongoing depression, anxiety and agoraphobia   Medication management with supportive therapy. Risks and benefits, side effects and alternative treatment options discussed with patient. Pt was given an opportunity to ask questions about medication, illness, and treatment. All current psychiatric medications have been reviewed and discussed with the patient and adjusted as clinically appropriate.  Pt verbalized understanding and  verbal consent obtained for treatment.  Meds: increase Zoloft 100mg  po qD 1. Agoraphobia - busPIRone (BUSPAR) 10 MG tablet; Take 1 tablet (10 mg total) by mouth 2 (two) times daily.  Dispense: 180 tablet; Refill: 0 - sertraline (ZOLOFT) 100 MG tablet; Take 1 tablet (100 mg total) by mouth daily.  Dispense: 90 tablet; Refill: 0  2. GAD (generalized anxiety disorder) - busPIRone (BUSPAR) 10 MG tablet; Take 1 tablet (10 mg total) by mouth 2 (two) times daily.  Dispense: 180 tablet; Refill: 0 - sertraline (ZOLOFT) 100 MG tablet; Take 1 tablet (100 mg total) by mouth daily.  Dispense: 90 tablet; Refill: 0  3. Severe episode of recurrent major depressive disorder, without psychotic features ([place]) - busPIRone (BUSPAR) 10 MG tablet; Take 1 tablet (10 mg total) by mouth 2 (two) times daily.  Dispense: 180 tablet; Refill: 0 - lamoTRIgine (LAMICTAL) 25 MG tablet; Take 1 tablet (25 mg total) by mouth 2 (two) times daily.  Dispense: 180 tablet; Refill: 0 - sertraline (ZOLOFT) 100 MG tablet; Take 1 tablet (100 mg total) by mouth daily.  Dispense: 90 tablet; Refill: 0  4. Mixed obsessional thoughts and acts - sertraline (ZOLOFT) 100 MG tablet; Take 1 tablet (100 mg total) by mouth daily.  Dispense: 90 tablet; Refill: 0     Labs: none    Therapy: brief supportive therapy provided. Discussed psychosocial stressors in detail.    Collaboration of Care: Other none  Patient/Guardian was advised Release of Information must be obtained prior to any record release in order to collaborate their care with an outside provider. Patient/Guardian was advised if they have not already done so to contact the registration department to sign all necessary forms in order for [place] to release information regarding their care.   Consent: Patient/Guardian gives verbal consent for treatment and assignment of benefits for services provided during this visit. Patient/Guardian expressed understanding and agreed to proceed.       Follow Up Instructions: Follow up in 3 months or sooner if needed    I discussed the assessment and treatment plan with the patient. The patient was provided an opportunity to ask questions and all were answered. The patient agreed with the plan and demonstrated an understanding of the instructions.   The patient was advised to call back or seek an in-person evaluation if the symptoms worsen or if the condition fails to improve as anticipated.  I provided 17 minutes of non-face-to-face time during this encounter.   Charlcie Cradle, MD

## 2022-09-02 DIAGNOSIS — Z419 Encounter for procedure for purposes other than remedying health state, unspecified: Secondary | ICD-10-CM | POA: Diagnosis not present

## 2022-10-02 DIAGNOSIS — Z419 Encounter for procedure for purposes other than remedying health state, unspecified: Secondary | ICD-10-CM | POA: Diagnosis not present

## 2022-10-07 ENCOUNTER — Encounter (HOSPITAL_COMMUNITY): Payer: Self-pay

## 2022-11-02 DIAGNOSIS — Z419 Encounter for procedure for purposes other than remedying health state, unspecified: Secondary | ICD-10-CM | POA: Diagnosis not present

## 2022-11-21 ENCOUNTER — Telehealth (HOSPITAL_BASED_OUTPATIENT_CLINIC_OR_DEPARTMENT_OTHER): Payer: Medicaid Other | Admitting: Psychiatry

## 2022-11-21 DIAGNOSIS — F422 Mixed obsessional thoughts and acts: Secondary | ICD-10-CM

## 2022-11-21 DIAGNOSIS — F332 Major depressive disorder, recurrent severe without psychotic features: Secondary | ICD-10-CM

## 2022-11-21 DIAGNOSIS — F4000 Agoraphobia, unspecified: Secondary | ICD-10-CM | POA: Diagnosis not present

## 2022-11-21 DIAGNOSIS — F411 Generalized anxiety disorder: Secondary | ICD-10-CM | POA: Diagnosis not present

## 2022-11-21 MED ORDER — SERTRALINE HCL 100 MG PO TABS: 150.0000 mg | ORAL_TABLET | Freq: Every day | ORAL | 0 refills | Status: DC

## 2022-11-21 MED ORDER — BUSPIRONE HCL 10 MG PO TABS: 10.0000 mg | ORAL_TABLET | Freq: Two times a day (BID) | ORAL | 0 refills | Status: DC

## 2022-11-21 MED ORDER — LAMOTRIGINE 25 MG PO TABS: 25.0000 mg | ORAL_TABLET | Freq: Two times a day (BID) | ORAL | 0 refills | Status: DC
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
## 2022-12-02 DIAGNOSIS — Z419 Encounter for procedure for purposes other than remedying health state, unspecified: Secondary | ICD-10-CM | POA: Diagnosis not present

## 2023-01-02 DIAGNOSIS — Z419 Encounter for procedure for purposes other than remedying health state, unspecified: Secondary | ICD-10-CM | POA: Diagnosis not present
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
## 2023-01-21 ENCOUNTER — Telehealth (HOSPITAL_BASED_OUTPATIENT_CLINIC_OR_DEPARTMENT_OTHER): Payer: Medicaid Other | Admitting: Psychiatry

## 2023-01-21 ENCOUNTER — Encounter (HOSPITAL_COMMUNITY): Payer: Self-pay | Admitting: Psychiatry

## 2023-01-21 DIAGNOSIS — F422 Mixed obsessional thoughts and acts: Secondary | ICD-10-CM

## 2023-01-21 DIAGNOSIS — F4000 Agoraphobia, unspecified: Secondary | ICD-10-CM

## 2023-01-21 DIAGNOSIS — F411 Generalized anxiety disorder: Secondary | ICD-10-CM

## 2023-01-21 DIAGNOSIS — F332 Major depressive disorder, recurrent severe without psychotic features: Secondary | ICD-10-CM | POA: Diagnosis not present

## 2023-01-21 MED ORDER — LAMOTRIGINE 25 MG PO TABS
25.0000 mg | ORAL_TABLET | Freq: Two times a day (BID) | ORAL | 0 refills | Status: DC
Start: 2023-01-21 — End: 2023-03-20

## 2023-01-21 MED ORDER — ARIPIPRAZOLE 2 MG PO TABS
2.0000 mg | ORAL_TABLET | Freq: Every day | ORAL | 0 refills | Status: DC
Start: 2023-01-21 — End: 2023-03-20

## 2023-01-21 MED ORDER — ARIPIPRAZOLE 2 MG PO TABS: 2.0000 mg | ORAL_TABLET | Freq: Every day | ORAL | 2 refills | Status: DC

## 2023-01-21 MED ORDER — SERTRALINE HCL 100 MG PO TABS
200.0000 mg | ORAL_TABLET | Freq: Every day | ORAL | 0 refills | Status: DC
Start: 2023-01-21 — End: 2023-03-20

## 2023-01-21 MED ORDER — BUSPIRONE HCL 10 MG PO TABS
10.0000 mg | ORAL_TABLET | Freq: Two times a day (BID) | ORAL | 0 refills | Status: DC
Start: 2023-01-21 — End: 2023-03-20

## 2023-02-02 DIAGNOSIS — Z419 Encounter for procedure for purposes other than remedying health state, unspecified: Secondary | ICD-10-CM | POA: Diagnosis not present

## 2023-03-04 DIAGNOSIS — Z419 Encounter for procedure for purposes other than remedying health state, unspecified: Secondary | ICD-10-CM | POA: Diagnosis not present

## 2023-03-19 NOTE — Progress Notes (Unsigned)
BH MD/PA/NP OP Progress Note  03/20/2023 1:59 PM Abigail Sharp  MRN:  [id number]  Visit Diagnosis:    ICD-10-CM   1. GAD (generalized anxiety disorder)  F41.1 ARIPiprazole (ABILIFY) 5 MG tablet    busPIRone (BUSPAR) 10 MG tablet    sertraline (ZOLOFT) 100 MG tablet    2. Severe episode of recurrent major depressive disorder, without psychotic features (HCC)  F33.2 ARIPiprazole (ABILIFY) 5 MG tablet    busPIRone (BUSPAR) 10 MG tablet    lamoTRIgine (LAMICTAL) 25 MG tablet    sertraline (ZOLOFT) 100 MG tablet    3. Mixed obsessional thoughts and acts  F42.2 ARIPiprazole (ABILIFY) 5 MG tablet    sertraline (ZOLOFT) 100 MG tablet    4. Agoraphobia  F40.00 busPIRone (BUSPAR) 10 MG tablet    sertraline (ZOLOFT) 100 MG tablet      Assessment: Abigail Sharp is a 57 y.o. female with a history of GAD, MDD, OCD, agoraphobia, and CHF who presented to [facility] for initial evaluation on 01/21/2023 following transition from Dr. Michae Kava.  At initial evaluation patient reported significant symptoms of anxiety including excessive worry, ruminations, racing thoughts, increased irritability, restlessness, and fears something awful happening.  She can also experience panic like symptoms with body tremors and shortness of breath when triggered by leaving or having thoughts of leaving the house.  Patient endorsed a history of obsessional thoughts primarily around cleanliness and excessive handwashing.  While still present this had improved compared to the past.  With the excessive anxiety leading to isolation patient has in turn had increased depression.  She endorsed low mood, anhedonia, feelings of worthlessness, feelings of hopelessness, disturbed sleep, and poor appetite.  She denies any SI/HI or thoughts of self-harm.  Patient met criteria for MDD, GAD, OCD, and agoraphobia.  Abigail Sharp presents for follow-up evaluation. Today, 03/20/23, patient reports no  significant change in her anxiety/depression over the past couple months. She has taken increase in sertraline as well as the initiation of Abilify and denies any adverse side effects.  Outside of some improvement in sleep she denies any improvement from the medication.  We will titrate Abilify to 5 mg today and risk and benefits were reviewed.  We also discussed behavioral activation techniques which we encourage patient to work on.  Plan: - Continue Zoloft 200 mg QD - Continue Lamictal 25 mg BID - Continue BuSpar 10 mg BID - Increase Abilify 5 mg daily - Therapy recommended, patient declined - Crisis resources reviewed - Follow up in 2 months  Chief Complaint:  Chief Complaint  Patient presents with   Follow-up   HPI: Abigail Sharp reports that things that have been about the same over the past couple months. She is taking the medication consistently, an had noticed a bit of tingling in her arms when she would wake up and try to fall back asleep. It only happened once after starting the medication and has not occurred again. She is not sure of any significant positive with medication changes either. Later she did report that she feels that she is sleeping a bit better. Though on days she feels low she can spend more time in bed.  Abigail Sharp reports that her anxiety and depression are about the same. She is still isolating at home and not leaving the house. She stopped talking with the man she had been speaking with around a week ago. Abigail Sharp expressed frustration with his lack of sharing anything about himself. She is wondering if she  needs him in her life at all. Abigail Sharp describes a feeling of having no troubles or worries since this happened. At the same time without talking to him she has not had much else going on in her life. The two of them have still not met in person.  Encouraged patient to work towards engaging in behavioral activation, particularly stepping outside of her front door to pick up the  groceries that were delivered. Also discussed increasing Abilify to 5 mg today which patient was in agreement with. Risks and benefits were reviewed.   Past Psychiatric History:  Dx: Depression and Anxiety (agoraphobia) Meds: Celexa, Zoloft, BuSpar, Lamictal, Depakote, gabapentin, trazodone Previous psychiatrist/therapist: Dr. Michae Kava 2017-2024, PCP was prescribing Celexa Hospitalizations: denies SIB: denies Suicide attempts: denies Hx of violent behavior towards others: denies Current access to guns: denies Hx of abuse:  Military Hx: denies Hx of Seizures: denies Hx of TBI: denies   Past Medical History:  Past Medical History:  Diagnosis Date   Anxiety    Chronic systolic heart failure (HCC)    Depression    HTN (hypertension)    Microcytic anemia    NICM (nonischemic cardiomyopathy) (HCC) 11/2014   EF is 25% improved to 55% in 2015.   Pre-diabetes     Past Surgical History:  Procedure Laterality Date   CARDIAC CATHETERIZATION N/A 11/07/2014   Procedure: Right/Left Heart Cath and Coronary Angiography;  Surgeon: Runell Gess, MD;  Location: [facility] INVASIVE CV LAB;  Service: Cardiovascular;  Laterality: N/A;   TONSILLECTOMY     TUBAL LIGATION      Family History:  Family History  Problem Relation Age of Onset   Diabetes Mother    Hypertension Mother    Diabetes Father    Heart disease Father    Hypertension Father    Diabetes Sister    ADD / ADHD Son    Alcohol abuse Neg Hx    Anxiety disorder Neg Hx    Bipolar disorder Neg Hx    Depression Neg Hx    Drug abuse Neg Hx     Social History:  Social History   Socioeconomic History   Marital status: Single    Spouse name: Not on file   Number of children: Not on file   Years of education: Not on file   Highest education level: Not on file  Occupational History   Not on file  Tobacco Use   Smoking status: Never   Smokeless tobacco: Never  Vaping Use   Vaping status: Never Used  Substance and Sexual  Activity   Alcohol use: No    Alcohol/week: 0.0 standard drinks of alcohol   Drug use: No   Sexual activity: Never  Other Topics Concern   Not on file  Social History Narrative   Social Hx:   Current living situation: Lives alone in apt in [place]: [place], [place] by parents.   Raised by parents. Parents let her do whatever she wanted to do while growing up. Both parents now deceased.    Siblings 1 brother and 2 sisters. Pt is number 3   Schooling- HS grad, faced a lot of bullied due to teen pregnancy at 35. Lived with parents while raising her oldest child but for the youngest she moved out temporarily. Her mom took over raising him at the age of 55 mo.    Employed- last job in 2002. Worked at [employer] for 2.5 yrs but got fired   Married- never employed  Kids- 2 boys   Legal issues- jail x2 for not paying child support            Social Determinants of Health   Financial Resource Strain: Not on file  Food Insecurity: Not on file  Transportation Needs: Not on file  Physical Activity: Not on file  Stress: Not on file  Social Connections: Not on file    Allergies:  Allergies  Allergen Reactions   Sulfa Antibiotics Other (See Comments)    unknown    Current Medications: Current Outpatient Medications  Medication Sig Dispense Refill   ARIPiprazole (ABILIFY) 5 MG tablet Take 1 tablet (5 mg total) by mouth daily. 90 tablet 0   aspirin 81 MG EC tablet Take 1 tablet (81 mg total) by mouth daily. 90 tablet 3   busPIRone (BUSPAR) 10 MG tablet Take 1 tablet (10 mg total) by mouth 2 (two) times daily. 180 tablet 0   carvedilol (COREG) 6.25 MG tablet Take 1 tablet (6.25 mg total) by mouth 2 (two) times daily with a meal. 60 tablet 1   lamoTRIgine (LAMICTAL) 25 MG tablet Take 1 tablet (25 mg total) by mouth 2 (two) times daily. 180 tablet 0   sertraline (ZOLOFT) 100 MG tablet Take 2 tablets (200 mg total) by mouth daily. 180 tablet 0   spironolactone (ALDACTONE) 25 MG tablet Take  0.5 tablets (12.5 mg total) by mouth daily. 15 tablet 1   No current facility-administered medications for this visit.     Psychiatric Specialty Exam: Review of Systems  There were no vitals taken for this visit.There is no height or weight on file to calculate BMI.  General Appearance: Fairly Groomed  Eye Contact:  Fair  Speech:  Normal Rate  Volume:  Normal  Mood:  Anxious and Depressed  Affect:  Congruent  Thought Process:  Coherent  Orientation:  Full (Time, Place, and Person)  Thought Content: Logical and Illogical   Suicidal Thoughts:  No  Homicidal Thoughts:  No  Memory:  Immediate;   Fair  Judgement:  Impaired  Insight:  Lacking  Psychomotor Activity:  Decreased  Concentration:  Concentration: Fair  Recall:  Fair  Fund of Knowledge: Fair  Language: Good  Akathisia:  No    AIMS (if indicated): not done  Assets:  Communication Skills Housing  ADL's:  Intact  Cognition: WNL  Sleep:  Good   Metabolic Disorder Labs: Lab Results  Component Value Date   HGBA1C 5.7 (H) 11/05/2014   MPG 117 11/05/2014   No results found for: "PROLACTIN" Lab Results  Component Value Date   CHOL 189 08/23/2015   TRIG 290 (H) 08/23/2015   HDL 36 (L) 08/23/2015   CHOLHDL 5.3 (H) 08/23/2015   VLDL 58 (H) 08/23/2015   LDLCALC 95 08/23/2015   LDLCALC 79 11/05/2014   Lab Results  Component Value Date   TSH 3.33 08/23/2015   TSH 1.192 11/04/2014    Therapeutic Level Labs: No results found for: "LITHIUM" No results found for: "VALPROATE" No results found for: "CBMZ"   Screenings: GAD-7    Flowsheet Row Video Visit from 01/21/2023 in [facility] Office Visit from 04/17/2016 in [facility] Office Visit from 11/21/2015 in [facility] Office Visit from 08/28/2015 in [facility]  Total GAD-7 Score 14 8 13 16       PHQ2-9    Flowsheet  Row Video Visit from 01/21/2023  in [facility] Video Visit from 11[facility]21/2022 in [facility] Video Visit from 08/22/2022 in [facility] Video Visit from 06/06/2022 in [facility] Video Visit from 01/31/2022 in [facility]  PHQ-2 Total Score 5 5 5 6 6   PHQ-9 Total Score 18 19 20 21 15       Flowsheet Row Video Visit from 01/21/2023 in [facility] Video Visit from 11/21/2022 in [facility] Video Visit from 08/22/2022 in [facility]  C-SSRS RISK CATEGORY No Risk No Risk Error: Q3, 4, or 5 should not be populated when Q2 is No       Collaboration of Care: Collaboration of Care: Medication Management AEB medication prescription  Patient/Guardian was advised Release of Information must be obtained prior to any record release in order to collaborate their care with an outside provider. Patient/Guardian was advised if they have not already done so to contact the registration department to sign all necessary forms in order for [place] to release information regarding their care.   Consent: Patient/Guardian gives verbal consent for treatment and assignment of benefits for services provided during this visit. Patient/Guardian expressed understanding and agreed to proceed.    Stasia Cavalier, MD 03/20/2023, 1:59 PM   Virtual Visit via Video Note  I connected with Abigail Sharp on 03/20/23 at  1:00 PM EDT by a video enabled telemedicine application and verified that I am speaking with the correct person using two identifiers.  Location: Patient: Home Provider: Home Office   I discussed the limitations of evaluation and management by telemedicine and the availability of in person appointments. The  patient expressed understanding and agreed to proceed.   I discussed the assessment and treatment plan with the patient. The patient was provided an opportunity to ask questions and all were answered. The patient agreed with the plan and demonstrated an understanding of the instructions.   The patient was advised to call back or seek an in-person evaluation if the symptoms worsen or if the condition fails to improve as anticipated.  40 minutes were spent in chart review, interview, psycho education, counseling, medical decision making, coordination of care and long-term prognosis.  Patient was given opportunity to ask question and all concerns and questions were addressed and answers. Excluding separately billable services.  Stasia Cavalier, MD

## 2023-03-20 ENCOUNTER — Encounter (HOSPITAL_COMMUNITY): Payer: Self-pay | Admitting: Psychiatry

## 2023-03-20 ENCOUNTER — Telehealth (HOSPITAL_COMMUNITY): Payer: Medicaid Other | Admitting: Psychiatry

## 2023-03-20 DIAGNOSIS — F422 Mixed obsessional thoughts and acts: Secondary | ICD-10-CM | POA: Diagnosis not present

## 2023-03-20 DIAGNOSIS — F411 Generalized anxiety disorder: Secondary | ICD-10-CM

## 2023-03-20 DIAGNOSIS — F4000 Agoraphobia, unspecified: Secondary | ICD-10-CM | POA: Diagnosis not present

## 2023-03-20 DIAGNOSIS — F332 Major depressive disorder, recurrent severe without psychotic features: Secondary | ICD-10-CM

## 2023-03-20 MED ORDER — BUSPIRONE HCL 10 MG PO TABS
10.0000 mg | ORAL_TABLET | Freq: Two times a day (BID) | ORAL | 0 refills | Status: DC
Start: 2023-03-20 — End: 2023-05-15

## 2023-03-20 MED ORDER — ARIPIPRAZOLE 5 MG PO TABS
5.0000 mg | ORAL_TABLET | Freq: Every day | ORAL | 0 refills | Status: DC
Start: 2023-03-20 — End: 2023-06-30

## 2023-03-20 MED ORDER — LAMOTRIGINE 25 MG PO TABS
25.0000 mg | ORAL_TABLET | Freq: Two times a day (BID) | ORAL | 0 refills | Status: DC
Start: 2023-03-20 — End: 2023-05-15

## 2023-03-20 MED ORDER — SERTRALINE HCL 100 MG PO TABS
200.0000 mg | ORAL_TABLET | Freq: Every day | ORAL | 0 refills | Status: DC
Start: 2023-03-20 — End: 2023-05-15

## 2023-04-04 DIAGNOSIS — Z419 Encounter for procedure for purposes other than remedying health state, unspecified: Secondary | ICD-10-CM | POA: Diagnosis not present

## 2023-05-04 DIAGNOSIS — Z419 Encounter for procedure for purposes other than remedying health state, unspecified: Secondary | ICD-10-CM | POA: Diagnosis not present

## 2023-05-15 ENCOUNTER — Telehealth (HOSPITAL_COMMUNITY): Payer: Medicaid Other | Admitting: Psychiatry

## 2023-05-15 ENCOUNTER — Encounter (HOSPITAL_COMMUNITY): Payer: Self-pay | Admitting: Psychiatry

## 2023-05-15 DIAGNOSIS — F411 Generalized anxiety disorder: Secondary | ICD-10-CM

## 2023-05-15 DIAGNOSIS — F4000 Agoraphobia, unspecified: Secondary | ICD-10-CM | POA: Diagnosis not present

## 2023-05-15 DIAGNOSIS — F332 Major depressive disorder, recurrent severe without psychotic features: Secondary | ICD-10-CM | POA: Diagnosis not present

## 2023-05-15 DIAGNOSIS — F422 Mixed obsessional thoughts and acts: Secondary | ICD-10-CM

## 2023-05-15 MED ORDER — LAMOTRIGINE 25 MG PO TABS
25.0000 mg | ORAL_TABLET | Freq: Two times a day (BID) | ORAL | 0 refills | Status: DC
Start: 2023-05-15 — End: 2023-07-17

## 2023-05-15 MED ORDER — BUSPIRONE HCL 10 MG PO TABS
10.0000 mg | ORAL_TABLET | Freq: Two times a day (BID) | ORAL | 0 refills | Status: DC
Start: 2023-05-15 — End: 2023-07-17

## 2023-05-15 MED ORDER — SERTRALINE HCL 100 MG PO TABS
200.0000 mg | ORAL_TABLET | Freq: Every day | ORAL | 0 refills | Status: DC
Start: 2023-05-15 — End: 2023-07-17

## 2023-05-15 NOTE — Progress Notes (Signed)
BH MD/PA/NP OP Progress Note  05/15/2023 5:11 PM Abigail Sharp  MRN:  [id number]  Visit Diagnosis:    ICD-10-CM   1. GAD (generalized anxiety disorder)  F41.1 busPIRone (BUSPAR) 10 MG tablet    sertraline (ZOLOFT) 100 MG tablet    2. Severe episode of recurrent major depressive disorder, without psychotic features (HCC)  F33.2 busPIRone (BUSPAR) 10 MG tablet    lamoTRIgine (LAMICTAL) 25 MG tablet    sertraline (ZOLOFT) 100 MG tablet    3. Mixed obsessional thoughts and acts  F42.2 sertraline (ZOLOFT) 100 MG tablet    4. Agoraphobia  F40.00 busPIRone (BUSPAR) 10 MG tablet    sertraline (ZOLOFT) 100 MG tablet      Assessment: Abigail Sharp is a 57 y.o. female with a history of GAD, MDD, OCD, agoraphobia, and CHF who presented to [facility] at [facility] for initial evaluation on 01/21/2023 following transition from Dr. Michae Kava.  At initial evaluation patient reported significant symptoms of anxiety including excessive worry, ruminations, racing thoughts, increased irritability, restlessness, and fears something awful happening.  She can also experience panic like symptoms with body tremors and shortness of breath when triggered by leaving or having thoughts of leaving the house.  Patient endorsed a history of obsessional thoughts primarily around cleanliness and excessive handwashing.  While still present this had improved compared to the past.  With the excessive anxiety leading to isolation patient has in turn had increased depression.  She endorsed low mood, anhedonia, feelings of worthlessness, feelings of hopelessness, disturbed sleep, and poor appetite.  She denies any SI/HI or thoughts of self-harm.  Patient met criteria for MDD, GAD, OCD, and agoraphobia.  Abigail Sharp presents for follow-up evaluation. Today, 05/15/23, patient reports no significant change in her anxiety/depression over the past couple months.  She continues to isolate her she does not leave the  house.  She has been able to talk to a few people over the phone.  Patient denies any significant change in symptoms with the titration of Abilify.  She denies any adverse side effects.  We will continue on her current regimen at this time.  After much discussion patient did agree to retry therapy.  We will follow up in 2 months.  Plan: - Continue Zoloft 200 mg QD - Continue Lamictal 25 mg BID - Continue BuSpar 10 mg BID - Increase Abilify 5 mg daily - Therapy recommended, patient declined - Crisis resources reviewed - Follow up in 2 months  Chief Complaint:  Chief Complaint  Patient presents with   Follow-up   HPI: Abigail Sharp reports that she is moody today. There had been someone that she met recently who she had spoken with the last 3 weeks, but this morning he told her that he can not go forward and needs to take care of himself right now. Abigail Sharp does not want to give up on him but notes she has blocked him on multiple platforms. We discussed boundaries and our inability to control anyone but ourselves. Abigail Sharp wants to have a boyfriend and the conversation that goes with it. She wants to have a boyfriend who would take her out of the house. We discussed this and reminded her that she does not need to have a partner to leave the house. Motivational interviewing techniques were used to encourage patient to leave the house. We also encouraged therapy again today.   One of the neighbors that had been difficult with her passed away last month. There are still two others  around her that can be difficult. Abigail Sharp notes that there has been peace in the amount of noise from next door since she passed.   Past Psychiatric History:  Dx: Depression and Anxiety (agoraphobia) Meds: Celexa, Zoloft, BuSpar, Lamictal, Depakote, gabapentin, trazodone Previous psychiatrist/therapist: Dr. Michae Kava 2017-2024, PCP was prescribing Celexa Hospitalizations: denies SIB: denies Suicide attempts: denies Hx of violent  behavior towards others: denies Current access to guns: denies Hx of abuse:  Military Hx: denies Hx of Seizures: denies Hx of TBI: denies   Past Medical History:  Past Medical History:  Diagnosis Date   Anxiety    Chronic systolic heart failure (HCC)    Depression    HTN (hypertension)    Microcytic anemia    NICM (nonischemic cardiomyopathy) (HCC) 11/2014   EF is 25% improved to 55% in 2015.   Pre-diabetes     Past Surgical History:  Procedure Laterality Date   CARDIAC CATHETERIZATION N/A 11/07/2014   Procedure: Right/Left Heart Cath and Coronary Angiography;  Surgeon: Runell Gess, MD;  Location: [facility] INVASIVE CV LAB;  Service: Cardiovascular;  Laterality: N/A;   TONSILLECTOMY     TUBAL LIGATION      Family History:  Family History  Problem Relation Age of Onset   Diabetes Mother    Hypertension Mother    Diabetes Father    Heart disease Father    Hypertension Father    Diabetes Sister    ADD / ADHD Son    Alcohol abuse Neg Hx    Anxiety disorder Neg Hx    Bipolar disorder Neg Hx    Depression Neg Hx    Drug abuse Neg Hx     Social History:  Social History   Socioeconomic History   Marital status: Single    Spouse name: Not on file   Number of children: Not on file   Years of education: Not on file   Highest education level: Not on file  Occupational History   Not on file  Tobacco Use   Smoking status: Never   Smokeless tobacco: Never  Vaping Use   Vaping status: Never Used  Substance and Sexual Activity   Alcohol use: No    Alcohol/week: 0.0 standard drinks of alcohol   Drug use: No   Sexual activity: Never  Other Topics Concern   Not on file  Social History Narrative   Social Hx:   Current living situation: Lives alone in apt in [place]: [place], [place] by parents.   Raised by parents. Parents let her do whatever she wanted to do while growing up. Both parents now deceased.    Siblings 1 brother and 2 sisters. Pt is number 3   Schooling- HS  grad, faced a lot of bullied due to teen pregnancy at 53. Lived with parents while raising her oldest child but for the youngest she moved out temporarily. Her mom took over raising him at the age of 74 mo.    Employed- last job in 2002. 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            Social Drivers of Corporate investment banker Strain: Not on [employer] Insecurity: Not on file  Transportation Needs: Not on file  Physical Activity: Not on file  Stress: Not on file  Social Connections: Not on file  Allergies:  Allergies  Allergen Reactions   Sulfa Antibiotics Other (See Comments)    unknown    Current Medications: Current Outpatient Medications  Medication Sig Dispense Refill   ARIPiprazole (ABILIFY) 5 MG tablet Take 1 tablet (5 mg total) by mouth daily. 90 tablet 0   aspirin 81 MG EC tablet Take 1 tablet (81 mg total) by mouth daily. 90 tablet 3   busPIRone (BUSPAR) 10 MG tablet Take 1 tablet (10 mg total) by mouth 2 (two) times daily. 180 tablet 0   carvedilol (COREG) 6.25 MG tablet Take 1 tablet (6.25 mg total) by mouth 2 (two) times daily with a meal. 60 tablet 1   lamoTRIgine (LAMICTAL) 25 MG tablet Take 1 tablet (25 mg total) by mouth 2 (two) times daily. 180 tablet 0   sertraline (ZOLOFT) 100 MG tablet Take 2 tablets (200 mg total) by mouth daily. 180 tablet 0   spironolactone (ALDACTONE) 25 MG tablet Take 0.5 tablets (12.5 mg total) by mouth daily. 15 tablet 1   No current facility-administered medications for this visit.     Psychiatric Specialty Exam: Review of Systems  There were no vitals taken for this visit.There is no height or weight on file to calculate BMI.  General Appearance: Fairly Groomed  Eye Contact:  Fair  Speech:  Normal Rate  Volume:  Normal  Mood:  Anxious and Depressed  Affect:  Congruent  Thought Process:  Coherent  Orientation:   Full (Time, Place, and Person)  Thought Content: Logical and Illogical   Suicidal Thoughts:  No  Homicidal Thoughts:  No  Memory:  Immediate;   Fair  Judgement:  Impaired  Insight:  Lacking  Psychomotor Activity:  Decreased  Concentration:  Concentration: Fair  Recall:  Fair  Fund of Knowledge: Fair  Language: Good  Akathisia:  No    AIMS (if indicated): not done  Assets:  Communication Skills Housing  ADL's:  Intact  Cognition: WNL  Sleep:  Good   Metabolic Disorder Labs: Lab Results  Component Value Date   HGBA1C 5.7 (H) 11/05/2014   MPG 117 11/05/2014   No results found for: "PROLACTIN" Lab Results  Component Value Date   CHOL 189 08/23/2015   TRIG 290 (H) 08/23/2015   HDL 36 (L) 08/23/2015   CHOLHDL 5.3 (H) 08/23/2015   VLDL 58 (H) 08/23/2015   LDLCALC 95 08/23/2015   LDLCALC 79 11/05/2014   Lab Results  Component Value Date   TSH 3.33 08/23/2015   TSH 1.192 11/04/2014    Therapeutic Level Labs: No results found for: "LITHIUM" No results found for: "VALPROATE" No results found for: "CBMZ"   Screenings: GAD-7    Flowsheet Row [facility] Visit from 04/17/2016 in [facility] Dept Of [place]. [facility] Office Visit from 11/21/2015 in [facility] [place] - A Dept Of Eligha Bridegroom. [facility] Office Visit from 08/28/2015 in [facility] [place] - A Dept Of Eligha Bridegroom. [facility]  Total GAD-7 Score 14 8 13 16       PHQ2-9    Flowsheet Row Video Visit from 01/21/2023 in [facility] Video Visit from 11[facility]21/2022 in [facility] Video Visit from 08[facility]22/2022 in [facility] Video Visit from 06[facility]06/2022 [facility] Video Visit from 01/31/2022 in [facility]  PHQ-2  Total Score 5 5 5 6 6   PHQ-9 Total Score 18 19 20 21 15       Flowsheet Row Video Visit from 01/21/2023 [facility] Video Visit from 11/21/2022 in [facility] from 08[facility]22/2022 [facility]  C-SSRS RISK CATEGORY No Risk No Risk Error: Q3, 4, or 5 should not be populated when Q2 is No       Collaboration of Care: Collaboration of Care: Medication Management AEB medication prescription  Patient/Guardian was advised Release of Information must be obtained prior to any record release in order to collaborate their care with an outside provider. Patient/Guardian was advised if they have not already done so to contact the registration department to sign all necessary forms in order for [place] to release information regarding their care.   Consent: Patient/Guardian gives verbal consent for treatment and assignment of benefits for services provided during this visit. Patient/Guardian expressed understanding and agreed to proceed.    Stasia Cavalier, MD 05/15/2023, 5:11 PM   Virtual Visit via Video Note  I connected with Abigail Sharp on 05/15/23 at  4:30 PM EST by a video enabled telemedicine application and verified that I am speaking with the correct person using two identifiers.  Location: Patient: Home Provider: Home Office   I discussed the limitations of evaluation and management by telemedicine and the availability of in person appointments. The patient expressed understanding and agreed to proceed.   I discussed the assessment and treatment plan with the patient. The patient was provided an opportunity to ask questions and all were answered. The patient agreed with the plan and demonstrated an understanding of the instructions.   The patient was advised to call back or seek an in-person evaluation if the symptoms worsen or if  the condition fails to improve as anticipated.  40 minutes were spent in chart review, interview, psycho education, counseling, medical decision making, coordination of care and long-term prognosis.  Patient was given opportunity to ask question and all concerns and questions were addressed and answers. Excluding separately billable services.  Stasia Cavalier, MD

## 2023-06-04 DIAGNOSIS — Z419 Encounter for procedure for purposes other than remedying health state, unspecified: Secondary | ICD-10-CM | POA: Diagnosis not present

## 2023-06-19 ENCOUNTER — Ambulatory Visit (INDEPENDENT_AMBULATORY_CARE_PROVIDER_SITE_OTHER): Payer: Medicaid Other | Admitting: Licensed Clinical Social Worker

## 2023-06-19 DIAGNOSIS — F333 Major depressive disorder, recurrent, severe with psychotic symptoms: Secondary | ICD-10-CM

## 2023-06-19 DIAGNOSIS — F422 Mixed obsessional thoughts and acts: Secondary | ICD-10-CM | POA: Diagnosis not present

## 2023-06-19 DIAGNOSIS — F4000 Agoraphobia, unspecified: Secondary | ICD-10-CM | POA: Diagnosis not present

## 2023-06-19 DIAGNOSIS — F411 Generalized anxiety disorder: Secondary | ICD-10-CM

## 2023-06-19 DIAGNOSIS — F4310 Post-traumatic stress disorder, unspecified: Secondary | ICD-10-CM | POA: Diagnosis not present
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
## 2023-06-30 ENCOUNTER — Other Ambulatory Visit (HOSPITAL_COMMUNITY): Payer: Self-pay | Admitting: Psychiatry

## 2023-06-30 DIAGNOSIS — F411 Generalized anxiety disorder: Secondary | ICD-10-CM

## 2023-06-30 DIAGNOSIS — F332 Major depressive disorder, recurrent severe without psychotic features: Secondary | ICD-10-CM

## 2023-06-30 DIAGNOSIS — F422 Mixed obsessional thoughts and acts: Secondary | ICD-10-CM

## 2023-07-05 DIAGNOSIS — Z419 Encounter for procedure for purposes other than remedying health state, unspecified: Secondary | ICD-10-CM | POA: Diagnosis not present

## 2023-07-14 NOTE — Progress Notes (Signed)
 BH MD/PA/NP OP Progress Note  07/17/2023 2:32 PM Abigail Sharp  MRN:  [id number]  Visit Diagnosis:    ICD-10-CM   1. GAD (generalized anxiety disorder)  F41.1 sertraline (ZOLOFT) 100 MG tablet    busPIRone (BUSPAR) 10 MG tablet    2. Agoraphobia  F40.00 sertraline (ZOLOFT) 100 MG tablet    busPIRone (BUSPAR) 10 MG tablet    3. Mixed obsessional thoughts and acts  F42.2 sertraline (ZOLOFT) 100 MG tablet    4. Severe episode of recurrent major depressive disorder, without psychotic features (HCC)  F33.2 sertraline (ZOLOFT) 100 MG tablet    lamoTRIgine (LAMICTAL) 25 MG tablet    busPIRone (BUSPAR) 10 MG tablet    5. Encounter for long-term (current) use of medications  Z79.899 CBC with Differential    Thyroid Panel With TSH    Vitamin D (25 hydroxy)    CMP14+EGFR    Lipid Profile    HgB A1c     Assessment: Abigail Sharp is a 58 y.o. female with a history of GAD, MDD, OCD, agoraphobia, and CHF who presented to [facility] at [facility] for initial evaluation on 01/21/2023 following transition from Dr. Michae Kava.  At initial evaluation patient reported significant symptoms of anxiety including excessive worry, ruminations, racing thoughts, increased irritability, restlessness, and fears something awful happening.  She can also experience panic like symptoms with body tremors and shortness of breath when triggered by leaving or having thoughts of leaving the house.  Patient endorsed a history of obsessional thoughts primarily around cleanliness and excessive handwashing.  While still present this had improved compared to the past.  With the excessive anxiety leading to isolation patient has in turn had increased depression.  She endorsed low mood, anhedonia, feelings of worthlessness, feelings of hopelessness, disturbed sleep, and poor appetite.  She denies any SI/HI or thoughts of self-harm.  Patient met criteria for MDD, GAD, OCD, and agoraphobia.  Abigail Sharp presents  for follow-up evaluation. Today, 07/17/23, patient reports the she has been feeling more depressed recently following an interpersonal conflict. She has been having some increased insomnia related to this and due to the need to use the bathroom. Sleep hygiene was reviewed. She takes her medication as prescribed but denies any adverse side effects. Will continue on her current regimen and follow up in 2 months. Blood work was ordered.   Psychotherapeutic interventions were used during today's session. From 2:00 PM to 2:30 PM we used empathic listening techniques and provided support. Used supportive interviewing techniques to validate patients feelings. Worked on cognitive re framing techniques and focusing on behavioral activation.  Improvement was evidenced by patient's participation.   Plan: - Continue Zoloft 200 mg QD - Continue Lamictal 25 mg BID - Continue BuSpar 10 mg BID - Continue Abilify 5 mg daily - CBC, CMP, Lipid profile, A1c, TSH, Vitamin D ordered - Restart therapy with Kandee Keen - Crisis resources reviewed - Follow up in 2 months  Chief Complaint:  Chief Complaint  Patient presents with   Follow-up   HPI: Abigail Sharp reports that she has been having difficulty sleeping. Has been off and on recently with her only getting a few hours at a stretch. Typically she can go 4 hours before having to use the bathroom. Around once a week she can fall back asleep after waking up. This has been going on for the past couple months now. This occurred after she had a bad interpersonal experience with one of the guys she was talking with. Since  then she has had less interest in talking with guys online. She is frustrated that a little thing in her communication can blow up to a bigger thing. Provided support around this. Discussed how her isolation can lead to the increased anxiety/depression that she experiences. Discussed behavioral activation techniques and how getting out of the house to do things can make  her sleep better.   Patient reports that she is taking her medications consistently and denies any adverse side effects. We reviewed and the need to update her blood work as her last labs were from 4 years ago. Also explained how there are things that could be checked in her blood that may be contributing to her depressive symptoms.   Past Psychiatric History:  Dx: Depression and Anxiety (agoraphobia) Meds: Celexa, Zoloft, BuSpar, Lamictal, Depakote, gabapentin, trazodone Previous psychiatrist/therapist: Dr. Michae Kava 2017-2024, PCP was prescribing Celexa Hospitalizations: denies SIB: denies Suicide attempts: denies Hx of violent behavior towards others: denies Current access to guns: denies Hx of abuse:  Military Hx: denies Hx of Seizures: denies Hx of TBI: denies   Past Medical History:  Past Medical History:  Diagnosis Date   Anxiety    Chronic systolic heart failure (HCC)    Depression    HTN (hypertension)    Microcytic anemia    NICM (nonischemic cardiomyopathy) (HCC) 11/2014   EF is 25% improved to 55% in 2015.   Pre-diabetes     Past Surgical History:  Procedure Laterality Date   CARDIAC CATHETERIZATION N/A 11/07/2014   Procedure: Right/Left Heart Cath and Coronary Angiography;  Surgeon: Runell Gess, MD;  Location: [facility] INVASIVE CV LAB;  Service: Cardiovascular;  Laterality: N/A;   TONSILLECTOMY     TUBAL LIGATION      Family History:  Family History  Problem Relation Age of Onset   Diabetes Mother    Hypertension Mother    Diabetes Father    Heart disease Father    Hypertension Father    Diabetes Sister    ADD / ADHD Son    Alcohol abuse Neg Hx    Anxiety disorder Neg Hx    Bipolar disorder Neg Hx    Depression Neg Hx    Drug abuse Neg Hx     Social History:  Social History   Socioeconomic History   Marital status: Single    Spouse name: Not on file   Number of children: Not on file   Years of education: Not on file   Highest education level:  Not on file  Occupational History   Not on file  Tobacco Use   Smoking status: Never   Smokeless tobacco: Never  Vaping Use   Vaping status: Never Used  Substance and Sexual Activity   Alcohol use: No    Alcohol/week: 0.0 standard drinks of alcohol   Drug use: No   Sexual activity: Never  Other Topics Concern   Not on file  Social History Narrative   Social Hx:   Current living situation: Lives alone in apt in [place]: [place], [place] by parents.   Raised by parents. Parents let her do whatever she wanted to do while growing up. Both parents now deceased.    Siblings 1 brother and 2 sisters. Pt is number 3   Schooling- HS grad, faced a lot of bullied due to teen pregnancy at 23. Lived with parents while raising her oldest child but for the youngest she moved out temporarily. Her mom took over raising him at  the age of 30 mo.    Employed- last job in 2002. 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 90 tablet 3   busPIRone (BUSPAR) 10 MG tablet Take 1 tablet (10 mg total) by mouth 2 (two) times daily. 180 tablet 0   carvedilol (COREG) 6.25 MG tablet Take 1 tablet (6.25 mg total) by mouth 2 (two) times daily with a meal. 60 tablet 1   lamoTRIgine (LAMICTAL) 25 MG tablet Take 1 tablet (25 mg total) by mouth 2 (two)  times daily. 180 tablet 0   sertraline (ZOLOFT) 100 MG tablet Take 2 tablets (200 mg total) by mouth daily. 180 tablet 0   spironolactone (ALDACTONE) 25 MG tablet Take 0.5 tablets (12.5 mg total) by mouth daily. 15 tablet 1   No current facility-administered medications for this visit.     Psychiatric Specialty Exam: Review of Systems  There were no vitals taken for this visit.There is no height or weight on file to calculate BMI.  General Appearance: Fairly Groomed  Eye Contact:  Fair  Speech:  Normal Rate  Volume:  Normal  Mood:  Anxious and Depressed  Affect:  Congruent  Thought Process:  Coherent  Orientation:  Full (Time, Place, and Person)  Thought Content: Logical and Illogical   Suicidal Thoughts:  No  Homicidal Thoughts:  No  Memory:  Immediate;   Fair  Judgement:  Impaired  Insight:  Lacking  Psychomotor Activity:  Decreased  Concentration:  Concentration: Fair  Recall:  Fair  Fund of Knowledge: Fair  Language: Good  Akathisia:  No    AIMS (if indicated): not done  Assets:  Communication Skills Housing  ADL's:  Intact  Cognition: WNL  Sleep:  Good   Metabolic Disorder Labs: Lab Results  Component Value Date   HGBA1C 5.7 (H) 11/05/2014   MPG 117 11/05/2014   No results found for: "PROLACTIN" Lab Results  Component Value Date   CHOL 189 08/23/2015   TRIG 290 (H) 08/23/2015   HDL 36 (L) 08/23/2015   CHOLHDL 5.3 (H) 08/23/2015   VLDL 58 (H) 08/23/2015   LDLCALC 95 08/23/2015   LDLCALC 79 11/05/2014   Lab Results  Component Value Date   TSH 3.33 08/23/2015   TSH 1.192 11/04/2014    Therapeutic Level Labs: No results found for: "LITHIUM" No results found for: "VALPROATE" No results found for: "CBMZ"   Screenings: GAD-7    Flowsheet Row Counselor from 06/19/2023 in [facility] at [facility] Video Visit from 01/21/2023 in [facility] Office Visit from 04/17/2016 in [facility] A Dept Of [facility]. [facility] Office  Visit from 11/21/2015 in [facility] [place] - A Dept Of Eligha Bridegroom. [facility] Office Visit from 08/28/2015 in [facility] - A Dept Of Eligha Bridegroom. [facility]  Total GAD-7 Score 11 14 8 13 16       PHQ2-9    Flowsheet Row Counselor from 06/19/2023 in [facility] at [facility] Video Visit from 01/21/2023 in [facility] Video Visit from 11[facility]21/2022 in [facility] Video Visit from 08[facility]22/2022 [facility] Video Visit from 06[facility]06/2022 in [facility]  PHQ-2 Total Score 5 5 5 5 6   PHQ-9 Total Score 18 18 19 20 21       Flowsheet Row Counselor from 06/19/2023 in [facility] at [facility] Video Visit from 01/21/2023 in [facility] Video Visit from 11/21/2022 in [facility]  C-SSRS RISK CATEGORY No Risk No Risk No Risk       Collaboration of Care: Collaboration of Care: Medication Management AEB medication prescription and Referral or follow-up with counselor/therapist AEB chart review  Patient/Guardian was advised Release of Information must be obtained prior to any record release in order to collaborate their care with an outside provider. Patient/Guardian was advised if they have not already done so to contact the registration department to sign all necessary forms in order for [place] to release information regarding their care.   Consent: Patient/Guardian gives verbal consent for treatment and assignment of benefits for services provided during this visit. Patient/Guardian expressed understanding and agreed to proceed.    Stasia Cavalier, MD 07/17/2023, 2:32 PM   Virtual Visit via Video Note  I  connected with Abigail Sharp on 07/17/23 at  2:00 PM EST by a video enabled telemedicine application and verified that I am speaking with the correct person using two identifiers.  Location: Patient: Home Provider: Home Office   I discussed the limitations of evaluation and management by telemedicine and the availability of in person appointments. The patient expressed understanding and agreed to proceed.   I discussed the assessment and treatment plan with the patient. The patient was provided an opportunity to ask questions and all were answered. The patient agreed with the plan and demonstrated an understanding of the instructions.   The patient was advised to call back or seek an in-person evaluation if the symptoms worsen or if the condition fails to improve as anticipated.  30 minutes were spent in chart review, interview, psycho education, counseling, medical decision making, coordination of care and long-term prognosis.  Patient was given opportunity to ask question and all concerns and questions were addressed and answers. Excluding separately billable services.  Stasia Cavalier, MD

## 2023-07-17 ENCOUNTER — Encounter (HOSPITAL_COMMUNITY): Payer: Self-pay

## 2023-07-17 ENCOUNTER — Encounter (HOSPITAL_COMMUNITY): Payer: Self-pay | Admitting: Psychiatry

## 2023-07-17 ENCOUNTER — Telehealth (HOSPITAL_COMMUNITY): Payer: Medicaid Other | Admitting: Psychiatry

## 2023-07-17 DIAGNOSIS — Z79899 Other long term (current) drug therapy: Secondary | ICD-10-CM

## 2023-07-17 DIAGNOSIS — F422 Mixed obsessional thoughts and acts: Secondary | ICD-10-CM

## 2023-07-17 DIAGNOSIS — F332 Major depressive disorder, recurrent severe without psychotic features: Secondary | ICD-10-CM

## 2023-07-17 DIAGNOSIS — F411 Generalized anxiety disorder: Secondary | ICD-10-CM

## 2023-07-17 DIAGNOSIS — F4000 Agoraphobia, unspecified: Secondary | ICD-10-CM | POA: Diagnosis not present

## 2023-07-17 MED ORDER — BUSPIRONE HCL 10 MG PO TABS
10.0000 mg | ORAL_TABLET | Freq: Two times a day (BID) | ORAL | 0 refills | Status: DC
Start: 2023-07-17 — End: 2024-03-03

## 2023-07-17 MED ORDER — SERTRALINE HCL 100 MG PO TABS
200.0000 mg | ORAL_TABLET | Freq: Every day | ORAL | 0 refills | Status: DC
Start: 2023-07-17 — End: 2024-03-03

## 2023-07-17 MED ORDER — LAMOTRIGINE 25 MG PO TABS: 25.0000 mg | ORAL_TABLET | Freq: Two times a day (BID) | ORAL | 0 refills | Status: DC

## 2023-08-02 DIAGNOSIS — Z419 Encounter for procedure for purposes other than remedying health state, unspecified: Secondary | ICD-10-CM | POA: Diagnosis not present

## 2023-09-15 NOTE — Progress Notes (Signed)
 BH MD/PA/NP OP Progress Note  09/18/2023 2:24 PM Abigail Sharp  MRN:  [id number]  Visit Diagnosis:    ICD-10-CM   1. GAD (generalized anxiety disorder)  F41.1 ARIPiprazole  (ABILIFY ) 5 MG tablet    2. Agoraphobia  F40.00     3. Mixed obsessional thoughts and acts  F42.2 ARIPiprazole  (ABILIFY ) 5 MG tablet    4. Severe episode of recurrent major depressive disorder, without psychotic features (HCC)  F33.2 ARIPiprazole  (ABILIFY ) 5 MG tablet      Assessment: Abigail Sharp is a 58 y.o. female with a history of GAD, MDD, OCD, agoraphobia, and CHF who presented to [facility] at [facility] for initial evaluation on 01/21/2023 following transition from Dr. Katrine Parody.  At initial evaluation patient reported significant symptoms of anxiety including excessive worry, ruminations, racing thoughts, increased irritability, restlessness, and fears something awful happening.  She can also experience panic like symptoms with body tremors and shortness of breath when triggered by leaving or having thoughts of leaving the house.  Patient endorsed a history of obsessional thoughts primarily around cleanliness and excessive handwashing.  While still present this had improved compared to the past.  With the excessive anxiety leading to isolation patient has in turn had increased depression.  She endorsed low mood, anhedonia, feelings of worthlessness, feelings of hopelessness, disturbed sleep, and poor appetite.  She denies any SI/HI or thoughts of self-harm.  Patient met criteria for MDD, GAD, OCD, and agoraphobia.  Abigail Sharp presents for follow-up evaluation. Today, 09/18/23, patient reports feelings of falling, hearing/seeing things, and increased anxiety in the interim.  Notably patient has some limitations making history of it harder to obtain.  That said it appears that the symptoms she is describing are occurring primarily at night while she is asleep.  Then upon waking up she recalls the  nightmares and at first thinks they are still occurring however upon checking her surroundings does not notice any of the previously identified visual or auditory hallucinations.  Since these are nightmares we discussed starting prazosin to better manage them reviewed the risk and benefits.  Patient however has not had blood pressure checked in 4 years and we would want to have an updated reading prior to starting this medication.  She does have a home blood pressure cuff and reported being able to check it and message to provide her with the updated results.  We also expressed that we would like to see patient in person for her next visit which will also allow us  to update her metabolic labs.  Psychotherapeutic interventions were used during today's session. From 1:05 PM to 1:30 PM. Therapeutic interventions included empathic listening, supportive therapy, cognitive and behavioral therapy, motivational interviewing. Used supportive interviewing techniques to provide emotional validation. Worked on cognitive reframing techniques and recommendations made for behavioral activation.  Improvement was evidenced by patient's participation and identified commitment to therapy goals.   Plan: - Continue Zoloft  200 mg QD - Continue Lamictal  25 mg BID - Continue BuSpar  10 mg BID - Continue Abilify  5 mg daily - Will start Prazosin 1 mg daily, pending patient sending in her blood pressure results - CBC, CMP, Lipid profile, A1c, TSH, Vitamin D  ordered - Restart therapy with Randel Buss - Crisis resources reviewed - Follow up in 2 months  Chief Complaint:  Chief Complaint  Patient presents with   Follow-up   HPI: Abigail Sharp reports that she is feeling shaky everywhere. Recently she started experiencing something that she has never experienced before. While laying in  bed on her stomach she suddenly has gotten the feeling that she was falling and short breath (similar to nightmare). She denies any feelings of dizziness.  Patient had already been laying on the floor when these occur.  She reports that she is seeing people, talking/arguing with people primarily in her sleep. Sometimes she dozes off in her chair and when she is waking up she feels like people are sitting around her on the floor. She believes that she can see faces like her son and niece. But when she looks again she does not see there.  These started the last month. She denies any significant change in her stress levels. Overall she feels that her stress level has in fact decreased due to one of her neighbors no longer being present.  We discussed patient's symptoms and after clarification it appears that they are most consistent with nightmares.  We had discussed potentially starting prazosin however did not have a blood pressure for the patient over the last 4 years.  She does have a blood pressure cuff at home and was asked to check her blood pressure and messages provider.  Presuming the blood pressure is not hypotensive than we will start prazosin 1 mg.  Risk and benefits of this medication were reviewed.  There is some uncertainty if patient is taking medication consistently. She had difficulty providing a clear account of what she is taking when. Based off patients confusion we asked her to come in person for next visit and to bring her pills.  It will also be an opportunity to get the necessary blood draw that she has yet to do.  Past Psychiatric History:  Dx: Depression and Anxiety (agoraphobia) Meds: Celexa , Zoloft , BuSpar , Lamictal , Depakote , gabapentin , trazodone  Previous psychiatrist/therapist: Dr. Katrine Parody 2017-2024, PCP was prescribing Celexa  Hospitalizations: denies SIB: denies Suicide attempts: denies Hx of violent behavior towards others: denies Current access to guns: denies Hx of abuse:  Military Hx: denies Hx of Seizures: denies Hx of TBI: denies   Past Medical History:  Past Medical History:  Diagnosis Date   Anxiety     Chronic systolic heart failure (HCC)    Depression    HTN (hypertension)    Microcytic anemia    NICM (nonischemic cardiomyopathy) (HCC) 11/2014   EF is 25% improved to 55% in 2015.   Pre-diabetes     Past Surgical History:  Procedure Laterality Date   CARDIAC CATHETERIZATION N/A 11/07/2014   Procedure: Right/Left Heart Cath and Coronary Angiography;  Surgeon: Avanell Leigh, MD;  Location: [facility] INVASIVE CV LAB;  Service: Cardiovascular;  Laterality: N/A;   TONSILLECTOMY     TUBAL LIGATION      Family History:  Family History  Problem Relation Age of Onset   Diabetes Mother    Hypertension Mother    Diabetes Father    Heart disease Father    Hypertension Father    Diabetes Sister    ADD / ADHD Son    Alcohol abuse Neg Hx    Anxiety disorder Neg Hx    Bipolar disorder Neg Hx    Depression Neg Hx    Drug abuse Neg Hx     Social History:  Social History   Socioeconomic History   Marital status: Single    Spouse name: Not on file   Number of children: Not on file   Years of education: Not on file   Highest education level: Not on file  Occupational History   Not on file  Tobacco Use   Smoking status: Never   Smokeless tobacco: Never  Vaping Use   Vaping status: Never Used  Substance and Sexual Activity   Alcohol use: No    Alcohol/week: 0.0 standard drinks of alcohol   Drug use: No   Sexual activity: Never  Other Topics Concern   Not on file  Social History Narrative   Social Hx:   Current living situation: Lives alone in apt in [place]: [place], [place] by parents.   Raised by parents. Parents let her do whatever she wanted to do while growing up. Both parents now deceased.    Siblings 1 brother and 2 sisters. Pt is number 3   Schooling- HS grad, faced a lot of bullied due to teen pregnancy at 72. Lived with parents while raising her oldest child but for the youngest she moved out temporarily. Her mom took over raising him at the age of 60 mo.    Employed- last  job in 2002. 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 90 tablet 0   aspirin  81 MG EC tablet Take 1 tablet (81 mg total) by mouth daily. 90 tablet 3   busPIRone  (BUSPAR ) 10 MG tablet Take 1 tablet (10 mg total) by mouth 2 (two) times daily. 180 tablet 0   carvedilol  (COREG ) 6.25 MG tablet Take 1 tablet (6.25 mg total) by mouth 2 (two) times daily with a meal. 60 tablet 1   lamoTRIgine  (LAMICTAL ) 25 MG tablet Take 1 tablet (25 mg total) by mouth 2 (two) times daily. 180 tablet 0   sertraline  (ZOLOFT ) 100 MG tablet Take 2 tablets (200 mg total) by mouth daily. 180 tablet 0   spironolactone  (ALDACTONE ) 25 MG tablet Take 0.5 tablets (12.5 mg total) by mouth daily. 15 tablet 1   No current facility-administered medications for this visit.     Psychiatric Specialty Exam: Review of Systems  There were no vitals taken for this visit.There is no height or weight on file to calculate BMI.  General Appearance: Fairly Groomed  Eye Contact:  Fair  Speech:  Normal Rate  Volume:  Normal  Mood:  Anxious and Depressed  Affect:  Congruent  Thought Process:  Coherent  Orientation:  Full (Time, Place, and Person)  Thought Content: Logical and Illogical   Suicidal Thoughts:  No  Homicidal Thoughts:  No  Memory:  Immediate;   Fair  Judgement:  Impaired  Insight:  Lacking  Psychomotor Activity:   Decreased  Concentration:  Concentration: Fair  Recall:  [employer] of Knowledge: Fair  Language: Good  Akathisia:  No    AIMS (if indicated): not done  Assets:  Communication Skills Housing  ADL's:  Intact  Cognition: WNL  Sleep:  Good   Metabolic  Disorder Labs: Lab Results  Component Value Date   HGBA1C 5.7 (H) 11/05/2014   MPG 117 11/05/2014   No results found for: "PROLACTIN" Lab Results  Component Value Date   CHOL 189 08/23/2015   TRIG 290 (H) 08/23/2015   HDL 36 (L) 08/23/2015   CHOLHDL 5.3 (H) 08/23/2015   VLDL 58 (H) 08/23/2015   LDLCALC 95 08/23/2015   LDLCALC 79 11/05/2014   Lab Results  Component Value Date   TSH 3.33 08/23/2015   TSH 1.192 11/04/2014    Therapeutic Level Labs: No results found for: "LITHIUM" No results found for: "VALPROATE" No results found for: "CBMZ"   Screenings: GAD-7    Flowsheet Row Counselor from 06/19/2023 in [facility] at [facility] Video Visit from 01/21/2023 in [facility] Office Visit from 04/17/2016 in [facility] [place] - A Dept Of [facility] Office Visit from 11/21/2015 in [facility] [place] - A Dept Of [facility]. [facility] Office Visit from 08/28/2015 in [facility] [place] - A Dept Of [facility]  Total GAD-7 Score 11 14 8 13 16       PHQ2-9    Flowsheet Row Counselor from 06/19/2023 in [facility] at [facility] Video Visit from 01/21/2023 in [facility] Video Visit from 11/21/2022 in [facility] Video Visit from 08[facility]22/2022 in [facility] Video Visit from 06/06/2022 in [facility]  PHQ-2 Total Score 5 5 5 5 6   PHQ-9 Total Score 18 18 19 20 21       Flowsheet Row Counselor from  06/19/2023 in [facility] at [facility] Video Visit from 01/21/2023 in [facility] Video Visit from 11/21/2022 in [facility]  C-SSRS RISK CATEGORY No Risk No Risk No Risk       Collaboration of Care: Collaboration of Care: Medication Management AEB medication prescription  Patient/Guardian was advised Release of Information must be obtained prior to any record release in order to collaborate their care with an outside provider. Patient/Guardian was advised if they have not already done so to contact the registration department to sign all necessary forms in order for us  to release information regarding their care.   Consent: Patient/Guardian gives verbal consent for treatment and assignment of benefits for services provided during this visit. Patient/Guardian expressed understanding and agreed to proceed.    Yves Herb, MD 09/18/2023, 2:24 PM   Virtual Visit via Video Note  I connected with Abigail Sharp on 09/18/23 at  1:00 PM EDT by a video enabled telemedicine application and verified that I am speaking with the correct person using two identifiers.  Location: Patient: Home Provider: Home Office   I discussed the limitations of evaluation and management by telemedicine and the availability of in person appointments. The patient expressed understanding and agreed to proceed.   I discussed the assessment and treatment plan with the patient. The patient was provided an opportunity to ask questions and all were answered. The patient agreed with the plan and demonstrated an understanding of the instructions.   The patient was advised to call back or seek an in-person evaluation if the symptoms worsen or if the condition fails to improve as anticipated.  30 minutes were spent in chart review, interview, psycho education, counseling, medical decision making, coordination of care  and long-term prognosis.  Patient was given opportunity to ask question and all concerns and questions were addressed and answers. Excluding separately billable services.  Yves Herb, MD

## 2023-09-18 ENCOUNTER — Telehealth (HOSPITAL_COMMUNITY): Payer: Medicaid Other | Admitting: Psychiatry

## 2023-09-18 ENCOUNTER — Encounter (HOSPITAL_COMMUNITY): Payer: Self-pay | Admitting: Psychiatry

## 2023-09-18 DIAGNOSIS — F332 Major depressive disorder, recurrent severe without psychotic features: Secondary | ICD-10-CM | POA: Diagnosis not present

## 2023-09-18 DIAGNOSIS — F4000 Agoraphobia, unspecified: Secondary | ICD-10-CM | POA: Diagnosis not present

## 2023-09-18 DIAGNOSIS — F422 Mixed obsessional thoughts and acts: Secondary | ICD-10-CM | POA: Diagnosis not present

## 2023-09-18 DIAGNOSIS — F411 Generalized anxiety disorder: Secondary | ICD-10-CM

## 2023-09-18 MED ORDER — ARIPIPRAZOLE 5 MG PO TABS: 5.0000 mg | ORAL_TABLET | Freq: Every day | ORAL | 0 refills | Status: DC

## 2023-10-06 ENCOUNTER — Other Ambulatory Visit (HOSPITAL_COMMUNITY): Payer: Self-pay | Admitting: Psychiatry

## 2023-10-06 NOTE — Progress Notes (Signed)
 Patient had called the office to cancel her appointment for tomorrow reporting she was not able to come in person.  At that time she did provide up blood pressure to front desk staff.  Per patient her blood pressure was 224/123.  It was confirmed that this was a current blood pressure that she had just taken earlier today.  While she denied any symptoms during her previous visit she reported that she was shaky, short of breath, and having a bizarre vestibular feeling.  Based off this patient was instructed to present to the emergency room for further workup.  She was agreeable to this and reported that she would do so.  We will wait until next appointment to start prazosin though there is no concern about hypotension given patient's reported blood pressure.

## 2023-10-06 NOTE — Progress Notes (Deleted)
 BH MD/PA/NP OP Progress Note  10/06/2023 8:26 AM Abigail Sharp  MRN:  [id number]  Visit Diagnosis: No diagnosis found.  Assessment:  Abigail Sharp is a 58 y.o. female with a history of GAD, MDD, OCD, agoraphobia, and CHF who presented to [facility] at [facility] for initial evaluation on 01/21/2023 following transition from Dr. Katrine Parody.  At initial evaluation patient reported significant symptoms of anxiety including excessive worry, ruminations, racing thoughts, increased irritability, restlessness, and fears something awful happening.  She can also experience panic like symptoms with body tremors and shortness of breath when triggered by leaving or having thoughts of leaving the house.  Patient endorsed a history of obsessional thoughts primarily around cleanliness and excessive handwashing.  While still present this had improved compared to the past.  With the excessive anxiety leading to isolation patient has in turn had increased depression.  She endorsed low mood, anhedonia, feelings of worthlessness, feelings of hopelessness, disturbed sleep, and poor appetite.  She denies any SI/HI or thoughts of self-harm.  Patient met criteria for MDD, GAD, OCD, and agoraphobia.  Abigail Sharp presents for follow-up evaluation. Today, 10/06/23, patient reports ***     feelings of falling, hearing/seeing things, and increased anxiety in the interim.  Notably patient has some limitations making history of it harder to obtain.  That said it appears that the symptoms she is describing are occurring primarily at night while she is asleep.  Then upon waking up she recalls the nightmares and at first thinks they are still occurring however upon checking her surroundings does not notice any of the previously identified visual or auditory hallucinations.  Since these are nightmares we discussed starting prazosin to better manage them reviewed the risk and benefits.  Patient however has not had  blood pressure checked in 4 years and we would want to have an updated reading prior to starting this medication.  She does have a home blood pressure cuff and reported being able to check it and message to provide her with the updated results.  We also expressed that we would like to see patient in person for her next visit which will also allow us  to update her metabolic labs.  Psychotherapeutic interventions were used during today's session. From 2:35 PM to 3:00 PM. Therapeutic interventions included empathic listening, supportive therapy, cognitive and behavioral therapy, motivational interviewing. Used supportive interviewing techniques to provide emotional validation. Worked on cognitive reframing techniques and recommendations made for behavioral activation.  Improvement was evidenced by patient's participation and identified commitment to therapy goals.   Risk Assessment: An assessment of suicide and violence risk factors was performed as part of this evaluation and is not *** significantly changed from the last visit. While future psychiatric events cannot be accurately predicted, the patient does not *** currently require acute inpatient psychiatric care and does not *** currently meet [place]  involuntary commitment criteria. Patient was given contact information for crisis resources, behavioral health clinic and was instructed to call 911 for emergencies.    Plan: # GAD/MDD Past medication trials:  Status of problem: Ongoing Interventions: - Continue Zoloft  200 mg QD - Continue Lamictal  25 mg BID - Continue BuSpar  10 mg BID - Continue Abilify  5 mg daily - Will start Prazosin 1 mg daily, pending patient sending in her blood pressure results - CBC, CMP, Lipid profile, A1c, TSH, Vitamin D  ordered  # OCD Past medication trials:  Status of problem: Ongoing Interventions: - Continue Zoloft  200 mg QD - Continue BuSpar  10  mg BID - Continue Abilify  5 mg daily  #  Agoraphobia Past medication trials:  Status of problem: Ongoing Interventions: - Recommend restarting therapy with Randel Buss  Chief Complaint: No chief complaint on file.  HPI: ***   Past Psychiatric History:  Dx: Depression and Anxiety (agoraphobia) Meds: Celexa , Zoloft , BuSpar , Lamictal , Depakote , gabapentin , trazodone  Previous psychiatrist/therapist: Dr. Katrine Parody 2017-2024, PCP was prescribing Celexa  Hospitalizations: denies SIB: denies Suicide attempts: denies Hx of violent behavior towards others: denies Current access to guns: denies Hx of abuse:  Military Hx: denies Hx of Seizures: denies Hx of TBI: denies  Past Medical History:  Past Medical History:  Diagnosis Date   Anxiety    Chronic systolic heart failure (HCC)    Depression    HTN (hypertension)    Microcytic anemia    NICM (nonischemic cardiomyopathy) (HCC) 11/2014   EF is 25% improved to 55% in 2015.   Pre-diabetes     Past Surgical History:  Procedure Laterality Date   CARDIAC CATHETERIZATION N/A 11/07/2014   Procedure: Right/Left Heart Cath and Coronary Angiography;  Surgeon: Avanell Leigh, MD;  Location: [facility] INVASIVE CV LAB;  Service: Cardiovascular;  Laterality: N/A;   TONSILLECTOMY     TUBAL LIGATION      Family Psychiatric History: ***  Family History:  Family History  Problem Relation Age of Onset   Diabetes Mother    Hypertension Mother    Diabetes Father    Heart disease Father    Hypertension Father    Diabetes Sister    ADD / ADHD Son    Alcohol abuse Neg Hx    Anxiety disorder Neg Hx    Bipolar disorder Neg Hx    Depression Neg Hx    Drug abuse Neg Hx     Social History:  Social History   Socioeconomic History   Marital status: Single    Spouse name: Not on file   Number of children: Not on file   Years of education: Not on file   Highest education level: Not on file  Occupational History   Not on file  Tobacco Use   Smoking status: Never   Smokeless tobacco: Never   Vaping Use   Vaping status: Never Used  Substance and Sexual Activity   Alcohol use: No    Alcohol/week: 0.0 standard drinks of alcohol   Drug use: No   Sexual activity: Never  Other Topics Concern   Not on file  Social History Narrative   Social Hx:   Current living situation: Lives alone in apt in [place]: [place], [place] by parents.   Raised by parents. Parents let her do whatever she wanted to do while growing up. Both parents now deceased.    Siblings 1 brother and 2 sisters. Pt is number 3   Schooling- HS grad, faced a lot of bullied due to teen pregnancy at 53. Lived with parents while raising her oldest child but for the youngest she moved out temporarily. Her mom took over raising him at the age of 70 mo.    Employed- last job in 2002. 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 90 tablet 0   aspirin  81 MG EC tablet Take 1 tablet (81 mg total) by mouth daily. 90 tablet 3   busPIRone  (BUSPAR ) 10 MG tablet Take 1 tablet (10 mg total) by mouth 2 (two) times daily. 180 tablet 0   carvedilol  (COREG ) 6.25 MG tablet Take 1 tablet (6.25 mg total) by mouth 2 (two) times daily with a meal. 60 tablet 1   lamoTRIgine  (LAMICTAL ) 25 MG tablet Take 1 tablet (25 mg total) by mouth 2 (two) times daily. 180 tablet 0   sertraline  (ZOLOFT ) 100 MG tablet Take 2 tablets (200 mg total) by mouth daily.  180 tablet 0   spironolactone  (ALDACTONE ) 25 MG tablet Take 0.5 tablets (12.5 mg total) by mouth daily. 15 tablet 1   No current facility-administered medications for this visit.     Musculoskeletal: Strength & Muscle Tone: {desc; muscle tone:32375} Gait & Station: {PE GAIT ED UJWJ:19147} Patient leans: {Patient Leans:21022755}  Psychiatric Specialty Exam: There were no vitals taken for this visit.There is no height or weight on file to calculate BMI. Review of Systems  General Appearance: {Appearance:22683}  Eye Contact:  {BHH EYE CONTACT:22684}  Speech:  {Speech:22685}  Volume:  {Volume (PAA):22686}  Mood:  {BHH MOOD:22306}  Affect:  {Affect (PAA):22687}  Thought Content: {Thought Content:22690}   Suicidal Thoughts:  {ST/HT (PAA):22692}  Homicidal Thoughts:  {ST/HT (PAA):22692}  Thought Process:  {Thought Process (PAA):22688}  Orientation:  {BHH ORIENTATION (PAA):22689}    Memory: {BHH MEMORY:22881}  Judgment:  {Judgement (PAA):22694}  Insight:  {Insight (PAA):22695}  Concentration:  {Concentration:21399}  Recall:  not formally assessed ***  Fund of Knowledge: {BHH GOOD/FAIR/POOR:22877}  Language: {BHH GOOD/FAIR/POOR:22877}  Psychomotor Activity:  {Psychomotor (PAA):22696}  Akathisia:  {BHH YES OR NO:22294}  AIMS (if indicated): {Desc; done/not:10129}  Assets:  {Assets (PAA):22698}  ADL's:  {BHH WGN'F:62130}  Cognition: {chl bhh cognition:304700322}  Sleep:  {BHH GOOD/FAIR/POOR:22877}   Metabolic Disorder Labs: Lab Results  Component Value Date   HGBA1C 5.7 (H) 11/05/2014   MPG 117 11/05/2014   No results found for: "PROLACTIN" Lab Results  Component Value Date   CHOL 189 08/23/2015   TRIG 290 (H) 08/23/2015   HDL 36 (L) 08/23/2015   CHOLHDL 5.3 (H) 08/23/2015   VLDL 58 (H) 08/23/2015   LDLCALC 95 08/23/2015   LDLCALC 79 11/05/2014   Lab Results  Component Value Date   TSH 3.33 08/23/2015   TSH 1.192 11/04/2014    Therapeutic Level Labs: No results  found for: "LITHIUM" No results found for: "VALPROATE" No results found for: "CBMZ"   Screenings: GAD-7    Flowsheet Row Counselor from 06/19/2023 in [facility] at [facility] Video Visit from 01/21/2023 in [facility] Office Visit from 04/17/2016 in [facility] [place] - A Dept Of [place] Office Visit from 11/21/2015 in [facility] [place] - A Dept Of Tommas Fragmin. [facility] Office Visit from 08/28/2015 in [facility] [place] - A Dept Of Tommas Fragmin. [facility]  Total GAD-7 Score 11 14 8 13  16  PHQ2-9    Flowsheet Row Counselor from 06/19/2023 in [facility] at [facility] Video Visit from 01/21/2023 in [facility] Video Visit from 11/21/2022 in [facility] Video Visit from 08/22/2022 in [facility] Video Visit from 06/06/2022 in [facility]  PHQ-2 Total Score 5 5 5 5 6   PHQ-9 Total Score 18 18 19 20 21       Flowsheet Row Counselor from 06/19/2023 in [facility] at [facility] Video Visit from 01/21/2023 in [facility] Video Visit from 11/21/2022 in [facility]  C-SSRS RISK CATEGORY No Risk No Risk No Risk       Collaboration of Care: Collaboration of Care: {BH OP Collaboration of WUJW:11914782}  Patient/Guardian was advised Release of Information must be obtained prior to any record release in order to collaborate their care with an outside provider. Patient/Guardian was advised if they have not already done so to contact the registration department to sign all necessary forms in order for us  to release information regarding their care.   Consent:  Patient/Guardian gives verbal consent for treatment and assignment of benefits for services provided during this visit. Patient/Guardian expressed understanding and agreed to proceed.    Yves Herb, MD 10/06/2023, 8:26 AM

## 2023-10-07 ENCOUNTER — Ambulatory Visit (HOSPITAL_COMMUNITY): Admitting: Psychiatry

## 2023-11-18 ENCOUNTER — Ambulatory Visit (HOSPITAL_COMMUNITY): Admitting: Psychiatry

## 2023-12-08 ENCOUNTER — Ambulatory Visit (HOSPITAL_COMMUNITY): Admitting: Psychiatry

## 2023-12-08 ENCOUNTER — Telehealth (HOSPITAL_COMMUNITY): Payer: Self-pay | Admitting: Psychiatry

## 2023-12-08 ENCOUNTER — Encounter (HOSPITAL_COMMUNITY): Payer: Self-pay | Admitting: Psychiatry

## 2023-12-08 NOTE — Progress Notes (Deleted)
 BH MD/PA/NP OP Progress Note  12/08/2023 8:30 AM Katharina Jehle  MRN:  [id number]  Visit Diagnosis: No diagnosis found.  Assessment: Abigail Sharp is a 58 y.o. female with a history of GAD, MDD, OCD, agoraphobia, and CHF who presented to [facility] at [facility] for initial evaluation on 01/21/2023 following transition from Dr. Brutus.  At initial evaluation patient reported significant symptoms of anxiety including excessive worry, ruminations, racing thoughts, increased irritability, restlessness, and fears something awful happening.  She can also experience panic like symptoms with body tremors and shortness of breath when triggered by leaving or having thoughts of leaving the house.  Patient endorsed a history of obsessional thoughts primarily around cleanliness and excessive handwashing.  While still present this had improved compared to the past.  With the excessive anxiety leading to isolation patient has in turn had increased depression.  She endorsed low mood, anhedonia, feelings of worthlessness, feelings of hopelessness, disturbed sleep, and poor appetite.  She denies any SI/HI or thoughts of self-harm.  Patient met criteria for MDD, GAD, OCD, and agoraphobia.  Latania Bascomb presents for follow-up evaluation. Today, 12/08/23, patient reports ***    feelings of falling, hearing/seeing things, and increased anxiety in the interim.  Notably patient has some limitations making history of it harder to obtain.  That said it appears that the symptoms she is describing are occurring primarily at night while she is asleep.  Then upon waking up she recalls the nightmares and at first thinks they are still occurring however upon checking her surroundings does not notice any of the previously identified visual or auditory hallucinations.  Since these are nightmares we discussed starting prazosin to better manage them reviewed the risk and benefits.  Patient however has not had blood  pressure checked in 4 years and we would want to have an updated reading prior to starting this medication.  She does have a home blood pressure cuff and reported being able to check it and message to provide her with the updated results.  We also expressed that we would like to see patient in person for her next visit which will also allow us  to update her metabolic labs.  Psychotherapeutic interventions were used during today's session. From 1:35 PM to 1:58 PM. Therapeutic interventions included empathic listening, supportive therapy, cognitive and behavioral therapy, motivational interviewing. Used supportive interviewing techniques to provide emotional validation. Worked on cognitive reframing techniques and recommendations made for behavioral activation.  Improvement was evidenced by patient's participation and identified commitment to therapy goals.    Risk Assessment: An assessment of suicide and violence risk factors was performed as part of this evaluation and is not significantly changed from the last visit. While future psychiatric events cannot be accurately predicted, the patient does not currently require acute inpatient psychiatric care and does not currently meet [place]  involuntary commitment criteria. Patient was given contact information for crisis resources, behavioral health clinic and was instructed to call 911 for emergencies.   Plan: - Continue Zoloft  200 mg QD - Continue Lamictal  25 mg BID - Continue BuSpar  10 mg BID - Continue Abilify  5 mg daily - Will start Prazosin 1 mg daily, pending patient sending in her blood pressure results - CBC, CMP, Lipid profile, A1c, TSH, Vitamin D  ordered - Restart therapy with Darleene - Crisis resources reviewed - Follow up in 2 months   Chief Complaint: No chief complaint on file.  HPI: ***   Past Psychiatric History:  Dx: Depression and Anxiety (agoraphobia)  Meds: Celexa , Zoloft , BuSpar , Lamictal , Depakote , gabapentin ,  trazodone  Previous psychiatrist/therapist: Dr. Brutus 2017-2024, PCP was prescribing Celexa  Hospitalizations: denies SIB: denies Suicide attempts: denies Hx of violent behavior towards others: denies Current access to guns: denies Hx of abuse:  Military Hx: denies Hx of Seizures: denies Hx of TBI: denies   Past Medical History:  Past Medical History:  Diagnosis Date   Anxiety    Chronic systolic heart failure (HCC)    Depression    HTN (hypertension)    Microcytic anemia    NICM (nonischemic cardiomyopathy) (HCC) 11/2014   EF is 25% improved to 55% in 2015.   Pre-diabetes     Past Surgical History:  Procedure Laterality Date   CARDIAC CATHETERIZATION N/A 11/07/2014   Procedure: Right/Left Heart Cath and Coronary Angiography;  Surgeon: Dorn JINNY Lesches, MD;  Location: [facility] INVASIVE CV LAB;  Service: Cardiovascular;  Laterality: N/A;   TONSILLECTOMY     TUBAL LIGATION      Family History:  Family History  Problem Relation Age of Onset   Diabetes Mother    Hypertension Mother    Diabetes Father    Heart disease Father    Hypertension Father    Diabetes Sister    ADD / ADHD Son    Alcohol abuse Neg Hx    Anxiety disorder Neg Hx    Bipolar disorder Neg Hx    Depression Neg Hx    Drug abuse Neg Hx     Social History:  Social History   Socioeconomic History   Marital status: Single    Spouse name: Not on file   Number of children: Not on file   Years of education: Not on file   Highest education level: Not on file  Occupational History   Not on file  Tobacco Use   Smoking status: Never   Smokeless tobacco: Never  Vaping Use   Vaping status: Never Used  Substance and Sexual Activity   Alcohol use: No    Alcohol/week: 0.0 standard drinks of alcohol   Drug use: No   Sexual activity: Never  Other Topics Concern   Not on file  Social History Narrative   Social Hx:   Current living situation: Lives alone in apt in [place]: [place], [place] by parents.    Raised by parents. Parents let her do whatever she wanted to do while growing up. Both parents now deceased.    Siblings 1 brother and 2 sisters. Pt is number 3   Schooling- HS grad, faced a lot of bullied due to teen pregnancy at 25. Lived with parents while raising her oldest child but for the youngest she moved out temporarily. Her mom took over raising him at the age of 68 mo.    Employed- last job in 2002. 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 90 tablet 0   aspirin  81 MG EC tablet Take 1 tablet (81 mg total) by mouth daily. 90 tablet 3   busPIRone  (BUSPAR ) 10 MG tablet Take 1 tablet (10 mg total) by mouth 2 (two) times daily. 180 tablet 0   carvedilol  (COREG ) 6.25 MG tablet Take 1 tablet (6.25 mg total) by mouth 2 (two) times daily with a meal. 60 tablet 1   lamoTRIgine  (LAMICTAL ) 25 MG tablet Take 1 tablet (25 mg total) by mouth 2 (two) times daily. 180 tablet 0   sertraline  (ZOLOFT ) 100 MG tablet Take 2 tablets (200 mg total) by mouth daily. 180 tablet 0   spironolactone  (ALDACTONE ) 25 MG tablet Take 0.5 tablets (12.5 mg total) by mouth daily. 15 tablet 1   No current facility-administered medications for this visit.     Musculoskeletal: Strength & Muscle Tone: {desc; muscle tone:32375} Gait & Station: {PE GAIT ED WJUO:77474} Patient leans: {Patient Leans:21022755}  Psychiatric  Specialty Exam: There were no vitals taken for this visit.There is no height or weight on file to calculate BMI. Review of Systems  General Appearance: {Appearance:22683}  Eye Contact:  {BHH EYE CONTACT:22684}  Speech:  {Speech:22685}  Volume:  {Volume (PAA):22686}  Mood:  {BHH MOOD:22306}  Affect:  {Affect (PAA):22687}  Thought Content: {Thought Content:22690}   Suicidal Thoughts:  {ST/HT (PAA):22692}  Homicidal Thoughts:  {ST/HT (PAA):22692}  Thought Process:  {Thought Process (PAA):22688}  Orientation:  {BHH ORIENTATION (PAA):22689}    Memory: {BHH MEMORY:22881}  Judgment:  {Judgement (PAA):22694}  Insight:  {Insight (PAA):22695}  Concentration:  {Concentration:21399}  Recall:  not formally assessed ***  Fund of Knowledge: {BHH GOOD/FAIR/POOR:22877}  Language: {BHH GOOD/FAIR/POOR:22877}  Psychomotor Activity:  {Psychomotor (PAA):22696}  Akathisia:  {BHH YES OR NO:22294}  AIMS (if indicated): {Desc; done/not:10129}  Assets:  {Assets (PAA):22698}  ADL's:  {BHH JIO'D:77709}  Cognition: {chl bhh cognition:304700322}  Sleep:  {BHH GOOD/FAIR/POOR:22877}   Metabolic Disorder Labs: Lab Results  Component Value Date   HGBA1C 5.7 (H) 11/05/2014   MPG 117 11/05/2014   No results found for: PROLACTIN Lab Results  Component Value Date   CHOL 189 08/23/2015   TRIG 290 (H) 08/23/2015   HDL 36 (L) 08/23/2015   CHOLHDL 5.3 (H) 08/23/2015   VLDL 58 (H) 08/23/2015   LDLCALC 95 08/23/2015   LDLCALC 79 11/05/2014   Lab Results  Component Value Date   TSH 3.33 08/23/2015   TSH 1.192 11/04/2014    Therapeutic Level Labs: No results found for: LITHIUM No results found for: VALPROATE No results found for: CBMZ   Screenings: GAD-7    Flowsheet Row Counselor from 06/19/2023 in [facility] at [facility] Video Visit from 01/21/2023 in [facility] Office Visit from 04/17/2016 in [facility]  [place] - A Dept Of [place]. [facility] Office Visit from 11/21/2015 in [facility] [place] - A Dept Of Jolynn DEL. [facility] Office Visit from 08/28/2015 in [facility] [place] - A Dept Of Jolynn DEL. [facility]  Total GAD-7 Score 11 14 8 13 16    PHQ2-9    Flowsheet Row Counselor from 06/19/2023 in [facility] at [facility] Video Visit from 01/21/2023 in [facility] Video Visit from 11/21/2022 in [facility] Video Visit from 08/22/2022 in [facility] Video Visit from 06/06/2022  in [facility]  PHQ-2 Total Score 5 5 5 5 6   PHQ-9 Total Score 18 18 19 20 21    Flowsheet Row Counselor from 06/19/2023 in [facility] at [facility] Video Visit from 01/21/2023 in [facility] Video Visit from 11/21/2022 in [facility]  C-SSRS RISK CATEGORY No Risk No Risk No Risk    Collaboration of Care: Collaboration of Care: Medication Management AEB ***  Patient/Guardian was advised Release of Information must be obtained prior to any record release in order to collaborate their care with an outside provider. Patient/Guardian was advised if they have not already done so to contact the registration department to sign all necessary forms in order for us  to release information regarding their care.   Consent: Patient/Guardian gives verbal consent for treatment and assignment of benefits for services provided during this visit. Patient/Guardian expressed understanding and agreed to proceed.    Arvella CHRISTELLA Finder, MD 12/08/2023, 8:30 AM

## 2023-12-08 NOTE — Telephone Encounter (Signed)
 10:14am 12/08/23 The patient has been discharged from the practice per her request and also due non-compliance not coming into the office for visits with provider.SABRAlex

## 2023-12-09 ENCOUNTER — Telehealth (HOSPITAL_COMMUNITY): Admitting: Psychiatry

## 2023-12-22 ENCOUNTER — Ambulatory Visit: Payer: Self-pay

## 2023-12-22 NOTE — Telephone Encounter (Signed)
 FYI Only or Action Required?: FYI only for provider.  Patient was last seen in primary care on NA.  Called Nurse Triage reporting Shortness of Breath.  Symptoms began several months ago.  Interventions attempted: Nothing.  Symptoms are: gradually worsening.  Triage Disposition: No disposition on file.  Patient/caregiver understands and will follow disposition?:   To ER           Copied from CRM [id number]. Topic: Clinical - Red Word Triage >> Dec 22, 2023  3:40 PM Yolanda T wrote: Red Word that prompted transfer to Nurse Triage: shortness of breath, bad cough, dizziness to the point she can't stand up due to being out of breath. Patient stated this has been going on for months Reason for Disposition  [1] MODERATE difficulty breathing (e.g., speaks in phrases, SOB even at rest, pulse 100-120) AND [2] NEW-onset or WORSE than normal  Answer Assessment - Initial Assessment Questions 1. RESPIRATORY STATUS: Describe your breathing? (e.g., wheezing, shortness of breath, unable to speak, severe coughing)      Sob, cough, dizziness 2. ONSET: When did this breathing problem begin?      Months ago 3. PATTERN Does the difficult breathing come and go, or has it been constant since it started?      When she's standing up and moving around 4. SEVERITY: How bad is your breathing? (e.g., mild, moderate, severe)      severe 5. RECURRENT SYMPTOM: Have you had difficulty breathing before? If Yes, ask: When was the last time? and What happened that time?      Yes, CHF 6. CARDIAC HISTORY: Do you have any history of heart disease? (e.g., heart attack, angina, bypass surgery, angioplasty)      CHF,  7. LUNG HISTORY: Do you have any history of lung disease?  (e.g., pulmonary embolus, asthma, emphysema)     denies 8. CAUSE: What do you think is causing the breathing problem?      unknown 9. OTHER SYMPTOMS: Do you have any other symptoms? (e.g., chest pain, cough, dizziness,  fever, runny nose)     Cough, dizziness, runny nose. 10. O2 SATURATION MONITOR:  Do you use an oxygen saturation monitor (pulse oximeter) at home? If Yes, ask: What is your reading (oxygen level) today? What is your usual oxygen saturation reading? (e.g., 95%)       na 11. PREGNANCY: Is there any chance you are pregnant? When was your last menstrual period?       na 12. TRAVEL: Have you traveled out of the country in the last month? (e.g., travel history, exposures)       no  Protocols used: Breathing Difficulty-A-AH

## 2024-01-02 ENCOUNTER — Encounter: Payer: Self-pay | Admitting: Cardiology

## 2024-01-02 ENCOUNTER — Emergency Department (HOSPITAL_COMMUNITY)
Admission: EM | Admit: 2024-01-02 | Discharge: 2024-01-03 | Disposition: A | Discharge status: Home / Self Care | Payer: MEDICAID | Attending: Emergency Medicine | Admitting: Emergency Medicine

## 2024-01-02 ENCOUNTER — Other Ambulatory Visit (HOSPITAL_COMMUNITY): Payer: Self-pay

## 2024-01-02 ENCOUNTER — Other Ambulatory Visit: Payer: Self-pay

## 2024-01-02 ENCOUNTER — Emergency Department (HOSPITAL_COMMUNITY): Payer: MEDICAID

## 2024-01-02 ENCOUNTER — Ambulatory Visit: Payer: MEDICAID | Admitting: Cardiology

## 2024-01-02 ENCOUNTER — Encounter (HOSPITAL_COMMUNITY): Payer: Self-pay

## 2024-01-02 VITALS — BP 122/79 | HR 93 | Resp 16 | Ht 67.0 in | Wt 277.0 lb

## 2024-01-02 DIAGNOSIS — Z6841 Body Mass Index (BMI) 40.0 and over, adult: Secondary | ICD-10-CM | POA: Insufficient documentation

## 2024-01-02 DIAGNOSIS — I509 Heart failure, unspecified: Secondary | ICD-10-CM | POA: Insufficient documentation

## 2024-01-02 DIAGNOSIS — R0602 Shortness of breath: Secondary | ICD-10-CM

## 2024-01-02 DIAGNOSIS — I5032 Chronic diastolic (congestive) heart failure: Secondary | ICD-10-CM | POA: Insufficient documentation

## 2024-01-02 DIAGNOSIS — I10 Essential (primary) hypertension: Secondary | ICD-10-CM | POA: Diagnosis not present

## 2024-01-02 DIAGNOSIS — I110 Hypertensive heart disease with heart failure: Secondary | ICD-10-CM | POA: Diagnosis not present

## 2024-01-02 DIAGNOSIS — Z79899 Other long term (current) drug therapy: Secondary | ICD-10-CM | POA: Diagnosis not present

## 2024-01-02 DIAGNOSIS — Z7982 Long term (current) use of aspirin: Secondary | ICD-10-CM | POA: Insufficient documentation

## 2024-01-02 DIAGNOSIS — N3000 Acute cystitis without hematuria: Secondary | ICD-10-CM | POA: Diagnosis not present

## 2024-01-02 DIAGNOSIS — F411 Generalized anxiety disorder: Secondary | ICD-10-CM | POA: Diagnosis not present

## 2024-01-02 DIAGNOSIS — E66813 Obesity, class 3: Secondary | ICD-10-CM | POA: Insufficient documentation

## 2024-01-02 LAB — CBC
HCT: 45.1 % (ref 36.0–46.0)
Hemoglobin: 14 g/dL (ref 12.0–15.0)
MCH: 23.6 pg — ABNORMAL LOW (ref 26.0–34.0)
MCHC: 31 g/dL (ref 30.0–36.0)
MCV: 76.2 fL — ABNORMAL LOW (ref 80.0–100.0)
Platelets: 218 K/uL (ref 150–400)
RBC: 5.92 MIL/uL — ABNORMAL HIGH (ref 3.87–5.11)
RDW: 18.7 % — ABNORMAL HIGH (ref 11.5–15.5)
WBC: 7.5 K/uL (ref 4.0–10.5)
nRBC: 0 % (ref 0.0–0.2)

## 2024-01-02 LAB — BASIC METABOLIC PANEL WITH GFR
Anion gap: 10 (ref 5–15)
BUN: 39 mg/dL — ABNORMAL HIGH (ref 6–20)
CO2: 21 mmol/L — ABNORMAL LOW (ref 22–32)
Calcium: 9.2 mg/dL (ref 8.9–10.3)
Chloride: 108 mmol/L (ref 98–111)
Creatinine, Ser: 2.18 mg/dL — ABNORMAL HIGH (ref 0.44–1.00)
GFR, Estimated: 26 mL/min — ABNORMAL LOW (ref >60)
Glucose, Bld: 93 mg/dL (ref 70–99)
Potassium: 4.8 mmol/L (ref 3.5–5.1)
Sodium: 139 mmol/L (ref 135–145)

## 2024-01-02 LAB — I-STAT VENOUS BLOOD GAS, ED
Acid-base deficit: 3 mmol/L — ABNORMAL HIGH (ref 0.0–2.0)
Bicarbonate: 23.5 mmol/L (ref 20.0–28.0)
Calcium, Ion: 1.18 mmol/L (ref 1.15–1.40)
HCT: 41 % (ref 36.0–46.0)
Hemoglobin: 13.9 g/dL (ref 12.0–15.0)
O2 Saturation: 100 %
Potassium: 4.6 mmol/L (ref 3.5–5.1)
Sodium: 142 mmol/L (ref 135–145)
TCO2: 25 mmol/L (ref 22–32)
pCO2, Ven: 45.9 mmHg (ref 44–60)
pH, Ven: 7.317 (ref 7.25–7.43)
pO2, Ven: 198 mmHg — ABNORMAL HIGH (ref 32–45)

## 2024-01-02 LAB — D-DIMER, QUANTITATIVE: D-Dimer, Quant: 0.28 ug{FEU}/mL (ref 0.00–0.50)

## 2024-01-02 LAB — RESP PANEL BY RT-PCR (RSV, FLU A&B, COVID)  RVPGX2
Influenza A by PCR: NEGATIVE
Influenza B by PCR: NEGATIVE
Resp Syncytial Virus by PCR: NEGATIVE
SARS Coronavirus 2 by RT PCR: NEGATIVE

## 2024-01-02 LAB — BRAIN NATRIURETIC PEPTIDE: B Natriuretic Peptide: 119.1 pg/mL — ABNORMAL HIGH (ref 0.0–100.0)

## 2024-01-02 LAB — TROPONIN I (HIGH SENSITIVITY)
Troponin I (High Sensitivity): 20 ng/L — ABNORMAL HIGH (ref <18)
Troponin I (High Sensitivity): 17 ng/L (ref <18)

## 2024-01-02 MED ORDER — CARVEDILOL 6.25 MG PO TABS
6.2500 mg | ORAL_TABLET | Freq: Two times a day (BID) | ORAL | 3 refills | Status: DC
  Filled 2024-01-02: qty 60, 30d supply, fill #0

## 2024-01-02 MED ORDER — SPIRONOLACTONE 25 MG PO TABS
25.0000 mg | ORAL_TABLET | Freq: Every day | ORAL | 3 refills | Status: DC
  Filled 2024-01-02: qty 30, 30d supply, fill #0

## 2024-01-02 NOTE — ED Provider Notes (Signed)
 [place] [facility] Provider Note   CSN: [id number] Arrival date & time: 01/02/24  1254     History  Chief Complaint  Patient presents with   Shortness of Breath    Abigail Sharp is a 58 y.o. female with PMH as listed below who presents with shortness of breath.  This has been ongoing for weeks and is worse with exertion. Has to rest and lay down to improve her SOB. Denies orthopnea or lower extremity swelling. Endorses deep dry cough and nausea with no vomiting. Denies fevers/chills. She does have history of CHF, has been out of her medications for at least 3 years.  No chest pain or lower extremity edema but reports that she urinates all the time.  She was previously on carvedilol , spironolactone , aspirin  but has not taken these.  She is on medication for depression/anxiety but otherwise does not take medication daily. Endorses dizziness and falls at home. [place] one time in the last couple of weeks but didn't hit her head or lose consciousness. Isn't sure what is making her fall. Endorses feeling weak all over. Feels worse than when she got diagnosed w/ heart failure several years ago. No h/o DVT/PE, recent travel/hospitalizations/surgeries.  Also endorses acid reflux.    Past Medical History:  Diagnosis Date   Anxiety    Chronic systolic heart failure (HCC)    Depression    HTN (hypertension)    Microcytic anemia    NICM (nonischemic cardiomyopathy) (HCC) 11/2014   EF is 25% improved to 55% in 2015.   Pre-diabetes        Home Medications Prior to Admission medications   Medication Sig Start Date End Date Taking? Authorizing Provider  ARIPiprazole  (ABILIFY ) 5 MG tablet Take 1 tablet (5 mg total) by mouth daily. 09/18/23   Carvin Arvella HERO, MD  aspirin  81 MG EC tablet Take 1 tablet (81 mg total) by mouth daily. 04/17/16   Marilyne Stephane DASEN, MD  busPIRone  (BUSPAR ) 10 MG tablet Take 1 tablet (10 mg total) by mouth 2 (two) times daily. 07/17/23    Carvin Arvella HERO, MD  carvedilol  (COREG ) 6.25 MG tablet Take 1 tablet (6.25 mg total) by mouth 2 (two) times daily with a meal. 01/02/24   Tolia, Sunit, DO  lamoTRIgine  (LAMICTAL ) 25 MG tablet Take 1 tablet (25 mg total) by mouth 2 (two) times daily. 07/17/23 01/02/24  Carvin Arvella HERO, MD  sertraline  (ZOLOFT ) 100 MG tablet Take 2 tablets (200 mg total) by mouth daily. 07/17/23 07/16/24  Carvin Arvella HERO, MD  spironolactone  (ALDACTONE ) 25 MG tablet Take 1 tablet (25 mg total) by mouth daily. 01/02/24   Tolia, Sunit, DO      Allergies    Sulfa antibiotics    Review of Systems   Review of Systems A 10 point review of systems was performed and is negative unless otherwise reported in HPI.  Physical Exam Updated Vital Signs BP (!) 159/79   Pulse 77   Temp 98.3 F (36.8 C) (Oral)   Resp 18   Ht 5' 7 (1.702 m)   Wt 125.6 kg   SpO2 99%   BMI 43.38 kg/m  Physical Exam General: Normal appearing female, lying in bed.  HEENT: PERRLA, Sclera anicteric, MMM, trachea midline.  Cardiology: RRR, no murmurs/rubs/gallops.  Resp: Normal respiratory rate and effort. CTAB, no wheezes, rhonchi, crackles.  Abd: Soft, non-tender, non-distended. No rebound tenderness or guarding.  GU: Deferred. MSK: No peripheral edema or signs of trauma.  Extremities without deformity or TTP. No cyanosis or clubbing. Skin: warm, dry.  Back: No CVA tenderness Neuro: A&Ox4, CNs II-XII grossly intact. MAEs. Sensation grossly intact.  Psych: Anxious mood and affect.   ED Results / Procedures / Treatments   Labs (all labs ordered are listed, but only abnormal results are displayed) Labs Reviewed  CBC - Abnormal; Notable for the following components:      Result Value   RBC 5.92 (*)    MCV 76.2 (*)    MCH 23.6 (*)    RDW 18.7 (*)    All other components within normal limits  BASIC METABOLIC PANEL WITH GFR - Abnormal; Notable for the following components:   CO2 21 (*)    BUN 39 (*)    Creatinine, Ser 2.18 (*)    GFR,  Estimated 26 (*)    All other components within normal limits  BRAIN NATRIURETIC PEPTIDE - Abnormal; Notable for the following components:   B Natriuretic Peptide 119.1 (*)    All other components within normal limits  TROPONIN I (HIGH SENSITIVITY) - Abnormal; Notable for the following components:   Troponin I (High Sensitivity) 20 (*)    All other components within normal limits  URINALYSIS, ROUTINE W REFLEX MICROSCOPIC  D-DIMER, QUANTITATIVE  TROPONIN I (HIGH SENSITIVITY)    EKG EKG Interpretation Date/Time:  Friday January 02 2024 14:59:24 EDT Ventricular Rate:  101 PR Interval:  144 QRS Duration:  100 QT Interval:  366 QTC Calculation: 474 R Axis:   82  Text Interpretation: Sinus tachycardia Right atrial enlargement Confirmed by Franklyn Gills [phone]) on 01/02/2024 9:22:47 PM  Radiology DG Chest 2 View Result Date: 01/02/2024 CLINICAL DATA:  SOB EXAM: CHEST - 2 VIEW COMPARISON:  11/03/2014. FINDINGS: The heart size and mediastinal contours are within normal limits. Both lungs are clear. No pneumothorax or pleural effusion. There are thoracic degenerative changes. IMPRESSION: No acute cardiopulmonary disease. Electronically Signed   By: Fonda Field M.D.   On: 01/02/2024 17:54    Procedures Procedures    Medications Ordered in ED Medications - No data to display  ED Course/ Medical Decision Making/ A&P                          Medical Decision Making Amount and/or Complexity of Data Reviewed Labs: ordered. Decision-making details documented in ED Course. Radiology:  Decision-making details documented in ED Course.  Risk Prescription drug management.    This patient presents to the ED for concern of dyspnea/gen weakness, this involves an extensive number of treatment options, and is a complaint that carries with it a high risk of complications and morbidity.  I considered the following differential and admission for this acute, potentially life threatening  condition.  Patient is overall extremely well-appearing and hemodynamically stable.  MDM:    Patient with generalized weakness and dyspnea with exertion, shortness of breath.  She denies any orthopnea or lower extremity edema.  Her BNP is mildly elevated but she has no pulmonary edema and overall I doubt her failure exacerbation.  Her EKG does not demonstrate any arrhythmia or signs of ischemia and her troponin is negative.  Chest x-ray also does not demonstrate any pneumonia or pleural effusion.  She is largely very sedentary and has no signs of DVT but was tachypneic and mildly tachycardic on arrival with a low-grade fever, so consider PE or respiratory viral syndrome.  Her dimer is negative and her respiratory panel is negative.  She also complains of some generalized weakness, she is not anemic and with no significant electrolyte derangements to 2.18 from 1.18, however her last lab was drawn 3 years ago and so unclear the chronicity of this, the last consider that it is a recent AKI.  She has no flank pain or abdominal pain to suggest urolithiasis or an acute urologic emergency, but will need to obtain a urine.  Will be signed out to the oncoming team.   Clinical Course as of 01/07/24 1407  Fri Jan 02, 2024  2121 B Natriuretic Peptide(!): 119.1 Mildly elevated [HN]  2122 Troponin I (High Sensitivity): 17 neg [HN]  2122 Creatinine(!): 2.18 Increased from 1.18 three years ago [HN]  2122 DG Chest 2 View No acute cardiopulmonary disease. [HN]  2145 D-Dimer, Quant: 0.28 neg [HN]  2339 Resp panel by RT-PCR (RSV, Flu A&B, Covid) Anterior Nasal Swab neg [HN]  Sat Jan 03, 2024  0115 Discussed with patient that her workup is largely reassuring.  No obvious cause of shortness of breath.  O2 sats 100%.  Might have a UTI.  Urine is nitrite positive with many bacteria although not much white cells.  Will send culture and treat given patient's symptoms. [CH]  0154 Called to the patient's room as patient  is crying and upset about being discharged.  She states I hurt all over.  When asked to further elaborate.  She states that she has been hurting for years.  She is crying and yelling.  She states that if we discharged her that she will report us .  I tried to further query her anxiety around her symptoms.  I discussed with her that her workup is very reassuring at this time and even as she is talking to me in a very forced tone, her O2 sats are 100%.  She is in no observed respiratory distress. [CH]  0159 Added CK and repeat troponin.  Offered Ativan  and Tylenol .  Patient adamantly refused. [CH]  0159 Patient denies SI or HI. [CH]  0240 Talked with the patient again.  I have offered anxiety medication and Tylenol .  She initially stated that she would take additional workup and medications; however, per nursing, she has declined.  She is very tearful and anxious appearing.  Feels like majority of her current distress is related to her agoraphobia and generalized anxiety disorder.  She is refusing treatment or psychiatric evaluation.  She does appear to have capacity.  I have also offered home health services which she is declining.  I do not have anything further to offer the patient and will discharge. [CH]    Clinical Course User Index [CH] Horton, Charmaine FALCON, MD [HN] Franklyn Sid SAILOR, MD    Labs: I Ordered, and personally interpreted labs.  The pertinent results include:  those listed above  Imaging Studies ordered: CXR ordered from triage I independently visualized and interpreted imaging. I agree with the radiologist interpretation  Additional history obtained from chart review.    Cardiac Monitoring: The patient was maintained on a cardiac monitor.  I personally viewed and interpreted the cardiac monitored which showed an underlying rhythm of: NSR  Reevaluation: After the interventions noted above, I reevaluated the patient and found that they have :stayed the same  Social Determinants  of Health: Lives independently  Disposition:  Patient is signed out to the oncoming ED physician who is made aware of her history, presentation, exam, workup, and plan.    Co morbidities that complicate the patient evaluation  Past Medical History:  Diagnosis Date   Anxiety    Chronic systolic heart failure (HCC)    Depression    HTN (hypertension)    Microcytic anemia    NICM (nonischemic cardiomyopathy) (HCC) 11/2014   EF is 25% improved to 55% in 2015.   Pre-diabetes      Medicines No orders of the defined types were placed in this encounter.   I have reviewed the patients home medicines and have made adjustments as needed  Problem List / ED Course: Problem List Items Addressed This Visit   None Visit Diagnoses       SOB (shortness of breath)    -  Primary     GAD (generalized anxiety disorder)         Acute cystitis without hematuria                       This note was created using dictation software, which may contain spelling or grammatical errors.    Franklyn Sid SAILOR, MD 01/07/24 [phone]

## 2024-01-02 NOTE — ED Notes (Signed)
 Pt able to ambulate with a steady gait to and from nursing station, O2 stats remaining at 98% RA with pulse ox ranging from 108-113. Pt did voice c/o dizziness during ambulation.

## 2024-01-02 NOTE — ED Provider Triage Note (Addendum)
 Emergency Medicine Provider Triage Evaluation Note  Abigail Sharp , a 58 y.o. female  was evaluated in triage.  Pt complains of shortness of breath.  This has been ongoing for weeks and is worse with exertion, patient with history of CHF, has been out of her medications for at least 3 years.  No chest pain or lower extremity edema but reports that she urinates all the time.  She was previously on carvedilol , spironolactone , aspirin  but has not taken these.  She is on medication for depression/anxiety but otherwise does not take medication daily.  Review of Systems  Positive: As above Negative: As above  Physical Exam  BP 136/86 (BP Location: Right Arm)   Pulse 100   Temp 99.1 F (37.3 C)   Resp (!) 24   Ht 5' 7 (1.702 m)   Wt 125.6 kg   SpO2 94%   BMI 43.38 kg/m  Gen:   Awake, no distress   Resp:  Normal effort  MSK:   Moves extremities without difficulty  Other:    Medical Decision Making  Medically screening exam initiated at 2:51 PM.  Appropriate orders placed.  Abigail Sharp was informed that the remainder of the evaluation will be completed by another provider, this initial triage assessment does not replace that evaluation, and the importance of remaining in the ED until their evaluation is complete.  I was contacted by Dr. Michele who saw this patient in the outpatient setting earlier today, patient has severe mental health disorder as well as history of agoraphobia and never leaves the house, he states that she came to their office for help because she has become so short of breath, he was concerned that she may have a serious underlying etiology contributing to this given that it actually caused her to leave the house.  He was contacting me to relay that this patient would likely benefit from a PE/ACS workup given her worsening shortness of breath, he felt that she was euvolemic on exam and had no other notable findings and that it was less likely her shortness of breath was  secondary to heart failure despite her previous diagnosis of the same.        Abigail Sharp SAILOR, [place] 01/02/24 1537

## 2024-01-02 NOTE — Patient Instructions (Addendum)
 Medication Instructions:  START Carvedilol  6.25 mg twice daily  START Spironolactone  25 mg once daily in the morning   *If you need a refill on your cardiac medications before your next appointment, please call your pharmacy*  Lab Work: None ordered today. If you have labs (blood work) drawn today and your tests are completely normal, you will receive your results only by: MyChart Message (if you have MyChart) OR A paper copy in the mail If you have any lab test that is abnormal or we need to change your treatment, we will call you to review the results.  Testing/Procedures: Your physician has requested that you have an echocardiogram to be completed at next available slot. Echocardiography is a painless test that uses sound waves to create images of your heart. It provides your doctor with information about the size and shape of your heart and how well your heart's chambers and valves are working. This procedure takes approximately one hour. There are no restrictions for this procedure. Please do NOT wear cologne, perfume, aftershave, or lotions (deodorant is allowed). Please arrive 15 minutes prior to your appointment time.  Please note: We ask at that you not bring children with you during ultrasound (echo/ vascular) testing. Due to room size and safety concerns, children are not allowed in the ultrasound rooms during exams. Our front office staff cannot provide observation of children in our lobby area while testing is being conducted. An adult accompanying a patient to their appointment will only be allowed in the ultrasound room at the discretion of the ultrasound technician under special circumstances. We apologize for any inconvenience.   Follow-Up: At [facility], you and your health needs are our priority.  As part of our continuing mission to provide you with exceptional heart care, our providers are all part of one team.  This team includes your primary Cardiologist  (physician) and Advanced Practice Providers or APPs (Physician Assistants and Nurse Practitioners) who all work together to provide you with the care you need, when you need it.  Your next appointment:   6 week(s)  Provider:   Dayna Dunn, PA-C, Hao Meng, PA-C, or Glendia Ferrier, PA-C      Then, [place], DO will plan to see you again in 6 month(s).    We recommend signing up for the patient portal called MyChart.  Sign up information is provided on this After Visit Summary.  MyChart is used to connect with patients for Virtual Visits (Telemedicine).  Patients are able to view lab/test results, encounter notes, upcoming appointments, etc.  Non-urgent messages can be sent to your provider as well.   To learn more about what you can do with MyChart, go to [email or web address].   Other Instructions Please have your friend take you to ED to rule out pulmonary embolism as discussed during appointment.

## 2024-01-02 NOTE — Progress Notes (Signed)
 Cardiology Office Note:    Date:  01/02/2024  NAME:  Abigail Sharp    MRN: [id number] DOB:  June 25, 1965   PCP:  Freddrick No  Former Cardiology Providers: Dr. Victory Sharps Primary Cardiologist:  Madonna Large, DO, [facility] (established care 01/02/2024) Electrophysiologist:  None   Referring MD: No ref. provider found  Reason of Consult: Shortness of breath  Chief Complaint  Patient presents with   Shortness of Breath   New Patient (Initial Visit)    History of Present Illness:    Abigail Sharp is a 58 y.o. Caucasian female whose past medical history and cardiovascular risk factors includes: History of nonischemic cardiomyopathy and heart failure with improved EF (LVEF 25%, improved to 65% on medical therapy) hypertension, prediabetes, anxiety, depression, agoraphobia.   Formally under the care of Dr. Sharps who last saw Abigail Sharp back in August 2021. I am seeing her for the first time to re-establishing care.   Patient states that I have anxiety, depression, agoraphobia, and have not left the house for many years.  She has not been following up with cardiology due to insurance and lack of transportation which are social determinants of health.  She has these factors in order and therefore seeking medical attention.  Shortness of breath: Ongoing for the last several years.   Now progressive. More noticeable with ambulation and standing for prolonged period of time. Symptoms are worse when compared to her prior cardiac event in 2016 when she was diagnosed with nonischemic cardiomyopathy. She gets short of breath with activity such as going to the mailbox or the dumpster. No chest pain with exertion or at rest. She chooses not to leave the house due to her agoraphobia. She denies orthopnea, PND, lower extremity swelling. She denies any prior history of DVT or PE. Denies any recent prolonged sitting, surgeries. She has been out of her GDMT for several months.   According to medication list  that she was on carvedilol  6.25 mg p.o. twice daily and spironolactone  25 mg p.o. daily  Current Medications: Current Meds  Medication Sig   ARIPiprazole  (ABILIFY ) 5 MG tablet Take 1 tablet (5 mg total) by mouth daily.   aspirin  81 MG EC tablet Take 1 tablet (81 mg total) by mouth daily.   busPIRone  (BUSPAR ) 10 MG tablet Take 1 tablet (10 mg total) by mouth 2 (two) times daily.   lamoTRIgine  (LAMICTAL ) 25 MG tablet Take 1 tablet (25 mg total) by mouth 2 (two) times daily.   sertraline  (ZOLOFT ) 100 MG tablet Take 2 tablets (200 mg total) by mouth daily.     Allergies:    Sulfa antibiotics   Past Medical History: Past Medical History:  Diagnosis Date   Anxiety    Chronic systolic heart failure (HCC)    Depression    HTN (hypertension)    Microcytic anemia    NICM (nonischemic cardiomyopathy) (HCC) 11/2014   EF is 25% improved to 55% in 2015.   Pre-diabetes     Past Surgical History: Past Surgical History:  Procedure Laterality Date   CARDIAC CATHETERIZATION N/A 11/07/2014   Procedure: Right/Left Heart Cath and Coronary Angiography;  Surgeon: Dorn JINNY Lesches, MD;  Location: [facility] INVASIVE CV LAB;  Service: Cardiovascular;  Laterality: N/A;   TONSILLECTOMY     TUBAL LIGATION      Social History: Social History   Tobacco Use   Smoking status: Never   Smokeless tobacco: Never  Vaping Use   Vaping status: Never Used  Substance Use  Topics   Alcohol use: No    Alcohol/week: 0.0 standard drinks of alcohol   Drug use: No    Family History: Family History  Problem Relation Age of Onset   Diabetes Mother    Hypertension Mother    Diabetes Father    Heart disease Father    Hypertension Father    Diabetes Sister    ADD / ADHD Son    Alcohol abuse Neg Hx    Anxiety disorder Neg Hx    Bipolar disorder Neg Hx    Depression Neg Hx    Drug abuse Neg Hx     ROS:   Review of Systems  Cardiovascular:  Positive for chest pain (vague anterior discomfort) and dyspnea on  exertion. Negative for claudication, irregular heartbeat, leg swelling, near-syncope, orthopnea, palpitations, paroxysmal nocturnal dyspnea and syncope.  Respiratory:  Positive for shortness of breath.   Hematologic/Lymphatic: Negative for bleeding problem.    EKGs/Labs/Other Studies Reviewed:   EKG: EKG Interpretation Date/Time:  Friday January 02 2024 11:07:46 EDT Ventricular Rate:  94 PR Interval:  146 QRS Duration:  96 QT Interval:  368 QTC Calculation: 460 R Axis:   89  Text Interpretation: Normal sinus rhythm Normal ECG When compared with ECG of 04-Nov-2014 01:26, Nonspecific T wave abnormality no longer evident in Inferior leads Nonspecific T wave abnormality no longer evident in Lateral leads Confirmed by Michele Richardson [phone]) on 01/02/2024 11:28:03 AM  Echocardiogram: 12/15/2020 LVEF 60 to 65%, no regional wall motion abnormalities, normal diastolic function, right ventricular size and function normal  Heart Catheterization: 11/2014 Normal coronary arteries with elevated filling pressures consistent with dilated nonischemic coronary myopathy.   Labs:    Latest Ref Rng & Units 08/23/2015   10:28 AM 12/06/2014   12:21 PM 11/08/2014    3:50 AM  CBC  WBC 4.0 - 10.5 K/uL 6.0  10.5  5.6   Hemoglobin 12.0 - 15.0 g/dL 87.3  86.0  89.4   Hematocrit 36.0 - 46.0 % 38.4  44.6  34.1   Platelets 150 - 400 K/uL 219  241.0  254        Latest Ref Rng & Units 04/05/2020   10:50 AM 01/12/2020    4:14 PM 09/11/2017    2:58 PM  BMP  Glucose 65 - 99 mg/dL 81  87  78   BUN 6 - 24 mg/dL 19  34  12   Creatinine 0.57 - 1.00 mg/dL 8.78  8.71  8.91   BUN/Creat Ratio 9 - 23 16  27  11    Sodium 134 - 144 mmol/L 140  140  138   Potassium 3.5 - 5.2 mmol/L 4.4  4.8  4.8   Chloride 96 - 106 mmol/L 103  101  99   CO2 20 - 29 mmol/L 23  23  26    Calcium 8.7 - 10.2 mg/dL 9.3  89.9  9.5       Latest Ref Rng & Units 04/05/2020   10:50 AM 01/12/2020    4:14 PM 09/11/2017    2:58 PM  CMP  Glucose 65 -  99 mg/dL 81  87  78   BUN 6 - 24 mg/dL 19  34  12   Creatinine 0.57 - 1.00 mg/dL 8.78  8.71  8.91   Sodium 134 - 144 mmol/L 140  140  138   Potassium 3.5 - 5.2 mmol/L 4.4  4.8  4.8   Chloride 96 - 106 mmol/L 103  101  99  CO2 20 - 29 mmol/L 23  23  26    Calcium 8.7 - 10.2 mg/dL 9.3  89.9  9.5     Lab Results  Component Value Date   CHOL 189 08/23/2015   HDL 36 (L) 08/23/2015   LDLCALC 95 08/23/2015   TRIG 290 (H) 08/23/2015   CHOLHDL 5.3 (H) 08/23/2015   No results for input(s): LIPOA in the last 8760 hours. No components found for: NTPROBNP No results for input(s): PROBNP in the last 8760 hours. No results for input(s): TSH in the last 8760 hours.  Physical Exam:    Today's Vitals   01/02/24 1059  BP: 122/79  Pulse: 93  Resp: 16  SpO2: 97%  Weight: 277 lb (125.6 kg)  Height: 5' 7 (1.702 m)   Body mass index is 43.38 kg/m. Wt Readings from Last 3 Encounters:  01/02/24 277 lb (125.6 kg)  01/12/20 254 lb (115.2 kg)  02/02/19 260 lb (117.9 kg)    Physical Exam  Constitutional: No distress.  hemodynamically stable  Neck: No JVD present.  Cardiovascular: Normal rate, regular rhythm, S1 normal and S2 normal. Exam reveals no gallop, no S3 and no S4.  No murmur heard. Pulmonary/Chest: Effort normal and breath sounds normal. No stridor. She has no wheezes. She has no rales.  Musculoskeletal:        General: No edema.     Cervical back: Neck supple.  Skin: Skin is warm.     Impression & Recommendation(s):  Impression:   ICD-10-CM   1. Shortness of breath  R06.02 EKG 12-Lead    spironolactone  (ALDACTONE ) 25 MG tablet    ECHOCARDIOGRAM COMPLETE    2. Heart failure with improved ejection fraction (HFimpEF) (HCC)  I50.32 spironolactone  (ALDACTONE ) 25 MG tablet    3. Benign hypertension  I10 carvedilol  (COREG ) 6.25 MG tablet    spironolactone  (ALDACTONE ) 25 MG tablet    4. Class 3 severe obesity due to excess calories with serious comorbidity and body  mass index (BMI) of 40.0 to 44.9 in adult  E66.813    Z68.41        Recommendation(s):  Shortness of breath Chronic and progressive Clinically euvolemic and not in acute heart failure. Denies anginal chest pain. EKG is nonischemic. Given her psychiatric comorbidities such as anxiety, depression, agoraphobia she does not leave her house very often.  The fact that she comes in today due to worsening symptoms makes me more concerned. She gets short of breath with walking around the house, going up to the dumpster or the mailbox. She has no history of DVT or PE. Given the progressive nature of the symptoms a more expedited evaluation is warranted Need to rule out PE and ACS.  Latter is lower on the differential. Recommended going to the ED.  Patient refuses EMS transfer and states that her transportation will take her there.  I have asked her not to leave the ER until the workup is complete.  Patient is agreeable with the plan of care as she is quite concerned due to this progressiveness of her symptoms.  Heart failure with improved ejection fraction (HFimpEF) (HCC) Echo 2016: LVEF 25% with diffuse global hypokinesis Echo July 2022: LVEF 60 to 65% Clinically euvolemic. Has not been on heart failure medications for months to years Will refill her carvedilol  6.25 mg p.o. twice daily. Refill spironolactone  25 mg p.o. daily Echo will be ordered to evaluate for structural heart disease and left ventricular systolic function.  Benign hypertension Office blood pressures  are acceptable. Medications as discussed above  Class 3 severe obesity due to excess calories with serious comorbidity and body mass index (BMI) of 40.0 to 44.9 in adult Body mass index is 43.38 kg/m. I reviewed with her importance of diet, regular physical activity/exercise, weight loss.   Patient is educated on the importance of increasing physical activity gradually as tolerated with a goal of moderate intensity exercise  for 30 minutes a day 5 days a week.  Independently reviewed the echocardiogram results from 11/04/2014 and 12/15/2020 Reviewed last progress note from Dr. Claudene 01/12/2020 Reviewed last heart catheterization report from June 2016 Prescription drug management Additional diagnostic workup ordered Shared decision was to transfer her to ED for more expedited evaluation for shortness of breath.  Patient refused EMS transfer and will have a transportation taken to [name] ED for further evaluation and management.  Orders Placed:  Orders Placed This Encounter  Procedures   EKG 12-Lead   ECHOCARDIOGRAM COMPLETE    Standing Status:   Future    Expected Date:   01/09/2024    Expiration Date:   01/01/2025    [facility]    Does the patient weigh less than or greater than 250 lbs?:   Patient weighs greater than 250 lbs             277 lbs    Perflutren DEFINITY (image enhancing agent) should be administered unless hypersensitivity or allergy exist:   Administer Perflutren    Reason for exam-Echo:   Other-Full Diagnosis List    Full ICD-10/Reason for Exam:   Shortness of breath [786.05.ICD-9-CM]     Final Medication List:    Meds ordered this encounter  Medications   carvedilol  (COREG ) 6.25 MG tablet    Sig: Take 1 tablet (6.25 mg total) by mouth 2 (two) times daily with a meal.    Dispense:  60 tablet    Refill:  3   spironolactone  (ALDACTONE ) 25 MG tablet    Sig: Take 1 tablet (25 mg total) by mouth daily.    Dispense:  30 tablet    Refill:  3    Pt must keep upcoming appointment in August 2023 with Dr. Claudene before anymore refills. Thank you Final Attempt    Medications Discontinued During This Encounter  Medication Reason   spironolactone  (ALDACTONE ) 25 MG tablet Reorder   carvedilol  (COREG ) 6.25 MG tablet Reorder     Current Outpatient Medications:    ARIPiprazole  (ABILIFY ) 5 MG tablet, Take 1 tablet (5 mg total) by mouth daily.,  Disp: 90 tablet, Rfl: 0   aspirin  81 MG EC tablet, Take 1 tablet (81 mg total) by mouth daily., Disp: 90 tablet, Rfl: 3   busPIRone  (BUSPAR ) 10 MG tablet, Take 1 tablet (10 mg total) by mouth 2 (two) times daily., Disp: 180 tablet, Rfl: 0   lamoTRIgine  (LAMICTAL ) 25 MG tablet, Take 1 tablet (25 mg total) by mouth 2 (two) times daily., Disp: 180 tablet, Rfl: 0   sertraline  (ZOLOFT ) 100 MG tablet, Take 2 tablets (200 mg total) by mouth daily., Disp: 180 tablet, Rfl: 0   carvedilol  (COREG ) 6.25 MG tablet, Take 1 tablet (6.25 mg total) by mouth 2 (two) times daily with a meal., Disp: 60 tablet, Rfl: 3   spironolactone  (ALDACTONE ) 25 MG tablet, Take 1 tablet (25 mg total) by mouth daily., Disp: 30 tablet, Rfl: 3  Consent:   N/A  Disposition:   6 weeks  with APP and 6 months with myself  Patient may be asked to follow-up sooner based on the results of the above-mentioned testing.  Her questions and concerns were addressed to her satisfaction. She voices understanding of the recommendations provided during this encounter.    Signed, Madonna Michele HAS, [facility]  A Division of [facility] [address] [place], [place] [address]  [place], [place] [address] 01/02/2024 12:37 PM

## 2024-01-02 NOTE — ED Triage Notes (Signed)
 Pt to ED c/o SHOB x months. Reports hx of CHF and off medications for years. NAD in triage.

## 2024-01-02 NOTE — ED Triage Notes (Signed)
 Pt came in via POV d/t SOB for the past few years & the past several months since she has not been able to see her cardiologist & has not had any of her CHF meds for the past 3 years. Pt states she is having a hard time getting anything accomplished d/t SOB. A/Ox4, rates chronic knee pain 7/10 during triage.

## 2024-01-03 LAB — URINALYSIS, ROUTINE W REFLEX MICROSCOPIC
Bilirubin Urine: NEGATIVE
Glucose, UA: NEGATIVE mg/dL
Hgb urine dipstick: NEGATIVE
Ketones, ur: NEGATIVE mg/dL
Leukocytes,Ua: NEGATIVE
Nitrite: POSITIVE — AB
Protein, ur: NEGATIVE mg/dL
Specific Gravity, Urine: 1.019 (ref 1.005–1.030)
pH: 5 (ref 5.0–8.0)

## 2024-01-03 LAB — CK: Total CK: 52 U/L (ref 38–234)

## 2024-01-03 LAB — TROPONIN I (HIGH SENSITIVITY): Troponin I (High Sensitivity): 18 ng/L — ABNORMAL HIGH (ref <18)

## 2024-01-03 MED ORDER — CEPHALEXIN 250 MG PO CAPS
500.0000 mg | ORAL_CAPSULE | Freq: Once | ORAL | Status: DC
  Filled 2024-01-03: qty 2

## 2024-01-03 MED ORDER — CEPHALEXIN 500 MG PO CAPS: 500.0000 mg | ORAL_CAPSULE | Freq: Three times a day (TID) | ORAL | 0 refills | Status: DC

## 2024-01-03 MED ORDER — LORAZEPAM 2 MG/ML IJ SOLN
1.0000 mg | Freq: Once | INTRAMUSCULAR | Status: DC
  Filled 2024-01-03: qty 1

## 2024-01-03 MED ORDER — ACETAMINOPHEN 500 MG PO TABS
1000.0000 mg | ORAL_TABLET | Freq: Once | ORAL | Status: DC
  Filled 2024-01-03: qty 2

## 2024-01-03 NOTE — ED Notes (Signed)
 Pt refused this nurse as her nurse, she refused this nurse to take her IV out. Dr. Bari is at bedside and removed pt's line

## 2024-01-03 NOTE — Discharge Instructions (Signed)
 You were seen today primarily for shortness of breath.  You also endorsed generalized pain.  Your workup today is reassuring.  There does not appear to be an emergent cause of your symptoms.  You seemed very distressed regarding your symptoms and anxious.  You were offered medications and home health as well as psychiatric services which you declined.  If at anytime you wish to have these, follow-up with your primary doctor or return to the emergency department if you feel it is an emergency.  Follow-up with your psychiatrist for any adjustments in your medications.

## 2024-01-03 NOTE — ED Notes (Addendum)
 Pt is refusing to use wheelchair to the lobby despite c/o SOB and sating at 95%

## 2024-01-03 NOTE — ED Provider Notes (Addendum)
 Patient signed out pending urinalysis.  In brief presents with progressive shortness of breath.  Was seen by cardiology yesterday.  Reported history of CHF.  Does not appear volume overloaded.  Workup today has been reassuring including heart testing, chest x-ray, screening for blood clots.  She ambulated with pulse ox and has maintained her oxygen saturations.  12:25 AM Urinalysis is nitrite positive with many bacteria.  Few white cells.  Will send a culture.  Given her symptoms of frequency, will plan to treat with Keflex .  Otherwise will discharge home.  2:44 AM See clinical course below.  Patient had significant distress regarding her discharge home.  She endorsed pain all over and additional symptoms.  At no time was she in respiratory distress.  She was offered additional workup and medications.  Objectively, she appears quite anxious but declines psychiatric evaluation, home health services, additional medications.  She also appears to have capacity so this cannot be forced upon her.  Discussed with her that barring additional medications or workup, she will be discharged home.  3:46 AM CK and repeat troponin are reassuring.  Will message cardiologist, Dr. Michele regarding outpatient ischemic evaluation.  3:49 AM Message sent to patient's cardiologist regarding any further outpatient cardiology evaluation.  Cardiac catheterization in 2016 with normal coronaries.  EKG without any ischemic changes.  Physical Exam  BP (!) 155/110 (BP Location: Right Arm)   Pulse 79   Temp 98.3 F (36.8 C) (Oral)   Resp 17   Ht 1.702 m (5' 7)   Wt 125.6 kg   SpO2 100%   BMI 43.38 kg/m   Procedures  Procedures  ED Course / MDM   Clinical Course as of 01/03/24 0243  Fri Jan 02, 2024  2121 B Natriuretic Peptide(!): 119.1 Mildly elevated [HN]  2122 Troponin I (High Sensitivity): 17 neg [HN]  2122 Creatinine(!): 2.18 Increased from 1.18 three years ago [HN]  2122 DG Chest 2 View No acute  cardiopulmonary disease. [HN]  2145 D-Dimer, Quant: 0.28 neg [HN]  2339 Resp panel by RT-PCR (RSV, Flu A&B, Covid) Anterior Nasal Swab neg [HN]  Sat Jan 03, 2024  0115 Discussed with patient that her workup is largely reassuring.  No obvious cause of shortness of breath.  O2 sats 100%.  Might have a UTI.  Urine is nitrite positive with many bacteria although not much white cells.  Will send culture and treat given patient's symptoms. [CH]  0154 Called to the patient's room as patient is crying and upset about being discharged.  She states I hurt all over.  When asked to further elaborate.  She states that she has been hurting for years.  She is crying and yelling.  She states that if we discharged her that she will report us .  I tried to further query her anxiety around her symptoms.  I discussed with her that her workup is very reassuring at this time and even as she is talking to me in a very forced tone, her O2 sats are 100%.  She is in no observed respiratory distress. [CH]  0159 Added CK and repeat troponin.  Offered Ativan  and Tylenol .  Patient adamantly refused. [CH]  0159 Patient denies SI or HI. [CH]  0240 Talked with the patient again.  I have offered anxiety medication and Tylenol .  She initially stated that she would take additional workup and medications; however, per nursing, she has declined.  She is very tearful and anxious appearing.  Feels like majority of her current  distress is related to her agoraphobia and generalized anxiety disorder.  She is refusing treatment or psychiatric evaluation.  She does appear to have capacity.  I have also offered home health services which she is declining.  I do not have anything further to offer the patient and will discharge. [CH]    Clinical Course User Index [CH] Davari Lopes, Charmaine FALCON, MD [HN] Franklyn Sid SAILOR, MD   Medical Decision Making Amount and/or Complexity of Data Reviewed Labs: ordered. Decision-making details documented in ED  Course. Radiology:  Decision-making details documented in ED Course.  Risk OTC drugs. Prescription drug management.          Bari Charmaine FALCON, MD 01/03/24 9973    Bari Charmaine FALCON, MD 01/03/24 9754    Bari Charmaine FALCON, MD 01/03/24 9652    Bari Charmaine FALCON, MD 01/03/24 [phone]

## 2024-01-03 NOTE — ED Notes (Signed)
 This nurse went to medicate pt, pt became upset, crying and shouting about not wanting to go home. Pt states she can not breath while sating at 100% RA. Pt state she have not been outside her home in 3 yrs d/t anxiety and depression. Message sent to Dr, Bari while in room, she came at bedside trying to address pt's concerns, pt continued to shout and yelling at Dr. Bari. She offered homehealth to pt, pt deny referral, Dr. Bari placed an order for tylenol  for pain and Ativan  for anxiety- Pt refused. Pt states she's not taking anything or giving us  any more blood since we're sending her home. Charge nurse made aware of the situation and is at bedside with patient at this time.

## 2024-01-03 NOTE — ED Notes (Signed)
 Pt noted to be yelling at this RN when this RN addressed pts complaint. Noted that even while pt was yelling pt oxygen did not drop under 95%

## 2024-01-05 ENCOUNTER — Other Ambulatory Visit (HOSPITAL_COMMUNITY): Payer: Self-pay

## 2024-01-05 ENCOUNTER — Telehealth: Payer: Self-pay | Admitting: Cardiology

## 2024-01-05 DIAGNOSIS — I5032 Chronic diastolic (congestive) heart failure: Secondary | ICD-10-CM

## 2024-01-05 DIAGNOSIS — R0602 Shortness of breath: Secondary | ICD-10-CM

## 2024-01-05 DIAGNOSIS — I10 Essential (primary) hypertension: Secondary | ICD-10-CM

## 2024-01-05 LAB — URINE CULTURE: Culture: 70000 — AB

## 2024-01-05 MED ORDER — SPIRONOLACTONE 25 MG PO TABS: 25.0000 mg | ORAL_TABLET | Freq: Every day | ORAL | 3 refills | Status: DC

## 2024-01-05 MED ORDER — CARVEDILOL 6.25 MG PO TABS: 6.2500 mg | ORAL_TABLET | Freq: Two times a day (BID) | ORAL | 3 refills | Status: DC

## 2024-01-05 NOTE — Telephone Encounter (Signed)
*  STAT* If patient is at the pharmacy, call can be transferred to refill team.   1. Which medications need to be refilled? (please list name of each medication and dose if known) carvedilol  (COREG ) 6.25 MG tablet  and spironolactone  (ALDACTONE ) 25 MG tablet    2. Would you like to learn more about the convenience, safety, & potential cost savings by using the [facility]? NO     3. Are you open to using the [facility] NO   4. Which pharmacy/location (including street and city if local pharmacy) is medication to be sent to?  [id number] - [place],  - [address] AT [facility] [address]    5. Do they need a 30 day or 90 day supply? 90  Patient wants 3 month supply sent to [facility] and not [facility]

## 2024-01-06 ENCOUNTER — Telehealth (HOSPITAL_BASED_OUTPATIENT_CLINIC_OR_DEPARTMENT_OTHER): Payer: Self-pay | Admitting: *Deleted

## 2024-01-06 NOTE — Telephone Encounter (Signed)
 Post ED Visit - Positive Culture Follow-up  Culture report reviewed by antimicrobial stewardship pharmacist: [name] []  [name], Pharm.D. [x]  [name], Pharm.D., BCPS AQ-ID []  [name], Pharm.D., BCPS []  [name], Pharm.D., BCPS []  [name], [address].D., BCPS, AAHIVP []  [name], Pharm.D., BCPS, AAHIVP []  [name], PharmD, BCPS []  [name], PharmD, BCPS []  [name], PharmD, BCPS []  [name], PharmD []  [name], PharmD, BCPS []  [name], PharmD  [name] []  [name], PharmD []  [name], PharmD []  [name], PharmD []  [name], Rph []  [name]) [name], PharmD []  [name], PharmD []  [name], PharmD []  [name], PharmD []  [name], PharmD []  [name], PharmD []  [name], PharmD []  [name], PharmD []  [name], PharmD   Positive urine culture Treated with cephalexin , organism sensitive to the same and no further patient follow-up is required at this time.  Abigail Sharp 01/06/2024, 10:37 AM

## 2024-01-16 ENCOUNTER — Other Ambulatory Visit (HOSPITAL_COMMUNITY): Payer: Self-pay

## 2024-01-30 ENCOUNTER — Other Ambulatory Visit (HOSPITAL_COMMUNITY): Payer: Self-pay

## 2024-02-04 ENCOUNTER — Ambulatory Visit: Payer: Self-pay

## 2024-02-04 NOTE — Telephone Encounter (Signed)
 FYI Only or Action Required?: FYI only for provider.  Called Nurse Triage reporting Shortness of Breath.  Symptoms began several days ago.  Symptoms are: gradually worsening.  Triage Disposition: See HCP Within 4 Hours (Or PCP Triage)  Patient/caregiver understands and will follow disposition?: Unsure           Copied from CRM #[id number]. Topic: Clinical - Red Word Triage >> Feb 04, 2024  5:09 PM Mercer PEDLAR wrote: Red Word that prompted transfer to Nurse Triage: went to ER on 01/02/24 and was admitted. Needs PCP to follow up. Currently having difficulty breathing and shortness of breath.          Reason for Disposition  [1] Longstanding difficulty breathing (e.g., CHF, COPD, emphysema) AND [2] WORSE than normal  Answer Assessment - Initial Assessment Questions 1. RESPIRATORY STATUS: Describe your breathing? (e.g., wheezing, shortness of breath, unable to speak, severe coughing)      Increased shortness of breath  2. ONSET: When did this breathing problem begin?      5 days ago  3. PATTERN Does the difficult breathing come and go, or has it been constant since it started?      Constant  4. SEVERITY: How bad is your breathing? (e.g., mild, moderate, severe)      Mild 5. RECURRENT SYMPTOM: Have you had difficulty breathing before? If Yes, ask: When was the last time? and What happened that time?      Yes, history of CHF 6. CARDIAC HISTORY: Do you have any history of heart disease? (e.g., heart attack, angina, bypass surgery, angioplasty)      Congestive Heart Failure 7. LUNG HISTORY: Do you have any history of lung disease?  (e.g., pulmonary embolus, asthma, emphysema)     No 8. CAUSE: What do you think is causing the breathing problem?      Unsure  9. OTHER SYMPTOMS: Do you have any other symptoms? (e.g., chest pain, cough, dizziness, fever, runny nose)     Cough, nasal congestion, body aches  Protocols used: Breathing Difficulty-A-AH

## 2024-02-06 ENCOUNTER — Ambulatory Visit: Payer: Self-pay

## 2024-02-06 NOTE — Telephone Encounter (Signed)
 FYI Only or Action Required?: FYI only for provider.  Patient was last seen in primary care on New pt.  Called Nurse Triage reporting Advice Only.  Symptoms began Ongoing.  Interventions attempted: Other: ED.  Symptoms are: stable.  Triage Disposition: See PCP Within 2 Weeks  Patient/caregiver understands and will follow disposition?: Yes - Helped pt schedule NP appt. Pt will go to UC tomorrow for care.             Copied from CRM #[id number]. Topic: Clinical - Red Word Triage >> Feb 06, 2024  4:43 PM Abigail Sharp wrote: Red Word that prompted transfer to Nurse Triage: pt is having some difficulty with sob. Cough.  She is a heart patient but does not have a primary Reason for Disposition  Requesting regular office appointment  Answer Assessment - Initial Assessment Questions 1. REASON FOR CALL: What is the main reason for your call? or How can I best help you?     Pt has called 2 times to make NP appt. Pt is transferred to NT and sent to ED or UC. PT has gone to ED and needs PCP for continued care. 2. SYMPTOMS : Do you have any symptoms?      Cough/sob  Protocols used: Information Only Call - No Triage-A-AH

## 2024-02-09 NOTE — Progress Notes (Deleted)
 BH MD/PA/NP OP Progress Note  02/09/2024 11:46 AM Abigail Sharp  MRN:  [id number]  Visit Diagnosis: No diagnosis found.  Assessment: Abigail Sharp is a 58 y.o. female with a history of GAD, MDD, OCD, agoraphobia, and CHF who presented to [facility] at [facility] for initial evaluation on 01/21/2023 following transition from Dr. Brutus.  At initial evaluation patient reported significant symptoms of anxiety including excessive worry, ruminations, racing thoughts, increased irritability, restlessness, and fears something awful happening.  She can also experience panic like symptoms with body tremors and shortness of breath when triggered by leaving or having thoughts of leaving the house.  Patient endorsed a history of obsessional thoughts primarily around cleanliness and excessive handwashing.  While still present this had improved compared to the past.  With the excessive anxiety leading to isolation patient has in turn had increased depression.  She endorsed low mood, anhedonia, feelings of worthlessness, feelings of hopelessness, disturbed sleep, and poor appetite.  She denies any SI/HI or thoughts of self-harm.  Patient met criteria for MDD, GAD, OCD, and agoraphobia.  Tyla Burgner presents for follow-up evaluation. Today, 02/09/24, patient reports ***   feelings of falling, hearing/seeing things, and increased anxiety in the interim.  Notably patient has some limitations making history of it harder to obtain.  That said it appears that the symptoms she is describing are occurring primarily at night while she is asleep.  Then upon waking up she recalls the nightmares and at first thinks they are still occurring however upon checking her surroundings does not notice any of the previously identified visual or auditory hallucinations.  Since these are nightmares we discussed starting prazosin to better manage them reviewed the risk and benefits.  Patient however has not had blood  pressure checked in 4 years and we would want to have an updated reading prior to starting this medication.  She does have a home blood pressure cuff and reported being able to check it and message to provide her with the updated results.  We also expressed that we would like to see patient in person for her next visit which will also allow us  to update her metabolic labs.  Psychotherapeutic interventions were used during today's session. From 1:05 PM to 1:30 PM. Therapeutic interventions included empathic listening, supportive therapy, cognitive and behavioral therapy, motivational interviewing. Used supportive interviewing techniques to provide emotional validation. Worked on cognitive reframing techniques and recommendations made for behavioral activation.  Improvement was evidenced by patient's participation and identified commitment to therapy goals.   Plan: - Continue Zoloft  200 mg QD - Continue Lamictal  25 mg BID - Continue BuSpar  10 mg BID - Continue Abilify  5 mg daily - Will start Prazosin 1 mg daily, pending patient sending in her blood pressure results - CBC, CMP, Lipid profile, A1c, TSH, Vitamin D  ordered - Restart therapy with Darleene - Crisis resources reviewed - Follow up in 2 months   Chief Complaint: No chief complaint on file.  HPI: ***   Past Psychiatric History: *** Dx: Depression and Anxiety (agoraphobia) Meds: Celexa , Zoloft , BuSpar , Lamictal , Depakote , gabapentin , trazodone  Previous psychiatrist/therapist: Dr. Brutus 2017-2024, PCP was prescribing Celexa  Hospitalizations: denies SIB: denies Suicide attempts: denies Hx of violent behavior towards others: denies Current access to guns: denies Hx of abuse:  Military Hx: denies Hx of Seizures: denies Hx of TBI: denies   Past Medical History:  Past Medical History:  Diagnosis Date   Anxiety    Chronic systolic heart failure (HCC)  Depression    HTN (hypertension)    Microcytic anemia    NICM (nonischemic  cardiomyopathy) (HCC) 11/2014   EF is 25% improved to 55% in 2015.   Pre-diabetes     Past Surgical History:  Procedure Laterality Date   CARDIAC CATHETERIZATION N/A 11/07/2014   Procedure: Right/Left Heart Cath and Coronary Angiography;  Surgeon: Dorn JINNY Lesches, MD;  Location: [facility] INVASIVE CV LAB;  Service: Cardiovascular;  Laterality: N/A;   TONSILLECTOMY     TUBAL LIGATION      Family History:  Family History  Problem Relation Age of Onset   Diabetes Mother    Hypertension Mother    Diabetes Father    Heart disease Father    Hypertension Father    Diabetes Sister    ADD / ADHD Son    Alcohol abuse Neg Hx    Anxiety disorder Neg Hx    Bipolar disorder Neg Hx    Depression Neg Hx    Drug abuse Neg Hx     Social History:  Social History   Socioeconomic History   Marital status: Single    Spouse name: Not on file   Number of children: Not on file   Years of education: Not on file   Highest education level: Not on file  Occupational History   Not on file  Tobacco Use   Smoking status: Never   Smokeless tobacco: Never  Vaping Use   Vaping status: Never Used  Substance and Sexual Activity   Alcohol use: No    Alcohol/week: 0.0 standard drinks of alcohol   Drug use: No   Sexual activity: Never  Other Topics Concern   Not on file  Social History Narrative   Social Hx:   Current living situation: Lives alone in apt in [place]: [place], [place] by parents.   Raised by parents. Parents let her do whatever she wanted to do while growing up. Both parents now deceased.    Siblings 1 brother and 2 sisters. Pt is number 3   Schooling- HS grad, faced a lot of bullied due to teen pregnancy at 53. Lived with parents while raising her oldest child but for the youngest she moved out temporarily. Her mom took over raising him at the age of 77 mo.    Employed- last job in 2002. 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 90 tablet 0   aspirin  81 MG EC tablet Take 1 tablet (81 mg total) by mouth daily. 90 tablet 3   busPIRone  (BUSPAR ) 10 MG tablet Take 1 tablet (10 mg total) by mouth 2 (two) times daily. 180 tablet 0   carvedilol  (COREG ) 6.25 MG tablet Take 1 tablet (6.25 mg total) by mouth 2 (two) times daily with  a meal. 90 tablet 3   cephALEXin  (KEFLEX ) 500 MG capsule Take 1 capsule (500 mg total) by mouth 3 (three) times daily. 20 capsule 0   lamoTRIgine  (LAMICTAL ) 25 MG tablet Take 1 tablet (25 mg total) by mouth 2 (two) times daily. 180 tablet 0   sertraline  (ZOLOFT ) 100 MG tablet Take 2 tablets (200 mg total) by mouth daily. 180 tablet 0   spironolactone  (ALDACTONE ) 25 MG tablet Take 1 tablet (25 mg total) by mouth daily. 90 tablet 3   No current facility-administered medications for this visit.     Musculoskeletal: Strength & Muscle Tone: {desc; muscle tone:32375} Gait & Station: {PE GAIT ED WJUO:77474} Patient leans: {Patient Leans:21022755}  Psychiatric Specialty Exam: There were no vitals taken for this visit.There is no height or weight on file to calculate BMI. Review of Systems  General Appearance: {Appearance:22683}  Eye Contact:  {BHH EYE CONTACT:22684}  Speech:  {Speech:22685}  Volume:  {Volume (PAA):22686}  Mood:  {BHH MOOD:22306}  Affect:  {Affect (PAA):22687}  Thought Content: {Thought Content:22690}   Suicidal Thoughts:  {ST/HT  (PAA):22692}  Homicidal Thoughts:  {ST/HT (PAA):22692}  Thought Process:  {Thought Process (PAA):22688}  Orientation:  {BHH ORIENTATION (PAA):22689}    Memory: {BHH MEMORY:22881}  Judgment:  {Judgement (PAA):22694}  Insight:  {Insight (PAA):22695}  Concentration:  {Concentration:21399}  Recall:  not formally assessed ***  Fund of Knowledge: {BHH GOOD/FAIR/POOR:22877}  Language: {BHH GOOD/FAIR/POOR:22877}  Psychomotor Activity:  {Psychomotor (PAA):22696}  Akathisia:  {BHH YES OR NO:22294}  AIMS (if indicated): {Desc; done/not:10129}  Assets:  {Assets (PAA):22698}  ADL's:  {BHH JIO'D:77709}  Cognition: {chl bhh cognition:304700322}  Sleep:  {BHH GOOD/FAIR/POOR:22877}   Metabolic Disorder Labs: Lab Results  Component Value Date   HGBA1C 5.7 (H) 11/05/2014   MPG 117 11/05/2014   No results found for: PROLACTIN Lab Results  Component Value Date   CHOL 189 08/23/2015   TRIG 290 (H) 08/23/2015   HDL 36 (L) 08/23/2015   CHOLHDL 5.3 (H) 08/23/2015   VLDL 58 (H) 08/23/2015   LDLCALC 95 08/23/2015   LDLCALC 79 11/05/2014   Lab Results  Component Value Date   TSH 3.33 08/23/2015   TSH 1.192 11/04/2014    Therapeutic Level Labs: No results found for: LITHIUM No results found for: VALPROATE No results found for: CBMZ   Screenings: GAD-7    Flowsheet Row Counselor from 06/19/2023 in [facility] at [facility] Video Visit from 01/21/2023 in [facility] Office Visit from 04/17/2016 in [facility] [place] - A Dept Of [place] Office Visit from 11/21/2015 in [facility] [place] - A Dept Of [place] DEL. [facility] Office Visit from 08/28/2015 in [facility] [place] - A Dept Of [place] [facility]. [facility]  Total GAD-7 Score 11 14 8 13 16    PHQ2-9    Flowsheet Row Counselor from 06/19/2023 in [facility] at  [facility] Video Visit from 01/21/2023 in [facility] Video Visit from 11/21/2022 in [facility] Video Visit from 08/22/2022 in [facility] Video Visit from 06/06/2022 in [facility]  PHQ-2 Total Score 5 5 5 5 6   PHQ-9 Total Score 18 18 19 20 21    Flowsheet Row ED from 01/02/2024 in [facility] Emergency Department at [facility] Counselor from 06/19/2023 in [facility] at [facility] Video Visit from 01/21/2023  in [facility]  C-SSRS RISK CATEGORY No Risk No Risk No Risk    Collaboration of Care: Collaboration of Care: Medication Management AEB medication prescription, Primary Care Provider AEB chart review, and Other provider involved in patient's care AEB chart reviewed  Patient/Guardian was advised Release of Information must be obtained prior to any record release in order to collaborate their care with an outside provider. Patient/Guardian was advised if they have not already done so to contact the registration department to sign all necessary forms in order for us  to release information regarding their care.   Consent: Patient/Guardian gives verbal consent for treatment and assignment of benefits for services provided during this visit. Patient/Guardian expressed understanding and agreed to proceed.    Arvella CHRISTELLA Finder, MD 02/09/2024, 11:46 AM

## 2024-02-10 ENCOUNTER — Ambulatory Visit (HOSPITAL_COMMUNITY): Admitting: Psychiatry

## 2024-02-10 ENCOUNTER — Telehealth (HOSPITAL_COMMUNITY): Payer: Self-pay | Admitting: Psychiatry

## 2024-02-10 ENCOUNTER — Ambulatory Visit (HOSPITAL_COMMUNITY): Payer: MEDICAID

## 2024-02-10 ENCOUNTER — Encounter (HOSPITAL_COMMUNITY): Payer: Self-pay | Admitting: Psychiatry

## 2024-02-10 NOTE — Telephone Encounter (Signed)
 Patient called to cancel today's appointment due to not feeling well.  She did mention that she connected with the PCP and went to the ED/cardiologist in the interim.  While we are happy that she did leave the house to present to these providers for care we did review that this is her fourth no-show in a row with this provider.  We had reviewed previously that this was going to be her last opportunity to come to the appointment.  Patient will be dismissed from the practice.

## 2024-02-17 ENCOUNTER — Encounter (HOSPITAL_COMMUNITY): Payer: Self-pay

## 2024-02-17 ENCOUNTER — Inpatient Hospital Stay (HOSPITAL_COMMUNITY)
Admission: EM | Admit: 2024-02-17 | Discharge: 2024-03-03 | DRG: 286 | Disposition: A | Discharge status: Home Health | Payer: MEDICAID | Attending: Internal Medicine | Admitting: Internal Medicine

## 2024-02-17 ENCOUNTER — Emergency Department (HOSPITAL_COMMUNITY): Payer: MEDICAID

## 2024-02-17 ENCOUNTER — Other Ambulatory Visit: Payer: Self-pay

## 2024-02-17 DIAGNOSIS — Z7901 Long term (current) use of anticoagulants: Secondary | ICD-10-CM

## 2024-02-17 DIAGNOSIS — F332 Major depressive disorder, recurrent severe without psychotic features: Secondary | ICD-10-CM

## 2024-02-17 DIAGNOSIS — Z5982 Transportation insecurity: Secondary | ICD-10-CM

## 2024-02-17 DIAGNOSIS — R0602 Shortness of breath: Secondary | ICD-10-CM

## 2024-02-17 DIAGNOSIS — Q2112 Patent foramen ovale: Secondary | ICD-10-CM

## 2024-02-17 DIAGNOSIS — D509 Iron deficiency anemia, unspecified: Secondary | ICD-10-CM | POA: Diagnosis present

## 2024-02-17 DIAGNOSIS — I272 Pulmonary hypertension, unspecified: Secondary | ICD-10-CM | POA: Diagnosis present

## 2024-02-17 DIAGNOSIS — I130 Hypertensive heart and chronic kidney disease with heart failure and stage 1 through stage 4 chronic kidney disease, or unspecified chronic kidney disease: Principal | ICD-10-CM | POA: Diagnosis present

## 2024-02-17 DIAGNOSIS — N1832 Chronic kidney disease, stage 3b: Secondary | ICD-10-CM | POA: Diagnosis present

## 2024-02-17 DIAGNOSIS — I509 Heart failure, unspecified: Principal | ICD-10-CM

## 2024-02-17 DIAGNOSIS — Z62810 Personal history of physical and sexual abuse in childhood: Secondary | ICD-10-CM

## 2024-02-17 DIAGNOSIS — Z833 Family history of diabetes mellitus: Secondary | ICD-10-CM

## 2024-02-17 DIAGNOSIS — R7303 Prediabetes: Secondary | ICD-10-CM | POA: Diagnosis present

## 2024-02-17 DIAGNOSIS — E66813 Obesity, class 3: Secondary | ICD-10-CM | POA: Diagnosis present

## 2024-02-17 DIAGNOSIS — R9431 Abnormal electrocardiogram [ECG] [EKG]: Secondary | ICD-10-CM | POA: Insufficient documentation

## 2024-02-17 DIAGNOSIS — N179 Acute kidney failure, unspecified: Secondary | ICD-10-CM | POA: Diagnosis present

## 2024-02-17 DIAGNOSIS — Z79899 Other long term (current) drug therapy: Secondary | ICD-10-CM

## 2024-02-17 DIAGNOSIS — F329 Major depressive disorder, single episode, unspecified: Secondary | ICD-10-CM | POA: Diagnosis present

## 2024-02-17 DIAGNOSIS — Z8249 Family history of ischemic heart disease and other diseases of the circulatory system: Secondary | ICD-10-CM

## 2024-02-17 DIAGNOSIS — F4000 Agoraphobia, unspecified: Secondary | ICD-10-CM | POA: Diagnosis present

## 2024-02-17 DIAGNOSIS — I2510 Atherosclerotic heart disease of native coronary artery without angina pectoris: Secondary | ICD-10-CM | POA: Diagnosis present

## 2024-02-17 DIAGNOSIS — R079 Chest pain, unspecified: Secondary | ICD-10-CM

## 2024-02-17 DIAGNOSIS — Z6841 Body Mass Index (BMI) 40.0 and over, adult: Secondary | ICD-10-CM

## 2024-02-17 DIAGNOSIS — N289 Disorder of kidney and ureter, unspecified: Secondary | ICD-10-CM

## 2024-02-17 DIAGNOSIS — I428 Other cardiomyopathies: Secondary | ICD-10-CM | POA: Diagnosis present

## 2024-02-17 DIAGNOSIS — I4581 Long QT syndrome: Secondary | ICD-10-CM | POA: Diagnosis present

## 2024-02-17 DIAGNOSIS — F411 Generalized anxiety disorder: Secondary | ICD-10-CM | POA: Diagnosis present

## 2024-02-17 DIAGNOSIS — I5033 Acute on chronic diastolic (congestive) heart failure: Secondary | ICD-10-CM | POA: Diagnosis present

## 2024-02-17 DIAGNOSIS — I951 Orthostatic hypotension: Secondary | ICD-10-CM | POA: Diagnosis present

## 2024-02-17 DIAGNOSIS — F422 Mixed obsessional thoughts and acts: Secondary | ICD-10-CM

## 2024-02-17 DIAGNOSIS — I4892 Unspecified atrial flutter: Secondary | ICD-10-CM | POA: Diagnosis present

## 2024-02-17 DIAGNOSIS — I3139 Other pericardial effusion (noninflammatory): Secondary | ICD-10-CM | POA: Diagnosis present

## 2024-02-17 DIAGNOSIS — I5020 Unspecified systolic (congestive) heart failure: Secondary | ICD-10-CM

## 2024-02-17 DIAGNOSIS — Z882 Allergy status to sulfonamides status: Secondary | ICD-10-CM

## 2024-02-17 DIAGNOSIS — R7989 Other specified abnormal findings of blood chemistry: Secondary | ICD-10-CM

## 2024-02-17 DIAGNOSIS — I5031 Acute diastolic (congestive) heart failure: Secondary | ICD-10-CM | POA: Diagnosis present

## 2024-02-17 DIAGNOSIS — I4819 Other persistent atrial fibrillation: Secondary | ICD-10-CM | POA: Diagnosis present

## 2024-02-17 DIAGNOSIS — I5023 Acute on chronic systolic (congestive) heart failure: Secondary | ICD-10-CM | POA: Diagnosis present

## 2024-02-17 DIAGNOSIS — F32A Depression, unspecified: Secondary | ICD-10-CM | POA: Diagnosis present

## 2024-02-17 DIAGNOSIS — F429 Obsessive-compulsive disorder, unspecified: Secondary | ICD-10-CM | POA: Diagnosis present

## 2024-02-17 DIAGNOSIS — I160 Hypertensive urgency: Secondary | ICD-10-CM | POA: Diagnosis present

## 2024-02-17 DIAGNOSIS — Z91148 Patient's other noncompliance with medication regimen for other reason: Secondary | ICD-10-CM

## 2024-02-17 DIAGNOSIS — I10 Essential (primary) hypertension: Secondary | ICD-10-CM | POA: Diagnosis present

## 2024-02-17 DIAGNOSIS — R0789 Other chest pain: Secondary | ICD-10-CM

## 2024-02-17 DIAGNOSIS — I340 Nonrheumatic mitral (valve) insufficiency: Secondary | ICD-10-CM | POA: Diagnosis present

## 2024-02-17 LAB — BASIC METABOLIC PANEL WITH GFR
Anion gap: 10 (ref 5–15)
BUN: 29 mg/dL — ABNORMAL HIGH (ref 6–20)
CO2: 21 mmol/L — ABNORMAL LOW (ref 22–32)
Calcium: 9.1 mg/dL (ref 8.9–10.3)
Chloride: 110 mmol/L (ref 98–111)
Creatinine, Ser: 1.61 mg/dL — ABNORMAL HIGH (ref 0.44–1.00)
GFR, Estimated: 37 mL/min — ABNORMAL LOW (ref >60)
Glucose, Bld: 100 mg/dL — ABNORMAL HIGH (ref 70–99)
Potassium: 4.5 mmol/L (ref 3.5–5.1)
Sodium: 141 mmol/L (ref 135–145)

## 2024-02-17 LAB — HEPATIC FUNCTION PANEL
ALT: 24 U/L (ref 0–44)
AST: 23 U/L (ref 15–41)
Albumin: 3.3 g/dL — ABNORMAL LOW (ref 3.5–5.0)
Alkaline Phosphatase: 76 U/L (ref 38–126)
Bilirubin, Direct: 0.2 mg/dL (ref 0.0–0.2)
Indirect Bilirubin: 0.5 mg/dL (ref 0.3–0.9)
Total Bilirubin: 0.7 mg/dL (ref 0.0–1.2)
Total Protein: 6.4 g/dL — ABNORMAL LOW (ref 6.5–8.1)

## 2024-02-17 LAB — TROPONIN I (HIGH SENSITIVITY)
Troponin I (High Sensitivity): 26 ng/L — ABNORMAL HIGH (ref <18)
Troponin I (High Sensitivity): 29 ng/L — ABNORMAL HIGH (ref <18)

## 2024-02-17 LAB — CBC
HCT: 35.6 % — ABNORMAL LOW (ref 36.0–46.0)
Hemoglobin: 11.2 g/dL — ABNORMAL LOW (ref 12.0–15.0)
MCH: 24.9 pg — ABNORMAL LOW (ref 26.0–34.0)
MCHC: 31.5 g/dL (ref 30.0–36.0)
MCV: 79.3 fL — ABNORMAL LOW (ref 80.0–100.0)
Platelets: 235 K/uL (ref 150–400)
RBC: 4.49 MIL/uL (ref 3.87–5.11)
RDW: 18.5 % — ABNORMAL HIGH (ref 11.5–15.5)
WBC: 6.1 K/uL (ref 4.0–10.5)
nRBC: 0 % (ref 0.0–0.2)

## 2024-02-17 LAB — BRAIN NATRIURETIC PEPTIDE: B Natriuretic Peptide: 856.6 pg/mL — ABNORMAL HIGH (ref 0.0–100.0)

## 2024-02-17 NOTE — ED Triage Notes (Signed)
 Pt c/o chest pain, shortness of breath and abdominal pain for past several months. Pt does have nausea. Pt states she was seen for similar symptoms last month.

## 2024-02-17 NOTE — ED Provider Triage Note (Signed)
 Emergency Medicine Provider Triage Evaluation Note  Abigail Sharp , a 58 y.o. female  was evaluated in triage.  Pt complains of this pain noting worse with exertion even with short distance.  He is established with heart doctor.  Review of Systems  Positive: CP, SOB Negative: NVD  Physical Exam  BP (!) 167/110 (BP Location: Right Arm)   Pulse (!) 102   Temp 98 F (36.7 C)   Resp 17   Ht 5' 4 (1.626 m)   Wt 125.6 kg   LMP 03/29/2016   SpO2 90%   BMI 47.55 kg/m  Gen:   Awake, no distress   Resp:  Normal effort  MSK:   Moves extremities without difficulty  Other:    Medical Decision Making  Medically screening exam initiated at 3:26 PM.  Appropriate orders placed.  Abigail Sharp was informed that the remainder of the evaluation will be completed by another provider, this initial triage assessment does not replace that evaluation, and the importance of remaining in the ED until their evaluation is complete.     Shermon Warren SAILOR, PA-C 02/17/24 1526

## 2024-02-18 ENCOUNTER — Ambulatory Visit: Payer: MEDICAID | Admitting: Physician Assistant

## 2024-02-18 DIAGNOSIS — I3139 Other pericardial effusion (noninflammatory): Secondary | ICD-10-CM | POA: Diagnosis present

## 2024-02-18 DIAGNOSIS — I509 Heart failure, unspecified: Secondary | ICD-10-CM | POA: Diagnosis not present

## 2024-02-18 DIAGNOSIS — Z7901 Long term (current) use of anticoagulants: Secondary | ICD-10-CM | POA: Diagnosis not present

## 2024-02-18 DIAGNOSIS — Z79899 Other long term (current) drug therapy: Secondary | ICD-10-CM | POA: Diagnosis not present

## 2024-02-18 DIAGNOSIS — I5031 Acute diastolic (congestive) heart failure: Secondary | ICD-10-CM | POA: Diagnosis not present

## 2024-02-18 DIAGNOSIS — R0789 Other chest pain: Secondary | ICD-10-CM | POA: Diagnosis not present

## 2024-02-18 DIAGNOSIS — D509 Iron deficiency anemia, unspecified: Secondary | ICD-10-CM | POA: Diagnosis present

## 2024-02-18 DIAGNOSIS — Q2112 Patent foramen ovale: Secondary | ICD-10-CM | POA: Diagnosis not present

## 2024-02-18 DIAGNOSIS — F411 Generalized anxiety disorder: Secondary | ICD-10-CM | POA: Diagnosis present

## 2024-02-18 DIAGNOSIS — I5033 Acute on chronic diastolic (congestive) heart failure: Secondary | ICD-10-CM | POA: Diagnosis present

## 2024-02-18 DIAGNOSIS — R0602 Shortness of breath: Secondary | ICD-10-CM | POA: Diagnosis present

## 2024-02-18 DIAGNOSIS — I160 Hypertensive urgency: Secondary | ICD-10-CM | POA: Diagnosis present

## 2024-02-18 DIAGNOSIS — I5041 Acute combined systolic (congestive) and diastolic (congestive) heart failure: Secondary | ICD-10-CM | POA: Diagnosis not present

## 2024-02-18 DIAGNOSIS — I110 Hypertensive heart disease with heart failure: Secondary | ICD-10-CM | POA: Diagnosis not present

## 2024-02-18 DIAGNOSIS — I130 Hypertensive heart and chronic kidney disease with heart failure and stage 1 through stage 4 chronic kidney disease, or unspecified chronic kidney disease: Secondary | ICD-10-CM | POA: Diagnosis present

## 2024-02-18 DIAGNOSIS — I4819 Other persistent atrial fibrillation: Secondary | ICD-10-CM | POA: Diagnosis present

## 2024-02-18 DIAGNOSIS — F418 Other specified anxiety disorders: Secondary | ICD-10-CM | POA: Diagnosis not present

## 2024-02-18 DIAGNOSIS — Z6841 Body Mass Index (BMI) 40.0 and over, adult: Secondary | ICD-10-CM | POA: Diagnosis not present

## 2024-02-18 DIAGNOSIS — I342 Nonrheumatic mitral (valve) stenosis: Secondary | ICD-10-CM | POA: Diagnosis not present

## 2024-02-18 DIAGNOSIS — I951 Orthostatic hypotension: Secondary | ICD-10-CM | POA: Diagnosis present

## 2024-02-18 DIAGNOSIS — E66813 Obesity, class 3: Secondary | ICD-10-CM | POA: Diagnosis present

## 2024-02-18 DIAGNOSIS — I4581 Long QT syndrome: Secondary | ICD-10-CM | POA: Diagnosis not present

## 2024-02-18 DIAGNOSIS — F329 Major depressive disorder, single episode, unspecified: Secondary | ICD-10-CM | POA: Diagnosis present

## 2024-02-18 DIAGNOSIS — Z833 Family history of diabetes mellitus: Secondary | ICD-10-CM | POA: Diagnosis not present

## 2024-02-18 DIAGNOSIS — N179 Acute kidney failure, unspecified: Secondary | ICD-10-CM | POA: Diagnosis present

## 2024-02-18 DIAGNOSIS — I340 Nonrheumatic mitral (valve) insufficiency: Secondary | ICD-10-CM | POA: Diagnosis present

## 2024-02-18 DIAGNOSIS — I4892 Unspecified atrial flutter: Secondary | ICD-10-CM | POA: Diagnosis present

## 2024-02-18 DIAGNOSIS — R7989 Other specified abnormal findings of blood chemistry: Secondary | ICD-10-CM | POA: Diagnosis not present

## 2024-02-18 DIAGNOSIS — N1832 Chronic kidney disease, stage 3b: Secondary | ICD-10-CM | POA: Diagnosis present

## 2024-02-18 DIAGNOSIS — I5023 Acute on chronic systolic (congestive) heart failure: Secondary | ICD-10-CM | POA: Diagnosis not present

## 2024-02-18 DIAGNOSIS — N189 Chronic kidney disease, unspecified: Secondary | ICD-10-CM | POA: Diagnosis not present

## 2024-02-18 DIAGNOSIS — I2510 Atherosclerotic heart disease of native coronary artery without angina pectoris: Secondary | ICD-10-CM | POA: Diagnosis present

## 2024-02-18 DIAGNOSIS — I428 Other cardiomyopathies: Secondary | ICD-10-CM | POA: Diagnosis present

## 2024-02-18 DIAGNOSIS — I272 Pulmonary hypertension, unspecified: Secondary | ICD-10-CM | POA: Diagnosis present

## 2024-02-18 DIAGNOSIS — Z8249 Family history of ischemic heart disease and other diseases of the circulatory system: Secondary | ICD-10-CM | POA: Diagnosis not present

## 2024-02-18 DIAGNOSIS — I10 Essential (primary) hypertension: Secondary | ICD-10-CM | POA: Diagnosis not present

## 2024-02-18 LAB — URINALYSIS, W/ REFLEX TO CULTURE (INFECTION SUSPECTED)
Bilirubin Urine: NEGATIVE
Glucose, UA: NEGATIVE mg/dL
Hgb urine dipstick: NEGATIVE
Ketones, ur: NEGATIVE mg/dL
Leukocytes,Ua: NEGATIVE
Nitrite: NEGATIVE
Protein, ur: 30 mg/dL — AB
Specific Gravity, Urine: 1.012 (ref 1.005–1.030)
pH: 6 (ref 5.0–8.0)

## 2024-02-18 LAB — TROPONIN I (HIGH SENSITIVITY)
Troponin I (High Sensitivity): 21 ng/L — ABNORMAL HIGH (ref <18)
Troponin I (High Sensitivity): 19 ng/L — ABNORMAL HIGH (ref <18)

## 2024-02-18 LAB — POC OCCULT BLOOD, ED: Fecal Occult Bld: NEGATIVE

## 2024-02-18 LAB — HEMOGLOBIN A1C
Hgb A1c MFr Bld: 5.3 % (ref 4.8–5.6)
Mean Plasma Glucose: 105.41 mg/dL

## 2024-02-18 LAB — HIV ANTIBODY (ROUTINE TESTING W REFLEX): HIV Screen 4th Generation wRfx: NONREACTIVE

## 2024-02-18 MED ORDER — ASPIRIN 81 MG PO CHEW
324.0000 mg | CHEWABLE_TABLET | Freq: Once | ORAL | Status: AC
  Administered 2024-02-18: 324 mg via ORAL
  Filled 2024-02-18: qty 4

## 2024-02-18 MED ORDER — ACETAMINOPHEN 650 MG RE SUPP: 650.0000 mg | Freq: Four times a day (QID) | RECTAL | Status: DC | PRN

## 2024-02-18 MED ORDER — CARVEDILOL 12.5 MG PO TABS
12.5000 mg | ORAL_TABLET | Freq: Two times a day (BID) | ORAL | Status: DC
  Administered 2024-02-18 (×2): 12.5 mg via ORAL
  Filled 2024-02-18 (×3): qty 1

## 2024-02-18 MED ORDER — MORPHINE SULFATE (PF) 4 MG/ML IV SOLN
4.0000 mg | Freq: Once | INTRAVENOUS | Status: AC
  Administered 2024-02-18: 4 mg via INTRAVENOUS
  Filled 2024-02-18: qty 1

## 2024-02-18 MED ORDER — SERTRALINE HCL 100 MG PO TABS
200.0000 mg | ORAL_TABLET | Freq: Every day | ORAL | Status: DC
Start: 2024-02-18 — End: 2024-02-19
  Administered 2024-02-18 – 2024-02-19 (×2): 200 mg via ORAL
  Filled 2024-02-18 (×2): qty 2

## 2024-02-18 MED ORDER — FLUTICASONE PROPIONATE 50 MCG/ACT NA SUSP
2.0000 | Freq: Every day | NASAL | Status: DC
  Administered 2024-02-18 – 2024-02-28 (×8): 2 via NASAL
  Filled 2024-02-18: qty 16

## 2024-02-18 MED ORDER — HYDRALAZINE HCL 20 MG/ML IJ SOLN: 10.0000 mg | Freq: Four times a day (QID) | INTRAMUSCULAR | Status: DC | PRN

## 2024-02-18 MED ORDER — SODIUM CHLORIDE 0.9% FLUSH
3.0000 mL | Freq: Two times a day (BID) | INTRAVENOUS | Status: DC
  Administered 2024-02-18 – 2024-02-26 (×15): 3 mL via INTRAVENOUS

## 2024-02-18 MED ORDER — AMLODIPINE BESYLATE 5 MG PO TABS
5.0000 mg | ORAL_TABLET | Freq: Every day | ORAL | Status: DC
  Administered 2024-02-18: 5 mg via ORAL
  Filled 2024-02-18 (×3): qty 1

## 2024-02-18 MED ORDER — FUROSEMIDE 10 MG/ML IJ SOLN
40.0000 mg | Freq: Once | INTRAMUSCULAR | Status: AC
  Administered 2024-02-18: 40 mg via INTRAVENOUS
  Filled 2024-02-18: qty 4

## 2024-02-18 MED ORDER — LAMOTRIGINE 25 MG PO TABS
25.0000 mg | ORAL_TABLET | Freq: Two times a day (BID) | ORAL | Status: DC
  Administered 2024-02-18 – 2024-03-03 (×29): 25 mg via ORAL
  Filled 2024-02-18 (×32): qty 1

## 2024-02-18 MED ORDER — BUSPIRONE HCL 5 MG PO TABS
10.0000 mg | ORAL_TABLET | Freq: Two times a day (BID) | ORAL | Status: DC
  Administered 2024-02-18 – 2024-03-03 (×29): 10 mg via ORAL
  Filled 2024-02-18 (×30): qty 2

## 2024-02-18 MED ORDER — ARIPIPRAZOLE 5 MG PO TABS
5.0000 mg | ORAL_TABLET | Freq: Every day | ORAL | Status: DC
  Administered 2024-02-18 – 2024-03-03 (×15): 5 mg via ORAL
  Filled 2024-02-18 (×16): qty 1

## 2024-02-18 MED ORDER — FUROSEMIDE 10 MG/ML IJ SOLN
40.0000 mg | Freq: Two times a day (BID) | INTRAMUSCULAR | Status: DC
  Administered 2024-02-18 – 2024-02-19 (×3): 40 mg via INTRAVENOUS
  Filled 2024-02-18 (×3): qty 4

## 2024-02-18 MED ORDER — ACETAMINOPHEN 325 MG PO TABS
650.0000 mg | ORAL_TABLET | Freq: Four times a day (QID) | ORAL | Status: DC | PRN
  Administered 2024-02-18 – 2024-02-22 (×7): 650 mg via ORAL
  Filled 2024-02-18 (×7): qty 2

## 2024-02-18 MED ORDER — ENOXAPARIN SODIUM 40 MG/0.4ML IJ SOSY
40.0000 mg | PREFILLED_SYRINGE | INTRAMUSCULAR | Status: DC
  Administered 2024-02-18 – 2024-02-19 (×2): 40 mg via SUBCUTANEOUS
  Filled 2024-02-18 (×2): qty 0.4

## 2024-02-18 NOTE — H&P (Signed)
 History and Physical    Patient: Abigail Sharp DOB: Aug 15, 1965 DOA: 02/17/2024 DOS: the patient was seen and examined on 02/18/2024 PCP: Pcp, No  Patient coming from: Home  Chief Complaint:  Chief Complaint  Patient presents with   Shortness of Breath   HPI: Abigail Sharp is a 58 y.o. female with medical history significant of hypertension, HFpEF due to nonischemic cardiomyopathy, anxiety and OCD, anemia and obesity BMI 47.5.  She presented to the ED on 9/16 complaining of chest pain and shortness of breath as well as abdominal pain for several months.  She reports nausea.  States she was seen in the ED last month for similar symptoms.  Upon review of patient's home medications she admitted to not taking any of her medications including both cardiac and psychotropic medications.  In the ED she had markedly uncontrolled hypertension with BP readings 160s over 110s, while sleeping she did require nasal cannula 2 L/min, she was afebrile.  Her BNP was elevated at 856, she had mildly elevated troponin at 18, 26 and 29.  Creatinine was slightly elevated above baseline.  Chest x-ray revealed mild cardiomegaly with vascular congestion and possible tiny pleural effusions.  Hospitalist was consulted to evaluate the patient for admission  Upon my evaluation of the patient she confirms she had not been taking her medications for 3 months.  She attributes this to multiple factors including her underlying agoraphobia as well as issues related to her insurance but she states she now has Medicaid.  Apparently she had attempted to utilize a mail service for prescriptions but decided she wanted to get meds earlier so she was able to pick up some medications from the pharmacy but had not yet started them.  Currently she denies chest pain or shortness of breath at rest  Review of Systems: As mentioned in the history of present illness. All other systems reviewed and are negative.   Past Medical History:   Diagnosis Date   Anxiety    Chronic systolic heart failure (HCC)    Depression    HTN (hypertension)    Microcytic anemia    NICM (nonischemic cardiomyopathy) (HCC) 11/2014   EF is 25% improved to 55% in 2015.   Pre-diabetes    Past Surgical History:  Procedure Laterality Date   CARDIAC CATHETERIZATION N/A 11/07/2014   Procedure: Right/Left Heart Cath and Coronary Angiography;  Surgeon: Dorn JINNY Lesches, MD;  Location: [facility] INVASIVE CV LAB;  Service: Cardiovascular;  Laterality: N/A;   TONSILLECTOMY     TUBAL LIGATION     Social History:  reports that she has never smoked. She has never used smokeless tobacco. She reports that she does not drink alcohol and does not use drugs.  Allergies  Allergen Reactions   Sulfa Antibiotics Other (See Comments)    Childhood allergy with unknown reaction    Family History  Problem Relation Age of Onset   Diabetes Mother    Hypertension Mother    Diabetes Father    Heart disease Father    Hypertension Father    Diabetes Sister    ADD / ADHD Son    Alcohol abuse Neg Hx    Anxiety disorder Neg Hx    Bipolar disorder Neg Hx    Depression Neg Hx    Drug abuse Neg Hx     Prior to Admission medications   Medication Sig Start Date End Date Taking? Authorizing Provider  ARIPiprazole  (ABILIFY ) 5 MG tablet Take 1 tablet (5 mg total) by  mouth daily. Patient not taking: Reported on 02/18/2024 09/18/23   Carvin Arvella HERO, MD  aspirin  81 MG EC tablet Take 1 tablet (81 mg total) by mouth daily. Patient not taking: Reported on 02/18/2024 04/17/16   Marilyne Morrison T, MD  busPIRone  (BUSPAR ) 10 MG tablet Take 1 tablet (10 mg total) by mouth 2 (two) times daily. Patient not taking: Reported on 02/18/2024 07/17/23   Carvin Arvella HERO, MD  carvedilol  (COREG ) 6.25 MG tablet Take 1 tablet (6.25 mg total) by mouth 2 (two) times daily with a meal. Patient not taking: Reported on 02/18/2024 01/05/24   Tolia, Sunit, DO  cephALEXin  (KEFLEX ) 500 MG capsule Take 1 capsule  (500 mg total) by mouth 3 (three) times daily. Patient not taking: Reported on 02/18/2024 01/03/24   Horton, Charmaine FALCON, MD  lamoTRIgine  (LAMICTAL ) 25 MG tablet Take 1 tablet (25 mg total) by mouth 2 (two) times daily. Patient not taking: Reported on 02/18/2024 07/17/23 02/17/25  Carvin Arvella HERO, MD  sertraline  (ZOLOFT ) 100 MG tablet Take 2 tablets (200 mg total) by mouth daily. Patient not taking: Reported on 02/18/2024 07/17/23 07/16/24  Carvin Arvella HERO, MD  spironolactone  (ALDACTONE ) 25 MG tablet Take 1 tablet (25 mg total) by mouth daily. Patient not taking: Reported on 02/18/2024 01/05/24   Michele Richardson, DO    Physical Exam: Vitals:   02/18/24 0614 02/18/24 0615 02/18/24 0945 02/18/24 1018  BP:  (!) 162/111 131/71   Pulse:  81 79   Resp:  (!) 24 (!) 21   Temp: (!) 97.5 F (36.4 C)   97.7 F (36.5 C)  TempSrc: Oral   Oral  SpO2:  100% 100%   Weight:      Height:       Constitutional: NAD, calm, comfortable Respiratory: coarse to auscultation bilaterally, no wheezing, no crackles. Normal respiratory effort. No accessory muscle use.  Cardiovascular: Regular rate and rhythm, no murmurs / rubs / gallops. No extremity edema. 2+ pedal pulses. No carotid bruits.  Abdomen: no tenderness, no masses palpated. No hepatosplenomegaly. Bowel sounds positive.  Musculoskeletal: no clubbing / cyanosis. No joint deformity upper and lower extremities. Good ROM, no contractures. Normal muscle tone.  Skin: no rashes, lesions, ulcers. No induration Neurologic: CN 2-12 grossly intact. Sensation intact, DTR normal. Strength 5/5 x all 4 extremities.  Psychiatric: Normal judgment and insight. Alert and oriented x 3. Normal mood.    Data Reviewed:  Sodium 141, potassium 4.5, CO2 21, glucose 100, BUN 29, creatinine 1.61, GFR 37  BNP 856, troponin 26 and 29  WBC 6100 differential not obtained, hemoglobin 11.2, platelets 235,000  Urinalysis unremarkable except for rare bacteria and 30 of protein  Assessment and  Plan: Acute exacerbation of HFpEF/NICM Presented with reports of chest pain and shortness of breath Etiology likely secondary to medication nonadherence noting patient had missed medications for the past 3 months Blood pressure markedly elevated in the ED and with administration of antihypertensive medications after arrival BP has returned to normal range BNP elevated at 856 Chest x-ray demonstrated vascular congestion without edema Continue Lasix  40 mg IV every 12 hours-was prescribed spironolactone  prior to admission but was not taking. Echocardiogram  Hypertensive urgency Initial blood pressure readings in the 160s over 1 teens and have normalized after introduction of Norvasc  and carvedilol  as well as after administration of IV Lasix  IV Apresoline  has also been ordered with as needed parameters  Mild acute kidney injury Baseline creatinine appears to be demonstrating upward trend In 2019 creatinine was  1.08 2021 1.28 August of this year 2.18 with current creatinine 1.61 Will need to obtain urinary creatinine-concern is that multiple years of noncompliance with medications especially antihypertensive medications could be contributing to potential renal disease Urinalysis with 30 of protein  Elevated troponin Mild trend that is flat is consistent with demand ischemia EKG unremarkable  Anxiety and OCD Patient reported that part of the reason she was nonadherent to her medications with because of agoraphobia and did not wish to leave her home to obtain medication Apparently she has a process in place to have medications mailed to her home now Continue preadmission Abilify , BuSpar , Zoloft  and Lamictal   Obesity BMI 47.5 Counseled regarding weight reduction    Advance Care Planning:   Code Status: Full Code   VTE prophylaxis: Lovenox   Consults: None  Family Communication: Patient only  Severity of Illness: The appropriate patient status for this patient is OBSERVATION.  Observation status is judged to be reasonable and necessary in order to provide the required intensity of service to ensure the patient's safety. The patient's presenting symptoms, physical exam findings, and initial radiographic and laboratory data in the context of their medical condition is felt to place them at decreased risk for further clinical deterioration. Furthermore, it is anticipated that the patient will be medically stable for discharge from the hospital within 2 midnights of admission.   Author: Isaiah Lever, NP 02/18/2024 11:07 AM  For on call review [email or web address].

## 2024-02-18 NOTE — ED Notes (Signed)
 CCMD called to place the patient on cardiac monitoring services.

## 2024-02-18 NOTE — Hospital Course (Addendum)
 [name] was admitted to the hospital with the working diagnosis of heart failure exacerbation.   58 year old with a history of HTN and CHF who presented to the with chest pain and dyspnea.  Reported several moths of worsening dyspnea and abdominal distention. Apparently she was not compliant with her medications at home.  On her initial physical examination her blood pressure was 160/110 mmHg, HR 81, RR 24 and 02 saturation 100%  Lungs with coarse breath sounds bilaterally, no wheezing, heart with S1 and S2 present and regular with no gallops or rubs, abdomen soft and non tender, trace lower extremity edema.   Na 141, K 4.5 Cl 110 bicarbonate 21, glucose 100 bun 28 cr 1,61  AST 23 ALT 24  BNP 856  High sensitive troponin 26 and 29  Wbc 6.1 hgb 11.2 plt 235   Urine analysis SG 1,012, protein 30, leukocytes negative, hgb negative   Chest radiograph with hypoinflation, positive cardiomegaly, with bilateral hilar vascular congestion, bilateral small pleural effusions.   EKG 98 bpm, right axis deviation, qtc 528, sinus rhythm with bilateral atrial enlargement, poor RR wave progression, no significant ST segment or  T wave changes.   Patient was placed on furosemide  for diuresis.    Echocardiogram with EF of 40-45% with moderate to severe MR.   09/22 TEE LV EF 35 to 40%, severe mitral valve regurgitation.   09/24 Left and right heart cath noted nonobstructive coronary artery disease and significantly elevated right and left heart filling pressure. Placed on guideline directed medical therapy for heart failure, including digoxin .  09/26 direct current cardioversion, converting to sinus rhythm.  10/01 improved volume status, plan for outpatient follow up.

## 2024-02-18 NOTE — ED Notes (Signed)
 CCMD called to report transfer to RM 4E12.

## 2024-02-18 NOTE — Plan of Care (Signed)

## 2024-02-18 NOTE — ED Provider Notes (Signed)
 [place] EMERGENCY DEPARTMENT AT [facility] Provider Note   CSN: [id number] Arrival date & time: 02/17/24  1439     Patient presents with: Shortness of Breath   Abigail Sharp is a 58 y.o. female.   The history is provided by the patient.  Shortness of Breath  She has history of hypertension, systolic heart failure nonischemic cardiomyopathy, generalized anxiety disorder and comes in complaining of ongoing chest pain and shortness of breath.  She is emotionally labile and crying during most of the interview.  She complains of pain in her chest which goes into her back and abdomen all the way down her legs.  This has been present for months if not years, but it is getting worse.  She also endorses shortness of breath with orthopnea.  She denies paroxysmal nocturnal dyspnea but has been awakened at night because of her chest pain.  She had been seen in the emergency department 7 weeks ago for similar complaints and was found to have a UTI and given a prescription for cephalexin .  She states none of her symptoms have improved since then.  She does endorse a nonproductive cough.  She denies fever but has had chills and sweats.  She has had nausea but no vomiting.  All symptoms are worse with any exertion.    Prior to Admission medications   Medication Sig Start Date End Date Taking? Authorizing Provider  ARIPiprazole  (ABILIFY ) 5 MG tablet Take 1 tablet (5 mg total) by mouth daily. 09/18/23   Carvin Arvella HERO, MD  aspirin  81 MG EC tablet Take 1 tablet (81 mg total) by mouth daily. 04/17/16   Marilyne Stephane DASEN, MD  busPIRone  (BUSPAR ) 10 MG tablet Take 1 tablet (10 mg total) by mouth 2 (two) times daily. 07/17/23   Carvin Arvella HERO, MD  carvedilol  (COREG ) 6.25 MG tablet Take 1 tablet (6.25 mg total) by mouth 2 (two) times daily with a meal. 01/05/24   Tolia, Sunit, DO  cephALEXin  (KEFLEX ) 500 MG capsule Take 1 capsule (500 mg total) by mouth 3 (three) times daily. 01/03/24   Horton, Charmaine FALCON, MD  lamoTRIgine  (LAMICTAL ) 25 MG tablet Take 1 tablet (25 mg total) by mouth 2 (two) times daily. 07/17/23 01/02/24  Carvin Arvella HERO, MD  sertraline  (ZOLOFT ) 100 MG tablet Take 2 tablets (200 mg total) by mouth daily. 07/17/23 07/16/24  Carvin Arvella HERO, MD  spironolactone  (ALDACTONE ) 25 MG tablet Take 1 tablet (25 mg total) by mouth daily. 01/05/24   Tolia, Sunit, DO    Allergies: Sulfa antibiotics    Review of Systems  Respiratory:  Positive for shortness of breath.   All other systems reviewed and are negative.   Updated Vital Signs BP (!) 168/123 (BP Location: Right Arm)   Pulse 95   Temp 98 F (36.7 C)   Resp (!) 22   Ht 5' 4 (1.626 m)   Wt 125.6 kg   LMP 03/29/2016   SpO2 98%   BMI 47.55 kg/m   Physical Exam Vitals and nursing note reviewed.   58 year old female, crying, but is in no acute distress. Vital signs are significant for elevated blood pressure and slightly elevated respiratory rate. Oxygen saturation is 98%, which is normal. Head is normocephalic and atraumatic. PERRLA, EOMI.  Neck is nontender and supple without adenopathy.  JVD is present at 90 degrees. Back is nontender and there is no CVA tenderness.  There is no presacral edema. Lungs have minimal bibasilar rales without  wheezes or rhonchi. Chest is nontender. Heart has regular rate and rhythm without murmur. Abdomen is soft, flat, nontender. Rectal: Decreased sphincter tone, small amount of light brown stool which is Hemoccult negative. Extremities have 1+ edema, full range of motion is present. Skin is warm and dry without rash. Neurologic: Awake and alert but emotionally labile, cranial nerves are intact, moves all extremities equally.  (all labs ordered are listed, but only abnormal results are displayed) Labs Reviewed  BASIC METABOLIC PANEL WITH GFR - Abnormal; Notable for the following components:      Result Value   CO2 21 (*)    Glucose, Bld 100 (*)    BUN 29 (*)    Creatinine, Ser 1.61 (*)     GFR, Estimated 37 (*)    All other components within normal limits  CBC - Abnormal; Notable for the following components:   Hemoglobin 11.2 (*)    HCT 35.6 (*)    MCV 79.3 (*)    MCH 24.9 (*)    RDW 18.5 (*)    All other components within normal limits  BRAIN NATRIURETIC PEPTIDE - Abnormal; Notable for the following components:   B Natriuretic Peptide 856.6 (*)    All other components within normal limits  HEPATIC FUNCTION PANEL - Abnormal; Notable for the following components:   Total Protein 6.4 (*)    Albumin 3.3 (*)    All other components within normal limits  TROPONIN I (HIGH SENSITIVITY) - Abnormal; Notable for the following components:   Troponin I (High Sensitivity) 26 (*)    All other components within normal limits  TROPONIN I (HIGH SENSITIVITY) - Abnormal; Notable for the following components:   Troponin I (High Sensitivity) 29 (*)    All other components within normal limits  URINALYSIS, W/ REFLEX TO CULTURE (INFECTION SUSPECTED)  POC OCCULT BLOOD, ED    EKG: EKG Interpretation Date/Time:  Tuesday February 17 2024 14:51:13 EDT Ventricular Rate:  98 PR Interval:  162 QRS Duration:  96 QT Interval:  414 QTC Calculation: 528 R Axis:   94  Text Interpretation: Normal sinus rhythm Possible Left atrial enlargement Rightward axis Prolonged QT Abnormal ECG Low voltage QRS When compared with ECG of 02-Jan-2024 14:59, No significant change was found Confirmed by Raford Lenis ([id number]) on 02/18/2024 1:29:27 AM  Radiology: [facility] Chest 2 View Result Date: 02/17/2024 CLINICAL DATA:  Shortness of breath.  Chest pain. EXAM: CHEST - 2 VIEW COMPARISON:  01/02/2024 FINDINGS: Mild cardiomegaly. Vascular congestion without edema. Possible tiny pleural effusions. No confluent opacity. No pneumothorax. On limited assessment, no acute osseous findings. IMPRESSION: Mild cardiomegaly with vascular congestion. Possible tiny pleural effusions. Electronically Signed   By: Andrea Gasman M.D.    On: 02/17/2024 16:04     Procedures  Cardiac monitor shows normal sinus rhythm, per my interpretation. Medications Ordered in the ED  morphine  (PF) 4 MG/ML injection 4 mg (has no administration in time range)  furosemide  (LASIX ) injection 40 mg (has no administration in time range)                HEART Score: 2                    Medical Decision Making Amount and/or Complexity of Data Reviewed Labs: ordered. Radiology: ordered.  Risk OTC drugs. Prescription drug management.   Chronic chest pain and dyspnea in patient with known history of nonischemic cardiomyopathy.  This is a presentation with a wide range of treatment options  and carries with it a high risk of morbidity and complications.  Differential diagnosis includes, but is not limited to, acute coronary syndrome, pulmonary embolism, pleurisy, chest wall pain, GERD, heart failure exacerbation.  I have reviewed her electrocardiogram, and my interpretation is low voltage and otherwise unremarkable ECG and unchanged from prior.  Chest x-ray shows cardiomegaly and pulmonary vascular congestion.  I independently viewed the images, and agree with the radiologist's interpretation.  I have reviewed her laboratory tests, and my interpretation is renal insufficiency which is improved compared with 01/02/2024, mildly elevated troponin which is stable and likely represents demand ischemia, elevated BNP significantly higher than on 01/02/2124, microcytic anemia which is new compared with 01/02/2024 with 2.8 g drop in hemoglobin.  I have reviewed her past records, and note ED visit on 01/02/2024 at which time patient was complaining of chest pain and shortness of breath and had extensive workup including normal D-dimer and was found to be able to ambulate without oxygen desaturation.  Patient was advised of this and she states that the providers were not listening to her at that time.  She had cardiac catheterization on 11/07/2014 showing normal coronary  arteries but dilated cardiomyopathy.  Echocardiogram on 12/15/2020 showed left ventricular ejection fraction 60-65% and normal left ventricular diastolic parameters.  Today, she shows clear signs of heart failure with elevated BNP, neck vein distention, peripheral edema.  While I do not feel her chest pain is cardiac, I do feel that she needs admission for diuresis and further evaluation.  Heart score is 2 which puts her at low risk for major adverse cardiac events in the next 6 weeks.  I have ordered morphine  for pain, aspirin , and intravenous furosemide .  I have discussed the case with Dr. Alfornia of [facility], who agrees to admit the patient.     Final diagnoses:  Acute exacerbation of chronic heart failure (HCC)  Nonspecific chest pain  Microcytic anemia  Renal insufficiency  Elevated troponin    ED Discharge Orders     None          Raford Lenis, MD 02/18/24 0214

## 2024-02-19 ENCOUNTER — Inpatient Hospital Stay (HOSPITAL_COMMUNITY): Payer: MEDICAID

## 2024-02-19 ENCOUNTER — Other Ambulatory Visit: Payer: Self-pay | Admitting: Student

## 2024-02-19 DIAGNOSIS — I5023 Acute on chronic systolic (congestive) heart failure: Secondary | ICD-10-CM

## 2024-02-19 DIAGNOSIS — I5031 Acute diastolic (congestive) heart failure: Secondary | ICD-10-CM | POA: Diagnosis not present

## 2024-02-19 DIAGNOSIS — I4892 Unspecified atrial flutter: Secondary | ICD-10-CM | POA: Diagnosis not present

## 2024-02-19 DIAGNOSIS — R7989 Other specified abnormal findings of blood chemistry: Secondary | ICD-10-CM | POA: Diagnosis not present

## 2024-02-19 DIAGNOSIS — N179 Acute kidney failure, unspecified: Secondary | ICD-10-CM

## 2024-02-19 DIAGNOSIS — I10 Essential (primary) hypertension: Secondary | ICD-10-CM

## 2024-02-19 DIAGNOSIS — I509 Heart failure, unspecified: Secondary | ICD-10-CM

## 2024-02-19 DIAGNOSIS — N189 Chronic kidney disease, unspecified: Secondary | ICD-10-CM

## 2024-02-19 DIAGNOSIS — I4581 Long QT syndrome: Secondary | ICD-10-CM

## 2024-02-19 LAB — LIPID PANEL
Cholesterol: 151 mg/dL (ref 0–200)
HDL: 32 mg/dL — ABNORMAL LOW (ref >40)
LDL Cholesterol: 98 mg/dL (ref 0–99)
Total CHOL/HDL Ratio: 4.7 ratio
Triglycerides: 104 mg/dL (ref <150)
VLDL: 21 mg/dL (ref 0–40)

## 2024-02-19 LAB — BASIC METABOLIC PANEL WITH GFR
Anion gap: 12 (ref 5–15)
BUN: 30 mg/dL — ABNORMAL HIGH (ref 6–20)
CO2: 26 mmol/L (ref 22–32)
Calcium: 8.6 mg/dL — ABNORMAL LOW (ref 8.9–10.3)
Chloride: 100 mmol/L (ref 98–111)
Creatinine, Ser: 1.83 mg/dL — ABNORMAL HIGH (ref 0.44–1.00)
GFR, Estimated: 32 mL/min — ABNORMAL LOW (ref >60)
Glucose, Bld: 97 mg/dL (ref 70–99)
Potassium: 4.4 mmol/L (ref 3.5–5.1)
Sodium: 138 mmol/L (ref 135–145)

## 2024-02-19 LAB — TSH: TSH: 1.22 u[IU]/mL (ref 0.350–4.500)

## 2024-02-19 MED ORDER — SERTRALINE HCL 100 MG PO TABS
100.0000 mg | ORAL_TABLET | Freq: Every day | ORAL | Status: DC
Start: 2024-02-20 — End: 2024-02-20
  Administered 2024-02-20: 100 mg via ORAL
  Filled 2024-02-19: qty 1

## 2024-02-19 MED ORDER — FUROSEMIDE 10 MG/ML IJ SOLN
80.0000 mg | Freq: Two times a day (BID) | INTRAMUSCULAR | Status: DC
  Administered 2024-02-19 – 2024-02-20 (×2): 80 mg via INTRAVENOUS
  Filled 2024-02-19 (×2): qty 8

## 2024-02-19 MED ORDER — CARVEDILOL 6.25 MG PO TABS
6.2500 mg | ORAL_TABLET | Freq: Two times a day (BID) | ORAL | Status: DC
  Administered 2024-02-19 – 2024-02-20 (×2): 6.25 mg via ORAL
  Filled 2024-02-19 (×2): qty 1

## 2024-02-19 MED ORDER — HEPARIN (PORCINE) 25000 UT/250ML-% IV SOLN
1100.0000 [IU]/h | INTRAVENOUS | Status: AC
  Administered 2024-02-19: 1100 [IU]/h via INTRAVENOUS
  Filled 2024-02-19: qty 250

## 2024-02-19 MED ORDER — PERFLUTREN LIPID MICROSPHERE
1.0000 mL | INTRAVENOUS | Status: AC | PRN
  Administered 2024-02-19: 3 mL via INTRAVENOUS

## 2024-02-19 NOTE — Plan of Care (Signed)

## 2024-02-19 NOTE — Progress Notes (Signed)
 Heart Failure Navigator Progress Note  Assessed for Heart & Vascular TOC clinic readiness.  Patient does not meet criteria due EF 60-65%, has a scheduled [facility] appointment on 03/05/2024. No HF TOC. .   Navigator will sign off at this time.   Stephane Haddock, BSN, Scientist, clinical (histocompatibility and immunogenetics) Only

## 2024-02-19 NOTE — TOC Initial Note (Signed)
 Transition of Care (TOC) - Initial/Assessment Note  Rayfield Gobble RN, BSN Inpatient Care Management Unit 4E- RN Case Manager See Treatment Team for direct phone #   Patient Details  Name: Abigail Sharp MRN: [id number] Date of Birth: 08/01/1965  Transition of Care [facility]) CM/SW Contact:    Gobble Rayfield Hurst, RN Phone Number: 02/19/2024, 3:42 PM  Clinical Narrative:                 Consult received for medication assistance.   CM spoke with pt at bedside-  Per pt she has insurance and medication coverage- however she voiced that her insurance has changed during the year and that some meds that she used to get as 90day supply at $4, her insurance now only covers a 30day supply at $4, she says this has made things a little harder as far as affording meds- as she would prefer to get 90day supply for $4 as it last longer.   Pt uses [facility] on [address]- is looking to maybe changing to [employer].   Pt has a new PCP appointment scheduled for Oct 3 at 8:10 to see Dr. Penne at [facility].   DME- has rollator Voiced her niece can transport her home.   PT/OT evals pending- CM will continue to follow for any further needs.   Expected Discharge Plan: Home/Self Care Barriers to Discharge: Continued Medical Work up   Patient Goals and CMS Choice Patient states their goals for this hospitalization and ongoing recovery are:: return home          Expected Discharge Plan and Services   Discharge Planning Services: CM Consult, Medication Assistance   Living arrangements for the past 2 months: Apartment                                      Prior Living Arrangements/Services Living arrangements for the past 2 months: Apartment Lives with:: Self Patient language and need for interpreter reviewed:: Yes Do you feel safe going back to the place where you live?: Yes      Need for Family Participation in Patient Care: Yes (Comment)   Current home services:  DME (rollator) Criminal Activity/Legal Involvement Pertinent to Current Situation/Hospitalization: No - Comment as needed  Activities of Daily Living   ADL Screening (condition at time of admission) Independently performs ADLs?: Yes (appropriate for developmental age) Is the patient deaf or have difficulty hearing?: No Does the patient have difficulty seeing, even when wearing glasses/contacts?: No Does the patient have difficulty concentrating, remembering, or making decisions?: No  Permission Sought/Granted                  Emotional Assessment Appearance:: Appears stated age Attitude/Demeanor/Rapport: Engaged Affect (typically observed): Accepting, Calm Orientation: : Oriented to Self, Oriented to Place, Oriented to  Time, Oriented to Situation Alcohol / Substance Use: Not Applicable Psych Involvement: No (comment)  Admission diagnosis:  Microcytic anemia [D50.9] Renal insufficiency [N28.9] Elevated troponin [R79.89] Nonspecific chest pain [R07.9] Acute heart failure with preserved ejection fraction (HFpEF) (HCC) [I50.31] Acute exacerbation of chronic heart failure (HCC) [I50.9] Patient Active Problem List   Diagnosis Date Noted   Acute heart failure with preserved ejection fraction (HFpEF) (HCC) 02/18/2024   Morbid obesity (HCC) 12/06/2016   Generalized anxiety disorder 11/21/2015   Depression 11/21/2015   OCD (obsessive compulsive disorder) 11/21/2015   Obstipation 09/13/2015   Non-ischemic cardiomyopathy (HCC)  11/04/2014   Chronic systolic heart failure (HCC) 11/03/2014   Essential hypertension 11/03/2014   Microcytic anemia 11/03/2014   PCP:  Pcp, No Pharmacy:   [id number] - [place], [place] - [address] AT [facility] OF [address] [place] [address] Phone: [phone] Fax: [phone]     Social Drivers of Health (SDOH) Social History: SDOH Screenings   Food Insecurity: No Food Insecurity  (02/18/2024)  Housing: Low Risk  (02/18/2024)  Transportation Needs: No Transportation Needs (02/18/2024)  Utilities: Not At Risk (02/18/2024)  Depression (PHQ2-9): High Risk (06/19/2023)  Tobacco Use: Low Risk  (02/17/2024)   SDOH Interventions:     Readmission Risk Interventions     No data to display

## 2024-02-19 NOTE — Consult Note (Addendum)
 Cardiology Consultation   Patient ID: Abigail Sharp MRN: [id number]; DOB: 1965/07/09  Admit date: 02/17/2024 Date of Consult: 02/19/2024  PCP:  Freddrick Johns   [facility] Providers Cardiologist:  Madonna Large, DO       Patient Profile: Abigail Sharp is a 58 y.o. female with a hx of anxiety, agoraphobia, hypertension, nonischemic cardiomyopathy, morbid obesity, and HFimpEF  who is being seen 02/19/2024 for the evaluation of heart failure at the request of Abigail Sharp.  History of Present Illness: Abigail Sharp is a 58 y.o. female with a prior cardiac history as listed below.  In 11/2014 the patient had a reduced LVEF of 25% and diffuse hypokinesis.  A cardiac catheterization was done at that time and showed normal coronary arteries.  Patient was started on GDMT including Coreg  and spironolactone .  On 03/2015 LVEF had improved to 55%.  Most recent echo on 12/2020 showed an improved LVEF of 60 to 65%, no RWMA, no LVH, normal RV systolic function, grossly normal valve function.  At last office visit with Dr. Luane the patient had a sling shortness of breath was sent to the emergency department.  Patient was felt to be euvolemic and BNP was mildly elevated at 119, high-sensitivity troponins were mildly elevated and other Lab work came back reassuring.  Patient was able to ambulate in the emergency department.  Because of this the patient was sent home.  Patient presented to the emergency complaining of chest pain, shortness of breath, and abdominal pain.  Per chart review the patient has been taking her home medications including Coreg  and aldactone .  Patient was felt to be in heart failure and received multiple doses of IV Lasix .  On interview patient reported that she had shortness of breath for about 3 months and that she had this at her prior hospitalization on 01/02/2024.  Also endorses abdominal swelling, nausea, vomiting, palpitations, diaphoresis, chest pain and orthopnea for about 3  months.  Denies any lower extremity edema, PND, hematuria, hematochezia, and melena.  Lives a sedentary lifestyle and rarely leaves the house because of agoraphobia.  Reported that she has not taken her Coreg  and Aldactone  for over a year.  Reported that she checked her blood pressures about 3 to 4 months ago and had systolic BPs in the 200s.  Denies any alcohol use, tobacco use, illicit substance use.   Labs showed potassium of 4.4, elevated BUN of 30, elevated creatinine of 1.83, hypocalcemia of 8.6, elevated high-sensitivity troponin 26 > 29 > 21 > 19, Normal hemoglobin A1C of 5.3, anemia with a hemoglobin of 11.2, and elevated BNP of 856.6.  EKG showed normal sinus rhythm with a rate of 98 bpm.  Prolonged QT with a QTcB of 528.  Chest x-ray showed: Mild cardiomegaly with vascular congestion. Possible tiny pleural effusions.    Past Medical History:  Diagnosis Date   Anxiety    Chronic systolic heart failure (HCC)    Depression    HTN (hypertension)    Microcytic anemia    NICM (nonischemic cardiomyopathy) (HCC) 11/2014   EF is 25% improved to 55% in 2015.   Pre-diabetes     Past Surgical History:  Procedure Laterality Date   CARDIAC CATHETERIZATION N/A 11/07/2014   Procedure: Right/Left Heart Cath and Coronary Angiography;  Surgeon: Dorn JINNY Lesches, Sharp;  Location: [facility] INVASIVE CV LAB;  Service: Cardiovascular;  Laterality: N/A;   TONSILLECTOMY     TUBAL LIGATION       Home Medications:  Prior to  Admission medications   Medication Sig Start Date End Date Taking? Authorizing Provider  ARIPiprazole  (ABILIFY ) 5 MG tablet Take 1 tablet (5 mg total) by mouth daily. Patient not taking: Reported on 02/18/2024 09/18/23   Carvin Arvella HERO, Sharp  aspirin  81 MG EC tablet Take 1 tablet (81 mg total) by mouth daily. Patient not taking: Reported on 02/18/2024 04/17/16   Marilyne Morrison T, Sharp  busPIRone  (BUSPAR ) 10 MG tablet Take 1 tablet (10 mg total) by mouth 2 (two) times daily. Patient not  taking: Reported on 02/18/2024 07/17/23   Carvin Arvella HERO, Sharp  carvedilol  (COREG ) 6.25 MG tablet Take 1 tablet (6.25 mg total) by mouth 2 (two) times daily with a meal. Patient not taking: Reported on 02/18/2024 01/05/24   Michele, Sunit, DO  cephALEXin  (KEFLEX ) 500 MG capsule Take 1 capsule (500 mg total) by mouth 3 (three) times daily. Patient not taking: Reported on 02/18/2024 01/03/24   Horton, Charmaine FALCON, Sharp  lamoTRIgine  (LAMICTAL ) 25 MG tablet Take 1 tablet (25 mg total) by mouth 2 (two) times daily. Patient not taking: Reported on 02/18/2024 07/17/23 02/17/25  Carvin Arvella HERO, Sharp  sertraline  (ZOLOFT ) 100 MG tablet Take 2 tablets (200 mg total) by mouth daily. Patient not taking: Reported on 02/18/2024 07/17/23 07/16/24  Carvin Arvella HERO, Sharp  spironolactone  (ALDACTONE ) 25 MG tablet Take 1 tablet (25 mg total) by mouth daily. Patient not taking: Reported on 02/18/2024 01/05/24   Tolia, Sunit, DO    Scheduled Meds:  ARIPiprazole   5 mg Oral Daily   busPIRone   10 mg Oral BID   carvedilol   6.25 mg Oral BID WC   enoxaparin  (LOVENOX ) injection  40 mg Subcutaneous Q24H   fluticasone   2 spray Each Nare Daily   furosemide   40 mg Intravenous Q12H   lamoTRIgine   25 mg Oral BID   [START ON 02/20/2024] sertraline   100 mg Oral Daily   sodium chloride  flush  3 mL Intravenous Q12H   Continuous Infusions:  PRN Meds: acetaminophen  **OR** acetaminophen , hydrALAZINE   Allergies:    Allergies  Allergen Reactions   Sulfa Antibiotics Other (See Comments)    Childhood allergy with unknown reaction    Social History:   Social History   Socioeconomic History   Marital status: Single    Spouse name: Not on file   Number of children: Not on file   Years of education: Not on file   Highest education level: Not on file  Occupational History   Not on file  Tobacco Use   Smoking status: Never   Smokeless tobacco: Never  Vaping Use   Vaping status: Never Used  Substance and Sexual Activity   Alcohol use: No     Alcohol/week: 0.0 standard drinks of alcohol   Drug use: No   Sexual activity: Never  Other Topics Concern   Not on file  Social History Narrative   Social Hx:   Current living situation: Lives alone in apt in [place]: [place], [place] by parents.   Raised by parents. Parents let her do whatever she wanted to do while growing up. Both parents now deceased.    Siblings 1 brother and 2 sisters. Pt is number 3   Schooling- HS grad, faced a lot of bullied due to teen pregnancy at 65. Lived with parents while raising her oldest child but for the youngest she moved out temporarily. Her mom took over raising him at the age of 28 mo.    Employed- last job  in 2002. 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            Social Drivers of Corporate investment banker Strain: Not on file  Food Insecurity: No Food Insecurity (02/18/2024)   Hunger Vital Sign    Worried About Running Out of Food in the Last Year: Never true    Ran Out of Food in the Last Year: Never true  Transportation Needs: No Transportation Needs (02/18/2024)   PRAPARE - Administrator, Civil Service (Medical): No    Lack of Transportation (Non-Medical): No  Physical Activity: Not on file  Stress: Not on file  Social Connections: Not on file  Intimate Partner Violence: Not At Risk (02/18/2024)   Humiliation, Afraid, Rape, and Kick questionnaire    Fear of Current or Ex-Partner: No    Emotionally Abused: No    Physically Abused: No    Sexually Abused: No    Family History:    Family History  Problem Relation Age of Onset   Diabetes Mother    Hypertension Mother    Diabetes Father    Heart disease Father    Hypertension Father    Diabetes Sister    ADD / ADHD Son    Alcohol abuse Neg Hx    Anxiety disorder Neg Hx    Bipolar disorder Neg Hx    Depression Neg Hx    Drug abuse Neg Hx      ROS:  Please see  the history of present illness.   All other ROS reviewed and negative.     Physical Exam/Data: Vitals:   02/19/24 0629 02/19/24 0735 02/19/24 0813 02/19/24 1144  BP:  (!) 84/57 100/72 117/71  Pulse:  63 60 69  Resp: (!) 22 20 18 19   Temp:  97.7 F (36.5 C)  98.5 F (36.9 C)  TempSrc:  Oral  Oral  SpO2:  96% 97% 96%  Weight:      Height:        Intake/Output Summary (Last 24 hours) at 02/19/2024 1603 Last data filed at 02/19/2024 0745 Gross per 24 hour  Intake --  Output 1950 ml  Net -1950 ml      02/19/2024    6:04 AM 02/17/2024    2:57 PM 01/02/2024    2:03 PM  Last 3 Weights  Weight (lbs) 284 lb 6.3 oz 277 lb 277 lb  Weight (kg) 129 kg 125.646 kg 125.646 kg     Body mass index is 48.82 kg/m.  General:  Well nourished, well developed, in no acute distress alert and orientated on room air. HEENT: normal Neck: Positive 5+ centimeter JVD Vascular: No carotid bruits; Distal pulses 2+ bilaterally Cardiac:  normal S1, S2; RRR; no murmur . Lungs: Positive bibasilar crackles Abd: distented, nontender. Ext: no edema Musculoskeletal:  No deformities. Skin: warm and dry  Neuro:  no focal abnormalities noted Psych:  Normal affect   EKG:  The EKG was personally reviewed and demonstrates:  normal sinus rhythm with a rate of 98 bpm.  Prolonged QT with a QTcB of 528. Telemetry:  Telemetry was personally reviewed and demonstrates: Currently in normal sinus rhythm with heart rates in the 60's to 70's.  Last night from about 6:30-11:30 patient was in atrial flutter heart rates in the 80s to 110s.  Relevant CV Studies: Echo pending  Laboratory Data: High Sensitivity Troponin:   Recent Labs  Lab 02/17/24 1610 02/17/24 1755 02/18/24 1710 02/18/24 2007  TROPONINIHS 26* 29* 21* 19*     Chemistry Recent Labs  Lab 02/17/24 1610 02/19/24 0342  NA 141 138  K 4.5 4.4  CL 110 100  CO2 21* 26  GLUCOSE 100* 97  BUN 29* 30*  CREATININE 1.61* 1.83*  CALCIUM 9.1 8.6*  GFRNONAA  37* 32*  ANIONGAP 10 12    Recent Labs  Lab 02/17/24 1610  PROT 6.4*  ALBUMIN 3.3*  AST 23  ALT 24  ALKPHOS 76  BILITOT 0.7   Lipids No results for input(s): CHOL, TRIG, HDL, LABVLDL, LDLCALC, CHOLHDL in the last 168 hours.  Hematology Recent Labs  Lab 02/17/24 1610  WBC 6.1  RBC 4.49  HGB 11.2*  HCT 35.6*  MCV 79.3*  MCH 24.9*  MCHC 31.5  RDW 18.5*  PLT 235   Thyroid No results for input(s): TSH, FREET4 in the last 168 hours.  BNP Recent Labs  Lab 02/17/24 1610  BNP 856.6*    DDimer No results for input(s): DDIMER in the last 168 hours.  Radiology/Studies:  DG Chest 2 View Result Date: 02/17/2024 CLINICAL DATA:  Shortness of breath.  Chest pain. EXAM: CHEST - 2 VIEW COMPARISON:  01/02/2024 FINDINGS: Mild cardiomegaly. Vascular congestion without edema. Possible tiny pleural effusions. No confluent opacity. No pneumothorax. On limited assessment, no acute osseous findings. IMPRESSION: Mild cardiomegaly with vascular congestion. Possible tiny pleural effusions. Electronically Signed   By: Andrea Gasman M.D.   On: 02/17/2024 16:04     Assessment and Plan: Abigail Sharp is a 58 y.o. female with a hx of anxiety, agoraphobia, hypertension, nonischemic cardiomyopathy, morbid obesity, and HFimpEF  who is being seen 02/19/2024 for the evaluation of heart failure at the request of Abigail Sharp.  Acute on chronic heart failure History of nonischemic cardiomyopathy In 2016 the patient was admitted for heart failure and echo showed a reduced LVEF of 25%.  Catheterization was done and showed normal coronary arteries.  Most recent echo in 2022 showed improved LVEF of 60 to 65%. Patient presented to the emergency complaining of chest pain, and shortness of breath for 3 months. Reported that she has not taken her Coreg  and Aldactone  for over a year.  Reported that she checked her blood pressures about 3 to 4 months ago and had systolic BPs in the 200s.  BNP was  elevated 856.6. Chest x-ray showed: Mild cardiomegaly with vascular congestion. Possible tiny pleural effusions. I's and O's are out 3 L.  Weight on 8/1 was 277.  Most recent weight on 02/19/2024 was 284 pounds.  The patient was admitted to the hospitalist service and started on IV Lasix .  Reported that her shortness of breath has improved significantly. Order daily weights. Echo pending Increase to 80 mg IV Lasix  twice daily GDMT Continue Coreg  6.25 mg twice daily   New onset atrial flutter CHA2DS2-VASc Score = 3 [CHF History: 1, HTN History: 1, Diabetes History: 0, Stroke History: 0, Vascular Disease History: 0, Age Score: 0, Gender Score: 1].  Therefore, the patient's annual risk of stroke is 3.2 %. It is likely that this is contributing to the patient's symptoms.  Currently in sinus rhythm. Potassium 4.4, magnesium  1.9.  With morbid obesity likely has a degree of OSA. Start IV heparin  Needs CBC,  CMP, and TSH   Hypertension Patient's blood pressure was initially elevated.  Because of this the patient was  started on Coreg  12.5 mg twice daily and amlodipine  5 mg daily.  After receiving these medications the patient's blood pressure became hypotensive.  Because of this amlodipine  was stopped and Coreg  was reduced to 6.25 mg twice daily.   Elevated high-sensitivity troponins 26 > 29 > 21 > 19 Most consistent with demand ischemia   Prolonged QTC Prolonged QT with a QTcB of 528. Limit QTc prolonging medications.   Morbid obesity Patient would benefit from GLP-1   Agoraphobia Anxiety Depression OCD Because of her agoraphobia she lives a sedentary lifestyle. The patient's mental health history complicates her medical management. Management per psychiatry    Risk Assessment/Risk Scores:     New York  Heart Association (NYHA) Functional Class NYHA Class II  CHA2DS2-VASc Score = 3  1} This indicates a 3.2% annual risk of stroke. The patient's score is based upon: CHF  History: 1 HTN History: 1 Diabetes History: 0 Stroke History: 0 Vascular Disease History: 0 Age Score: 0 Gender Score: 1      For questions or updates, please contact [facility] Please consult [email or web address] for contact info under     Signed, Morse Clause, PA-C  02/19/2024 4:03 PM   I have personally evaluated and examined the patient. The history, physical exam, and medical decision making documented below were performed independently and substantively by me. I have reviewed all relevant data, formulated the assessment and plan, and assumed responsibility for the management of this patient. My documentation reflects the substantive portion of the split/shared visit, in accordance with CMS and CPT guidelines. I have updated the NPP documentation above as appropriate (AFL discussions and diuresis assessment).  I have personally performed the substantive portion of the medical decision making, including interpretation of diagnostic data, formulation of the management plan, and assessment of risks. In summation:  Ms. Topor with non-ischemic cardiomyopathy, presents with shortness of breath and chest pain. She was referred by Dr. Michele for evaluation of shortness of breath back in August and was previously managed by Dr. Claudene.  We are consulted today by Dr. CHRISTOBAL.  She has a history of non-ischemic cardiomyopathy diagnosed in 2016 with an initial left ventricular ejection fraction (LVEF) of 25%. A cardiac catheterization at that time showed no blockages, and she was started on guideline-directed medical therapy (GDMT). Her LVEF improved to 60-65% by 2022.  Recently, she has experienced shortness of breath, chest pain, palpitations, orthopnea, nausea, and vomiting for the past three months. She denies lower extremity edema but reports abdominal swelling. She has been off her blood pressure medications for over a year and last checked her blood pressure three to four months ago, with  systolic readings in the 120s. She discarded her blood pressure monitor after that.  She has a mental health history of agoraphobia, anxiety, and depression, which has led to her being not very active and reluctant to leave the house. Psychiatry is involved in her care.  She has a history of chronic kidney disease (CKD) with recent lab work showing elevated creatinine and BMP. She also has anemia with a hemoglobin level of 11.9 g/dL.  She was admitted on August 1st for the first time and is currently on her second admission. During her first admission, she was euvolemic with mildly elevated troponins and negative BNP. She was able to walk in the hallway and was sent home. She reports feeling better after diuresis but still experiences pain and discomfort, particularly when putting on clothes and performing activities like walking or taking out  the trash. She notes improvement in breathing when sitting but experiences difficulty when standing or moving.  Exam notable for  Gen: no distress, super morbid obesity  Neck: + JVD sitting up Cardiac: No Rubs or Gallops, no Murmur but distant heart sounds, RRR +2 radial pulses Respiratory: Clear to auscultation bilaterally, normal effort, normal  respiratory rate GI: Soft, nontender, distended with fluid wave  MS: Non pitting edema;  moves all extremities Integument: Skin feels warm Neuro:  At time of evaluation, alert and oriented to person/place/time/situation  Psych: Normal affect, patient feels fair  Personally reviewed data and interpretation:   Labs notable for Hgb 11.9 newly decreased. Tele: SR-> AFL rate controled at 1900 with termination at 2300; patient was asymptomatic and asleep during this time.  In assessment and plan:   Acute on chronic heart failure with reduced ejection fraction Acute on chronic heart failure with reduced ejection fraction, likely exacerbated by non-adherence to GDMT for over a year. Non-ischemic cardiomyopathy with  previous EF of 25%, improved to 60-65% in 2022. Current symptoms include shortness of breath, chest pain, palpitations, orthopnea, nausea, vomiting, and abdominal swelling. Positive JVD and crackles noted. Concerns about fluid overload and inadequate diuresis. Echocardiogram pending to assess current EF. - NYHA III, hypervolemic, much of the fluid is in her abdomen, increase in 80 mg IV BID - Pending echocardiogram to assess ejection fraction - Consider right heart catheterization if fluid status remains unclear (patient is aware but has not consented, prior 2016 LHC was too painful from the femoral approach)  Paroxysmal atrial flutter Paroxysmal atrial flutter with episodes during hospitalization. Increased risk of stroke due to atrial flutter and heart failure. - Initiate anticoagulation with heparin  - Monitor heart rhythm and rate on tele - Order EKG to assess QT interval and heart rhythm  Acute kidney injury on chronic kidney disease, unspecified stage Acute kidney injury on chronic kidney disease, possibly exacerbated by diuresis and lack of medical care for over a year. Elevated creatinine and BMP noted. - unclear baseline this may be her new one since stopping all medications.  Hypertension Hypertension with labile blood pressures. Non-adherence to antihypertensive medications for over a year. Management complicated by acute heart failure and atrial flutter. - Avoid increasing Coreg  dose due to acute heart failure; titration of therpay pending echo  QT interval prolongation - Ordered EKG to monitor QT interval - reviewed findings with psychiatry team   Super morbid obesity Super morbid obesity complicating management of heart failure and diuresis. - will not proceed with SGLT2i this admission  Anemia, unspecified Anemia with hemoglobin of 11.9 g/dL. - CBC to monitor hemoglobin levels  Agoraphobia with comorbid anxiety and depression Agoraphobia with anxiety and depression,  contributing to non-adherence to medical care. Psychiatry involved in management.  Stanly Leavens, Sharp FASE [facility] Cardiologist [facility]  [address] [place], [address] [place], [place] [address] [phone]  5:37 PM

## 2024-02-19 NOTE — Progress Notes (Signed)
 Echocardiogram 2D Echocardiogram has been performed.  Abigail Sharp 02/19/2024, 5:57 PM

## 2024-02-19 NOTE — Evaluation (Signed)
 Physical Therapy Evaluation Patient Details Name: Abigail Sharp MRN: [id number] DOB: [birth year] Today's Date: 02/19/2024  History of Present Illness  Pt is 58 yo presenting to [facility] on 9/16 due to acute heart failure on chronic. MH: CHF, anxiety/depression, agoraphobia, HTN, nonischemic cardiomyopathy, and HFimpEF.  Clinical Impression  Pt is presenting as mod I for bed mobility,  supervision for sit to stand and CGA/Min A for gait without an AD. Pt reports she has no help at home and that she is living in an empty 2 story town home. Pt currently is moving at an unsafe level to go home to an empty town home with no furniture this places her at a very high risk for falls or getting stuck on the floor. Due to pt current functional status, home set up and available assistance at home recommending skilled physical therapy services 3x/week in order to address strength, balance and functional mobility to decrease risk for falls, injury and re-hospitalization.         If plan is discharge home, recommend the following: A little help with walking and/or transfers;Assistance with cooking/housework;Assist for transportation;Help with stairs or ramp for entrance     Equipment Recommendations Wheelchair (measurements PT);Wheelchair cushion (measurements PT)     Functional Status Assessment Patient has had a recent decline in their functional status and demonstrates the ability to make significant improvements in function in a reasonable and predictable amount of time.     Precautions / Restrictions Precautions Precautions: Fall Recall of Precautions/Restrictions: Impaired Restrictions Weight Bearing Restrictions Per Provider Order: No      Mobility  Bed Mobility Overal bed mobility: Modified Independent      Transfers Overall transfer level: Needs assistance Equipment used: None Transfers: Sit to/from Stand Sit to Stand: Supervision           General transfer comment: for safety; poor  balance.    Ambulation/Gait Ambulation/Gait assistance: Contact guard assist, Min assist Gait Distance (Feet): 150 Feet Assistive device: None Gait Pattern/deviations: Step-through pattern, Wide base of support Gait velocity: decreased Gait velocity interpretation: <1.8 ft/sec, indicate of risk for recurrent falls   General Gait Details: pt with 1x LOB requiring Min A for assist, stepping stragety used bypassing ankle and hip. Pt with CGA for the rest of gait. Wide BOS due to poor balance.    Balance Overall balance assessment: Needs assistance Sitting-balance support: Single extremity supported, Feet supported Sitting balance-Leahy Scale: Good     Standing balance support: No upper extremity supported, During functional activity Standing balance-Leahy Scale: Poor Standing balance comment: pt with wide BOS, wide UE's and 1x LOB requiring assist to prevent fall       Pertinent Vitals/Pain Pain Assessment Pain Assessment: 0-10 Pain Score: 10-Worst pain ever Pain Location: bil knees Pain Descriptors / Indicators: Aching Pain Intervention(s): Monitored during session, Limited activity within patient's tolerance    Home Living Family/patient expects to be discharged to:: Private residence Living Arrangements: Alone Available Help at Discharge: Family;Available PRN/intermittently Type of Home: Other(Comment) (town home) Home Access: Stairs to enter Entrance Stairs-Rails: None Secretary/administrator of Steps: 1   Home Layout: Other (Comment) (can live on main level with BSC) Home Equipment: Rollator (4 wheels);BSC/3in1      Prior Function Prior Level of Function : Independent/Modified Independent             Mobility Comments: pt reports she gets around holding onto walls. Reports multiple falls off of a chair ADLs Comments: Mod I for ADLs.  Extremity/Trunk Assessment   Upper Extremity Assessment Upper Extremity Assessment: Generalized weakness    Lower  Extremity Assessment Lower Extremity Assessment: Generalized weakness    Cervical / Trunk Assessment Cervical / Trunk Assessment: Normal  Communication   Communication Communication: No apparent difficulties    Cognition Arousal: Alert Behavior During Therapy: WFL for tasks assessed/performed   PT - Cognitive impairments: No family/caregiver present to determine baseline, Problem solving, Safety/Judgement     PT - Cognition Comments: pt reporting she lives in a home with no furniture and fell off the same chair multiple times then tried to tie it together with a phone charging cord. Following commands: Intact       Cueing Cueing Techniques: Verbal cues            Assessment/Plan    PT Assessment Patient needs continued PT services  PT Problem List Decreased balance;Decreased mobility;Decreased activity tolerance;Pain       PT Treatment Interventions DME instruction;Therapeutic activities;Gait training;Stair training;Functional mobility training;Balance training;Therapeutic exercise;Patient/family education;Wheelchair mobility training    PT Goals (Current goals can be found in the Care Plan section)  Acute Rehab PT Goals Patient Stated Goal: to go home independently PT Goal Formulation: All assessment and education complete, DC therapy Time For Goal Achievement: 03/04/24 Potential to Achieve Goals: Fair    Frequency Min 2X/week        AM-PAC PT 6 Clicks Mobility  Outcome Measure Help needed turning from your back to your side while in a flat bed without using bedrails?: None Help needed moving from lying on your back to sitting on the side of a flat bed without using bedrails?: None Help needed moving to and from a bed to a chair (including a wheelchair)?: A Little Help needed standing up from a chair using your arms (e.g., wheelchair or bedside chair)?: A Little Help needed to walk in hospital room?: A Little Help needed climbing 3-5 steps with a railing? :  A Lot 6 Click Score: 19    End of Session Equipment Utilized During Treatment: Gait belt Activity Tolerance: Patient tolerated treatment well Patient left: in bed;with call bell/phone within reach Nurse Communication: Mobility status PT Visit Diagnosis: Unsteadiness on feet (R26.81);Muscle weakness (generalized) (M62.81)    Time: 1527-1600 PT Time Calculation (min) (ACUTE ONLY): 33 min   Charges:   PT Evaluation $PT Eval Low Complexity: 1 Low PT Treatments $Therapeutic Activity: 8-22 mins PT General Charges $$ ACUTE PT VISIT: 1 Visit       [name], DPT, CLT  Acute Rehabilitation Services Office: [phone] (Secure chat preferred)   [name] 02/19/2024, 4:06 PM

## 2024-02-19 NOTE — Progress Notes (Signed)
 PHARMACY - ANTICOAGULATION CONSULT NOTE  Pharmacy Consult for Heparin  Indication: atrial fibrillation  Allergies  Allergen Reactions   Sulfa Antibiotics Other (See Comments)    Childhood allergy with unknown reaction    Patient Measurements: Height: 5' 4 (162.6 cm) Weight: 129 kg (284 lb 6.3 oz) IBW/kg (Calculated) : 54.7 HEPARIN  DW (KG): 85.6  Vital Signs: Temp: 98.5 F (36.9 C) (09/18 1144) Temp Source: Oral (09/18 1144) BP: 117/71 (09/18 1144) Pulse Rate: 69 (09/18 1144)  Labs: Recent Labs    02/17/24 1610 02/17/24 1755 02/18/24 1710 02/18/24 2007 02/19/24 0342  HGB 11.2*  --   --   --   --   HCT 35.6*  --   --   --   --   PLT 235  --   --   --   --   CREATININE 1.61*  --   --   --  1.83*  TROPONINIHS 26* 29* 21* 19*  --     Estimated Creatinine Clearance: 45.2 mL/min (A) (by C-G formula based on SCr of 1.83 mg/dL (H)).   Medical History: Past Medical History:  Diagnosis Date   Anxiety    Chronic systolic heart failure (HCC)    Depression    HTN (hypertension)    Microcytic anemia    NICM (nonischemic cardiomyopathy) (HCC) 11/2014   EF is 25% improved to 55% in 2015.   Pre-diabetes     Medications:  Scheduled:   ARIPiprazole   5 mg Oral Daily   busPIRone   10 mg Oral BID   carvedilol   6.25 mg Oral BID WC   enoxaparin  (LOVENOX ) injection  40 mg Subcutaneous Q24H   fluticasone   2 spray Each Nare Daily   furosemide   40 mg Intravenous Q12H   lamoTRIgine   25 mg Oral BID   [START ON 02/20/2024] sertraline   100 mg Oral Daily   sodium chloride  flush  3 mL Intravenous Q12H   Infusions:  PRN: acetaminophen  **OR** acetaminophen , hydrALAZINE   Assessment: 58 yo female admitted with acute on chronic HF. Pharmacy consulted to dose IV heparin  for new onset aflutter. Not on AC PTA  Received dose of Lovenox  40mg  at 15:16  Hgb 11.2 Plts 235   Goal of Therapy:  Heparin  level 0.3-0.7 units/ml Monitor platelets by anticoagulation protocol: Yes   Plan:   Start heparin  infusion at 1100 units/hr Check anti-Xa level in 8 hours and daily while on heparin  Continue to monitor H&H and platelets  [name], PharmD, BCPS 02/19/2024,4:11 PM  Please check AMION for all [facility] phone numbers After 10:00 PM, call Main Pharmacy [phone]
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
## 2024-02-19 NOTE — Progress Notes (Signed)
 Mobility Specialist Progress Note:   02/19/24 1221  Mobility  Activity Ambulated with assistance  Level of Assistance Contact guard assist, steadying assist  Assistive Device None  Distance Ambulated (ft) 15 ft  Activity Response Tolerated well  Mobility Referral Yes  Mobility visit 1 Mobility  Mobility Specialist Start Time (ACUTE ONLY) 1210  Mobility Specialist Stop Time (ACUTE ONLY) 1220  Mobility Specialist Time Calculation (min) (ACUTE ONLY) 10 min   Pt received in bed, requesting assistance to bathroom. MinG for safety. Tolerated well, c/o acid reflux, nausea and pain. Pt left in bathroom and encouraged to use call light when ready to transfer. All needs met, linens changed.   Demitris Pokorny Mobility Specialist Please contact via Special educational needs teacher or  Rehab office at [phone]

## 2024-02-19 NOTE — Progress Notes (Signed)
 PROGRESS NOTE Abigail Sharp  FMW:[id number] DOB: 1965/06/07 DOA: 02/17/2024 PCP: Pcp, No  Brief Narrative/Hospital Course: Abigail Sharp is a 58 y.o. female with PMH of hypertension, HFpEF, anxiety presenting with chest pain and shortness of breath.symoptoms going on for months aand worse with activity, some orthopnea. She gets pain all over on walking. Never had sleep apnea. She also c/o chest pain when she wakes up as she cannont breather and feels lie  heart coming out of chest  reports having cath in 2016 and no stent needed. She also c/o nasal stuffiness, cough for sometime. She has lots of complaints feeling hot. [name]  has been having nausea. Patient otherwise denies any nausea, vomiting,fever, chills, headache, focal weakness, numbness tingling, speech difficulties.  She endorses weight gain was 277 lb on 8/1 and repeat pending.  In the ED:BP poorly controlled 160/110, saturating 90 to 97% on room air, heart rate 102 afebrile somewhat tachypneic in 20s EKG without STEMI.  Hemoglobin 11.2, FOBT negative, creatinine 1.6 (stable), troponin 26 > 29, BNP 856.  UA STABLE Chest x-ray showing vascular congestion and possible tiny pleural effusions.  Patient was given aspirin , IV Lasix  40 mg, and morphine  and admission was requested for CHF exacerbation  Subjective: Seen and examined today No new complaints Reports she is feeling better today still has orthopnea and dyspnea, leg edema BP had been soft 80s-100 overnight. Labs with creatinine slightly at 1.8  Assessment and plan:  Acute on chronic systolic CHF with improved EF NICM: Recently seen by Dr. LELON on 8/1 and euvolemic.  Has not been on heart failure medication for months and was placed on Coreg  Aldactone  along with repeat echo-patient reports she has not started taking any meds-just got them prior to admission, also not taking her psych meds suspect noncompliance.  Her weight when in August was 277 pounds on admission to 284 LB after some  diuresis. Continue on diuresis as tolerated follow-up echocardiogram. LAST EF 60 to 65% 2022 in July previously was 25% in 2016 Consult cardiology today.  Cont to monitor daily I/O,weight, electrolytes and net balance as below.Keep on  salt/fluid restricted diet and monitor in tele. Net IO Since Admission: -3,050 mL [02/19/24 1137]  Filed Weights   02/17/24 1457 02/19/24 0604  Weight: 125.6 kg 129 kg    Recent Labs  Lab 02/17/24 1610 02/19/24 0342  BNP 856.6*  --   BUN 29* 30*  CREATININE 1.61* 1.83*  K 4.5 4.4   AKI on CKD 3B versus 4: Last creatinine in August was 2.1 although previously up to 1.2.  Monitor.  Monitor Recent Labs    01/02/24 1504 01/02/24 2353 02/17/24 1610 02/19/24 0342  BUN 39*  --  29* 30*  CREATININE 2.18*  --  1.61* 1.83*  CO2 21*  --  21* 26  K 4.8 4.6 4.5 4.4    Hypertension with diabetes: BP soft. Add midodrine po  Elevated troponin flat likely due to demand ischemia: EKG nonischemic.  Open flat follow-up echo  Microcytic anemia Hemoglobin stable at 11 g.  Anxiety/depression/OCD: Mood stable. Cont home meds.  Patient reports being nonadherent to the meds contributing to her symptoms it seems. On Abilify , BuSpar  Lamictal  Zoloft .  Consulted psych while here and she will need very close follow-up.   Class III Obesity w/ Body mass index is 48.82 kg/m.: Will benefit with PCP follow-up, weight loss,healthy lifestyle  Morbid Obesity w/ Body mass index is 48.82 kg/m.: Will benefit with PCP follow-up, weight loss,healthy lifestyle and outpatient  sleep eval if not done.  Mobility: PT Orders:  PT Follow up Rec:    DVT prophylaxis: enoxaparin  (LOVENOX ) injection 40 mg Start: 02/18/24 1400 Code Status:   Code Status: Full Code Family Communication: plan of care discussed with patient at bedside. Patient status is: Remains hospitalized because of severity of illness Level of care: Telemetry Cardiac   Dispo: The patient is from: home             Anticipated disposition: TBD Objective: Vitals last 24 hrs: Vitals:   02/19/24 0604 02/19/24 0629 02/19/24 0735 02/19/24 0813  BP:   (!) 84/57 100/72  Pulse:   63 60  Resp: (!) 25 (!) 22 20 18   Temp:   97.7 F (36.5 C)   TempSrc:   Oral   SpO2:   96% 97%  Weight: 129 kg     Height:        Physical Examination: General exam: alert awake, oriented, older than stated age HEENT:Oral mucosa moist, Ear/Nose WNL grossly Respiratory system: Bilaterally clear BS,no use of accessory muscle Cardiovascular system: S1 & S2 +, No JVD. Gastrointestinal system: Abdomen soft,NT,ND, BS+ Nervous System: Alert, awake, moving all extremities,and following commands. Extremities: extremities warm, leg edema + Skin: No rashes,no icterus. MSK: Normal muscle bulk,tone, power   Medications reviewed:  Scheduled Meds:  ARIPiprazole   5 mg Oral Daily   busPIRone   10 mg Oral BID   carvedilol   6.25 mg Oral BID WC   enoxaparin  (LOVENOX ) injection  40 mg Subcutaneous Q24H   fluticasone   2 spray Each Nare Daily   furosemide   40 mg Intravenous Q12H   lamoTRIgine   25 mg Oral BID   sertraline   200 mg Oral Daily   sodium chloride  flush  3 mL Intravenous Q12H   Continuous Infusions: Diet: Diet Order             Diet Heart Room service appropriate? Yes with Assist; Fluid consistency: Thin  Diet effective now                    Data Reviewed: I have personally reviewed following labs and imaging studies ( see epic result tab) CBC: Recent Labs  Lab 02/17/24 1610  WBC 6.1  HGB 11.2*  HCT 35.6*  MCV 79.3*  PLT 235   CMP: Recent Labs  Lab 02/17/24 1610 02/19/24 0342  NA 141 138  K 4.5 4.4  CL 110 100  CO2 21* 26  GLUCOSE 100* 97  BUN 29* 30*  CREATININE 1.61* 1.83*  CALCIUM 9.1 8.6*   GFR: Estimated Creatinine Clearance: 45.2 mL/min (A) (by C-G formula based on SCr of 1.83 mg/dL (H)). Recent Labs  Lab 02/17/24 1610  AST 23  ALT 24  ALKPHOS 76  BILITOT 0.7  PROT 6.4*  ALBUMIN  3.3*   No results for input(s): LIPASE, AMYLASE in the last 168 hours. No results for input(s): AMMONIA in the last 168 hours. Coagulation Profile: No results for input(s): INR, PROTIME in the last 168 hours. Unresulted Labs (From admission, onward)     Start     Ordered   02/25/24 0500  Creatinine, serum  (enoxaparin  (LOVENOX )    CrCl >/= 30 ml/min)  Weekly,   R     Comments: while on enoxaparin  therapy    02/18/24 0904   02/18/24 1442  Creatinine clearance, urine, 24 hour  (Creatinine Clearance, urine, 24-hour panel)  Once,   R        02/18/24 1441  02/18/24 1442  Draw extra clot tube  (Creatinine Clearance, urine, 24-hour panel)  Once,   R        02/18/24 1441           Antimicrobials/Microbiology: Anti-infectives (From admission, onward)    None         Component Value Date/Time   SDES URINE, CLEAN CATCH 01/03/2024 0025   SPECREQUEST  01/03/2024 0025    NONE Performed at [facility], [address], [place], [place] [address]    CULT 70,000 COLONIES/mL ESCHERICHIA COLI (A) 01/03/2024 0025   REPTSTATUS 01/05/2024 FINAL 01/03/2024 0025    Procedures:    Mennie LAMY, MD [facility] 02/19/2024, 11:37 AM

## 2024-02-20 ENCOUNTER — Telehealth (HOSPITAL_COMMUNITY): Payer: Self-pay | Admitting: Pharmacy Technician

## 2024-02-20 ENCOUNTER — Other Ambulatory Visit (HOSPITAL_COMMUNITY): Payer: Self-pay

## 2024-02-20 ENCOUNTER — Inpatient Hospital Stay (HOSPITAL_COMMUNITY): Payer: MEDICAID

## 2024-02-20 DIAGNOSIS — R9431 Abnormal electrocardiogram [ECG] [EKG]: Secondary | ICD-10-CM | POA: Insufficient documentation

## 2024-02-20 DIAGNOSIS — N179 Acute kidney failure, unspecified: Secondary | ICD-10-CM | POA: Diagnosis not present

## 2024-02-20 DIAGNOSIS — N189 Chronic kidney disease, unspecified: Secondary | ICD-10-CM | POA: Diagnosis not present

## 2024-02-20 DIAGNOSIS — I5031 Acute diastolic (congestive) heart failure: Secondary | ICD-10-CM | POA: Diagnosis not present

## 2024-02-20 DIAGNOSIS — I4892 Unspecified atrial flutter: Secondary | ICD-10-CM | POA: Diagnosis not present

## 2024-02-20 DIAGNOSIS — I509 Heart failure, unspecified: Secondary | ICD-10-CM | POA: Diagnosis not present

## 2024-02-20 LAB — ECHOCARDIOGRAM COMPLETE
AR max vel: 2.06 cm2
AV Peak grad: 10 mmHg
Ao pk vel: 1.58 m/s
Area-P 1/2: 4.6 cm2
Height: 64 in
S' Lateral: 4.4 cm
Weight: 4550.29 [oz_av]

## 2024-02-20 LAB — HEPARIN LEVEL (UNFRACTIONATED)
Heparin Unfractionated: 0.47 [IU]/mL (ref 0.30–0.70)
Heparin Unfractionated: 0.49 [IU]/mL (ref 0.30–0.70)

## 2024-02-20 LAB — COMPREHENSIVE METABOLIC PANEL WITH GFR
ALT: 16 U/L (ref 0–44)
AST: 17 U/L (ref 15–41)
Albumin: 3.1 g/dL — ABNORMAL LOW (ref 3.5–5.0)
Alkaline Phosphatase: 67 U/L (ref 38–126)
Anion gap: 13 (ref 5–15)
BUN: 41 mg/dL — ABNORMAL HIGH (ref 6–20)
CO2: 27 mmol/L (ref 22–32)
Calcium: 8.4 mg/dL — ABNORMAL LOW (ref 8.9–10.3)
Chloride: 99 mmol/L (ref 98–111)
Creatinine, Ser: 2.12 mg/dL — ABNORMAL HIGH (ref 0.44–1.00)
GFR, Estimated: 27 mL/min — ABNORMAL LOW (ref >60)
Glucose, Bld: 98 mg/dL (ref 70–99)
Potassium: 4 mmol/L (ref 3.5–5.1)
Sodium: 139 mmol/L (ref 135–145)
Total Bilirubin: 0.7 mg/dL (ref 0.0–1.2)
Total Protein: 6 g/dL — ABNORMAL LOW (ref 6.5–8.1)

## 2024-02-20 LAB — CBC
HCT: 36.2 % (ref 36.0–46.0)
Hemoglobin: 11.5 g/dL — ABNORMAL LOW (ref 12.0–15.0)
MCH: 24.8 pg — ABNORMAL LOW (ref 26.0–34.0)
MCHC: 31.8 g/dL (ref 30.0–36.0)
MCV: 78 fL — ABNORMAL LOW (ref 80.0–100.0)
Platelets: 240 K/uL (ref 150–400)
RBC: 4.64 MIL/uL (ref 3.87–5.11)
RDW: 18.2 % — ABNORMAL HIGH (ref 11.5–15.5)
WBC: 7.3 K/uL (ref 4.0–10.5)
nRBC: 0 % (ref 0.0–0.2)

## 2024-02-20 LAB — MAGNESIUM: Magnesium: 1.8 mg/dL (ref 1.7–2.4)

## 2024-02-20 LAB — TSH: TSH: 1.124 u[IU]/mL (ref 0.350–4.500)

## 2024-02-20 MED ORDER — SERTRALINE HCL 50 MG PO TABS
50.0000 mg | ORAL_TABLET | Freq: Every day | ORAL | Status: DC
  Administered 2024-02-21 – 2024-03-03 (×12): 50 mg via ORAL
  Filled 2024-02-20 (×12): qty 1

## 2024-02-20 MED ORDER — SODIUM CHLORIDE 0.9 % IV BOLUS
500.0000 mL | Freq: Once | INTRAVENOUS | Status: AC
  Administered 2024-02-20: 500 mL via INTRAVENOUS

## 2024-02-20 MED ORDER — APIXABAN 5 MG PO TABS
5.0000 mg | ORAL_TABLET | Freq: Two times a day (BID) | ORAL | Status: DC
  Administered 2024-02-20 – 2024-02-24 (×9): 5 mg via ORAL
  Filled 2024-02-20: qty 2
  Filled 2024-02-20 (×8): qty 1

## 2024-02-20 MED ORDER — MAGNESIUM OXIDE -MG SUPPLEMENT 400 (240 MG) MG PO TABS
400.0000 mg | ORAL_TABLET | Freq: Once | ORAL | Status: AC
  Administered 2024-02-20: 400 mg via ORAL
  Filled 2024-02-20: qty 1

## 2024-02-20 MED ORDER — ALUM & MAG HYDROXIDE-SIMETH 200-200-20 MG/5ML PO SUSP
30.0000 mL | ORAL | Status: DC | PRN
  Administered 2024-02-20: 30 mL via ORAL
  Filled 2024-02-20 (×2): qty 30

## 2024-02-20 NOTE — Progress Notes (Signed)
 Per d/w Dr. Santo, give 500cc IVF bolus for orthostasis.

## 2024-02-20 NOTE — Discharge Instructions (Addendum)
 Information on my medicine - ELIQUIS  (apixaban )  This medication education was reviewed with me or my healthcare representative as part of my discharge preparation.    Why was Eliquis  prescribed for you? Eliquis  was prescribed for you to reduce the risk of a blood clot forming that can cause a stroke if you have a medical condition called atrial fibrillation (a type of irregular heartbeat).  What do You need to know about Eliquis  ? Take your Eliquis  TWICE DAILY - one tablet in the morning and one tablet in the evening with or without food. If you have difficulty swallowing the tablet whole please discuss with your pharmacist how to take the medication safely.  Take Eliquis  exactly as prescribed by your doctor and DO NOT stop taking Eliquis  without talking to the doctor who prescribed the medication.  Stopping may increase your risk of developing a stroke.  Refill your prescription before you run out.  After discharge, you should have regular check-up appointments with your healthcare provider that is prescribing your Eliquis .  In the future your dose may need to be changed if your kidney function or weight changes by a significant amount or as you get older.  What do you do if you miss a dose? If you miss a dose, take it as soon as you remember on the same day and resume taking twice daily.  Do not take more than one dose of ELIQUIS  at the same time to make up a missed dose.  Important Safety Information A possible side effect of Eliquis  is bleeding. You should call your healthcare provider right away if you experience any of the following: Bleeding from an injury or your nose that does not stop. Unusual colored urine (red or dark brown) or unusual colored stools (red or black). Unusual bruising for unknown reasons. A serious fall or if you hit your head (even if there is no bleeding).  Some medicines may interact with Eliquis  and might increase your risk of bleeding or clotting  while on Eliquis . To help avoid this, consult your healthcare provider or pharmacist prior to using any new prescription or non-prescription medications, including herbals, vitamins, non-steroidal anti-inflammatory drugs (NSAIDs) and supplements.  This website has more information on Eliquis  (apixaban ): [email or web address]     Psychiatry and Counseling [facility] [address] [place], [place] [phone]  Psych[facility]rists [facility]    [facility] [address]    [address] [place], [place] [address]     [place], [place] [address] [phone]      [phone]  Dr. Elna Lo     [facility] [address]    [address] [place] [address]     [place] [address] [phone]      [phone]  Ima Anon, MD     [facility] [address]     [address] [place] [address]     [place] [address] [phone]        [phone]  [facility]rap[facility]    [facility] [address]    [address] [place], [place] [phone]      [phone]  [facility]  [facility] [address]     [address] [place] [address]     [place],  [phone]  [phone]   [facility]    [facility] [address]    [address] [place], [place] [address]     [place], [place] [address] [phone]      [phone]  [facility]   [facility] [address]     [address] [place], [place] [address]     [place], [place] [address] [phone]      [phone]  These referrals have been provided to you as appropriate for your clinical needs while taking into account your financial  concerns. Please be aware that agencies, practitioners and insurance companies sometimes change contracts. When calling to make an appointment have your insurance information available so the professional you are going to see can confirm whether they are covered by your plan. Take this form with you in case the person you are seeing needs a copy or to contact us .

## 2024-02-20 NOTE — Progress Notes (Addendum)
 Progress Note  Patient Name: Cecely Rengel Date of Encounter: 02/20/2024  Primary Cardiologist: Madonna Large, DO  Subjective   Patient reports SOB significantly improved than it was when it started, still not completely back to baseline.  Had dizziness sitting up this AM. Per OT, She was already sitting on the EOB when I entered. As soon as she tried to scoot she was lightheaded BP: was 90/61 (69), she was too dizzy to stand and she asked to lay back down. After 3 minutes in supine BP: 83/55 (64). She did some ankle pumps and glute squeezes and after another 3 minutes it was 91/47 (60).  She is now lying down and feeling OK. No CP. No edema. Remains in NSR.  Inpatient Medications    Scheduled Meds:  ARIPiprazole   5 mg Oral Daily   busPIRone   10 mg Oral BID   carvedilol   6.25 mg Oral BID WC   fluticasone   2 spray Each Nare Daily   furosemide   80 mg Intravenous BID   lamoTRIgine   25 mg Oral BID   sertraline   100 mg Oral Daily   sodium chloride  flush  3 mL Intravenous Q12H   Continuous Infusions:  heparin  1,100 Units/hr (02/20/24 0544)   PRN Meds: acetaminophen  **OR** acetaminophen , alum & mag hydroxide-simeth, hydrALAZINE    Vital Signs    Vitals:   02/19/24 1934 02/19/24 2324 02/20/24 0317 02/20/24 0439  BP: 112/68 123/73 128/69   Pulse: 71 72 (!) 104   Resp: (!) 24 20 20 20   Temp: 98.1 F (36.7 C) 98 F (36.7 C) 98.3 F (36.8 C)   TempSrc: Oral Oral Oral   SpO2: 99% 96% 97%   Weight:    125 kg  Height:        Intake/Output Summary (Last 24 hours) at 02/20/2024 0945 Last data filed at 02/20/2024 0544 Gross per 24 hour  Intake 365.64 ml  Output 2550 ml  Net -2184.36 ml      02/20/2024    4:39 AM 02/19/2024    6:04 AM 02/17/2024    2:57 PM  Last 3 Weights  Weight (lbs) 275 lb 8 oz 284 lb 6.3 oz 277 lb  Weight (kg) 124.966 kg 129 kg 125.646 kg     Telemetry    NSR - Personally Reviewed  Physical Exam   GEN: No acute distress.  HEENT: Normocephalic,  atraumatic, sclera non-icteric. Neck: No JVD or bruits. Cardiac: RRR no murmurs, rubs, or gallops.  Respiratory: Clear to auscultation bilaterally. Breathing is unlabored. GI: Soft, nontender, non-distended, BS +x 4. MS: no deformity. Extremities: No clubbing or cyanosis. No edema. Distal pedal pulses are 2+ and equal bilaterally. Neuro:  AAOx3. Follows commands. Psych:  Responds to questions appropriately with a normal affect.  Labs    High Sensitivity Troponin:   Recent Labs  Lab 02/17/24 1610 02/17/24 1755 02/18/24 1710 02/18/24 2007  TROPONINIHS 26* 29* 21* 19*      Cardiac EnzymesNo results for input(s): TROPONINI in the last 168 hours. No results for input(s): TROPIPOC in the last 168 hours.   Chemistry Recent Labs  Lab 02/17/24 1610 02/19/24 0342 02/20/24 0343  NA 141 138 139  K 4.5 4.4 4.0  CL 110 100 99  CO2 21* 26 27  GLUCOSE 100* 97 98  BUN 29* 30* 41*  CREATININE 1.61* 1.83* 2.12*  CALCIUM 9.1 8.6* 8.4*  PROT 6.4*  --  6.0*  ALBUMIN 3.3*  --  3.1*  AST 23  --  17  ALT 24  --  16  ALKPHOS 76  --  67  BILITOT 0.7  --  0.7  GFRNONAA 37* 32* 27*  ANIONGAP 10 12 13      Hematology Recent Labs  Lab 02/17/24 1610 02/20/24 0343  WBC 6.1 7.3  RBC 4.49 4.64  HGB 11.2* 11.5*  HCT 35.6* 36.2  MCV 79.3* 78.0*  MCH 24.9* 24.8*  MCHC 31.5 31.8  RDW 18.5* 18.2*  PLT 235 240    BNP Recent Labs  Lab 02/17/24 1610  BNP 856.6*     DDimer No results for input(s): DDIMER in the last 168 hours.   Radiology    ECHOCARDIOGRAM COMPLETE Result Date: 02/20/2024    ECHOCARDIOGRAM REPORT   Patient Name:   RAPHAELA CANNADAY Date of Exam: 02/19/2024 Medical Rec #:  [id number]     Height:       64.0 in Accession #:    [id number]    Weight:       284.4 lb Date of Birth:  02-03-1966      BSA:          2.272 m Patient Age:    58 years      BP:           113/70 mmHg Patient Gender: F             HR:           75 bpm. Exam Location:  Inpatient Procedure: 2D Echo,  Cardiac Doppler, Color Doppler and Intracardiac            Opacification Agent (Both Spectral and Color Flow Doppler were            utilized during procedure). Indications:    CHF I50.9  History:        Patient has prior history of Echocardiogram examinations, most                 recent 12/15/2020. Cardiomyopathy and CHF; Risk                 Factors:Hypertension.  Sonographer:    Thea Norlander RCS Referring Phys: [id number] ZANE ADAMS IMPRESSIONS  1. Left ventricular ejection fraction, by estimation, is 40 to 45%. The left ventricle has mildly decreased function. The left ventricle demonstrates global hypokinesis. There is mild left ventricular hypertrophy. Elevated left ventricular end-diastolic  pressure.  2. Right ventricular systolic function is normal. The right ventricular size is normal. There is mildly elevated pulmonary artery systolic pressure.  3. Left atrial size was severely dilated.  4. Right atrial size was moderately dilated.  5. Consider TEE to better evaluate mitral regurgitation. The mitral valve is normal in structure. Moderate to severe mitral valve regurgitation. No evidence of mitral stenosis.  6. The aortic valve is tricuspid. Aortic valve regurgitation is not visualized. No aortic stenosis is present.  7. The inferior vena cava is normal in size with greater than 50% respiratory variability, suggesting right atrial pressure of 3 mmHg. FINDINGS  Left Ventricle: Left ventricular ejection fraction, by estimation, is 40 to 45%. The left ventricle has mildly decreased function. The left ventricle demonstrates global hypokinesis. Definity  contrast agent was given IV to delineate the left ventricular  endocardial borders. The left ventricular internal cavity size was normal in size. There is mild left ventricular hypertrophy. Left ventricular diastolic function could not be evaluated due to mitral regurgitation (moderate or greater). Elevated left ventricular end-diastolic pressure. Right  Ventricle: The right ventricular size is normal. No  increase in right ventricular wall thickness. Right ventricular systolic function is normal. There is mildly elevated pulmonary artery systolic pressure. The tricuspid regurgitant velocity is 2.51  m/s, and with an assumed right atrial pressure of 15 mmHg, the estimated right ventricular systolic pressure is 40.2 mmHg. Left Atrium: Left atrial size was severely dilated. Right Atrium: Right atrial size was moderately dilated. Pericardium: There is no evidence of pericardial effusion. Mitral Valve: Consider TEE to better evaluate mitral regurgitation. The mitral valve is normal in structure. Moderate to severe mitral valve regurgitation. No evidence of mitral valve stenosis. Tricuspid Valve: The tricuspid valve is normal in structure. Tricuspid valve regurgitation is mild . No evidence of tricuspid stenosis. Aortic Valve: The aortic valve is tricuspid. Aortic valve regurgitation is not visualized. No aortic stenosis is present. Aortic valve peak gradient measures 10.0 mmHg. Pulmonic Valve: The pulmonic valve was normal in structure. Pulmonic valve regurgitation is mild. No evidence of pulmonic stenosis. Aorta: The aortic root is normal in size and structure. Venous: The inferior vena cava is normal in size with greater than 50% respiratory variability, suggesting right atrial pressure of 3 mmHg. IAS/Shunts: No atrial level shunt detected by color flow Doppler.  LEFT VENTRICLE PLAX 2D LVIDd:         5.90 cm   Diastology LVIDs:         4.40 cm   LV e' medial:    6.42 cm/s LV PW:         1.20 cm   LV E/e' medial:  17.4 LV IVS:        0.90 cm   LV e' lateral:   10.00 cm/s LVOT diam:     1.93 cm   LV E/e' lateral: 11.2 LV SV:         60 LV SV Index:   27 LVOT Area:     2.94 cm  RIGHT VENTRICLE            IVC RV S prime:     5.85 cm/s  IVC diam: 3.00 cm LEFT ATRIUM            Index        RIGHT ATRIUM           Index LA diam:      4.60 cm  2.02 cm/m   RA Area:     23.85  cm LA Vol (A2C): 84.2 ml  37.06 ml/m  RA Volume:   76.05 ml  33.47 ml/m LA Vol (A4C): 123.0 ml 54.13 ml/m  AORTIC VALVE AV Area (Vmax): 2.06 cm AV Vmax:        158.00 cm/s AV Peak Grad:   10.0 mmHg LVOT Vmax:      111.00 cm/s LVOT Vmean:     74.400 cm/s LVOT VTI:       0.206 m  AORTA Ao Root diam: 2.70 cm Ao Asc diam:  2.90 cm MITRAL VALVE                TRICUSPID VALVE MV Area (PHT): 4.60 cm     TR Peak grad:   25.2 mmHg MV Decel Time: 165 msec     TR Vmax:        251.00 cm/s MV E velocity: 112.00 cm/s MV A velocity: 38.80 cm/s   SHUNTS MV E/A ratio:  2.89         Systemic VTI:  0.21 m  Systemic Diam: 1.93 cm Annabella Scarce MD Electronically signed by Annabella Scarce MD Signature Date/Time: 02/20/2024/7:10:27 AM    Final     Cardiac Studies   2d echo 02/19/24    1. Left ventricular ejection fraction, by estimation, is 40 to 45%. The  left ventricle has mildly decreased function. The left ventricle  demonstrates global hypokinesis. There is mild left ventricular  hypertrophy. Elevated left ventricular end-diastolic   pressure.   2. Right ventricular systolic function is normal. The right ventricular  size is normal. There is mildly elevated pulmonary artery systolic  pressure.   3. Left atrial size was severely dilated.   4. Right atrial size was moderately dilated.   5. Consider TEE to better evaluate mitral regurgitation. The mitral valve  is normal in structure. Moderate to severe mitral valve regurgitation. No  evidence of mitral stenosis.   6. The aortic valve is tricuspid. Aortic valve regurgitation is not  visualized. No aortic stenosis is present.   7. The inferior vena cava is normal in size with greater than 50%  respiratory variability, suggesting right atrial pressure of 3 mmHg.   Patient Profile     58 y.o. female with  anxiety, agoraphobia, hypertension, nonischemic cardiomyopathy, chronic HFimpEF, morbid obesity, CKD ?stage IV. Dx with NICM  EF 25% in 2016 with normal coronaries. EF in 2022 was 60-65%. Admitted with worsening CP, SOB, abdominal pain/swelling, nausea, vomiting, diaphoresis, orthopnea. Admitted with a/c CHF and also noted to have atrial flutter. Recent Cr 01/2024 was 2.1 (last 1.21 in 2021), admitted here at 1.61, also mildly anemic with neg FOBT.  Assessment & Plan    1.Dyspnea, chest pain, SOB, abdominal pain - initially felt to have acute on chronic CHF - EF 40-45% so now HFmrEF with moderate-severe mitral regurgitation - variable EF over the years as above, here at 40-45% with global HK, moderate-severe mitral regurgitation - initiated on carvedilol  6.25mg  BID, IV Lasix  with -5.2L out and improvement in SOB, variable weights - now experiencing what sounds like orthostasis. BP cuff is undersized and on wrist so asked for recheck with large cuff on upper arm for reassessment to confirm - already got IV Lasix , carvedilol  this AM so will hold and discuss further plans with MD - does not look overtly volume up today - per preliminary discussion with Dr. Santo, anticipate medical management of valvular disease for now unless the orthostasis today changes that recommendation  2. Low level elevated troponin - low/flat not felt to reflect ACS - patient has not been eager to pursue cath due to prior painful femoral approach - prior cath 2016 with normal coronaries - LDL 98 not on lipid therapy  3. Recent progressive renal dysfunction, unclear if this is AKI on CKD stage IIIa versus progression to CKD stage IV - appreciate further medical workup per primary team - UA with ketonuria but no significant proteinuria - will obtain renal US  - continue to follow  4. Paroxysmal atrial flutter - reported to have had AFL from 1900 to 2300 day of consultation, telemetry tracings also with possible Afib as well - currently maintaining NSR - on heparin  for now pending completion of all testing  4. Mild microcytic anemia -  FOBT negative, no bleeding reported - check anemia panel in AM - if iron deficient would recommend supplementation  5. Morbid obesity - consider sleep study as OP if not previously pursued  6. QT prolongation - difficult to ascertain on telemetry this AM, will obtain f/u EKG today -  Mg 1.8, K 4.0 - will give 1 dose MagOx given renal dysfunction and follow in AM - psychiatric regimen per primary team - sertraline  can be an offending agent   Addendum: f/u BP with large cuff confirms BP 90/50 - EKG shows NSR with nonspecific TW changes, QTC remains but improved. Will send msg to IM whether sertraline  can be adjusted at their discretion. F/u EKG ordered for AM. Magnesium  plan above.  For questions or updates, please contact [facility] Please consult [email or web address] for contact info under Cardiology/STEMI.  Signed, Dayna N Dunn, PA-C 02/20/2024, 9:45 AM    I have personally evaluated and examined the patient. The history, physical exam, and medical decision making documented below were performed independently and substantively by me. I have reviewed all relevant data, formulated the assessment and plan, and assumed responsibility for the management of this patient. My documentation reflects the substantive portion of the split/shared visit, in accordance with CMS and CPT guidelines. I have updated the NPP documentation above as appropriate.  I have personally performed the substantive portion of the medical decision making, including interpretation of diagnostic data, formulation of the management plan, and assessment of risks. In summation:  Ms. Wheelwright is a 58 year old with heart failure with reduced ejection fraction and super morbid obesity who this morning notes dizziness.  She experienced orthostatic hypotension this morning, feeling too dizzy to stand. Her blood pressure readings were lower than normal though with a small cuff. She feels better when lying down and has no  symptoms in that position when nursing and I saw her later in the AM.  Echo from 9/18 showed worsening heart failure with reduced ejection fraction and significant mitral regurgitation. She reports experiencing no further dyspnea. She underwent a strong diuresis with a negative fluid balance of 5.2 liters, which has alleviated her shortness of breath.  She has a history of paroxysmal atrial flutter  and is currently in sinus rhythm. She is asymptomatic when in this rhythm  Exam notable for  Gen: no distress, super morbid obesity  Neck: No JVD Cardiac: No Rubs or Gallops, holosystolic murmur, distant heart sounds RRR +2 radial pulses Respiratory: Clear to auscultation bilaterally, normal effort, normal  respiratory rate GI: Soft, nontender, non-distended  MS: No  edema;  moves all extremities Integument: Skin feels warm Neuro:  At time of evaluation, alert and oriented to person/place/time/situation  Psych: Normal affect, patient feels ok  Personally reviewed data and interpretation:   EKG: QTc of 503 ms (02/19/2024) Tele: Bi-atrial enlargement SR with out further AFL  In assessment and plan:   Acute on chronic Heart failure with reduced ejection fraction and moderate to severe functional mitral regurgitation - Multifactorial dyspnea with new heart failure with reduced ejection fraction in the setting of moderate to severe mitral regurgitation, likely functional in nature. Currently in sinus rhythm. Strong diuresis with a negative fluid balance of 5.2 liters and no longer short of breath. Concerns about super morbid obesity affecting the use of SGLT2 inhibitors. Patient deferred right heart catheterization and transesophageal echocardiogram due to significant allodynia and past pain experience with left heart catheterization. - Hold carvedilol  today - Discharge on carvedilol  3.25 mg BID - Start spironolactone  12.5 mg PO daily tomorrow - Start Lasix  20 mg PO daily tomorrow - Defer SGLT2  inhibitor to outpatient - Defer ARB, ACE, or ARNI to outpatient - Re-evaluate right heart catheterization and transesophageal echocardiogram as outpatient with Dr. Tyree team  Orthostatic hypotension - Orthostatic hypotension  with dizziness upon standing. Blood pressure lower than normal with a small cuff. No symptoms when lying down. Uncomfortable sending her home on ARB, ACE, or ARNI due to hypotension.  AFL - heparin  to DOAC today  AKI vs CKD unclear stage - Renal US  today, holding further diuresis for now  Super morbid obesity Super morbid obesity is a concern for the use of SGLT2 inhibitors due to potential poor outcomes.  Long term GLP-1 Therapy would be useful - no history of MEN or medullary thyroid cancer - no history of pancreatitis or gallstones. - needs future sleep study    Stanly Leavens, MD FASE [facility] Cardiologist [facility]  [address] [place], [address] [place], [place] [address] [phone]  11:20 AM

## 2024-02-20 NOTE — Progress Notes (Signed)
 PHARMACY - ANTICOAGULATION CONSULT NOTE  Pharmacy Consult for Heparin  Indication: atrial fibrillation  Allergies  Allergen Reactions   Sulfa Antibiotics Other (See Comments)    Childhood allergy with unknown reaction    Patient Measurements: Height: 5' 4 (162.6 cm) Weight: 129 kg (284 lb 6.3 oz) IBW/kg (Calculated) : 54.7 HEPARIN  DW (KG): 85.6  Vital Signs: Temp: 98.3 F (36.8 C) (09/19 0317) Temp Source: Oral (09/19 0317) BP: 128/69 (09/19 0317) Pulse Rate: 104 (09/19 0317)  Labs: Recent Labs    02/17/24 1610 02/17/24 1755 02/18/24 1710 02/18/24 2007 02/19/24 0342 02/20/24 0343  HGB 11.2*  --   --   --   --  11.5*  HCT 35.6*  --   --   --   --  36.2  PLT 235  --   --   --   --  240  HEPARINUNFRC  --   --   --   --   --  0.47  CREATININE 1.61*  --   --   --  1.83*  --   TROPONINIHS 26* 29* 21* 19*  --   --     Estimated Creatinine Clearance: 45.2 mL/min (A) (by C-G formula based on SCr of 1.83 mg/dL (H)).   Medical History: Past Medical History:  Diagnosis Date   Anxiety    Chronic systolic heart failure (HCC)    Depression    HTN (hypertension)    Microcytic anemia    NICM (nonischemic cardiomyopathy) (HCC) 11/2014   EF is 25% improved to 55% in 2015.   Pre-diabetes     Medications:  Scheduled:   ARIPiprazole   5 mg Oral Daily   busPIRone   10 mg Oral BID   carvedilol   6.25 mg Oral BID WC   fluticasone   2 spray Each Nare Daily   furosemide   80 mg Intravenous BID   lamoTRIgine   25 mg Oral BID   sertraline   100 mg Oral Daily   sodium chloride  flush  3 mL Intravenous Q12H   Infusions:   heparin  1,100 Units/hr (02/20/24 0002)   PRN: acetaminophen  **OR** acetaminophen , alum & mag hydroxide-simeth, hydrALAZINE   Assessment: 58 yo female admitted with acute on chronic HF. Pharmacy consulted to dose IV heparin  for new onset aflutter. Not on AC PTA  Received dose of Lovenox  40mg  at 15:16  Hgb 11.2 Plts 235  Heparin  level came back therapeutic  tonight.  Goal of Therapy:  Heparin  level 0.3-0.7 units/ml Monitor platelets by anticoagulation protocol: Yes   Plan:  Continue heparin  at 1100 units/hr Check 6 hr confirm level  Sergio Batch, PharmD, BCIDP, AAHIVP, CPP Infectious Disease Pharmacist 02/20/2024 4:37 AM

## 2024-02-20 NOTE — Progress Notes (Signed)
 PROGRESS NOTE Abigail Sharp  FMW:[id number] DOB: September 02, 1965 DOA: 02/17/2024 PCP: Pcp, No  Brief Narrative/Hospital Course: Abigail Sharp is a 58 y.o. female with PMH of hypertension, HFpEF, anxiety presenting with chest pain and shortness of breath.symoptoms going on for months aand worse with activity, some orthopnea. She gets pain all over on walking. Never had sleep apnea. She also c/o chest pain when she wakes up as she cannont breather and feels lie  heart coming out of chest  reports having cath in 2016 and no stent needed. She also c/o nasal stuffiness, cough for sometime. She has lots of complaints feeling hot. [name]  has been having nausea. Patient otherwise denies any nausea, vomiting,fever, chills, headache, focal weakness, numbness tingling, speech difficulties.  She endorses weight gain was 277 lb on 8/1 and repeat pending. In the ED:BP poorly controlled 160/110, saturating 90 to 97% on room air, heart rate 102 afebrile somewhat tachypneic in 20s EKG without STEMI.  Hemoglobin 11.2, FOBT negative, creatinine 1.6 (stable), troponin 26 > 29, BNP 856.  UA STABLE Chest x-ray showing vascular congestion and possible tiny pleural effusions.  Patient was given aspirin , IV Lasix  40 mg, and morphine  and admission was requested for CHF exacerbation  Subjective: Seen and examined She reports she feels better in terms of breathing able to sit up more although some dizziness on sitting up Difficulty with taking BP-inconsistent, trying to get correct cuff. Overnight afebrile BP stable although slightly soft Labs shows creatinine up to 2.1 from 1.8 stable CBC  Assessment and plan:  Acute on chronic CHF with improved EF HX of NICM: Recently seen by Dr. LELON on 8/1 and euvolemic.  Has not been on heart failure medication for months and was placed on Coreg  Aldactone  along with repeat echo-patient reports she has not started taking any meds-just got them prior to admission, weight when in August was  277 pounds on admission to 284 lb Cardio consulted, continue GDMT IV diuretics 80 bid 9/18 per cardio> with BP being soft holding off on IV diuretics TTE :EF 40-45%, GHNK LV ,RV SF normal-consider TEE to evaluate mild MR, ast EF 60 to 65% 2022 in July from 25% 2016 Cont to monitor daily I/O,weight, electrolytes and net balance as below.Keep on  salt/fluid restricted diet and monitor in tele. Net IO Since Admission: -5,234.36 mL [02/20/24 1114]  Filed Weights   02/17/24 1457 02/19/24 0604 02/20/24 0439  Weight: 125.6 kg 129 kg 125 kg    Recent Labs  Lab 02/17/24 1610 02/19/24 0342 02/20/24 0343  BNP 856.6*  --   --   BUN 29* 30* 41*  CREATININE 1.61* 1.83* 2.12*  K 4.5 4.4 4.0  MG  --   --  1.8   AKI on CKD 3B versus 4: Last creatinine in August was 2.1 although previously up to 1.2.  Monitor.  Monitor Recent Labs    01/02/24 1504 01/02/24 2353 02/17/24 1610 02/19/24 0342 02/20/24 0343  BUN 39*  --  29* 30* 41*  CREATININE 2.18*  --  1.61* 1.83* 2.12*  CO2 21*  --  21* 26 27  K 4.8 4.6 4.5 4.4 4.0    New onset Atrial flutter: Patient having a flutter, now on heparin  drip, Coreg .  TSH normal  Hypertension with diabetes: BP soft stable.  Elevated troponin flat likely due to demand ischemia: EKG nonischemic.   Microcytic anemia Hemoglobin stable at 11 g.  Anxiety/depression/OCD: Mood stable. Cont home meds.  Patient reports being nonadherent to the meds contributing  to her symptoms it seems. On Abilify , BuSpar  Lamictal  Zoloft .  Consulted psych Zoloft  dose decreased   Morbid Obesity w/ Body mass index is 47.29 kg/m.: Will benefit with PCP follow-up, weight loss,healthy lifestyle and outpatient sleep eval if not done.  Mobility: PT Orders:  PT Follow up Rec: Home Health Pt9/18/2025 1545   DVT prophylaxis:  Code Status:   Code Status: Full Code Family Communication: plan of care discussed with patient at bedside. Patient status is: Remains hospitalized because of  severity of illness Level of care: Telemetry Cardiac   Dispo: The patient is from: home            Anticipated disposition: TBD Objective: Vitals last 24 hrs: Vitals:   02/20/24 0317 02/20/24 0439 02/20/24 1040 02/20/24 1059  BP: 128/69  (!) 87/62 (!) 90/50  Pulse: (!) 104  63 62  Resp: 20 20 16    Temp: 98.3 F (36.8 C)     TempSrc: Oral     SpO2: 97%  100%   Weight:  125 kg    Height:        Physical Examination: General exam: AAOX3 HEENT:Oral mucosa moist, Ear/Nose WNL grossly Respiratory system: Bilaterally clear BS,no use of accessory muscle Cardiovascular system: S1 & S2 +, No JVD. Gastrointestinal system: Abdomen soft,NT,ND, BS+ Nervous System: Alert, awake, moving all extremities,and following commands. Extremities: extremities warm, leg edema + Skin: No rashes,no icterus. MSK: Normal muscle bulk,tone, power   Medications reviewed:  Scheduled Meds:  ARIPiprazole   5 mg Oral Daily   busPIRone   10 mg Oral BID   fluticasone   2 spray Each Nare Daily   lamoTRIgine   25 mg Oral BID   sertraline   100 mg Oral Daily   sodium chloride  flush  3 mL Intravenous Q12H   Continuous Infusions:  heparin  1,100 Units/hr (02/20/24 0544)   Diet: Diet Order             Diet Heart Room service appropriate? Yes with Assist; Fluid consistency: Thin  Diet effective now                    Data Reviewed: I have personally reviewed following labs and imaging studies ( see epic result tab) CBC: Recent Labs  Lab 02/17/24 1610 02/20/24 0343  WBC 6.1 7.3  HGB 11.2* 11.5*  HCT 35.6* 36.2  MCV 79.3* 78.0*  PLT 235 240   CMP: Recent Labs  Lab 02/17/24 1610 02/19/24 0342 02/20/24 0343  NA 141 138 139  K 4.5 4.4 4.0  CL 110 100 99  CO2 21* 26 27  GLUCOSE 100* 97 98  BUN 29* 30* 41*  CREATININE 1.61* 1.83* 2.12*  CALCIUM 9.1 8.6* 8.4*  MG  --   --  1.8   GFR: Estimated Creatinine Clearance: 38.3 mL/min (A) (by C-G formula based on SCr of 2.12 mg/dL (H)). Recent Labs   Lab 02/17/24 1610 02/20/24 0343  AST 23 17  ALT 24 16  ALKPHOS 76 67  BILITOT 0.7 0.7  PROT 6.4* 6.0*  ALBUMIN 3.3* 3.1*   No results for input(s): LIPASE, AMYLASE in the last 168 hours. No results for input(s): AMMONIA in the last 168 hours. Coagulation Profile: No results for input(s): INR, PROTIME in the last 168 hours. Unresulted Labs (From admission, onward)     Start     Ordered   02/21/24 0500  Heparin  level (unfractionated)  Daily,   R     Question:  Specimen collection method  Answer:  Lab=Lab collect   02/19/24 1615   02/21/24 0500  Vitamin B12  (Anemia Panel (PNL))  Tomorrow morning,   R       Question:  Specimen collection method  Answer:  Lab=Lab collect   02/20/24 0951   02/21/24 0500  Folate  (Anemia Panel (PNL))  Tomorrow morning,   R       Question:  Specimen collection method  Answer:  Lab=Lab collect   02/20/24 0951   02/21/24 0500  Iron and TIBC  (Anemia Panel (PNL))  Tomorrow morning,   R       Question:  Specimen collection method  Answer:  Lab=Lab collect   02/20/24 0951   02/21/24 0500  Ferritin  (Anemia Panel (PNL))  Tomorrow morning,   R       Question:  Specimen collection method  Answer:  Lab=Lab collect   02/20/24 0951   02/21/24 0500  Reticulocytes  (Anemia Panel (PNL))  Tomorrow morning,   R       Question:  Specimen collection method  Answer:  Lab=Lab collect   02/20/24 0951   02/21/24 0500  Magnesium   Tomorrow morning,   R       Question:  Specimen collection method  Answer:  Lab=Lab collect   02/20/24 1009   02/20/24 0500  CBC  Daily,   R     Question:  Specimen collection method  Answer:  Lab=Lab collect   02/19/24 1615   02/20/24 0500  Comprehensive metabolic panel  Daily,   R     Question:  Specimen collection method  Answer:  Lab=Lab collect   02/19/24 1624   02/18/24 1442  Creatinine clearance, urine, 24 hour  (Creatinine Clearance, urine, 24-hour panel)  Once,   R        02/18/24 1441   02/18/24 1442  Draw extra clot tube   (Creatinine Clearance, urine, 24-hour panel)  Once,   R        02/18/24 1441           Antimicrobials/Microbiology: Anti-infectives (From admission, onward)    None         Component Value Date/Time   SDES URINE, CLEAN CATCH 01/03/2024 0025   SPECREQUEST  01/03/2024 0025    NONE Performed at [facility], [address] [place], [place] [address]    CULT 70,000 COLONIES/mL ESCHERICHIA COLI (A) 01/03/2024 0025   REPTSTATUS 01/05/2024 FINAL 01/03/2024 0025    Procedures:    Mennie LAMY, MD [facility] 02/20/2024, 11:14 AM

## 2024-02-20 NOTE — Evaluation (Signed)
 Occupational Therapy Evaluation Patient Details Name: Abigail Sharp MRN: [id number] DOB: 17-Mar-1966 Today's Date: 02/20/2024   History of Present Illness   Pt is 58 yo presenting to [facility] on 9/16 due to acute heart failure on chronic. MH: CHF, anxiety/depression, agoraphobia, HTN, nonischemic cardiomyopathy, and HFimpEF.     Clinical Impressions Pt admitted based on above, and was seen based on problem list below. Pt reporting independence with ADLs PTA. Today pt is requiring set up  to mod  assist for ADLs. Limited eval d/t pt likely orthostatic, soft BP in sitting and supine (see general comments for details). Pt with preference to d/c home with HHOT. OT will continue to follow acutely to maximize functional independence.        If plan is discharge home, recommend the following:   A little help with walking and/or transfers;A lot of help with bathing/dressing/bathroom     Functional Status Assessment   Patient has had a recent decline in their functional status and demonstrates the ability to make significant improvements in function in a reasonable and predictable amount of time.     Equipment Recommendations   BSC/3in1      Precautions/Restrictions   Precautions Precautions: Fall Recall of Precautions/Restrictions: Impaired Restrictions Weight Bearing Restrictions Per Provider Order: No     Mobility Bed Mobility Overal bed mobility: Modified Independent     General bed mobility comments: Received sitting EOB, no assist to return to supine    Transfers   General transfer comment: Deferred      Balance Overall balance assessment: Needs assistance Sitting-balance support: Single extremity supported, Feet supported Sitting balance-Leahy Scale: Good       ADL either performed or assessed with clinical judgement   ADL Overall ADL's : Needs assistance/impaired Eating/Feeding: Set up;Sitting   Grooming: Set up;Sitting           Upper Body  Dressing : Set up;Sitting   Lower Body Dressing: Moderate assistance;Sitting/lateral leans   General ADL Comments: Eval limited d/t pt potentially orthostatic. Reporting dizziness     Vision Baseline Vision/History: 0 No visual deficits Vision Assessment?: No apparent visual deficits            Pertinent Vitals/Pain Pain Assessment Pain Assessment: Faces Faces Pain Scale: Hurts little more Pain Location: bil knees Pain Descriptors / Indicators: Aching Pain Intervention(s): Limited activity within patient's tolerance     Extremity/Trunk Assessment Upper Extremity Assessment Upper Extremity Assessment: Generalized weakness   Lower Extremity Assessment Lower Extremity Assessment: Defer to PT evaluation   Cervical / Trunk Assessment Cervical / Trunk Assessment: Normal   Communication Communication Communication: No apparent difficulties   Cognition Arousal: Alert Behavior During Therapy: WFL for tasks assessed/performed Cognition: No family/caregiver present to determine baseline, Cognition impaired     Awareness: Online awareness impaired Memory impairment (select all impairments): Short-term memory, Working memory Attention impairment (select first level of impairment): Sustained attention Executive functioning impairment (select all impairments): Problem solving, Reasoning OT - Cognition Comments: Pt reporting conflicting history with PLOF, tangiental       Following commands: Intact       Cueing  General Comments   Cueing Techniques: Verbal cues  Pt completed a lateral scoot and became light headed BP recorded 90/61 (69). Pt requesting to return to supine. After 3 minutes BP reading 83/55 964), after muscle contraction exercises BP 91/47 (60). RN and MD notified           Home Living Family/patient expects to be discharged to:: Private residence  Living Arrangements: Alone Available Help at Discharge: Family;Available PRN/intermittently Type of Home:  Other(Comment) (Townhome) Home Access: Stairs to enter Entrance Stairs-Number of Steps: 1 Entrance Stairs-Rails: None Home Layout: Other (Comment)     Bathroom Shower/Tub: Chief Strategy Officer: Standard Bathroom Accessibility: Yes How Accessible: Accessible via walker Home Equipment: Rollator (4 wheels)          Prior Functioning/Environment Prior Level of Function : Independent/Modified Independent;Patient poor historian/Family not available       Mobility Comments: Pt reports only x1 fall in last 6 months ADLs Comments: Pt reports Mod I with increased time and effort, only wears slip on shoes at baseline    OT Problem List: Decreased strength;Decreased range of motion;Impaired balance (sitting and/or standing);Decreased activity tolerance;Decreased safety awareness;Decreased knowledge of use of DME or AE;Cardiopulmonary status limiting activity   OT Treatment/Interventions: Self-care/ADL training;Therapeutic exercise;Energy conservation;DME and/or AE instruction;Therapeutic activities;Patient/family education;Balance training      OT Goals(Current goals can be found in the care plan section)   Acute Rehab OT Goals Patient Stated Goal: To get better OT Goal Formulation: With patient Time For Goal Achievement: 03/05/24 Potential to Achieve Goals: Good   OT Frequency:  Min 2X/week       AM-PAC OT 6 Clicks Daily Activity     Outcome Measure Help from another person eating meals?: None Help from another person taking care of personal grooming?: A Little Help from another person toileting, which includes using toliet, bedpan, or urinal?: A Lot Help from another person bathing (including washing, rinsing, drying)?: A Lot Help from another person to put on and taking off regular upper body clothing?: A Little Help from another person to put on and taking off regular lower body clothing?: A Lot 6 Click Score: 16   End of Session Equipment Utilized During  Treatment: Gait belt Nurse Communication: Mobility status  Activity Tolerance: Patient limited by fatigue Patient left: in bed;with call bell/phone within reach;with bed alarm set  OT Visit Diagnosis: Unsteadiness on feet (R26.81);Other abnormalities of gait and mobility (R26.89);Repeated falls (R29.6);Muscle weakness (generalized) (M62.81);History of falling (Z91.81)                Time: 9066-9051 OT Time Calculation (min): 15 min Charges:  OT General Charges $OT Visit: 1 Visit OT Evaluation $OT Eval Low Complexity: 1 Low  Adrianne BROCKS, OT  Acute Rehabilitation Services Office [phone] Secure chat preferred   Adrianne GORMAN [employer]vers 02/20/2024, 10:17 AM

## 2024-02-20 NOTE — Progress Notes (Signed)
 PHARMACY - ANTICOAGULATION CONSULT NOTE  Pharmacy Consult for Heparin  Indication: atrial fibrillation  Allergies  Allergen Reactions   Sulfa Antibiotics Other (See Comments)    Childhood allergy with unknown reaction    Patient Measurements: Height: 5' 4 (162.6 cm) Weight: 125 kg (275 lb 8 oz) IBW/kg (Calculated) : 54.7 HEPARIN  DW (KG): 85.6  Vital Signs: Temp: 98.3 F (36.8 C) (09/19 0317) Temp Source: Oral (09/19 0317) BP: 90/50 (09/19 1059) Pulse Rate: 62 (09/19 1059)  Labs: Recent Labs    02/17/24 1610 02/17/24 1755 02/18/24 1710 02/18/24 2007 02/19/24 0342 02/20/24 0343 02/20/24 0933  HGB 11.2*  --   --   --   --  11.5*  --   HCT 35.6*  --   --   --   --  36.2  --   PLT 235  --   --   --   --  240  --   HEPARINUNFRC  --   --   --   --   --  0.47 0.49  CREATININE 1.61*  --   --   --  1.83* 2.12*  --   TROPONINIHS 26* 29* 21* 19*  --   --   --     Estimated Creatinine Clearance: 38.3 mL/min (A) (by C-G formula based on SCr of 2.12 mg/dL (H)).   Medical History: Past Medical History:  Diagnosis Date   Anxiety    Chronic systolic heart failure (HCC)    Depression    HTN (hypertension)    Microcytic anemia    NICM (nonischemic cardiomyopathy) (HCC) 11/2014   EF is 25% improved to 55% in 2015.   Pre-diabetes     Medications:  Scheduled:   ARIPiprazole   5 mg Oral Daily   busPIRone   10 mg Oral BID   fluticasone   2 spray Each Nare Daily   lamoTRIgine   25 mg Oral BID   [START ON 02/21/2024] sertraline   50 mg Oral Daily   sodium chloride  flush  3 mL Intravenous Q12H   Infusions:   heparin  1,100 Units/hr (02/20/24 0544)   PRN: acetaminophen  **OR** acetaminophen , alum & mag hydroxide-simeth, hydrALAZINE   Assessment: 58 yo female admitted with acute on chronic HF. Pharmacy consulted to dose IV heparin  for new onset aflutter. Not on [facility] PTA  Cardilolgy now wishes to transition to apixaban . Copay for apixaban  is $4/month  Hgb 11.5 Plts 240   Goal  of Therapy:  Heparin  level 0.3-0.7 units/ml Monitor platelets by anticoagulation protocol: Yes   Plan:  D/C heparin  drip and start Apixaban  5mg  BID   Phoenyx Melka A. Lyle, PharmD, BCPS, FNKF Clinical Pharmacist [place] Please utilize Amion for appropriate phone number to reach the unit pharmacist [facility])

## 2024-02-20 NOTE — Telephone Encounter (Signed)
 Pharmacy Patient Advocate Encounter  Insurance verification completed.    The patient is insured through [place].     Ran test claim for Eliquis  5mg , Xarelto 15mg , Xarelto 20mg  and the current 30 day co-pay is $4.00.   This test claim was processed through [facility]- copay amounts may vary at other pharmacies due to pharmacy/plan contracts, or as the patient moves through the different stages of their insurance plan.

## 2024-02-21 DIAGNOSIS — I4581 Long QT syndrome: Secondary | ICD-10-CM | POA: Diagnosis not present

## 2024-02-21 DIAGNOSIS — I4892 Unspecified atrial flutter: Secondary | ICD-10-CM | POA: Diagnosis not present

## 2024-02-21 DIAGNOSIS — I5031 Acute diastolic (congestive) heart failure: Secondary | ICD-10-CM | POA: Diagnosis not present

## 2024-02-21 DIAGNOSIS — I509 Heart failure, unspecified: Secondary | ICD-10-CM | POA: Diagnosis not present

## 2024-02-21 LAB — IRON AND TIBC
Iron: 42 ug/dL (ref 28–170)
Saturation Ratios: 14 % (ref 10.4–31.8)
TIBC: 302 ug/dL (ref 250–450)
UIBC: 260 ug/dL

## 2024-02-21 LAB — COMPREHENSIVE METABOLIC PANEL WITH GFR
ALT: 15 U/L (ref 0–44)
AST: 16 U/L (ref 15–41)
Albumin: 3 g/dL — ABNORMAL LOW (ref 3.5–5.0)
Alkaline Phosphatase: 60 U/L (ref 38–126)
Anion gap: 11 (ref 5–15)
BUN: 36 mg/dL — ABNORMAL HIGH (ref 6–20)
CO2: 27 mmol/L (ref 22–32)
Calcium: 8.9 mg/dL (ref 8.9–10.3)
Chloride: 101 mmol/L (ref 98–111)
Creatinine, Ser: 1.92 mg/dL — ABNORMAL HIGH (ref 0.44–1.00)
GFR, Estimated: 30 mL/min — ABNORMAL LOW (ref >60)
Glucose, Bld: 75 mg/dL (ref 70–99)
Potassium: 4.4 mmol/L (ref 3.5–5.1)
Sodium: 139 mmol/L (ref 135–145)
Total Bilirubin: 0.5 mg/dL (ref 0.0–1.2)
Total Protein: 6 g/dL — ABNORMAL LOW (ref 6.5–8.1)

## 2024-02-21 LAB — RETICULOCYTES
Immature Retic Fract: 18.4 % — ABNORMAL HIGH (ref 2.3–15.9)
RBC.: 4.52 MIL/uL (ref 3.87–5.11)
Retic Count, Absolute: 91.8 K/uL (ref 19.0–186.0)
Retic Ct Pct: 2 % (ref 0.4–3.1)

## 2024-02-21 LAB — CBC
HCT: 36.3 % (ref 36.0–46.0)
Hemoglobin: 11.2 g/dL — ABNORMAL LOW (ref 12.0–15.0)
MCH: 24.4 pg — ABNORMAL LOW (ref 26.0–34.0)
MCHC: 30.9 g/dL (ref 30.0–36.0)
MCV: 79.1 fL — ABNORMAL LOW (ref 80.0–100.0)
Platelets: 261 K/uL (ref 150–400)
RBC: 4.59 MIL/uL (ref 3.87–5.11)
RDW: 18.4 % — ABNORMAL HIGH (ref 11.5–15.5)
WBC: 6.6 K/uL (ref 4.0–10.5)
nRBC: 0 % (ref 0.0–0.2)

## 2024-02-21 LAB — FOLATE: Folate: 8.5 ng/mL (ref >5.9)

## 2024-02-21 LAB — MAGNESIUM: Magnesium: 1.8 mg/dL (ref 1.7–2.4)

## 2024-02-21 LAB — VITAMIN B12: Vitamin B-12: 204 pg/mL (ref 180–914)

## 2024-02-21 LAB — FERRITIN: Ferritin: 23 ng/mL (ref 11–307)

## 2024-02-21 MED ORDER — OXYCODONE HCL 5 MG PO TABS
5.0000 mg | ORAL_TABLET | Freq: Once | ORAL | Status: AC | PRN
  Administered 2024-02-21: 5 mg via ORAL
  Filled 2024-02-21: qty 1

## 2024-02-21 NOTE — Progress Notes (Signed)
 PROGRESS NOTE Abigail Sharp  FMW:[id number] DOB: [birth year] DOA: 02/17/2024 PCP: Pcp, No  Brief Narrative/Hospital Course: Abigail Sharp is a 58 y.o. female with PMH of hypertension, HFpEF, anxiety presenting with chest pain and shortness of breath.symoptoms going on for months aand worse with activity, some orthopnea. She gets pain all over on walking. Never had sleep apnea. She also c/o chest pain when she wakes up as she cannont breather and feels lie  heart coming out of chest  reports having cath in 2016 and no stent needed. She also c/o nasal stuffiness, cough for sometime. She has lots of complaints feeling hot. [name]  has been having nausea. Patient otherwise denies any nausea, vomiting,fever, chills, headache, focal weakness, numbness tingling, speech difficulties.  She endorses weight gain was 277 lb on 8/1 and repeat pending. In the ED:BP poorly controlled 160/110, saturating 90 to 97% on room air, heart rate 102 afebrile somewhat tachypneic in 20s EKG without STEMI.  Hemoglobin 11.2, FOBT negative, creatinine 1.6 (stable), troponin 26 > 29, BNP 856.  UA STABLE Chest x-ray showing vascular congestion and possible tiny pleural effusions.  Patient was given aspirin , IV Lasix  40 mg, and morphine  and admission was requested for CHF exacerbation Cardiology was consulted   Subjective: Seen and examined She reports she is waking up, has no complaints.  She is on room air Patient had soft blood pressure 9/19 needing 500 cc bolus ns, continues to have intermittent chest pain Labs overall stable this morning creatinine at 1.9 from 2.1 BP in 100-1 20s  Assessment and plan:  Acute on chronic CHF with reduced ejection fraction and moderate to severe functional MR HX of NICM Chest pain Orthostatic hypotension Elevated troponin flat likely due to demand ischemia-EKG nonischemic: Recently seen by Dr. Michele on 8/1-has not been on heart failure medication for months and was placed on Coreg   Aldactone  along with repeat echo-patient reports she has not started taking any meds-just got them prior to admission, weight when in August was 277 pounds on admission to 284 lb Cardio consulted, continue GDMT IV diuretics 80 bid 9/18 per cardio> with BP orthostatic got 500 cc bolus 9/19. Lasix  on hold TTE :EF 40-45%, GHNK LV ,RV SF normal-moderate to severe MR Having intermittent chest pain. Discussed with cardiology plan for TEE Monday for MR. Cont to monitor daily I/O,weight, electrolytes  w/ salt/fluid restricted diet and monitor in tele  Net IO Since Admission: -6,614.36 mL [02/21/24 0952]  Filed Weights   02/19/24 0604 02/20/24 0439 02/21/24 0354  Weight: 129 kg 125 kg 124.9 kg    Recent Labs  Lab 02/17/24 1610 02/19/24 0342 02/20/24 0343 02/21/24 0348  BNP 856.6*  --   --   --   BUN 29* 30* 41* 36*  CREATININE 1.61* 1.83* 2.12* 1.92*  K 4.5 4.4 4.0 4.4  MG  --   --  1.8 1.8   AKI on CKD 3B vs CKD 4: Last creatinine in August was 2.1 although previously up to 1.2.  Only around 1.8-2, likely patient's baseline Recent Labs    01/02/24 1504 01/02/24 2353 02/17/24 1610 02/19/24 0342 02/20/24 0343 02/21/24 0348  BUN 39*  --  29* 30* 41* 36*  CREATININE 2.18*  --  1.61* 1.83* 2.12* 1.92*  CO2 21*  --  21* 26 27 27   K 4.8 4.6 4.5 4.4 4.0 4.4    New onset Atrial flutter: Continue Eliquis , off Coreg  due to hypotension.TSH normal  Prolonged Qtc: QTc 9/20 477 previously 503 on 9/19.  Celexa  dose has been decreased.  Monitor  Microcytic anemia Hemoglobin stable at 11 g.  Anxiety/depression/OCD: Mood stable. Cont home meds.  Patient reports being nonadherent to the meds contributing to her symptoms it seems. On multiple psych meds and resume per psychiatry, Zoloft  was decreased due to prolonged QTc, further adjusted based upon QTc   Morbid Obesity w/ Body mass index is 47.26 kg/m.: Will benefit with PCP follow-up, weight loss,healthy lifestyle and outpatient sleep eval  if not done.  Mobility: PT Orders:  PT Follow up Rec: Home Health Pt9/18/2025 1545   DVT prophylaxis:  Code Status:   Code Status: Full Code Family Communication: plan of care discussed with patient at bedside. Patient status is: Remains hospitalized because of severity of illness Level of care: Telemetry Cardiac   Dispo: The patient is from: home            Anticipated disposition: TBD Objective: Vitals last 24 hrs: Vitals:   02/21/24 0012 02/21/24 0354 02/21/24 0530 02/21/24 0755  BP: (!) 108/59 129/87 127/80 122/87  Pulse: 76 71 72 72  Resp: 17 16 14 20   Temp: 98.3 F (36.8 C) 98.3 F (36.8 C)  98.8 F (37.1 C)  TempSrc: Oral Oral  Oral  SpO2: 99% 98% 100% 97%  Weight:  124.9 kg    Height:        Physical Examination: General exam: AAOX3, pleasant, obese chronically sick looking HEENT:Oral mucosa moist, Ear/Nose WNL grossly Respiratory system: Bilaterally clear BS,no use of accessory muscle Cardiovascular system: S1 & S2 +, No JVD. Gastrointestinal system: Abdomen soft,NT,ND, BS+ Nervous System: Alert, awake, moving all extremities,and following commands. Extremities: extremities warm, leg edema + Skin: No rashes,no icterus. MSK: Normal muscle bulk,tone, power   Medications reviewed:  Scheduled Meds:  apixaban   5 mg Oral BID   ARIPiprazole   5 mg Oral Daily   busPIRone   10 mg Oral BID   fluticasone   2 spray Each Nare Daily   lamoTRIgine   25 mg Oral BID   sertraline   50 mg Oral Daily   sodium chloride  flush  3 mL Intravenous Q12H   Continuous Infusions:   Diet: Diet Order             Diet NPO time specified  Diet effective midnight           Diet Heart Room service appropriate? Yes with Assist; Fluid consistency: Thin  Diet effective now                    Data Reviewed: I have personally reviewed following labs and imaging studies ( see epic result tab) CBC: Recent Labs  Lab 02/17/24 1610 02/20/24 0343 02/21/24 0348  WBC 6.1 7.3 6.6  HGB  11.2* 11.5* 11.2*  HCT 35.6* 36.2 36.3  MCV 79.3* 78.0* 79.1*  PLT 235 240 261   CMP: Recent Labs  Lab 02/17/24 1610 02/19/24 0342 02/20/24 0343 02/21/24 0348  NA 141 138 139 139  K 4.5 4.4 4.0 4.4  CL 110 100 99 101  CO2 21* 26 27 27   GLUCOSE 100* 97 98 75  BUN 29* 30* 41* 36*  CREATININE 1.61* 1.83* 2.12* 1.92*  CALCIUM  9.1 8.6* 8.4* 8.9  MG  --   --  1.8 1.8   GFR: Estimated Creatinine Clearance: 42.3 mL/min (A) (by C-G formula based on SCr of 1.92 mg/dL (H)). Recent Labs  Lab 02/17/24 1610 02/20/24 0343 02/21/24 0348  AST 23 17 16   ALT 24 16 15  ALKPHOS 76 67 60  BILITOT 0.7 0.7 0.5  PROT 6.4* 6.0* 6.0*  ALBUMIN 3.3* 3.1* 3.0*   No results for input(s): LIPASE, AMYLASE in the last 168 hours. No results for input(s): AMMONIA in the last 168 hours. Coagulation Profile: No results for input(s): INR, PROTIME in the last 168 hours. Unresulted Labs (From admission, onward)     Start     Ordered   02/20/24 0500  CBC  Daily,   R     Question:  Specimen collection method  Answer:  Lab=Lab collect   02/19/24 1615   02/20/24 0500  Comprehensive metabolic panel  Daily,   R     Question:  Specimen collection method  Answer:  Lab=Lab collect   02/19/24 1624   02/18/24 1442  Creatinine clearance, urine, 24 hour  (Creatinine Clearance, urine, 24-hour panel)  Once,   R        02/18/24 1441   02/18/24 1442  Draw extra clot tube  (Creatinine Clearance, urine, 24-hour panel)  Once,   R        02/18/24 1441           Antimicrobials/Microbiology: Anti-infectives (From admission, onward)    None         Component Value Date/Time   SDES URINE, CLEAN CATCH 01/03/2024 0025   SPECREQUEST  01/03/2024 0025    NONE Performed at [facility], [address], [place], [place] [address]    CULT 70,000 COLONIES/mL ESCHERICHIA COLI (A) 01/03/2024 0025   REPTSTATUS 01/05/2024 FINAL 01/03/2024 0025    Procedures:    Mennie LAMY, MD [facility] 02/21/2024, 9:53 AM

## 2024-02-21 NOTE — Progress Notes (Signed)
 Progress Note  Patient Name: Abigail Sharp Date of Encounter: 02/21/2024  Primary Cardiologist: Madonna Large, DO  Subjective   AKI has improved.  Chest pain overnight improved with oxycodone .  Does not feel able to go home.  Inpatient Medications    Scheduled Meds:  apixaban   5 mg Oral BID   ARIPiprazole   5 mg Oral Daily   busPIRone   10 mg Oral BID   fluticasone   2 spray Each Nare Daily   lamoTRIgine   25 mg Oral BID   sertraline   50 mg Oral Daily   sodium chloride  flush  3 mL Intravenous Q12H   Continuous Infusions:   PRN Meds: acetaminophen  **OR** acetaminophen , alum & mag hydroxide-simeth, hydrALAZINE    Vital Signs    Vitals:   02/21/24 0012 02/21/24 0354 02/21/24 0530 02/21/24 0755  BP: (!) 108/59 129/87 127/80 122/87  Pulse: 76 71 72 72  Resp: 17 16 14 20   Temp: 98.3 F (36.8 C) 98.3 F (36.8 C)  98.8 F (37.1 C)  TempSrc: Oral Oral  Oral  SpO2: 99% 98% 100% 97%  Weight:  124.9 kg    Height:        Intake/Output Summary (Last 24 hours) at 02/21/2024 0957 Last data filed at 02/21/2024 0300 Gross per 24 hour  Intake 120 ml  Output 1500 ml  Net -1380 ml      02/21/2024    3:54 AM 02/20/2024    4:39 AM 02/19/2024    6:04 AM  Last 3 Weights  Weight (lbs) 275 lb 5.7 oz 275 lb 8 oz 284 lb 6.3 oz  Weight (kg) 124.9 kg 124.966 kg 129 kg     Telemetry    NSR rate 1st HB - Personally Reviewed  Physical Exam   GEN: No acute distress. Morbid obesity Cardiac: RRR Systolic murmurs no rubs, or gallops.  Respiratory: Clear to auscultation bilaterally.  GI: Soft, nontender, non-distended, BS +x 4. Extremities: No clubbing or cyanosis. No edema. Distal pedal pulses are 2+ and equal bilaterally. Neuro:  AAOx3. Follows commands.  Labs    High Sensitivity Troponin:   Recent Labs  Lab 02/17/24 1610 02/17/24 1755 02/18/24 1710 02/18/24 2007  TROPONINIHS 26* 29* 21* 19*       Chemistry Recent Labs  Lab 02/17/24 1610 02/19/24 0342 02/20/24 0343  02/21/24 0348  NA 141 138 139 139  K 4.5 4.4 4.0 4.4  CL 110 100 99 101  CO2 21* 26 27 27   GLUCOSE 100* 97 98 75  BUN 29* 30* 41* 36*  CREATININE 1.61* 1.83* 2.12* 1.92*  CALCIUM  9.1 8.6* 8.4* 8.9  PROT 6.4*  --  6.0* 6.0*  ALBUMIN 3.3*  --  3.1* 3.0*  AST 23  --  17 16  ALT 24  --  16 15  ALKPHOS 76  --  67 60  BILITOT 0.7  --  0.7 0.5  GFRNONAA 37* 32* 27* 30*  ANIONGAP 10 12 13 11      Hematology Recent Labs  Lab 02/17/24 1610 02/20/24 0343 02/21/24 0348  WBC 6.1 7.3 6.6  RBC 4.49 4.64 4.59  4.52  HGB 11.2* 11.5* 11.2*  HCT 35.6* 36.2 36.3  MCV 79.3* 78.0* 79.1*  MCH 24.9* 24.8* 24.4*  MCHC 31.5 31.8 30.9  RDW 18.5* 18.2* 18.4*  PLT 235 240 261    BNP Recent Labs  Lab 02/17/24 1610  BNP 856.6*      Radiology    US  RENAL Result Date: 02/20/2024 CLINICAL DATA:  204091 CKD (  chronic kidney disease) stage 4, GFR 15-29 ml/min (HCC) [id number] EXAM: RENAL / URINARY TRACT ULTRASOUND COMPLETE COMPARISON:  None Available. FINDINGS: Right Kidney: Renal measurements: 10.2 x 3.4 x 5.1 cm = volume: 92.3 mL.Normal echogenicity. No mass. No hydronephrosis or nephrolithiasis. Left Kidney: Renal measurements: 7.8 x 4.1 x 5.1 cm = volume: 85 mL. Normal echogenicity. No mass. No hydronephrosis or nephrolithiasis. Bladder: Appears normal for degree of bladder distention. Other: None. IMPRESSION: No hydronephrosis or nephrolithiasis. Electronically Signed   By: Rogelia Myers M.D.   On: 02/20/2024 15:59   ECHOCARDIOGRAM COMPLETE Result Date: 02/20/2024    ECHOCARDIOGRAM REPORT   Patient Name:   Abigail Sharp Date of Exam: 02/19/2024 Medical Rec #:  [id number]     Height:       64.0 in Accession #:    [id number]    Weight:       284.4 lb Date of Birth:  10-01-1965      BSA:          2.272 m Patient Age:    58 years      BP:           113/70 mmHg Patient Gender: F             HR:           75 bpm. Exam Location:  Inpatient Procedure: 2D Echo, Cardiac Doppler, Color Doppler and Intracardiac             Opacification Agent (Both Spectral and Color Flow Doppler were            utilized during procedure). Indications:    CHF I50.9  History:        Patient has prior history of Echocardiogram examinations, most                 recent 12/15/2020. Cardiomyopathy and CHF; Risk                 Factors:Hypertension.  Sonographer:    Thea Norlander RCS Referring Phys: [id number] ZANE ADAMS IMPRESSIONS  1. Left ventricular ejection fraction, by estimation, is 40 to 45%. The left ventricle has mildly decreased function. The left ventricle demonstrates global hypokinesis. There is mild left ventricular hypertrophy. Elevated left ventricular end-diastolic  pressure.  2. Right ventricular systolic function is normal. The right ventricular size is normal. There is mildly elevated pulmonary artery systolic pressure.  3. Left atrial size was severely dilated.  4. Right atrial size was moderately dilated.  5. Consider TEE to better evaluate mitral regurgitation. The mitral valve is normal in structure. Moderate to severe mitral valve regurgitation. No evidence of mitral stenosis.  6. The aortic valve is tricuspid. Aortic valve regurgitation is not visualized. No aortic stenosis is present.  7. The inferior vena cava is normal in size with greater than 50% respiratory variability, suggesting right atrial pressure of 3 mmHg. FINDINGS  Left Ventricle: Left ventricular ejection fraction, by estimation, is 40 to 45%. The left ventricle has mildly decreased function. The left ventricle demonstrates global hypokinesis. Definity  contrast agent was given IV to delineate the left ventricular  endocardial borders. The left ventricular internal cavity size was normal in size. There is mild left ventricular hypertrophy. Left ventricular diastolic function could not be evaluated due to mitral regurgitation (moderate or greater). Elevated left ventricular end-diastolic pressure. Right Ventricle: The right ventricular size is normal. No  increase in right ventricular wall thickness. Right ventricular systolic function is normal. There is mildly elevated pulmonary  artery systolic pressure. The tricuspid regurgitant velocity is 2.51  m/s, and with an assumed right atrial pressure of 15 mmHg, the estimated right ventricular systolic pressure is 40.2 mmHg. Left Atrium: Left atrial size was severely dilated. Right Atrium: Right atrial size was moderately dilated. Pericardium: There is no evidence of pericardial effusion. Mitral Valve: Consider TEE to better evaluate mitral regurgitation. The mitral valve is normal in structure. Moderate to severe mitral valve regurgitation. No evidence of mitral valve stenosis. Tricuspid Valve: The tricuspid valve is normal in structure. Tricuspid valve regurgitation is mild . No evidence of tricuspid stenosis. Aortic Valve: The aortic valve is tricuspid. Aortic valve regurgitation is not visualized. No aortic stenosis is present. Aortic valve peak gradient measures 10.0 mmHg. Pulmonic Valve: The pulmonic valve was normal in structure. Pulmonic valve regurgitation is mild. No evidence of pulmonic stenosis. Aorta: The aortic root is normal in size and structure. Venous: The inferior vena cava is normal in size with greater than 50% respiratory variability, suggesting right atrial pressure of 3 mmHg. IAS/Shunts: No atrial level shunt detected by color flow Doppler.  LEFT VENTRICLE PLAX 2D LVIDd:         5.90 cm   Diastology LVIDs:         4.40 cm   LV e' medial:    6.42 cm/s LV PW:         1.20 cm   LV E/e' medial:  17.4 LV IVS:        0.90 cm   LV e' lateral:   10.00 cm/s LVOT diam:     1.93 cm   LV E/e' lateral: 11.2 LV SV:         60 LV SV Index:   27 LVOT Area:     2.94 cm  RIGHT VENTRICLE            IVC RV S prime:     5.85 cm/s  IVC diam: 3.00 cm LEFT ATRIUM            Index        RIGHT ATRIUM           Index LA diam:      4.60 cm  2.02 cm/m   RA Area:     23.85 cm LA Vol (A2C): 84.2 ml  37.06 ml/m  RA Volume:    76.05 ml  33.47 ml/m LA Vol (A4C): 123.0 ml 54.13 ml/m  AORTIC VALVE AV Area (Vmax): 2.06 cm AV Vmax:        158.00 cm/s AV Peak Grad:   10.0 mmHg LVOT Vmax:      111.00 cm/s LVOT Vmean:     74.400 cm/s LVOT VTI:       0.206 m  AORTA Ao Root diam: 2.70 cm Ao Asc diam:  2.90 cm MITRAL VALVE                TRICUSPID VALVE MV Area (PHT): 4.60 cm     TR Peak grad:   25.2 mmHg MV Decel Time: 165 msec     TR Vmax:        251.00 cm/s MV E velocity: 112.00 cm/s MV A velocity: 38.80 cm/s   SHUNTS MV E/A ratio:  2.89         Systemic VTI:  0.21 m                             Systemic Diam: 1.93  cm Annabella Scarce MD Electronically signed by Annabella Scarce MD Signature Date/Time: 02/20/2024/7:10:27 AM    Final     Cardiac Studies   2d echo 02/19/24    1. Left ventricular ejection fraction, by estimation, is 40 to 45%. The  left ventricle has mildly decreased function. The left ventricle  demonstrates global hypokinesis. There is mild left ventricular  hypertrophy. Elevated left ventricular end-diastolic   pressure.   2. Right ventricular systolic function is normal. The right ventricular  size is normal. There is mildly elevated pulmonary artery systolic  pressure.   3. Left atrial size was severely dilated.   4. Right atrial size was moderately dilated.   5. Consider TEE to better evaluate mitral regurgitation. The mitral valve  is normal in structure. Moderate to severe mitral valve regurgitation. No  evidence of mitral stenosis.   6. The aortic valve is tricuspid. Aortic valve regurgitation is not  visualized. No aortic stenosis is present.   7. The inferior vena cava is normal in size with greater than 50%  respiratory variability, suggesting right atrial pressure of 3 mmHg.   Patient Profile     58 y.o. female with  anxiety, agoraphobia, hypertension, nonischemic cardiomyopathy, chronic HFimpEF, morbid obesity, CKD ?stage IV. Dx with NICM EF 25% in 2016 with normal coronaries. EF in 2022  was 60-65%. Admitted with worsening CP, SOB, abdominal pain/swelling, nausea, vomiting, diaphoresis, orthopnea. Admitted with a/c CHF and also noted to have atrial flutter. Recent Cr 01/2024 was 2.1 (last 1.21 in 2021), admitted here at 1.61, also mildly anemic with neg FOBT.  Assessment & Plan    Multi-factorial SOB - HFrEF- euvoelmic with -6 L out, diuretics held and planned, at discharge for coreg  3.125 mg PO BID and spironolactone  12.5 mg PO daily (see 02/20/24 note) - with super morbid obesity- as per primary, GLP-1 theray as outpatient may be appropriate - has mod-severe TR.  Deferred RHC and TEE on Thursday and Friday.  Now would like to get TEE.  Planned for Monday  Atypical CP - low/flat troponin not felt to reflect ACS - prior cath 2016 with normal coronaries   Recent progressive renal dysfunction, unclear if this is AKI on CKD stage IIIa versus progression to CKD stage IV - distend bladder with normal RUS, may be her new baseline - will need home maintenance diuretic   Paroxysmal atrial flutter - 1900 to 2300 day of consultation, telemetry tracings also with possible Afib as well - transitioned to DOAC Mild microcytic anemia - FOBT negative, no bleeding reported - low normal Iron, will defer supplementation question to prior  Morbid obesity - consider sleep study as OP i   QT prolongation - 503 ms; psych meds as per psych team   For questions or updates, please contact [facility] Please consult [email or web address] for contact info under Cardiology/STEMI.  Stanly Leavens, MD FASE [facility] Cardiologist [facility]  [address] [place], [place] [address] [phone]  9:59 AM

## 2024-02-21 NOTE — Progress Notes (Signed)
 Pt complaining of 10/10 chest pain, while also asking for Ice cream. VSS, previously ordered EKG performed. Cardiology and TRH called 5 mg oxycodone  ordered. Pt eating ice cream now and says it is somewhat better

## 2024-02-21 NOTE — TOC Progression Note (Signed)
 Transition of Care [facility]) - Progression Note    Patient Details  Name: Abigail Sharp MRN: [id number] Date of Birth: 1965-09-10  Transition of [facility]) CM/SW Contact  Montie LOISE Louder, [place] Phone Number: 02/21/2024, 9:36 AM  Clinical Narrative:     CSW added psychiatry resources to AVS.   Expected Discharge Plan: Home/Self Care Barriers to Discharge: Continued Medical Work up               Expected Discharge Plan and Services   Discharge Planning Services: CM Consult, Medication Assistance   Living arrangements for the past 2 months: Apartment                                       Social Drivers of Health (SDOH) Interventions SDOH Screenings   Food Insecurity: No Food Insecurity (02/18/2024)  Housing: Low Risk  (02/18/2024)  Transportation Needs: No Transportation Needs (02/18/2024)  Utilities: Not At Risk (02/18/2024)  Depression (PHQ2-9): High Risk (06/19/2023)  Tobacco Use: Low Risk  (02/17/2024)    Readmission Risk Interventions     No data to display

## 2024-02-22 DIAGNOSIS — I5031 Acute diastolic (congestive) heart failure: Secondary | ICD-10-CM | POA: Diagnosis not present

## 2024-02-22 DIAGNOSIS — I509 Heart failure, unspecified: Secondary | ICD-10-CM | POA: Diagnosis not present

## 2024-02-22 LAB — BASIC METABOLIC PANEL WITH GFR
Anion gap: 9 (ref 5–15)
BUN: 37 mg/dL — ABNORMAL HIGH (ref 6–20)
CO2: 26 mmol/L (ref 22–32)
Calcium: 8.9 mg/dL (ref 8.9–10.3)
Chloride: 103 mmol/L (ref 98–111)
Creatinine, Ser: 1.65 mg/dL — ABNORMAL HIGH (ref 0.44–1.00)
GFR, Estimated: 36 mL/min — ABNORMAL LOW (ref >60)
Glucose, Bld: 87 mg/dL (ref 70–99)
Potassium: 4.7 mmol/L (ref 3.5–5.1)
Sodium: 138 mmol/L (ref 135–145)

## 2024-02-22 MED ORDER — OXYCODONE HCL 5 MG PO TABS
5.0000 mg | ORAL_TABLET | Freq: Once | ORAL | Status: AC | PRN
  Administered 2024-02-22: 5 mg via ORAL
  Filled 2024-02-22: qty 1

## 2024-02-22 MED ORDER — OXYCODONE HCL 5 MG PO TABS
5.0000 mg | ORAL_TABLET | Freq: Three times a day (TID) | ORAL | Status: AC | PRN
  Administered 2024-02-23: 5 mg via ORAL
  Filled 2024-02-22: qty 1

## 2024-02-22 MED ORDER — SALINE SPRAY 0.65 % NA SOLN
1.0000 | NASAL | Status: DC | PRN
  Filled 2024-02-22: qty 44

## 2024-02-22 NOTE — H&P (View-Only) (Signed)
 Progress Note  Patient Name: Abigail Sharp Date of Encounter: 02/22/2024  Primary Cardiologist: Madonna Large, DO  Subjective   Creatinine has improved.  CP over night.  Had deferred cath (see prior notes) but is amenable to TEE.  She notes the pain is worse when she presses in between her rib cage.  Inpatient Medications    Scheduled Meds:  apixaban   5 mg Oral BID   ARIPiprazole   5 mg Oral Daily   busPIRone   10 mg Oral BID   fluticasone   2 spray Each Nare Daily   lamoTRIgine   25 mg Oral BID   sertraline   50 mg Oral Daily   sodium chloride  flush  3 mL Intravenous Q12H   Continuous Infusions:   PRN Meds: acetaminophen  **OR** acetaminophen , alum & mag hydroxide-simeth, hydrALAZINE , oxyCODONE    Vital Signs    Vitals:   02/21/24 2329 02/22/24 0354 02/22/24 0556 02/22/24 0811  BP: 125/64 130/72  (!) 142/85  Pulse: 81 79 73 77  Resp: 20 20 19 20   Temp: 98.3 F (36.8 C) 98.2 F (36.8 C)  97.8 F (36.6 C)  TempSrc: Oral Oral  Oral  SpO2: 99% 94% (!) 89% 94%  Weight:   126.4 kg   Height:        Intake/Output Summary (Last 24 hours) at 02/22/2024 0856 Last data filed at 02/22/2024 0354 Gross per 24 hour  Intake 240 ml  Output 1600 ml  Net -1360 ml      02/22/2024    5:56 AM 02/21/2024    3:54 AM 02/20/2024    4:39 AM  Last 3 Weights  Weight (lbs) 278 lb 10.6 oz 275 lb 5.7 oz 275 lb 8 oz  Weight (kg) 126.4 kg 124.9 kg 124.966 kg     Telemetry    NSR rate with bi-atrial dilation - Personally Reviewed  Physical Exam   GEN: No acute distress. Morbid obesity Cardiac: RRR Systolic murmurs no rubs, or gallops.  Respiratory: Clear to auscultation bilaterally.  GI: Soft, nontender, non-distended, BS +x 4. Extremities: No clubbing or cyanosis. No edema. Distal pedal pulses are 2+ and equal bilaterally. Neuro:  AAOx3. Follows commands.  Labs    High Sensitivity Troponin:   Recent Labs  Lab 02/17/24 1610 02/17/24 1755 02/18/24 1710 02/18/24 2007  TROPONINIHS  26* 29* 21* 19*       Chemistry Recent Labs  Lab 02/17/24 1610 02/19/24 0342 02/20/24 0343 02/21/24 0348 02/22/24 0244  NA 141   < > 139 139 138  K 4.5   < > 4.0 4.4 4.7  CL 110   < > 99 101 103  CO2 21*   < > 27 27 26   GLUCOSE 100*   < > 98 75 87  BUN 29*   < > 41* 36* 37*  CREATININE 1.61*   < > 2.12* 1.92* 1.65*  CALCIUM  9.1   < > 8.4* 8.9 8.9  PROT 6.4*  --  6.0* 6.0*  --   ALBUMIN 3.3*  --  3.1* 3.0*  --   AST 23  --  17 16  --   ALT 24  --  16 15  --   ALKPHOS 76  --  67 60  --   BILITOT 0.7  --  0.7 0.5  --   GFRNONAA 37*   < > 27* 30* 36*  ANIONGAP 10   < > 13 11 9    < > = values in this interval not displayed.  Hematology Recent Labs  Lab 02/17/24 1610 02/20/24 0343 02/21/24 0348  WBC 6.1 7.3 6.6  RBC 4.49 4.64 4.59  4.52  HGB 11.2* 11.5* 11.2*  HCT 35.6* 36.2 36.3  MCV 79.3* 78.0* 79.1*  MCH 24.9* 24.8* 24.4*  MCHC 31.5 31.8 30.9  RDW 18.5* 18.2* 18.4*  PLT 235 240 261    BNP Recent Labs  Lab 02/17/24 1610  BNP 856.6*      Radiology    US  RENAL Result Date: 02/20/2024 CLINICAL DATA:  [id number] CKD (chronic kidney disease) stage 4, GFR 15-29 ml/min (HCC) [id number] EXAM: RENAL / URINARY TRACT ULTRASOUND COMPLETE COMPARISON:  None Available. FINDINGS: Right Kidney: Renal measurements: 10.2 x 3.4 x 5.1 cm = volume: 92.3 mL.Normal echogenicity. No mass. No hydronephrosis or nephrolithiasis. Left Kidney: Renal measurements: 7.8 x 4.1 x 5.1 cm = volume: 85 mL. Normal echogenicity. No mass. No hydronephrosis or nephrolithiasis. Bladder: Appears normal for degree of bladder distention. Other: None. IMPRESSION: No hydronephrosis or nephrolithiasis. Electronically Signed   By: Rogelia Myers M.D.   On: 02/20/2024 15:59    Cardiac Studies   2d echo 02/19/24    1. Left ventricular ejection fraction, by estimation, is 40 to 45%. The  left ventricle has mildly decreased function. The left ventricle  demonstrates global hypokinesis. There is mild left  ventricular  hypertrophy. Elevated left ventricular end-diastolic   pressure.   2. Right ventricular systolic function is normal. The right ventricular  size is normal. There is mildly elevated pulmonary artery systolic  pressure.   3. Left atrial size was severely dilated.   4. Right atrial size was moderately dilated.   5. Consider TEE to better evaluate mitral regurgitation. The mitral valve  is normal in structure. Moderate to severe mitral valve regurgitation. No  evidence of mitral stenosis.   6. The aortic valve is tricuspid. Aortic valve regurgitation is not  visualized. No aortic stenosis is present.   7. The inferior vena cava is normal in size with greater than 50%  respiratory variability, suggesting right atrial pressure of 3 mmHg.   Patient Profile     58 y.o. female with  anxiety, agoraphobia, hypertension, nonischemic cardiomyopathy, chronic HFimpEF, morbid obesity, CKD ?stage IV. Dx with NICM EF 25% in 2016 with normal coronaries. EF in 2022 was 60-65%. Admitted with worsening CP, SOB, abdominal pain/swelling, nausea, vomiting, diaphoresis, orthopnea. Admitted with a/c CHF and also noted to have atrial flutter. Recent Cr 01/2024 was 2.1 (last 1.21 in 2021), admitted here at 1.61, also mildly anemic with neg FOBT.  Assessment & Plan    Acute on Chronic HF - 6 L with return to RA; euvolemic on exam - AKI with holding meds and improving Cr; DC plan for 12.5 MRA nad coreg  3.125 mg PO BID - current she is on room air - she has declined LHC and RHC - she has new mod-severe MR; consented for TEE  on 02/21/24), NPO at midnight placed  Atypical CP (worse with palpitation) Allodynia - low/flat troponin not felt to reflect ACS - prior cath 2016 with normal coronaries - deferred cath  Recent progressive renal dysfunction, unclear if this is AKI on CKD stage IIIa versus progression to CKD stage IV - will need home maintenance diuretic at DC, AKI is improving currently    Paroxysmal atrial flutter - 1900 to 2300 day of consultation, telemetry tracings also with possible Afib as well - transitioned to DOAC  Mild microcytic anemia - FOBT negative, no bleeding reported  Super morbid Morbid obesity - consider sleep study as OP   QT prolongation - 503 ms; psych meds as per psych team  High risk of re-admission  For questions or updates, please contact [facility] Please consult [email or web address] for contact info under Cardiology/STEMI.  Stanly Leavens, MD FASE [facility] Cardiologist [facility]  [address] [place], [place] [address] [phone]  8:56 AM

## 2024-02-22 NOTE — Progress Notes (Signed)
 PROGRESS NOTE Abigail Sharp  FMW:[id number] DOB: [birth year] DOA: 02/17/2024 PCP: Pcp, No  Brief Narrative/Hospital Course: Abigail Sharp is a 58 y.o. female with PMH of hypertension, HFpEF, anxiety presenting with chest pain and shortness of breath.symoptoms going on for months aand worse with activity, some orthopnea. She gets pain all over on walking. Never had sleep apnea. She also c/o chest pain when she wakes up as she cannont breather and feels lie  heart coming out of chest  reports having cath in 2016 and no stent needed. She also c/o nasal stuffiness, cough for sometime. She has lots of complaints feeling hot. [name]  has been having nausea. Patient otherwise denies any nausea, vomiting,fever, chills, headache, focal weakness, numbness tingling, speech difficulties.  She endorses weight gain was 277 lb on 8/1 and repeat pending. In the ED:BP poorly controlled 160/110, saturating 90 to 97% on room air, heart rate 102 afebrile somewhat tachypneic in 20s EKG without STEMI.  Hemoglobin 11.2, FOBT negative, creatinine 1.6 (stable), troponin 26 > 29, BNP 856.  UA STABLE Chest x-ray showing vascular congestion and possible tiny pleural effusions.  Patient was given aspirin , IV Lasix  40 mg, and morphine  and admission was requested for CHF exacerbation Cardiology was consulted placed on heparin  for anticoagulation for A-fib, echo showed reduced EF 40-45% with moderate to severe MR. Patient was diuresed, also needing IV fluid support   Subjective: Seen and examined Overnight afebrile BP stable She feels fine no nausea vomiting chest pain fever chills Requesting nasal spray Getting nightly Oxy for chest pain Labs this morning shows creatinine further improved 2.1>1.9> 1.6  Assessment and plan:  Acute on chronic CHF with reduced ejection fraction and moderate to severe functional MR HX of NICM Chest pain Orthostatic hypotension Elevated troponin- likely due to demand ischemia-EKG  nonischemic: Patient is noncompliant,recently seen by Dr. Michele on 8/1 weight when in August was 277 pounds on admission to 284 lb Seen by cardiology, underwent IV diuretics 80 bid 9/18  but with BP orthostatic got 500 cc bolus 9/19.Lasix  on hold TTE :EF 40-45%, GHNK LV ,RV SF, moderate to severe MR, also having intermittent chest pain although EKG nonischemic troponin flat> per cardiology planning for TEE Monday. She will need home diuretics on discharge Cont to monitor daily I/O,weight, electrolytes  w/ salt/fluid restricted diet and monitor in tele  Net IO Since Admission: -8,574.36 mL [02/22/24 1113]  Filed Weights   02/20/24 0439 02/21/24 0354 02/22/24 0556  Weight: 125 kg 124.9 kg 126.4 kg    Recent Labs  Lab 02/17/24 1610 02/19/24 0342 02/20/24 0343 02/21/24 0348 02/22/24 0244  BNP 856.6*  --   --   --   --   BUN 29* 30* 41* 36* 37*  CREATININE 1.61* 1.83* 2.12* 1.92* 1.65*  K 4.5 4.4 4.0 4.4 4.7  MG  --   --  1.8 1.8  --    AKI on CKD 3B vs CKD 4: Last creatinine in August was 2.1 although previously up to 1.2.  Overall holding stable with some improvement as below.  Monitor Recent Labs    01/02/24 1504 01/02/24 2353 02/17/24 1610 02/19/24 0342 02/20/24 0343 02/21/24 0348 02/22/24 0244  BUN 39*  --  29* 30* 41* 36* 37*  CREATININE 2.18*  --  1.61* 1.83* 2.12* 1.92* 1.65*  CO2 21*  --  21* 26 27 27 26   K 4.8   < > 4.5 4.4 4.0 4.4 4.7   < > = values in this interval not displayed.  New onset Atrial flutter: Continue Eliquis , off Coreg  due to hypotension.TSH normal  Prolonged Qtc: QTc 9/20 477 previously 503 on 9/19.  Celexa  dose has been decreased.  Monitor  Microcytic anemia Hemoglobin stable   Anxiety/depression/OCD: Mood stable.non compliant with psych meds at home seen by psychiatry> reintroduced solids, continue Abilify , BuSpar  Lamictal  and Zoloft  slightly lower dose due to prolonged QTc  Morbid Obesity w/ Body mass index is 47.83 kg/m.: Will  benefit with PCP follow-up, weight loss,healthy lifestyle and outpatient sleep eval if not done.  Mobility: PT Orders:  PT Follow up Rec: Home Health Pt9/18/2025 1545   DVT prophylaxis:  Code Status:   Code Status: Full Code Family Communication: plan of care discussed with patient at bedside. Patient status is: Remains hospitalized because of severity of illness Level of care: Telemetry Cardiac   Dispo: The patient is from: home            Anticipated disposition: Pending cardiology clearance  Objective: Vitals last 24 hrs: Vitals:   02/22/24 0354 02/22/24 0556 02/22/24 0811 02/22/24 1107  BP: 130/72  (!) 142/85 123/61  Pulse: 79 73 77 68  Resp: 20 19 20  (!) 22  Temp: 98.2 F (36.8 C)  97.8 F (36.6 C) 98.2 F (36.8 C)  TempSrc: Oral  Oral Oral  SpO2: 94% (!) 89% 94% 95%  Weight:  126.4 kg    Height:        Physical Examination: General exam: AAOX3 HEENT:Oral mucosa moist, Ear/Nose WNL grossly Respiratory system: Bilaterally clear BS,no use of accessory muscle Cardiovascular system: S1 & S2 +, No JVD. Gastrointestinal system: Abdomen soft,NT,ND, BS+ Nervous System: Alert, awake, moving all extremities,and following commands. Extremities: extremities warm, leg edema + Skin: No rashes,no icterus. MSK: Normal muscle bulk,tone, power   Medications reviewed:  Scheduled Meds:  apixaban   5 mg Oral BID   ARIPiprazole   5 mg Oral Daily   busPIRone   10 mg Oral BID   fluticasone   2 spray Each Nare Daily   lamoTRIgine   25 mg Oral BID   sertraline   50 mg Oral Daily   sodium chloride  flush  3 mL Intravenous Q12H   Continuous Infusions:   Diet: Diet Order             Diet NPO time specified  Diet effective midnight           Diet Heart Room service appropriate? Yes with Assist; Fluid consistency: Thin  Diet effective now                    Data Reviewed: I have personally reviewed following labs and imaging studies ( see epic result tab) CBC: Recent Labs  Lab  02/17/24 1610 02/20/24 0343 02/21/24 0348  WBC 6.1 7.3 6.6  HGB 11.2* 11.5* 11.2*  HCT 35.6* 36.2 36.3  MCV 79.3* 78.0* 79.1*  PLT 235 240 261   CMP: Recent Labs  Lab 02/17/24 1610 02/19/24 0342 02/20/24 0343 02/21/24 0348 02/22/24 0244  NA 141 138 139 139 138  K 4.5 4.4 4.0 4.4 4.7  CL 110 100 99 101 103  CO2 21* 26 27 27 26   GLUCOSE 100* 97 98 75 87  BUN 29* 30* 41* 36* 37*  CREATININE 1.61* 1.83* 2.12* 1.92* 1.65*  CALCIUM  9.1 8.6* 8.4* 8.9 8.9  MG  --   --  1.8 1.8  --    GFR: Estimated Creatinine Clearance: 49.5 mL/min (A) (by C-G formula based on SCr of 1.65 mg/dL (H)). Recent  Labs  Lab 02/17/24 1610 02/20/24 0343 02/21/24 0348  AST 23 17 16   ALT 24 16 15   ALKPHOS 76 67 60  BILITOT 0.7 0.7 0.5  PROT 6.4* 6.0* 6.0*  ALBUMIN 3.3* 3.1* 3.0*   No results for input(s): LIPASE, AMYLASE in the last 168 hours. No results for input(s): AMMONIA in the last 168 hours. Coagulation Profile: No results for input(s): INR, PROTIME in the last 168 hours. Unresulted Labs (From admission, onward)     Start     Ordered   02/23/24 0500  CBC  Once,   R       Question:  Specimen collection method  Answer:  Lab=Lab collect   02/21/24 0954   02/22/24 0500  Basic metabolic panel with GFR  Daily,   R     Question:  Specimen collection method  Answer:  Lab=Lab collect   02/21/24 0954   02/18/24 1442  Creatinine clearance, urine, 24 hour  (Creatinine Clearance, urine, 24-hour panel)  Once,   R        02/18/24 1441   02/18/24 1442  Draw extra clot tube  (Creatinine Clearance, urine, 24-hour panel)  Once,   R        02/18/24 1441           Antimicrobials/Microbiology: Anti-infectives (From admission, onward)    None         Component Value Date/Time   SDES URINE, CLEAN CATCH 01/03/2024 0025   SPECREQUEST  01/03/2024 0025    NONE Performed at [facility], [address], [place], [place] [address]    CULT 70,000 COLONIES/mL ESCHERICHIA COLI (A)  01/03/2024 0025   REPTSTATUS 01/05/2024 FINAL 01/03/2024 0025   Procedures:  Mennie LAMY, MD [facility] 02/22/2024, 11:13 AM

## 2024-02-22 NOTE — Anesthesia Preprocedure Evaluation (Signed)
 Anesthesia Evaluation  Patient identified by MRN, date of birth, ID band Patient awake    Reviewed: Allergy & Precautions, NPO status , Patient's Chart, lab work & pertinent test results, reviewed documented beta blocker date and time   Airway Mallampati: II  TM Distance: >3 FB Neck ROM: Full    Dental no notable dental hx. (+) Missing, Chipped, Loose, Dental Advisory Given,    Pulmonary neg pulmonary ROS   Pulmonary exam normal breath sounds clear to auscultation       Cardiovascular hypertension, Pt. on home beta blockers and Pt. on medications +CHF  Normal cardiovascular exam+ Valvular Problems/Murmurs (mod/severe MR) MR  Rhythm:Regular Rate:Normal  TTE 2025 1. Left ventricular ejection fraction, by estimation, is 40 to 45%. The  left ventricle has mildly decreased function. The left ventricle  demonstrates global hypokinesis. There is mild left ventricular  hypertrophy. Elevated left ventricular end-diastolic   pressure.   2. Right ventricular systolic function is normal. The right ventricular  size is normal. There is mildly elevated pulmonary artery systolic  pressure.   3. Left atrial size was severely dilated.   4. Right atrial size was moderately dilated.   5. Consider TEE to better evaluate mitral regurgitation. The mitral valve  is normal in structure. Moderate to severe mitral valve regurgitation. No  evidence of mitral stenosis.   6. The aortic valve is tricuspid. Aortic valve regurgitation is not  visualized. No aortic stenosis is present.   7. The inferior vena cava is normal in size with greater than 50%  respiratory variability, suggesting right atrial pressure of 3 mmHg.     Neuro/Psych  PSYCHIATRIC DISORDERS Anxiety Depression    negative neurological ROS     GI/Hepatic negative GI ROS, Neg liver ROS,,,  Endo/Other    Class 3 obesity (BMI 48)  Renal/GU Renal InsufficiencyRenal disease  negative  genitourinary   Musculoskeletal negative musculoskeletal ROS (+)    Abdominal   Peds  Hematology negative hematology ROS (+)   Anesthesia Other Findings 58 y.o. female with  anxiety, agoraphobia, hypertension, nonischemic cardiomyopathy, chronic HFimpEF, morbid obesity, CKD stage IV. Dx with NICM EF 25% in 2016 with normal coronaries. EF in 2022 was 60-65%. Admitted with worsening CP, SOB, abdominal pain/swelling, nausea, vomiting, diaphoresis, orthopnea. Admitted with a/c CHF and also noted to have atrial flutter. Recent Cr 01/2024 was 2.1 (last 1.21 in 2021), admitted here at 1.61, also mildly anemic with neg FOBT.  Reproductive/Obstetrics                              Anesthesia Physical Anesthesia Plan  ASA: 3  Anesthesia Plan: MAC   Post-op Pain Management:    Induction: Intravenous  PONV Risk Score and Plan: Propofol  infusion and Treatment may vary due to age or medical condition  Airway Management Planned: Natural Airway  Additional Equipment:   Intra-op Plan:   Post-operative Plan:   Informed Consent: I have reviewed the patients History and Physical, chart, labs and discussed the procedure including the risks, benefits and alternatives for the proposed anesthesia with the patient or authorized representative who has indicated his/her understanding and acceptance.     Dental advisory given  Plan Discussed with: CRNA  Anesthesia Plan Comments:          Anesthesia Quick Evaluation

## 2024-02-22 NOTE — Progress Notes (Signed)
 Progress Note  Patient Name: Abigail Sharp Date of Encounter: 02/22/2024  Primary Cardiologist: Madonna Large, DO  Subjective   Creatinine has improved.  CP over night.  Had deferred cath (see prior notes) but is amenable to TEE.  She notes the pain is worse when she presses in between her rib cage.  Inpatient Medications    Scheduled Meds:  apixaban   5 mg Oral BID   ARIPiprazole   5 mg Oral Daily   busPIRone   10 mg Oral BID   fluticasone   2 spray Each Nare Daily   lamoTRIgine   25 mg Oral BID   sertraline   50 mg Oral Daily   sodium chloride  flush  3 mL Intravenous Q12H   Continuous Infusions:   PRN Meds: acetaminophen  **OR** acetaminophen , alum & mag hydroxide-simeth, hydrALAZINE , oxyCODONE    Vital Signs    Vitals:   02/21/24 2329 02/22/24 0354 02/22/24 0556 02/22/24 0811  BP: 125/64 130/72  (!) 142/85  Pulse: 81 79 73 77  Resp: 20 20 19 20   Temp: 98.3 F (36.8 C) 98.2 F (36.8 C)  97.8 F (36.6 C)  TempSrc: Oral Oral  Oral  SpO2: 99% 94% (!) 89% 94%  Weight:   126.4 kg   Height:        Intake/Output Summary (Last 24 hours) at 02/22/2024 0856 Last data filed at 02/22/2024 0354 Gross per 24 hour  Intake 240 ml  Output 1600 ml  Net -1360 ml      02/22/2024    5:56 AM 02/21/2024    3:54 AM 02/20/2024    4:39 AM  Last 3 Weights  Weight (lbs) 278 lb 10.6 oz 275 lb 5.7 oz 275 lb 8 oz  Weight (kg) 126.4 kg 124.9 kg 124.966 kg     Telemetry    NSR rate with bi-atrial dilation - Personally Reviewed  Physical Exam   GEN: No acute distress. Morbid obesity Cardiac: RRR Systolic murmurs no rubs, or gallops.  Respiratory: Clear to auscultation bilaterally.  GI: Soft, nontender, non-distended, BS +x 4. Extremities: No clubbing or cyanosis. No edema. Distal pedal pulses are 2+ and equal bilaterally. Neuro:  AAOx3. Follows commands.  Labs    High Sensitivity Troponin:   Recent Labs  Lab 02/17/24 1610 02/17/24 1755 02/18/24 1710 02/18/24 2007  TROPONINIHS  26* 29* 21* 19*       Chemistry Recent Labs  Lab 02/17/24 1610 02/19/24 0342 02/20/24 0343 02/21/24 0348 02/22/24 0244  NA 141   < > 139 139 138  K 4.5   < > 4.0 4.4 4.7  CL 110   < > 99 101 103  CO2 21*   < > 27 27 26   GLUCOSE 100*   < > 98 75 87  BUN 29*   < > 41* 36* 37*  CREATININE 1.61*   < > 2.12* 1.92* 1.65*  CALCIUM  9.1   < > 8.4* 8.9 8.9  PROT 6.4*  --  6.0* 6.0*  --   ALBUMIN 3.3*  --  3.1* 3.0*  --   AST 23  --  17 16  --   ALT 24  --  16 15  --   ALKPHOS 76  --  67 60  --   BILITOT 0.7  --  0.7 0.5  --   GFRNONAA 37*   < > 27* 30* 36*  ANIONGAP 10   < > 13 11 9    < > = values in this interval not displayed.  Hematology Recent Labs  Lab 02/17/24 1610 02/20/24 0343 02/21/24 0348  WBC 6.1 7.3 6.6  RBC 4.49 4.64 4.59  4.52  HGB 11.2* 11.5* 11.2*  HCT 35.6* 36.2 36.3  MCV 79.3* 78.0* 79.1*  MCH 24.9* 24.8* 24.4*  MCHC 31.5 31.8 30.9  RDW 18.5* 18.2* 18.4*  PLT 235 240 261    BNP Recent Labs  Lab 02/17/24 1610  BNP 856.6*      Radiology    US  RENAL Result Date: 02/20/2024 CLINICAL DATA:  [id number] CKD (chronic kidney disease) stage 4, GFR 15-29 ml/min (HCC) [id number] EXAM: RENAL / URINARY TRACT ULTRASOUND COMPLETE COMPARISON:  None Available. FINDINGS: Right Kidney: Renal measurements: 10.2 x 3.4 x 5.1 cm = volume: 92.3 mL.Normal echogenicity. No mass. No hydronephrosis or nephrolithiasis. Left Kidney: Renal measurements: 7.8 x 4.1 x 5.1 cm = volume: 85 mL. Normal echogenicity. No mass. No hydronephrosis or nephrolithiasis. Bladder: Appears normal for degree of bladder distention. Other: None. IMPRESSION: No hydronephrosis or nephrolithiasis. Electronically Signed   By: Rogelia Myers M.D.   On: 02/20/2024 15:59    Cardiac Studies   2d echo 02/19/24    1. Left ventricular ejection fraction, by estimation, is 40 to 45%. The  left ventricle has mildly decreased function. The left ventricle  demonstrates global hypokinesis. There is mild left  ventricular  hypertrophy. Elevated left ventricular end-diastolic   pressure.   2. Right ventricular systolic function is normal. The right ventricular  size is normal. There is mildly elevated pulmonary artery systolic  pressure.   3. Left atrial size was severely dilated.   4. Right atrial size was moderately dilated.   5. Consider TEE to better evaluate mitral regurgitation. The mitral valve  is normal in structure. Moderate to severe mitral valve regurgitation. No  evidence of mitral stenosis.   6. The aortic valve is tricuspid. Aortic valve regurgitation is not  visualized. No aortic stenosis is present.   7. The inferior vena cava is normal in size with greater than 50%  respiratory variability, suggesting right atrial pressure of 3 mmHg.   Patient Profile     58 y.o. female with  anxiety, agoraphobia, hypertension, nonischemic cardiomyopathy, chronic HFimpEF, morbid obesity, CKD ?stage IV. Dx with NICM EF 25% in 2016 with normal coronaries. EF in 2022 was 60-65%. Admitted with worsening CP, SOB, abdominal pain/swelling, nausea, vomiting, diaphoresis, orthopnea. Admitted with a/c CHF and also noted to have atrial flutter. Recent Cr 01/2024 was 2.1 (last 1.21 in 2021), admitted here at 1.61, also mildly anemic with neg FOBT.  Assessment & Plan    Acute on Chronic HF - 6 L with return to RA; euvolemic on exam - AKI with holding meds and improving Cr; DC plan for 12.5 MRA nad coreg  3.125 mg PO BID - current she is on room air - she has declined LHC and RHC - she has new mod-severe MR; consented for TEE  on 02/21/24), NPO at midnight placed  Atypical CP (worse with palpitation) Allodynia - low/flat troponin not felt to reflect ACS - prior cath 2016 with normal coronaries - deferred cath  Recent progressive renal dysfunction, unclear if this is AKI on CKD stage IIIa versus progression to CKD stage IV - will need home maintenance diuretic at DC, AKI is improving currently    Paroxysmal atrial flutter - 1900 to 2300 day of consultation, telemetry tracings also with possible Afib as well - transitioned to DOAC  Mild microcytic anemia - FOBT negative, no bleeding reported  Super morbid Morbid obesity - consider sleep study as OP   QT prolongation - 503 ms; psych meds as per psych team  High risk of re-admission  For questions or updates, please contact [facility] Please consult [email or web address] for contact info under Cardiology/STEMI.  Stanly Leavens, MD FASE [facility] Cardiologist [facility]  [address] [place], [place] [address] [phone]  8:56 AM

## 2024-02-22 NOTE — Progress Notes (Signed)
 Pt is again complaining of  chest pain intermittent, tylenol  was effective earlier, how ever it is not time. MD paged new orders received

## 2024-02-23 ENCOUNTER — Encounter (HOSPITAL_COMMUNITY)
Admission: EM | Disposition: A | Discharge status: Home / Self Care | Payer: Self-pay | Source: Home / Self Care | Attending: Internal Medicine

## 2024-02-23 ENCOUNTER — Inpatient Hospital Stay (HOSPITAL_COMMUNITY): Payer: MEDICAID | Admitting: Anesthesiology

## 2024-02-23 ENCOUNTER — Inpatient Hospital Stay (HOSPITAL_COMMUNITY): Payer: MEDICAID

## 2024-02-23 DIAGNOSIS — I340 Nonrheumatic mitral (valve) insufficiency: Secondary | ICD-10-CM | POA: Insufficient documentation

## 2024-02-23 DIAGNOSIS — I110 Hypertensive heart disease with heart failure: Secondary | ICD-10-CM | POA: Diagnosis not present

## 2024-02-23 DIAGNOSIS — R7989 Other specified abnormal findings of blood chemistry: Secondary | ICD-10-CM | POA: Diagnosis not present

## 2024-02-23 DIAGNOSIS — Q2112 Patent foramen ovale: Secondary | ICD-10-CM

## 2024-02-23 DIAGNOSIS — R0789 Other chest pain: Secondary | ICD-10-CM

## 2024-02-23 DIAGNOSIS — I5023 Acute on chronic systolic (congestive) heart failure: Secondary | ICD-10-CM | POA: Diagnosis not present

## 2024-02-23 DIAGNOSIS — I5031 Acute diastolic (congestive) heart failure: Secondary | ICD-10-CM | POA: Diagnosis not present

## 2024-02-23 DIAGNOSIS — N179 Acute kidney failure, unspecified: Secondary | ICD-10-CM | POA: Diagnosis not present

## 2024-02-23 DIAGNOSIS — F418 Other specified anxiety disorders: Secondary | ICD-10-CM | POA: Diagnosis not present

## 2024-02-23 HISTORY — PX: TRANSESOPHAGEAL ECHOCARDIOGRAM (CATH LAB): EP1270

## 2024-02-23 LAB — CBC
HCT: 38.1 % (ref 36.0–46.0)
Hemoglobin: 11.7 g/dL — ABNORMAL LOW (ref 12.0–15.0)
MCH: 24.3 pg — ABNORMAL LOW (ref 26.0–34.0)
MCHC: 30.7 g/dL (ref 30.0–36.0)
MCV: 79.2 fL — ABNORMAL LOW (ref 80.0–100.0)
Platelets: 221 K/uL (ref 150–400)
RBC: 4.81 MIL/uL (ref 3.87–5.11)
RDW: 17.8 % — ABNORMAL HIGH (ref 11.5–15.5)
WBC: 5.6 K/uL (ref 4.0–10.5)
nRBC: 0 % (ref 0.0–0.2)

## 2024-02-23 LAB — ECHO TEE

## 2024-02-23 LAB — BASIC METABOLIC PANEL WITH GFR
Anion gap: 9 (ref 5–15)
BUN: 38 mg/dL — ABNORMAL HIGH (ref 6–20)
CO2: 24 mmol/L (ref 22–32)
Calcium: 8.6 mg/dL — ABNORMAL LOW (ref 8.9–10.3)
Chloride: 102 mmol/L (ref 98–111)
Creatinine, Ser: 1.49 mg/dL — ABNORMAL HIGH (ref 0.44–1.00)
GFR, Estimated: 41 mL/min — ABNORMAL LOW (ref >60)
Glucose, Bld: 85 mg/dL (ref 70–99)
Potassium: 4.5 mmol/L (ref 3.5–5.1)
Sodium: 135 mmol/L (ref 135–145)

## 2024-02-23 SURGERY — TRANSESOPHAGEAL ECHOCARDIOGRAM (TEE) (CATHLAB)
Anesthesia: Monitor Anesthesia Care

## 2024-02-23 MED ORDER — SODIUM CHLORIDE 0.45 % IV SOLN: INTRAVENOUS | Status: DC

## 2024-02-23 MED ORDER — PROPOFOL 10 MG/ML IV BOLUS
INTRAVENOUS | Status: DC | PRN
  Administered 2024-02-23: 70 mg via INTRAVENOUS
  Administered 2024-02-23: 100 ug/kg/min via INTRAVENOUS
  Administered 2024-02-23: 30 mg via INTRAVENOUS
  Administered 2024-02-23: 20 mg via INTRAVENOUS

## 2024-02-23 NOTE — Transfer of Care (Signed)
 Immediate Anesthesia Transfer of Care Note  Patient: Abigail Sharp  Procedure(s) Performed: TRANSESOPHAGEAL ECHOCARDIOGRAM  Patient Location: PACU and Cath Lab  Anesthesia Type:MAC  Level of Consciousness: awake and drowsy  Airway & Oxygen Therapy: Patient Spontanous Breathing and Patient connected to nasal cannula oxygen  Post-op Assessment: Report given to RN and Post -op Vital signs reviewed and stable  Post vital signs: Reviewed and stable  Last Vitals:  Vitals Value Taken Time  BP    Temp    Pulse 69 02/23/24 10:27  Resp 24 02/23/24 10:27  SpO2 93 % 02/23/24 10:27  Vitals shown include unfiled device data.  Last Pain:  Vitals:   02/23/24 0906  TempSrc:   PainSc: 0-No pain         Complications: No notable events documented.

## 2024-02-23 NOTE — Plan of Care (Signed)
   Problem: Education: Goal: Knowledge of General Education information will improve Description: Including pain rating scale, medication(s)/side effects and non-pharmacologic comfort measures Outcome: Progressing   Problem: Clinical Measurements: Goal: Ability to maintain clinical measurements within normal limits will improve Outcome: Progressing Goal: Will remain free from infection Outcome: Progressing

## 2024-02-23 NOTE — Progress Notes (Signed)
 Physical Therapy Treatment Patient Details Name: Abigail Sharp MRN: [id number] DOB: 1966-01-07 Today's Date: 02/23/2024   History of Present Illness Pt is 58 yo presenting to [facility] on 9/16 due to acute heart failure on chronic. MH: CHF, anxiety/depression, agoraphobia, HTN, nonischemic cardiomyopathy, and HFimpEF.    PT Comments  Pt with use of AD this session demonstrates improved mobility from initial evaluation; pt uses a rollator at home. Pt mod I for bed mobility, supervision for sit to stand and gait with RW. Pt is reporting today that she does have a cot at home she can sleep on and can sit on her rollator which is in a box and her son needs to put together for her. Pt states that her son is reliable and can assist her. Due to pt current functional status, home set up and available assistance at home recommending skilled physical therapy services 3x/week in order to address strength, balance and functional mobility to decrease risk for falls, injury and re-hospitalization.      If plan is discharge home, recommend the following: Assistance with cooking/housework;Assist for transportation;Help with stairs or ramp for entrance     Equipment Recommendations  None recommended by PT       Precautions / Restrictions Precautions Precautions: Fall Recall of Precautions/Restrictions: Impaired Restrictions Weight Bearing Restrictions Per Provider Order: No     Mobility  Bed Mobility Overal bed mobility: Modified Independent       General bed mobility comments: long sitting in bed, then sitting EOB at the end of session    Transfers Overall transfer level: Needs assistance Equipment used: Rolling walker (2 wheels) Transfers: Sit to/from Stand Sit to Stand: Supervision    Ambulation/Gait Ambulation/Gait assistance: Supervision Gait Distance (Feet): 250 Feet Assistive device: Rolling walker (2 wheels) Gait Pattern/deviations: Step-through pattern, Decreased stride length Gait  velocity: decreased Gait velocity interpretation: 1.31 - 2.62 ft/sec, indicative of limited community ambulator   General Gait Details: Pt with improved balance with RW from initial evaluation; pt with rollator at home      Balance Overall balance assessment: Mild deficits observed, not formally tested Sitting-balance support: Single extremity supported, Feet supported Sitting balance-Leahy Scale: Good     Standing balance support: During functional activity, Bilateral upper extremity supported, Reliant on assistive device for balance Standing balance-Leahy Scale: Fair Standing balance comment: no overt LOB; relies on AD for balance      Communication Communication Communication: No apparent difficulties  Cognition Arousal: Alert Behavior During Therapy: WFL for tasks assessed/performed   PT - Cognitive impairments: No family/caregiver present to determine baseline, Problem solving, Safety/Judgement, No apparent impairments     PT - Cognition Comments: pt reports she does have a cot Following commands: Intact      Cueing Cueing Techniques: Verbal cues     General Comments General comments (skin integrity, edema, etc.): BP after gait was 120/71; denies dizziness/lightheadedness during gait.      Pertinent Vitals/Pain Pain Assessment Pain Assessment: Faces Faces Pain Scale: Hurts a little bit Pain Location: bil knees Pain Descriptors / Indicators: Aching Pain Intervention(s): Monitored during session     PT Goals (current goals can now be found in the care plan section) Acute Rehab PT Goals Patient Stated Goal: to go home independently PT Goal Formulation: All assessment and education complete, DC therapy Time For Goal Achievement: 03/04/24 Potential to Achieve Goals: Good Progress towards PT goals: Progressing toward goals    Frequency    Min 2X/week      PT  Plan  Continue with current POC        AM-PAC PT 6 Clicks Mobility   Outcome Measure   Help needed turning from your back to your side while in a flat bed without using bedrails?: None Help needed moving from lying on your back to sitting on the side of a flat bed without using bedrails?: None Help needed moving to and from a bed to a chair (including a wheelchair)?: A Little Help needed standing up from a chair using your arms (e.g., wheelchair or bedside chair)?: A Little Help needed to walk in hospital room?: A Little Help needed climbing 3-5 steps with a railing? : A Little 6 Click Score: 20    End of Session Equipment Utilized During Treatment: Gait belt Activity Tolerance: Patient tolerated treatment well Patient left: in bed;with call bell/phone within reach Nurse Communication: Mobility status PT Visit Diagnosis: Unsteadiness on feet (R26.81);Muscle weakness (generalized) (M62.81)     Time: 8645-8591 PT Time Calculation (min) (ACUTE ONLY): 14 min  Charges:    $Therapeutic Activity: 8-22 mins PT General Charges $$ ACUTE PT VISIT: 1 Visit                    [name], DPT, CLT  Acute Rehabilitation Services Office: [phone] (Secure chat preferred)    [name] 02/23/2024, 3:40 PM

## 2024-02-23 NOTE — Progress Notes (Signed)
  Echocardiogram Echocardiogram Transesophageal has been performed.  Abigail Sharp 02/23/2024, 10:52 AM

## 2024-02-23 NOTE — Progress Notes (Signed)
 Occupational Therapy Treatment Patient Details Name: Abigail Sharp MRN: [id number] DOB: May 16, 1966 Today's Date: 02/23/2024   History of present illness Pt is 58 yo presenting to [facility] on 9/16 due to acute heart failure on chronic. MH: CHF, anxiety/depression, agoraphobia, HTN, nonischemic cardiomyopathy, and HFimpEF.   OT comments  Pt making limited progress towards OT goals this session. Able to complete transfers and sink level grooming at [facility], LB dressing with set up from the bed (in a cross legged position) LOTS of time spent talking about safety and fall risks at home as well as energy conservation in home setting. Stressed importance of IADL. Pt stating problems like I have a rollator in a box but I cannot set it up because I am too weak and get SOB Pt stating I Cannot fix a meal because I cannot breathe But without furniture or a simple chair Pt does not have a safe place to break and pace herself. Pt has been using a BSC downstairs and emptying contents into the sink. She has been bathing at her kitchen sink but gets very fatigued (evidenced by matted hair on head). OT acknowledges Pt's mental health components of agoraphobia, anxiety, and depression - there are LOTS OF RED FLAGS about Abigail Sharp being able to take care of herself at this time. I spoke with her at length about considering a post-acute rehab and the benefits, and why she should consider it. At this time she is still determined to go home to her townhome without access to her bathroom and that has no furniture. I do fear that she is at high risk or readmission or fall at this time. OT will continue to work with her acutely.      If plan is discharge home, recommend the following:  A little help with walking and/or transfers;A lot of help with bathing/dressing/bathroom   Equipment Recommendations  BSC/3in1    Recommendations for Other Services      Precautions / Restrictions Precautions Precautions: Fall Recall of  Precautions/Restrictions: Impaired Restrictions Weight Bearing Restrictions Per Provider Order: No       Mobility Bed Mobility Overal bed mobility: Modified Independent             General bed mobility comments: long sitting in bed, then sitting EOB at the end of session    Transfers Overall transfer level: Needs assistance Equipment used: None Transfers: Sit to/from Stand Sit to Stand: Contact guard assist           General transfer comment: Pt reaching out for external assist from environment     Balance Overall balance assessment: Needs assistance Sitting-balance support: Single extremity supported, Feet supported Sitting balance-Leahy Scale: Good     Standing balance support: No upper extremity supported, During functional activity Standing balance-Leahy Scale: Poor                             ADL either performed or assessed with clinical judgement   ADL Overall ADL's : Needs assistance/impaired Eating/Feeding: Sitting;Modified independent   Grooming: Contact guard assist;Standing Grooming Details (indicate cue type and reason): limited, depends on BUE support         Upper Body Dressing : Set up;Sitting Upper Body Dressing Details (indicate cue type and reason): new gown Lower Body Dressing: Set up;Bed level Lower Body Dressing Details (indicate cue type and reason): long siting in bed able to bend knees to access feet Toilet Transfer: Contact guard assist;Ambulation  General ADL Comments: Pt unable to problem solve certain OT situations    Extremity/Trunk Assessment Upper Extremity Assessment Upper Extremity Assessment: Generalized weakness   Lower Extremity Assessment Lower Extremity Assessment: Defer to PT evaluation        Vision   Vision Assessment?: No apparent visual deficits   Perception     Praxis     Communication Communication Communication: No apparent difficulties   Cognition Arousal:  Alert Behavior During Therapy: WFL for tasks assessed/performed Cognition: No family/caregiver present to determine baseline, Cognition impaired     Awareness: Online awareness impaired Memory impairment (select all impairments): Short-term memory, Working memory Attention impairment (select first level of impairment): Sustained attention Executive functioning impairment (select all impairments): Problem solving, Reasoning OT - Cognition Comments: suspect low health literacy, hx of mental health issues                 Following commands: Intact        Cueing   Cueing Techniques: Verbal cues  Exercises      Shoulder Instructions       General Comments      Pertinent Vitals/ Pain       Pain Assessment Pain Assessment: Faces Faces Pain Scale: Hurts little more Pain Location: bil knees Pain Descriptors / Indicators: Aching Pain Intervention(s): Monitored during session, Repositioned  Home Living                                          Prior Functioning/Environment              Frequency  Min 2X/week        Progress Toward Goals  OT Goals(current goals can now be found in the care plan section)  Progress towards OT goals: Progressing toward goals  Acute Rehab OT Goals Patient Stated Goal: be independent and stay independent OT Goal Formulation: With patient Time For Goal Achievement: 03/05/24 Potential to Achieve Goals: Good ADL Goals Pt Will Perform Grooming: with modified independence;standing Pt Will Perform Lower Body Dressing: with modified independence;sit to/from stand;sitting/lateral leans Pt Will Transfer to Toilet: with modified independence;ambulating;bedside commode Pt Will Perform Toileting - Clothing Manipulation and hygiene: with modified independence;sitting/lateral leans;sit to/from stand  Plan      Co-evaluation                 AM-PAC OT 6 Clicks Daily Activity     Outcome Measure   Help from  another person eating meals?: None Help from another person taking care of personal grooming?: A Little Help from another person toileting, which includes using toliet, bedpan, or urinal?: A Lot Help from another person bathing (including washing, rinsing, drying)?: A Lot Help from another person to put on and taking off regular upper body clothing?: A Little Help from another person to put on and taking off regular lower body clothing?: A Lot 6 Click Score: 16    End of Session Equipment Utilized During Treatment: Gait belt  OT Visit Diagnosis: Unsteadiness on feet (R26.81);Other abnormalities of gait and mobility (R26.89);Repeated falls (R29.6);Muscle weakness (generalized) (M62.81);History of falling (Z91.81)   Activity Tolerance Patient limited by fatigue   Patient Left in bed;with call bell/phone within reach;with bed alarm set   Nurse Communication Mobility status;Other (comment) (BP)        Time: 9049-8980 OT Time Calculation (min): 29 min  Charges: OT General Charges $OT Visit:  1 Visit OT Treatments $Self Care/Home Management : 23-37 mins  Leita DEL OTR/L Acute Rehabilitation Services Office: [phone]  Leita PARAS [facility] 02/23/2024, 2:03 PM

## 2024-02-23 NOTE — Progress Notes (Signed)
 OT Cancellation Note  Patient Details Name: Abigail Sharp MRN: [id number] DOB: Jul 31, 1965   Cancelled Treatment:    Reason Eval/Treat Not Completed: Patient at procedure or test/ unavailable (TEE) OT will continue to follow acutely and see as schedule allows.   Leita PARAS Lurena Naeve 02/23/2024, 9:10 AM  Leita DEL OTR/L Acute Rehabilitation Services Office: [phone]

## 2024-02-23 NOTE — Progress Notes (Signed)
 Progress Note  Patient Name: Abigail Sharp Date of Encounter: 02/23/2024  Primary Cardiologist: Madonna Large, DO  Subjective   Currently in endoscopy for TEE to evaluate moderate to severe MR.  Patient refused cath.  Inpatient Medications    Scheduled Meds:  [MAR Hold] apixaban   5 mg Oral BID   [MAR Hold] ARIPiprazole   5 mg Oral Daily   [MAR Hold] busPIRone   10 mg Oral BID   [MAR Hold] fluticasone   2 spray Each Nare Daily   [MAR Hold] lamoTRIgine   25 mg Oral BID   [MAR Hold] sertraline   50 mg Oral Daily   [MAR Hold] sodium chloride  flush  3 mL Intravenous Q12H   Continuous Infusions:  sodium chloride  10 mL/hr at 02/23/24 0216    PRN Meds: [MAR Hold] acetaminophen  **OR** [MAR Hold] acetaminophen , [MAR Hold] alum & mag hydroxide-simeth, [MAR Hold] hydrALAZINE , [MAR Hold] oxyCODONE , [MAR Hold] sodium chloride    Vital Signs    Vitals:   02/23/24 0215 02/23/24 0528 02/23/24 0819 02/23/24 0855  BP: 133/76  135/85 (!) 150/97  Pulse: 75  79 78  Resp: 17 (!) 24  (!) 25  Temp: 98.4 F (36.9 C)  98 F (36.7 C) 97.9 F (36.6 C)  TempSrc: Oral  Oral Temporal  SpO2: 99%  100% 96%  Weight:  125 kg    Height:        Intake/Output Summary (Last 24 hours) at 02/23/2024 9072 Last data filed at 02/23/2024 0800 Gross per 24 hour  Intake 240 ml  Output 2350 ml  Net -2110 ml      02/23/2024    5:28 AM 02/22/2024    5:56 AM 02/21/2024    3:54 AM  Last 3 Weights  Weight (lbs) 275 lb 9.2 oz 278 lb 10.6 oz 275 lb 5.7 oz  Weight (kg) 125 kg 126.4 kg 124.9 kg     Telemetry    Normal sinus rhythm- Personally Reviewed  Physical Exam   GEN: Well nourished, well developed in no acute distress HEENT: Normal NECK: No JVD; No carotid bruits LYMPHATICS: No lymphadenopathy CARDIAC:RRR, no murmurs, rubs, gallops RESPIRATORY:  Clear to auscultation without rales, wheezing or rhonchi  ABDOMEN: Soft, non-tender, non-distended MUSCULOSKELETAL:  No edema; No deformity  SKIN: Warm and  dry NEUROLOGIC:  Alert and oriented x 3 PSYCHIATRIC:  Normal affect   Labs    High Sensitivity Troponin:   Recent Labs  Lab 02/17/24 1610 02/17/24 1755 02/18/24 1710 02/18/24 2007  TROPONINIHS 26* 29* 21* 19*       Chemistry Recent Labs  Lab 02/17/24 1610 02/19/24 0342 02/20/24 0343 02/21/24 0348 02/22/24 0244 02/23/24 0403  NA 141   < > 139 139 138 135  K 4.5   < > 4.0 4.4 4.7 4.5  CL 110   < > 99 101 103 102  CO2 21*   < > 27 27 26 24   GLUCOSE 100*   < > 98 75 87 85  BUN 29*   < > 41* 36* 37* 38*  CREATININE 1.61*   < > 2.12* 1.92* 1.65* 1.49*  CALCIUM  9.1   < > 8.4* 8.9 8.9 8.6*  PROT 6.4*  --  6.0* 6.0*  --   --   ALBUMIN 3.3*  --  3.1* 3.0*  --   --   AST 23  --  17 16  --   --   ALT 24  --  16 15  --   --   ALKPHOS 76  --  67 60  --   --   BILITOT 0.7  --  0.7 0.5  --   --   GFRNONAA 37*   < > 27* 30* 36* 41*  ANIONGAP 10   < > 13 11 9 9    < > = values in this interval not displayed.     Hematology Recent Labs  Lab 02/20/24 0343 02/21/24 0348 02/23/24 0403  WBC 7.3 6.6 5.6  RBC 4.64 4.59  4.52 4.81  HGB 11.5* 11.2* 11.7*  HCT 36.2 36.3 38.1  MCV 78.0* 79.1* 79.2*  MCH 24.8* 24.4* 24.3*  MCHC 31.8 30.9 30.7  RDW 18.2* 18.4* 17.8*  PLT 240 261 221    BNP Recent Labs  Lab 02/17/24 1610  BNP 856.6*      Radiology    No results found.   Cardiac Studies   2d echo 02/19/24    1. Left ventricular ejection fraction, by estimation, is 40 to 45%. The  left ventricle has mildly decreased function. The left ventricle  demonstrates global hypokinesis. There is mild left ventricular  hypertrophy. Elevated left ventricular end-diastolic   pressure.   2. Right ventricular systolic function is normal. The right ventricular  size is normal. There is mildly elevated pulmonary artery systolic  pressure.   3. Left atrial size was severely dilated.   4. Right atrial size was moderately dilated.   5. Consider TEE to better evaluate mitral  regurgitation. The mitral valve  is normal in structure. Moderate to severe mitral valve regurgitation. No  evidence of mitral stenosis.   6. The aortic valve is tricuspid. Aortic valve regurgitation is not  visualized. No aortic stenosis is present.   7. The inferior vena cava is normal in size with greater than 50%  respiratory variability, suggesting right atrial pressure of 3 mmHg.   Patient Profile     58 y.o. female with  anxiety, agoraphobia, hypertension, nonischemic cardiomyopathy, chronic HFimpEF, morbid obesity, CKD ?stage IV. Dx with NICM EF 25% in 2016 with normal coronaries. EF in 2022 was 60-65%. Admitted with worsening CP, SOB, abdominal pain/swelling, nausea, vomiting, diaphoresis, orthopnea. Admitted with a/c CHF and also noted to have atrial flutter. Recent Cr 01/2024 was 2.1 (last 1.21 in 2021), admitted here at 1.61, also mildly anemic with neg FOBT.  Assessment & Plan    Acute on Chronic HF - Admitted with volume overload and BNP of 856 and chest x-ray with cardiomegaly with vascular congestion; - Diuresed with IV Lasix  diuretics now on hold for AKI - Serum creatinine improved from 1.65-1.49 today - O2 saturation is 96% on room air - 2D echo 02/19/2024 with EF 40 to 45% with global HK severe RAE, moderate to severe MR - she has declined LHC and RHC - Would benefit from addition of ARB/ARNI/MRA since renal function is improving - Will repeat BMP in the morning and if serum creatinine continues to improve then we will start a low-dose ARB  Atypical CP (worse with palpitation) Elevated troponin - low/flat troponin not felt to reflect ACS (26>> 29>> 21>> 19) - prior cath 2016 with normal coronaries - Patient refused cath   AKI on CKD stage IIIa  - Serum creatinine improved from 1.65 to 1.49 today (peaked at 2.12 on 02/20/2024)  - will need home maintenance diuretic at DC - AKI is improving currently   Paroxysmal atrial flutter -Normal sinus rhythm on exam   -Continue apixaban  5 mg twice daily  Mild microcytic anemia - FOBT negative, no bleeding  reported  Super morbid Morbid obesity - consider sleep study as OP   QT prolongation - 503 ms; psych meds as per psych team  I spent 30 minutes caring for this patient today face to face, ordering and reviewing labs, reviewing records from 02/19/2024, seeing the patient, documenting in the record  For questions or updates, please contact [facility] Please consult [email or web address] for contact info under Cardiology/STEMI.  Wilbert Bihari, MD [facility] 02/23/2024

## 2024-02-23 NOTE — Progress Notes (Signed)
 PROGRESS NOTE Abigail Sharp  FMW:[id number] DOB: 1966/05/20 DOA: 02/17/2024 PCP: Pcp, No  Brief Narrative/Hospital Course: Abigail Sharp is a 58 y.o. female with PMH of hypertension, HFpEF, anxiety presenting with chest pain and shortness of breath.symoptoms going on for months aand worse with activity, some orthopnea. She gets pain all over on walking. Never had sleep apnea. She also c/o chest pain when she wakes up as she cannont breather and feels lie  heart coming out of chest  reports having cath in 2016 and no stent needed. She also c/o nasal stuffiness, cough for sometime. She has lots of complaints feeling hot. [name]  has been having nausea. Patient otherwise denies any nausea, vomiting,fever, chills, headache, focal weakness, numbness tingling, speech difficulties.  She endorses weight gain was 277 lb on 8/1 and repeat pending. In the ED:BP poorly controlled 160/110, saturating 90 to 97% on room air, heart rate 102 afebrile somewhat tachypneic in 20s EKG without STEMI.  Hemoglobin 11.2, FOBT negative, creatinine 1.6 (stable), troponin 26 > 29, BNP 856.  UA STABLE Chest x-ray showing vascular congestion and possible tiny pleural effusions.  Patient was given aspirin , IV Lasix  40 mg, and morphine  and admission was requested for CHF exacerbation Cardiology was consulted placed on heparin  for anticoagulation for A-fib, echo showed reduced EF 40-45% with moderate to severe MR. Patient was diuresed, also needing IV fluid support   Subjective: Seen and examined Overnight afebrile BP stable She feels fine no nausea vomiting chest pain fever chills Requesting nasal spray Getting nightly Oxy for chest pain Labs this morning shows creatinine further improved 2.1>1.9> 1.6 She just back from TEE, denies nausea vomiting chest pain  Assessment and plan:  Acute on chronic CHF with reduced ejection fraction and moderate to severe functional MR HX of NICM/Chest pain Orthostatic hypotension Elevated  troponin- likely due to demand ischemia-EKG nonischemic Severe MR: Patient is noncompliant,recently seen by Dr. Michele on 8/1 weight when in August was 277 pounds on admission to 284 lb Seen by cardiology, underwent IV diuretics 80 bid 9/18  but with BP orthostatic got 500 cc bolus 9/19.Lasix  on hold TTE :EF 40-45%, GHNK LV ,RV SF, moderate to severe MR, also having intermittent chest pain although EKG nonischemic troponin flat> per cardiology> underwent continued this morning found to have severe MR, await further cardiac recommendation She will likely need home diuretics on discharge Cont to monitor daily I/O,weight, electrolytes  w/ salt/fluid restricted diet and monitor in tele  Net IO Since Admission: -10,084.36 mL [02/23/24 1129]  Filed Weights   02/21/24 0354 02/22/24 0556 02/23/24 0528  Weight: 124.9 kg 126.4 kg 125 kg    Recent Labs  Lab 02/17/24 1610 02/19/24 0342 02/20/24 0343 02/21/24 0348 02/22/24 0244 02/23/24 0403  BNP 856.6*  --   --   --   --   --   BUN 29* 30* 41* 36* 37* 38*  CREATININE 1.61* 1.83* 2.12* 1.92* 1.65* 1.49*  K 4.5 4.4 4.0 4.4 4.7 4.5  MG  --   --  1.8 1.8  --   --    AKI on CKD 3B vs CKD 4: Last creatinine in August was 2.1 although previously up to 1.2.  Overall holding stable with some improvement as below.  Monitor Recent Labs    01/02/24 1504 01/02/24 2353 02/17/24 1610 02/19/24 0342 02/20/24 0343 02/21/24 0348 02/22/24 0244 02/23/24 0403  BUN 39*  --  29* 30* 41* 36* 37* 38*  CREATININE 2.18*  --  1.61* 1.83* 2.12* 1.92*  1.65* 1.49*  CO2 21*  --  21* 26 27 27 26 24   K 4.8   < > 4.5 4.4 4.0 4.4 4.7 4.5   < > = values in this interval not displayed.    New onset Atrial flutter: Continue Eliquis , off Coreg  due to hypotension.TSH normal  Prolonged Qtc: QTc 9/20 477 previously 503 on 9/19.  Celexa  dose has been decreased.  Monitor  Microcytic anemia Hemoglobin stable   Anxiety/depression/OCD: Mood stable.non compliant with psych  meds at home seen by psychiatry> reintroduced solids, continue Abilify , BuSpar  Lamictal  and Zoloft  slightly lower dose due to prolonged QTc  Morbid Obesity w/ Body mass index is 47.3 kg/m.: Will benefit with PCP follow-up, weight loss,healthy lifestyle and outpatient sleep eval if not done.  Mobility: PT Orders:  PT Follow up Rec: Home Health Pt9/18/2025 1545   DVT prophylaxis:  Code Status:   Code Status: Full Code Family Communication: plan of care discussed with patient at bedside. Patient status is: Remains hospitalized because of severity of illness Level of care: Telemetry Cardiac   Dispo: The patient is from: home            Anticipated disposition: Pending cardiology clearance  Objective: Vitals last 24 hrs: Vitals:   02/23/24 1035 02/23/24 1040 02/23/24 1045 02/23/24 1112  BP: 130/69 132/76 132/72 (!) 148/80  Pulse: 65 65 66 69  Resp: 15 13 16 20   Temp:    97.7 F (36.5 C)  TempSrc:    Oral  SpO2: 98% 98% 97% 100%  Weight:      Height:        Physical Examination: General exam: AAOX3 HEENT:Oral mucosa moist, Ear/Nose WNL grossly Respiratory system: Bilaterally clear BS,no use of accessory muscle Cardiovascular system: S1 & S2 +, No JVD. Gastrointestinal system: Abdomen soft,NT,ND, BS+ Nervous System: Alert, awake, moving all extremities,and following commands. Extremities: extremities warm, leg edema + Skin: No rashes,no icterus. MSK: Normal muscle bulk,tone, power   Medications reviewed:  Scheduled Meds:  apixaban   5 mg Oral BID   ARIPiprazole   5 mg Oral Daily   busPIRone   10 mg Oral BID   fluticasone   2 spray Each Nare Daily   lamoTRIgine   25 mg Oral BID   sertraline   50 mg Oral Daily   sodium chloride  flush  3 mL Intravenous Q12H   Continuous Infusions:   Diet: Diet Order             Diet regular Room service appropriate? Yes; Fluid consistency: Thin  Diet effective now                    Data Reviewed: I have personally reviewed  following labs and imaging studies ( see epic result tab) CBC: Recent Labs  Lab 02/17/24 1610 02/20/24 0343 02/21/24 0348 02/23/24 0403  WBC 6.1 7.3 6.6 5.6  HGB 11.2* 11.5* 11.2* 11.7*  HCT 35.6* 36.2 36.3 38.1  MCV 79.3* 78.0* 79.1* 79.2*  PLT 235 240 261 221   CMP: Recent Labs  Lab 02/19/24 0342 02/20/24 0343 02/21/24 0348 02/22/24 0244 02/23/24 0403  NA 138 139 139 138 135  K 4.4 4.0 4.4 4.7 4.5  CL 100 99 101 103 102  CO2 26 27 27 26 24   GLUCOSE 97 98 75 87 85  BUN 30* 41* 36* 37* 38*  CREATININE 1.83* 2.12* 1.92* 1.65* 1.49*  CALCIUM  8.6* 8.4* 8.9 8.9 8.6*  MG  --  1.8 1.8  --   --  GFR: Estimated Creatinine Clearance: 54.5 mL/min (A) (by C-G formula based on SCr of 1.49 mg/dL (H)). Recent Labs  Lab 02/17/24 1610 02/20/24 0343 02/21/24 0348  AST 23 17 16   ALT 24 16 15   ALKPHOS 76 67 60  BILITOT 0.7 0.7 0.5  PROT 6.4* 6.0* 6.0*  ALBUMIN 3.3* 3.1* 3.0*   No results for input(s): LIPASE, AMYLASE in the last 168 hours. No results for input(s): AMMONIA in the last 168 hours. Coagulation Profile: No results for input(s): INR, PROTIME in the last 168 hours. Unresulted Labs (From admission, onward)     Start     Ordered   02/22/24 0500  Basic metabolic panel with GFR  Daily,   R     Question:  Specimen collection method  Answer:  Lab=Lab collect   02/21/24 0954   02/18/24 1442  Creatinine clearance, urine, 24 hour  (Creatinine Clearance, urine, 24-hour panel)  Once,   R        02/18/24 1441   02/18/24 1442  Draw extra clot tube  (Creatinine Clearance, urine, 24-hour panel)  Once,   R        02/18/24 1441           Antimicrobials/Microbiology: Anti-infectives (From admission, onward)    None         Component Value Date/Time   SDES URINE, CLEAN CATCH 01/03/2024 0025   SPECREQUEST  01/03/2024 0025    NONE Performed at [facility], [address] [place], [place] [address]    CULT 70,000 COLONIES/mL ESCHERICHIA COLI (A)  01/03/2024 0025   REPTSTATUS 01/05/2024 FINAL 01/03/2024 0025   Procedures: Procedure(s) (LRB): TRANSESOPHAGEAL ECHOCARDIOGRAM (N/A) Mennie LAMY, MD [facility] 02/23/2024, 11:29 AM

## 2024-02-23 NOTE — Interval H&P Note (Signed)
 History and Physical Interval Note:  02/23/2024 9:34 AM  Abigail Sharp  has presented today for surgery, with the diagnosis of MR.  The various methods of treatment have been discussed with the patient and family. After consideration of risks, benefits and other options for treatment, the patient has consented to  Procedure(s): TRANSESOPHAGEAL ECHOCARDIOGRAM (N/A) as a surgical intervention.  The patient's history has been reviewed, patient examined, no change in status, stable for surgery.  I have reviewed the patient's chart and labs.  Questions were answered to the patient's satisfaction.     Lonni LITTIE Nanas

## 2024-02-23 NOTE — CV Procedure (Addendum)
     TRANSESOPHAGEAL ECHOCARDIOGRAM   NAME:  Abigail Sharp   MRN: [id number] DOB:  Sep 26, 1965   ADMIT DATE: 02/17/2024  INDICATIONS: MR  PROCEDURE:   Informed consent was obtained prior to the procedure. The risks, benefits and alternatives for the procedure were discussed and the patient comprehended these risks.  Risks include, but are not limited to, cough, sore throat, vomiting, nausea, somnolence, esophageal and stomach trauma or perforation, bleeding, low blood pressure, aspiration, pneumonia, infection, trauma to the teeth and death.    After a procedural time-out, the oropharynx was anesthetized and the patient was sedated by the anesthesia service. The transesophageal probe was inserted in the esophagus and stomach without difficulty and multiple views were obtained. Anesthesia was monitored by Jackee Peng, CRNA.    COMPLICATIONS:    There were no immediate complications.  FINDINGS:  Moderate LV systolic dysfunction.  Small PFO.  2 MR jets, with total 3D VCA 0.5cm^2 and there is pulmonary vein systolic flow reversal in RLPV, consistent with severe MR  Lonni Nanas MD [place]  [facility]    10:28 AM

## 2024-02-23 NOTE — Progress Notes (Signed)
 PT Cancellation Note  Patient Details Name: Abigail Sharp MRN: [id number] DOB: 09/11/1965   Cancelled Treatment:    Reason Eval/Treat Not Completed: Patient at procedure or test/unavailable (will follow up as able and appropriate.)  [name], DPT, CLT  Acute Rehabilitation Services Office: [phone] (Secure chat preferred)   [name] 02/23/2024, 9:18 AM

## 2024-02-24 ENCOUNTER — Encounter (HOSPITAL_COMMUNITY): Payer: Self-pay | Admitting: Cardiology

## 2024-02-24 DIAGNOSIS — N179 Acute kidney failure, unspecified: Secondary | ICD-10-CM | POA: Diagnosis not present

## 2024-02-24 DIAGNOSIS — N1832 Chronic kidney disease, stage 3b: Secondary | ICD-10-CM

## 2024-02-24 DIAGNOSIS — R7989 Other specified abnormal findings of blood chemistry: Secondary | ICD-10-CM | POA: Diagnosis not present

## 2024-02-24 DIAGNOSIS — R0789 Other chest pain: Secondary | ICD-10-CM | POA: Diagnosis not present

## 2024-02-24 DIAGNOSIS — I5031 Acute diastolic (congestive) heart failure: Secondary | ICD-10-CM | POA: Diagnosis not present

## 2024-02-24 DIAGNOSIS — I5023 Acute on chronic systolic (congestive) heart failure: Secondary | ICD-10-CM | POA: Diagnosis not present

## 2024-02-24 DIAGNOSIS — I4892 Unspecified atrial flutter: Secondary | ICD-10-CM

## 2024-02-24 LAB — CBC
HCT: 37.9 % (ref 36.0–46.0)
Hemoglobin: 11.8 g/dL — ABNORMAL LOW (ref 12.0–15.0)
MCH: 24.7 pg — ABNORMAL LOW (ref 26.0–34.0)
MCHC: 31.1 g/dL (ref 30.0–36.0)
MCV: 79.5 fL — ABNORMAL LOW (ref 80.0–100.0)
Platelets: 209 K/uL (ref 150–400)
RBC: 4.77 MIL/uL (ref 3.87–5.11)
RDW: 18.2 % — ABNORMAL HIGH (ref 11.5–15.5)
WBC: 5.1 K/uL (ref 4.0–10.5)
nRBC: 0 % (ref 0.0–0.2)

## 2024-02-24 LAB — BASIC METABOLIC PANEL WITH GFR
Anion gap: 11 (ref 5–15)
BUN: 38 mg/dL — ABNORMAL HIGH (ref 6–20)
CO2: 26 mmol/L (ref 22–32)
Calcium: 8.9 mg/dL (ref 8.9–10.3)
Chloride: 104 mmol/L (ref 98–111)
Creatinine, Ser: 1.51 mg/dL — ABNORMAL HIGH (ref 0.44–1.00)
GFR, Estimated: 40 mL/min — ABNORMAL LOW (ref >60)
Glucose, Bld: 83 mg/dL (ref 70–99)
Potassium: 4.4 mmol/L (ref 3.5–5.1)
Sodium: 141 mmol/L (ref 135–145)

## 2024-02-24 MED ORDER — SODIUM CHLORIDE 0.9 % IV SOLN: INTRAVENOUS | Status: DC

## 2024-02-24 MED ORDER — HEPARIN (PORCINE) 25000 UT/250ML-% IV SOLN
1550.0000 [IU]/h | INTRAVENOUS | Status: DC
  Administered 2024-02-24: 1300 [IU]/h via INTRAVENOUS
  Filled 2024-02-24: qty 250

## 2024-02-24 MED ORDER — METOPROLOL TARTRATE 25 MG PO TABS
25.0000 mg | ORAL_TABLET | Freq: Two times a day (BID) | ORAL | Status: DC
  Administered 2024-02-24 – 2024-02-25 (×2): 25 mg via ORAL
  Filled 2024-02-24 (×2): qty 1

## 2024-02-24 MED ORDER — METOPROLOL TARTRATE 25 MG PO TABS: 25.0000 mg | ORAL_TABLET | Freq: Four times a day (QID) | ORAL | Status: DC

## 2024-02-24 MED ORDER — FREE WATER: 250.0000 mL | Freq: Once | Status: DC

## 2024-02-24 MED ORDER — ASPIRIN 81 MG PO CHEW
81.0000 mg | CHEWABLE_TABLET | ORAL | Status: AC
  Administered 2024-02-25: 81 mg via ORAL
  Filled 2024-02-24: qty 1

## 2024-02-24 MED ORDER — METOPROLOL TARTRATE 5 MG/5ML IV SOLN
2.5000 mg | Freq: Once | INTRAVENOUS | Status: AC
  Administered 2024-02-24: 2.5 mg via INTRAVENOUS
  Filled 2024-02-24: qty 5

## 2024-02-24 MED ORDER — ASPIRIN 81 MG PO CHEW: 81.0000 mg | CHEWABLE_TABLET | ORAL | Status: DC

## 2024-02-24 MED ORDER — LOSARTAN POTASSIUM 25 MG PO TABS: 25.0000 mg | ORAL_TABLET | Freq: Every day | ORAL | Status: DC

## 2024-02-24 NOTE — Progress Notes (Signed)
 PROGRESS NOTE Abigail Sharp  FMW:[id number] DOB: 06/18/1965 DOA: 02/17/2024 PCP: Pcp, No  Brief Narrative/Hospital Course: Abigail Sharp is a 58 y.o. female with PMH of hypertension, HFpEF, anxiety presenting with chest pain and shortness of breath.symoptoms going on for months aand worse with activity, some orthopnea. She gets pain all over on walking. Never had sleep apnea. She also c/o chest pain when she wakes up as she cannont breather and feels lie  heart coming out of chest  reports having cath in 2016 and no stent needed. She also c/o nasal stuffiness, cough for sometime. She has lots of complaints feeling hot. [name]  has been having nausea. Patient otherwise denies any nausea, vomiting,fever, chills, headache, focal weakness, numbness tingling, speech difficulties.  She endorses weight gain was 277 lb on 8/1 and repeat pending. In the ED:BP poorly controlled 160/110, saturating 90 to 97% on room air, heart rate 102 afebrile somewhat tachypneic in 20s EKG without STEMI.  Hemoglobin 11.2, FOBT negative, creatinine 1.6 (stable), troponin 26 > 29, BNP 856.  UA STABLE Chest x-ray showing vascular congestion and possible tiny pleural effusions.  Patient was given aspirin , IV Lasix  40 mg, and morphine  and admission was requested for CHF exacerbation Cardiology was consulted placed on heparin  for anticoagulation for A-fib, echo showed reduced EF 40-45% with moderate to severe MR. Patient was diuresed, also needing IV fluid support Patient has been transitioned on oral DOAC and tolerating well.  Underwent TEE 9/22 showed EF 35-40% moderately reduced RV systolic function, severe MR, EF 35-40%.  Subjective: Seen and examined overnight afebrile BP stable on room air Labs reviewed creatinine at 1.5 on admission 1.9 Current weight at 274 pounds previously as high as 284 on admit She denies chest pain nausea vomiting fever chills.  Assessment and plan:  Acute on chronic HFrEF w. EF 35-40% Severe  MR NICM hx Chest pain /w elevated troponin likely demand ischemia Orthostatic hypotension: Patient is noncompliant,recently seen by Dr. Michele on 8/1 weight when in August was 277 pounds on admission to 284 lb Seen by cardiology, underwent IV diuretics 80 bid 9/18  but with BP orthostatic got 500 cc bolus 9/19.Lasix  on hold TTE :EF 40-45%, GHNK LV ,RV SF, moderate to severe MR, also having intermittent chest pain although EKG nonischemic troponin flat> per cardiology> underwent continued this morning found to have severe MR EF 35-40% Cardiology planning for right and left heart cath with goal of ultimate valve replacement versus mitral valve clip Cont to monitor daily I/O,weight, electrolytes  w/ salt/fluid restricted diet and monitor in tele  Net IO Since Admission: -10,264.36 mL [02/24/24 1028]  Filed Weights   02/22/24 0556 02/23/24 0528 02/24/24 0556  Weight: 126.4 kg 125 kg 124.3 kg    Recent Labs  Lab 02/17/24 1610 02/19/24 0342 02/20/24 0343 02/21/24 0348 02/22/24 0244 02/23/24 0403 02/24/24 0313  BNP 856.6*  --   --   --   --   --   --   BUN 29*   < > 41* 36* 37* 38* 38*  CREATININE 1.61*   < > 2.12* 1.92* 1.65* 1.49* 1.51*  K 4.5   < > 4.0 4.4 4.7 4.5 4.4  MG  --   --  1.8 1.8  --   --   --    < > = values in this interval not displayed.   AKI on CKD 3B vs CKD 4: Last creatinine in August was 2.1 although previously up to 1.2.  Overall holding stable with some  improvement as below.  Monitor Recent Labs    01/02/24 1504 01/02/24 2353 02/17/24 1610 02/19/24 0342 02/20/24 0343 02/21/24 0348 02/22/24 0244 02/23/24 0403 02/24/24 0313  BUN 39*  --  29* 30* 41* 36* 37* 38* 38*  CREATININE 2.18*  --  1.61* 1.83* 2.12* 1.92* 1.65* 1.49* 1.51*  CO2 21*  --  21* 26 27 27 26 24 26   K 4.8   < > 4.5 4.4 4.0 4.4 4.7 4.5 4.4   < > = values in this interval not displayed.    New onset Atrial flutter: Continue Eliquis -holding for cath.off Coreg  due to hypotension.TSH  normal  Prolonged Qtc: QTc 9/20 477 previously 503 on 9/19.  Celexa  dose has been decreased.  Monitor  Microcytic anemia Hemoglobin stable   Anxiety/depression/OCD: Mood stable.non compliant with psych meds at home seen by psychiatry> reintroduced solids, continue Abilify , BuSpar  Lamictal  and Zoloft  slightly lower dose due to prolonged QTc  Morbid Obesity w/ Body mass index is 47.04 kg/m.: Will benefit with PCP follow-up, weight loss,healthy lifestyle and outpatient sleep eval if not done.  Mobility: PT Orders:  PT Follow up Rec: Home Health Pt9/22/2025 1538   DVT prophylaxis:  Code Status:   Code Status: Full Code Family Communication: plan of care discussed with patient at bedside. Patient status is: Remains hospitalized because of severity of illness Level of care: Telemetry Cardiac   Dispo: The patient is from: home            Anticipated disposition: Home with home health once cleared from cardiology   Objective: Vitals last 24 hrs: Vitals:   02/24/24 0315 02/24/24 0556 02/24/24 0728 02/24/24 0851  BP: 133/68  (!) 123/57 138/76  Pulse: 81 82 85 80  Resp: 20 (!) 23 (!) 21 18  Temp: 97.7 F (36.5 C)  97.7 F (36.5 C) 97.7 F (36.5 C)  TempSrc: Oral  Oral Oral  SpO2: 94% 92% 96% 98%  Weight:  124.3 kg    Height:        Physical Examination: General exam: AAOX3, obese HEENT:Oral mucosa moist, Ear/Nose WNL grossly Respiratory system: Bilaterally clear BS,no use of accessory muscle Cardiovascular system: S1 & S2 +, No JVD. Gastrointestinal system: Abdomen soft,NT,ND, BS+ Nervous System: Alert, awake, moving all extremities,and following commands. Extremities: extremities warm, leg edema + Skin: No rashes,no icterus. MSK: Normal muscle bulk,tone, power   Medications reviewed:  Scheduled Meds:  ARIPiprazole   5 mg Oral Daily   busPIRone   10 mg Oral BID   fluticasone   2 spray Each Nare Daily   lamoTRIgine   25 mg Oral BID   sertraline   50 mg Oral Daily    sodium chloride  flush  3 mL Intravenous Q12H   Continuous Infusions:   Diet: Diet Order             Diet NPO time specified Except for: Sips with Meds  Diet effective midnight                    Data Reviewed: I have personally reviewed following labs and imaging studies ( see epic result tab) CBC: Recent Labs  Lab 02/17/24 1610 02/20/24 0343 02/21/24 0348 02/23/24 0403 02/24/24 0313  WBC 6.1 7.3 6.6 5.6 5.1  HGB 11.2* 11.5* 11.2* 11.7* 11.8*  HCT 35.6* 36.2 36.3 38.1 37.9  MCV 79.3* 78.0* 79.1* 79.2* 79.5*  PLT 235 240 261 221 209   CMP: Recent Labs  Lab 02/20/24 0343 02/21/24 0348 02/22/24 0244 02/23/24 0403 02/24/24 9686  NA 139 139 138 135 141  K 4.0 4.4 4.7 4.5 4.4  CL 99 101 103 102 104  CO2 27 27 26 24 26   GLUCOSE 98 75 87 85 83  BUN 41* 36* 37* 38* 38*  CREATININE 2.12* 1.92* 1.65* 1.49* 1.51*  CALCIUM  8.4* 8.9 8.9 8.6* 8.9  MG 1.8 1.8  --   --   --    GFR: Estimated Creatinine Clearance: 53.5 mL/min (A) (by C-G formula based on SCr of 1.51 mg/dL (H)). Recent Labs  Lab 02/17/24 1610 02/20/24 0343 02/21/24 0348  AST 23 17 16   ALT 24 16 15   ALKPHOS 76 67 60  BILITOT 0.7 0.7 0.5  PROT 6.4* 6.0* 6.0*  ALBUMIN 3.3* 3.1* 3.0*   No results for input(s): LIPASE, AMYLASE in the last 168 hours. No results for input(s): AMMONIA in the last 168 hours. Coagulation Profile: No results for input(s): INR, PROTIME in the last 168 hours. Unresulted Labs (From admission, onward)     Start     Ordered   02/22/24 0500  Basic metabolic panel with GFR  Daily,   R     Question:  Specimen collection method  Answer:  Lab=Lab collect   02/21/24 0954   02/18/24 1442  Creatinine clearance, urine, 24 hour  (Creatinine Clearance, urine, 24-hour panel)  Once,   R        02/18/24 1441   02/18/24 1442  Draw extra clot tube  (Creatinine Clearance, urine, 24-hour panel)  Once,   R        02/18/24 1441   Signed and Held  Basic metabolic panel  Tomorrow morning,    R       Question:  Specimen collection method  Answer:  Lab=Lab collect   Signed and Held   Signed and Held  CBC  Tomorrow morning,   R       Question:  Specimen collection method  Answer:  Lab=Lab collect   Signed and Held           Antimicrobials/Microbiology: Anti-infectives (From admission, onward)    None         Component Value Date/Time   SDES URINE, CLEAN CATCH 01/03/2024 0025   SPECREQUEST  01/03/2024 0025    NONE Performed at [facility], [address] [place], [place] [address]    CULT 70,000 COLONIES/mL ESCHERICHIA COLI (A) 01/03/2024 0025   REPTSTATUS 01/05/2024 FINAL 01/03/2024 0025   Procedures: Procedure(s) (LRB): TRANSESOPHAGEAL ECHOCARDIOGRAM (N/A) Mennie LAMY, MD [facility] 02/24/2024, 10:28 AM

## 2024-02-24 NOTE — Progress Notes (Signed)
 Progress Note  Patient Name: Abigail Sharp Date of Encounter: 02/24/2024  Primary Cardiologist: Madonna Large, DO  Subjective   Had TEE yesterday showing EF 35 to 40% with global HK, moderate RV dysfunction, small pericardial effusion, PFO and severe MR by 3D  Denies any chest pain or shortness of breath  Inpatient Medications    Scheduled Meds:  apixaban   5 mg Oral BID   ARIPiprazole   5 mg Oral Daily   busPIRone   10 mg Oral BID   fluticasone   2 spray Each Nare Daily   lamoTRIgine   25 mg Oral BID   sertraline   50 mg Oral Daily   sodium chloride  flush  3 mL Intravenous Q12H   Continuous Infusions:    PRN Meds: acetaminophen  **OR** acetaminophen , alum & mag hydroxide-simeth, hydrALAZINE , sodium chloride    Vital Signs    Vitals:   02/24/24 0000 02/24/24 0315 02/24/24 0556 02/24/24 0728  BP:  133/68  (!) 123/57  Pulse:  81 82 85  Resp: 20 20 (!) 23 (!) 21  Temp:  97.7 F (36.5 C)  97.7 F (36.5 C)  TempSrc:  Oral  Oral  SpO2: 92% 94% 92% 96%  Weight:   124.3 kg   Height:        Intake/Output Summary (Last 24 hours) at 02/24/2024 0909 Last data filed at 02/24/2024 0800 Gross per 24 hour  Intake 320 ml  Output 500 ml  Net -180 ml      02/24/2024    5:56 AM 02/23/2024    5:28 AM 02/22/2024    5:56 AM  Last 3 Weights  Weight (lbs) 274 lb 0.5 oz 275 lb 9.2 oz 278 lb 10.6 oz  Weight (kg) 124.3 kg 125 kg 126.4 kg     Telemetry    Normal sinus rhythm- Personally Reviewed  Physical Exam   GEN: Well nourished, well developed in no acute distress HEENT: Normal NECK: No JVD; No carotid bruits LYMPHATICS: No lymphadenopathy CARDIAC:RRR, no  rubs, gallops 2/6 systolic murmur at the left lower sternal border to apex RESPIRATORY:  Clear to auscultation without rales, wheezing or rhonchi  ABDOMEN: Soft, non-tender, non-distended MUSCULOSKELETAL:  No edema; No deformity  SKIN: Warm and dry NEUROLOGIC:  Alert and oriented x 3 PSYCHIATRIC:  Normal affect  Labs     High Sensitivity Troponin:   Recent Labs  Lab 02/17/24 1610 02/17/24 1755 02/18/24 1710 02/18/24 2007  TROPONINIHS 26* 29* 21* 19*       Chemistry Recent Labs  Lab 02/17/24 1610 02/19/24 0342 02/20/24 0343 02/21/24 0348 02/22/24 0244 02/23/24 0403 02/24/24 0313  NA 141   < > 139 139 138 135 141  K 4.5   < > 4.0 4.4 4.7 4.5 4.4  CL 110   < > 99 101 103 102 104  CO2 21*   < > 27 27 26 24 26   GLUCOSE 100*   < > 98 75 87 85 83  BUN 29*   < > 41* 36* 37* 38* 38*  CREATININE 1.61*   < > 2.12* 1.92* 1.65* 1.49* 1.51*  CALCIUM  9.1   < > 8.4* 8.9 8.9 8.6* 8.9  PROT 6.4*  --  6.0* 6.0*  --   --   --   ALBUMIN 3.3*  --  3.1* 3.0*  --   --   --   AST 23  --  17 16  --   --   --   ALT 24  --  16 15  --   --   --  ALKPHOS 76  --  67 60  --   --   --   BILITOT 0.7  --  0.7 0.5  --   --   --   GFRNONAA 37*   < > 27* 30* 36* 41* 40*  ANIONGAP 10   < > 13 11 9 9 11    < > = values in this interval not displayed.     Hematology Recent Labs  Lab 02/21/24 0348 02/23/24 0403 02/24/24 0313  WBC 6.6 5.6 5.1  RBC 4.59  4.52 4.81 4.77  HGB 11.2* 11.7* 11.8*  HCT 36.3 38.1 37.9  MCV 79.1* 79.2* 79.5*  MCH 24.4* 24.3* 24.7*  MCHC 30.9 30.7 31.1  RDW 18.4* 17.8* 18.2*  PLT 261 221 209    BNP Recent Labs  Lab 02/17/24 1610  BNP 856.6*      Radiology    ECHO TEE Result Date: 02/23/2024    TRANSESOPHOGEAL ECHO REPORT   Patient Name:   MALAYJA FREUND Date of Exam: 02/23/2024 Medical Rec #:  [id number]     Height:       64.0 in Accession #:    [id number]    Weight:       275.6 lb Date of Birth:  March 26, 1966      BSA:          2.242 m Patient Age:    58 years      BP:           153/95 mmHg Patient Gender: F             HR:           75 bpm. Exam Location:  Inpatient Procedure: Transesophageal Echo, 3D Echo, Cardiac Doppler, Color Doppler and            Saline Contrast Bubble Study (Both Spectral and Color Flow Doppler            were utilized during procedure). Indications:     I34.0  Nonrheumatic mitral (valve) insufficiency  History:         Patient has prior history of Echocardiogram examinations, most                  recent 02/19/2024.  Sonographer:     Ellouise Mose RDCS Referring Phys:  [id number] LEONTINE SAILOR LOCKWOOD Diagnosing Phys: Lonni Nanas MD PROCEDURE: After discussion of the risks and benefits of a TEE, an informed consent was obtained from the patient. The transesophogeal probe was passed without difficulty through the esophogus of the patient. Imaged were obtained with the patient in a left lateral decubitus position. Sedation performed by different physician. The patient was monitored while under deep sedation. Anesthestetic sedation was provided intravenously by Anesthesiology: 450mg  of Propofol . The patient's vital signs; including heart rate, blood pressure, and oxygen saturation; remained stable throughout the procedure. The patient developed no complications during the procedure.  IMPRESSIONS  1. Left ventricular ejection fraction, by estimation, is 35 to 40%. The left ventricle has moderately decreased function. The left ventricle demonstrates global hypokinesis. The left ventricular internal cavity size was moderately dilated.  2. Right ventricular systolic function is moderately reduced. The right ventricular size is mildly enlarged.  3. Left atrial size was moderately dilated. No left atrial/left atrial appendage thrombus was detected.  4. Right atrial size was mildly dilated.  5. A small pericardial effusion is present.  6. The aortic valve is tricuspid. Aortic valve regurgitation is not visualized. No aortic stenosis is present.  7. Evidence of atrial level shunting detected by color flow Doppler. Agitated saline contrast bubble study was positive with shunting observed within 3-6 cardiac cycles suggestive of interatrial shunt. There is a small patent foramen ovale.  8. 3D performed of the mitral valve and demonstrates severe MR.  9. The mitral valve is abnormal. Severe  mitral valve regurgitation. MR appears functional. There are 2 central MR jets, with total 3D VCA 0.5cm^2; in addition, there is RLPV systolic flow reversal. This is consistent with severe MR FINDINGS  Left Ventricle: Left ventricular ejection fraction, by estimation, is 35 to 40%. The left ventricle has moderately decreased function. The left ventricle demonstrates global hypokinesis. The left ventricular internal cavity size was moderately dilated. Right Ventricle: The right ventricular size is mildly enlarged. No increase in right ventricular wall thickness. Right ventricular systolic function is moderately reduced. Left Atrium: Left atrial size was moderately dilated. No left atrial/left atrial appendage thrombus was detected. Right Atrium: Right atrial size was mildly dilated. Pericardium: A small pericardial effusion is present. Mitral Valve: The mitral valve is abnormal. Severe mitral valve regurgitation. Tricuspid Valve: The tricuspid valve is normal in structure. Tricuspid valve regurgitation is mild . No evidence of tricuspid stenosis. Aortic Valve: The aortic valve is tricuspid. Aortic valve regurgitation is not visualized. No aortic stenosis is present. Pulmonic Valve: The pulmonic valve was grossly normal. Pulmonic valve regurgitation is mild. No evidence of pulmonic stenosis. Aorta: The aortic root and ascending aorta are structurally normal, with no evidence of dilitation. IAS/Shunts: Evidence of atrial level shunting detected by color flow Doppler. Agitated saline contrast was given intravenously to evaluate for intracardiac shunting. Agitated saline contrast bubble study was positive with shunting observed within 3-6 cardiac cycles suggestive of interatrial shunt. A small patent foramen ovale is detected. Additional Comments: 3D was performed not requiring image post processing on an independent workstation and was abnormal. Spectral Doppler performed. MV E velocity: 94.00 cm/s Lonni Nanas  MD Electronically signed by Lonni Nanas MD Signature Date/Time: 02/23/2024/1:59:49 PM    Final    EP STUDY Result Date: 02/23/2024 See surgical note for result.    Cardiac Studies   2d echo 02/19/24    1. Left ventricular ejection fraction, by estimation, is 40 to 45%. The  left ventricle has mildly decreased function. The left ventricle  demonstrates global hypokinesis. There is mild left ventricular  hypertrophy. Elevated left ventricular end-diastolic   pressure.   2. Right ventricular systolic function is normal. The right ventricular  size is normal. There is mildly elevated pulmonary artery systolic  pressure.   3. Left atrial size was severely dilated.   4. Right atrial size was moderately dilated.   5. Consider TEE to better evaluate mitral regurgitation. The mitral valve  is normal in structure. Moderate to severe mitral valve regurgitation. No  evidence of mitral stenosis.   6. The aortic valve is tricuspid. Aortic valve regurgitation is not  visualized. No aortic stenosis is present.   7. The inferior vena cava is normal in size with greater than 50%  respiratory variability, suggesting right atrial pressure of 3 mmHg.   Patient Profile     58 y.o. female with  anxiety, agoraphobia, hypertension, nonischemic cardiomyopathy, chronic HFimpEF, morbid obesity, CKD ?stage IV. Dx with NICM EF 25% in 2016 with normal coronaries. EF in 2022 was 60-65%. Admitted with worsening CP, SOB, abdominal pain/swelling, nausea, vomiting, diaphoresis, orthopnea. Admitted with a/c CHF and also noted to have atrial flutter. Recent Cr 01/2024 was  2.1 (last 1.21 in 2021), admitted here at 1.61, also mildly anemic with neg FOBT.  Assessment & Plan    Acute on Chronic HF - Admitted with volume overload and BNP of 856 and chest x-ray with cardiomegaly with vascular congestion; - Diuresed with IV Lasix  diuretics now on hold for AKI - Serum creatinine improved from 1.65->>1.49->> 1.51  today - O2 saturation is 98 % on room air - 2D echo 02/19/2024 with EF 40 to 45% with global HK severe RAE, moderate to severe MR - TEE yesterday showed severe MR with pulmonary vein flow reversal as well as EF 35 to 40% and RV dysfunction - Discussed the results with the patient and recommended that we proceed with a right and left heart cath with the goal of ultimate valve replacement versus mitral valve clip>>will plan for 9/24 in the afternoon since got a dose of Eliquis  this am -Informed Consent   Shared Decision Making/Informed Consent The risks [stroke (1 in 1000), death (1 in 1000), kidney failure [usually temporary] (1 in 500), bleeding (1 in 200), allergic reaction [possibly serious] (1 in 200)], benefits (diagnostic support and management of coronary artery disease) and alternatives of a cardiac catheterization were discussed in detail with Ms. Najera and she is willing to proceed. - Would benefit from addition of ARB/ARNI/MRA since renal function is improving - Start losartan  25 mg daily after cath and follow renal function closely - Ultimately will place on low-dose carvedilol  and asked SGLT2 as well if renal function allows  Atypical CP (worse with palpitation) Elevated troponin - low/flat troponin not felt to reflect ACS (26>> 29>> 21>> 19) - prior cath 2016 with normal coronaries - see above>>getting [facility] tomorrow   AKI on CKD stage IIIa  - Serum creatinine improved 1.65>>1.49>> 1.51 today (peaked at 2.12 on 02/20/2024)  - will need home maintenance diuretic at DC   Paroxysmal atrial flutter - Maintaining normal sinus rhythm on exam - hold Eliquis  for cath tomorrow and start IV Heparin  gtt per pharmacy  Mild microcytic anemia - FOBT negative, no bleeding reported  Super morbid Morbid obesity - consider sleep study as OP   QT prolongation - 503 ms; psych meds as per psych team  I spent 40 minutes caring for this patient today face to face, ordering and reviewing  labs, reviewing records from 02/19/2024 reviewing TEE, seeing the patient, documenting in the record and setting up right and left heart cath  For questions or updates, please contact [facility] Please consult [email or web address] for contact info under Cardiology/STEMI.  Wilbert Bihari, MD [facility] 02/23/2024

## 2024-02-24 NOTE — Plan of Care (Signed)
   Problem: Education: Goal: Knowledge of General Education information will improve Description Including pain rating scale, medication(s)/side effects and non-pharmacologic comfort measures Outcome: Progressing

## 2024-02-24 NOTE — Progress Notes (Signed)
 PHARMACY - ANTICOAGULATION CONSULT NOTE  Pharmacy Consult for heparin  Indication: atrial fibrillation  Allergies  Allergen Reactions   Sulfa Antibiotics Other (See Comments)    Childhood allergy with unknown reaction    Patient Measurements: Height: 5' 4 (162.6 cm) Weight: 124.3 kg (274 lb 0.5 oz) IBW/kg (Calculated) : 54.7 HEPARIN  DW (KG): 85.6  Vital Signs: Temp: 97.7 F (36.5 C) (09/23 0851) Temp Source: Oral (09/23 0851) BP: 138/76 (09/23 0851) Pulse Rate: 80 (09/23 0851)  Labs: Recent Labs    02/22/24 0244 02/23/24 0403 02/24/24 0313  HGB  --  11.7* 11.8*  HCT  --  38.1 37.9  PLT  --  221 209  CREATININE 1.65* 1.49* 1.51*    Estimated Creatinine Clearance: 53.5 mL/min (A) (by C-G formula based on SCr of 1.51 mg/dL (H)).   Medical History: Past Medical History:  Diagnosis Date   Anxiety    Chronic systolic heart failure (HCC)    Depression    HTN (hypertension)    Microcytic anemia    NICM (nonischemic cardiomyopathy) (HCC) 11/2014   EF is 25% improved to 55% in 2015.   Pre-diabetes       Assessment: 89 yoF admitted with CHF and AF started on apixaban . Pt now will need L/RHC so apixaban  transitioned to heparin . LD of apixaban  this am ~0800.  Goal of Therapy:  Heparin  level 0.3-0.7 units/ml aPTT 66-102 seconds Monitor platelets by anticoagulation protocol: Yes   Plan:  Heparin  1300 units/h no bolus starting at 2000 tonight Check aPTT and heparin  level at 0600 9/24  Ozell Jamaica, PharmD, BCPS, [facility] Clinical Pharmacist [phone] Please check AMION for all [facility] numbers 02/24/2024

## 2024-02-24 NOTE — Progress Notes (Signed)
 I was informed by the nurse that the patient went into AF with RVR with rates mostly in the 110s. She's asymptomatic. She had already received one dose of metoprolol  IV 2.5 mg at 8:45PM and was started on heparin  gtt (last dose of apixaban  was this AM). Most recent BP is 149/101.  - Agree with heparin  gtt  - Ordered metoprolol  tartrate 25 mg BID for rate control   Gillian Cass, MD Cardiology

## 2024-02-24 NOTE — Anesthesia Postprocedure Evaluation (Signed)
 Anesthesia Post Note  Patient: Amonie Wisser  Procedure(s) Performed: TRANSESOPHAGEAL ECHOCARDIOGRAM     Patient location during evaluation: PACU Anesthesia Type: MAC Level of consciousness: awake and alert Pain management: pain level controlled Vital Signs Assessment: post-procedure vital signs reviewed and stable Respiratory status: spontaneous breathing, nonlabored ventilation, respiratory function stable and patient connected to nasal cannula oxygen Cardiovascular status: blood pressure returned to baseline and stable Postop Assessment: no apparent nausea or vomiting Anesthetic complications: no   No notable events documented.  Last Vitals:  Vitals:   02/24/24 0728 02/24/24 0851  BP: (!) 123/57 138/76  Pulse: 85 80  Resp: (!) 21 18  Temp: 36.5 C 36.5 C  SpO2: 96% 98%    Last Pain:  Vitals:   02/24/24 0851  TempSrc: Oral  PainSc: 0-No pain                 Trumaine Wimer L Nicki Furlan

## 2024-02-25 ENCOUNTER — Encounter (HOSPITAL_COMMUNITY): Payer: Self-pay | Admitting: Cardiology

## 2024-02-25 ENCOUNTER — Encounter (HOSPITAL_COMMUNITY)
Admission: EM | Disposition: A | Discharge status: Home Health | Payer: Self-pay | Source: Home / Self Care | Attending: Internal Medicine

## 2024-02-25 ENCOUNTER — Other Ambulatory Visit: Payer: Self-pay

## 2024-02-25 DIAGNOSIS — I5031 Acute diastolic (congestive) heart failure: Secondary | ICD-10-CM | POA: Diagnosis not present

## 2024-02-25 DIAGNOSIS — I2510 Atherosclerotic heart disease of native coronary artery without angina pectoris: Secondary | ICD-10-CM | POA: Diagnosis not present

## 2024-02-25 DIAGNOSIS — I5041 Acute combined systolic (congestive) and diastolic (congestive) heart failure: Secondary | ICD-10-CM | POA: Diagnosis not present

## 2024-02-25 DIAGNOSIS — I4892 Unspecified atrial flutter: Secondary | ICD-10-CM | POA: Diagnosis not present

## 2024-02-25 DIAGNOSIS — I340 Nonrheumatic mitral (valve) insufficiency: Secondary | ICD-10-CM

## 2024-02-25 HISTORY — PX: RIGHT/LEFT HEART CATH AND CORONARY ANGIOGRAPHY: CATH118266

## 2024-02-25 LAB — CBC
HCT: 41.2 % (ref 36.0–46.0)
Hemoglobin: 12.8 g/dL (ref 12.0–15.0)
MCH: 24.6 pg — ABNORMAL LOW (ref 26.0–34.0)
MCHC: 31.1 g/dL (ref 30.0–36.0)
MCV: 79.1 fL — ABNORMAL LOW (ref 80.0–100.0)
Platelets: 222 K/uL (ref 150–400)
RBC: 5.21 MIL/uL — ABNORMAL HIGH (ref 3.87–5.11)
RDW: 18.1 % — ABNORMAL HIGH (ref 11.5–15.5)
WBC: 6 K/uL (ref 4.0–10.5)
nRBC: 0 % (ref 0.0–0.2)

## 2024-02-25 LAB — POCT I-STAT EG7
Acid-base deficit: 1 mmol/L (ref 0.0–2.0)
Acid-base deficit: 2 mmol/L (ref 0.0–2.0)
Acid-base deficit: 2 mmol/L (ref 0.0–2.0)
Acid-base deficit: 1 mmol/L (ref 0.0–2.0)
Acid-base deficit: 2 mmol/L (ref 0.0–2.0)
Bicarbonate: 23.9 mmol/L (ref 20.0–28.0)
Bicarbonate: 24.6 mmol/L (ref 20.0–28.0)
Bicarbonate: 25.1 mmol/L (ref 20.0–28.0)
Bicarbonate: 24.3 mmol/L (ref 20.0–28.0)
Bicarbonate: 24.7 mmol/L (ref 20.0–28.0)
Calcium, Ion: 1.25 mmol/L (ref 1.15–1.40)
Calcium, Ion: 1.26 mmol/L (ref 1.15–1.40)
Calcium, Ion: 1.27 mmol/L (ref 1.15–1.40)
Calcium, Ion: 1.27 mmol/L (ref 1.15–1.40)
Calcium, Ion: 1.27 mmol/L (ref 1.15–1.40)
HCT: 39 % (ref 36.0–46.0)
HCT: 39 % (ref 36.0–46.0)
HCT: 39 % (ref 36.0–46.0)
HCT: 38 % (ref 36.0–46.0)
HCT: 38 % (ref 36.0–46.0)
Hemoglobin: 13.3 g/dL (ref 12.0–15.0)
Hemoglobin: 13.3 g/dL (ref 12.0–15.0)
Hemoglobin: 13.3 g/dL (ref 12.0–15.0)
Hemoglobin: 12.9 g/dL (ref 12.0–15.0)
Hemoglobin: 12.9 g/dL (ref 12.0–15.0)
O2 Saturation: 48 %
O2 Saturation: 52 %
O2 Saturation: 57 %
O2 Saturation: 52 %
O2 Saturation: 59 %
Potassium: 4.6 mmol/L (ref 3.5–5.1)
Potassium: 4.6 mmol/L (ref 3.5–5.1)
Potassium: 4.7 mmol/L (ref 3.5–5.1)
Potassium: 4.6 mmol/L (ref 3.5–5.1)
Potassium: 4.6 mmol/L (ref 3.5–5.1)
Sodium: 137 mmol/L (ref 135–145)
Sodium: 139 mmol/L (ref 135–145)
Sodium: 139 mmol/L (ref 135–145)
Sodium: 139 mmol/L (ref 135–145)
Sodium: 139 mmol/L (ref 135–145)
TCO2: 25 mmol/L (ref 22–32)
TCO2: 26 mmol/L (ref 22–32)
TCO2: 26 mmol/L (ref 22–32)
TCO2: 26 mmol/L (ref 22–32)
TCO2: 26 mmol/L (ref 22–32)
pCO2, Ven: 45.3 mmHg (ref 44–60)
pCO2, Ven: 46.2 mmHg (ref 44–60)
pCO2, Ven: 46.5 mmHg (ref 44–60)
pCO2, Ven: 44.8 mmHg (ref 44–60)
pCO2, Ven: 46.2 mmHg (ref 44–60)
pH, Ven: 7.33 (ref 7.25–7.43)
pH, Ven: 7.335 (ref 7.25–7.43)
pH, Ven: 7.339 (ref 7.25–7.43)
pH, Ven: 7.336 (ref 7.25–7.43)
pH, Ven: 7.344 (ref 7.25–7.43)
pO2, Ven: 28 mmHg — CL (ref 32–45)
pO2, Ven: 30 mmHg — CL (ref 32–45)
pO2, Ven: 32 mmHg (ref 32–45)
pO2, Ven: 30 mmHg — CL (ref 32–45)
pO2, Ven: 33 mmHg (ref 32–45)

## 2024-02-25 LAB — BASIC METABOLIC PANEL WITH GFR
Anion gap: 11 (ref 5–15)
BUN: 37 mg/dL — ABNORMAL HIGH (ref 6–20)
CO2: 22 mmol/L (ref 22–32)
Calcium: 8.9 mg/dL (ref 8.9–10.3)
Chloride: 104 mmol/L (ref 98–111)
Creatinine, Ser: 1.4 mg/dL — ABNORMAL HIGH (ref 0.44–1.00)
GFR, Estimated: 44 mL/min — ABNORMAL LOW (ref >60)
Glucose, Bld: 88 mg/dL (ref 70–99)
Potassium: 4.6 mmol/L (ref 3.5–5.1)
Sodium: 137 mmol/L (ref 135–145)

## 2024-02-25 LAB — TROPONIN I (HIGH SENSITIVITY)
Troponin I (High Sensitivity): 18 ng/L — ABNORMAL HIGH (ref <18)
Troponin I (High Sensitivity): 20 ng/L — ABNORMAL HIGH (ref <18)

## 2024-02-25 LAB — POCT I-STAT 7, (LYTES, BLD GAS, ICA,H+H)
Acid-base deficit: 3 mmol/L — ABNORMAL HIGH (ref 0.0–2.0)
Bicarbonate: 21.6 mmol/L (ref 20.0–28.0)
Calcium, Ion: 1.24 mmol/L (ref 1.15–1.40)
HCT: 39 % (ref 36.0–46.0)
Hemoglobin: 13.3 g/dL (ref 12.0–15.0)
O2 Saturation: 97 %
Potassium: 4.7 mmol/L (ref 3.5–5.1)
Sodium: 138 mmol/L (ref 135–145)
TCO2: 23 mmol/L (ref 22–32)
pCO2 arterial: 35.6 mmHg (ref 32–48)
pH, Arterial: 7.391 (ref 7.35–7.45)
pO2, Arterial: 90 mmHg (ref 83–108)

## 2024-02-25 LAB — APTT: aPTT: 57 s — ABNORMAL HIGH (ref 24–36)

## 2024-02-25 LAB — HEPARIN LEVEL (UNFRACTIONATED): Heparin Unfractionated: 0.91 [IU]/mL — ABNORMAL HIGH (ref 0.30–0.70)

## 2024-02-25 SURGERY — RIGHT/LEFT HEART CATH AND CORONARY ANGIOGRAPHY
Anesthesia: LOCAL

## 2024-02-25 MED ORDER — FUROSEMIDE 10 MG/ML IJ SOLN
80.0000 mg | Freq: Two times a day (BID) | INTRAMUSCULAR | Status: DC
  Administered 2024-02-26: 80 mg via INTRAVENOUS
  Filled 2024-02-25 (×2): qty 8

## 2024-02-25 MED ORDER — LABETALOL HCL 5 MG/ML IV SOLN: 10.0000 mg | INTRAVENOUS | Status: AC | PRN

## 2024-02-25 MED ORDER — METOPROLOL TARTRATE 50 MG PO TABS
50.0000 mg | ORAL_TABLET | Freq: Two times a day (BID) | ORAL | Status: DC
Start: 2024-02-25 — End: 2024-02-25

## 2024-02-25 MED ORDER — FUROSEMIDE 10 MG/ML IJ SOLN
INTRAMUSCULAR | Status: DC | PRN
  Administered 2024-02-25: 40 mg via INTRAVENOUS

## 2024-02-25 MED ORDER — FREE WATER
250.0000 mL | Freq: Once | Status: AC
  Administered 2024-02-25: 250 mL via ORAL

## 2024-02-25 MED ORDER — ACETAMINOPHEN 325 MG PO TABS: 650.0000 mg | ORAL_TABLET | ORAL | Status: DC | PRN

## 2024-02-25 MED ORDER — SODIUM CHLORIDE 0.9% FLUSH: 3.0000 mL | INTRAVENOUS | Status: DC | PRN

## 2024-02-25 MED ORDER — HEPARIN SODIUM (PORCINE) 1000 UNIT/ML IJ SOLN
INTRAMUSCULAR | Status: AC
  Filled 2024-02-25: qty 10

## 2024-02-25 MED ORDER — SODIUM CHLORIDE 0.9% FLUSH
3.0000 mL | Freq: Two times a day (BID) | INTRAVENOUS | Status: DC
Start: 2024-02-25 — End: 2024-02-27
  Administered 2024-02-26 (×2): 3 mL via INTRAVENOUS

## 2024-02-25 MED ORDER — VERAPAMIL HCL 2.5 MG/ML IV SOLN
INTRAVENOUS | Status: AC
  Filled 2024-02-25: qty 2

## 2024-02-25 MED ORDER — FUROSEMIDE 10 MG/ML IJ SOLN
INTRAMUSCULAR | Status: AC
  Filled 2024-02-25: qty 4

## 2024-02-25 MED ORDER — DIGOXIN 125 MCG PO TABS
0.1250 mg | ORAL_TABLET | Freq: Every day | ORAL | Status: DC
  Administered 2024-02-25 – 2024-03-03 (×8): 0.125 mg via ORAL
  Filled 2024-02-25 (×8): qty 1

## 2024-02-25 MED ORDER — SODIUM CHLORIDE 0.9 % IV SOLN: 250.0000 mL | INTRAVENOUS | Status: AC | PRN

## 2024-02-25 MED ORDER — VERAPAMIL HCL 2.5 MG/ML IV SOLN
INTRAVENOUS | Status: DC | PRN
  Administered 2024-02-25: 10 mL via INTRA_ARTERIAL

## 2024-02-25 MED ORDER — OXYCODONE HCL 5 MG PO TABS
5.0000 mg | ORAL_TABLET | Freq: Once | ORAL | Status: AC | PRN
  Administered 2024-02-25: 5 mg via ORAL
  Filled 2024-02-25: qty 1

## 2024-02-25 MED ORDER — FUROSEMIDE 10 MG/ML IJ SOLN
40.0000 mg | Freq: Once | INTRAMUSCULAR | Status: AC
  Administered 2024-02-25: 40 mg via INTRAVENOUS
  Filled 2024-02-25: qty 4

## 2024-02-25 MED ORDER — HEPARIN (PORCINE) 25000 UT/250ML-% IV SOLN
1550.0000 [IU]/h | INTRAVENOUS | Status: AC
  Administered 2024-02-25: 1550 [IU]/h via INTRAVENOUS
  Filled 2024-02-25: qty 250

## 2024-02-25 MED ORDER — HEPARIN SODIUM (PORCINE) 1000 UNIT/ML IJ SOLN
INTRAMUSCULAR | Status: DC | PRN
  Administered 2024-02-25: 5000 [IU] via INTRAVENOUS

## 2024-02-25 MED ORDER — LIDOCAINE HCL (PF) 1 % IJ SOLN
INTRAMUSCULAR | Status: DC | PRN
  Administered 2024-02-25 (×2): 2 mL

## 2024-02-25 MED ORDER — IOHEXOL 350 MG/ML SOLN
INTRAVENOUS | Status: DC | PRN
  Administered 2024-02-25: 75 mL

## 2024-02-25 MED ORDER — METOPROLOL TARTRATE 25 MG PO TABS
25.0000 mg | ORAL_TABLET | Freq: Once | ORAL | Status: AC
Start: 2024-02-25 — End: 2024-02-25
  Administered 2024-02-25: 25 mg via ORAL
  Filled 2024-02-25: qty 1

## 2024-02-25 MED ORDER — FUROSEMIDE 10 MG/ML IJ SOLN: 40.0000 mg | Freq: Two times a day (BID) | INTRAMUSCULAR | Status: DC

## 2024-02-25 MED ORDER — AMIODARONE HCL 200 MG PO TABS
400.0000 mg | ORAL_TABLET | Freq: Two times a day (BID) | ORAL | Status: AC
  Administered 2024-02-25 – 2024-03-01 (×11): 400 mg via ORAL
  Filled 2024-02-25 (×11): qty 2

## 2024-02-25 MED ORDER — SACUBITRIL-VALSARTAN 24-26 MG PO TABS
1.0000 | ORAL_TABLET | Freq: Two times a day (BID) | ORAL | Status: DC
  Administered 2024-02-25 – 2024-02-26 (×2): 1 via ORAL
  Filled 2024-02-25 (×2): qty 1

## 2024-02-25 MED ORDER — HEPARIN (PORCINE) IN NACL 1000-0.9 UT/500ML-% IV SOLN
INTRAVENOUS | Status: DC | PRN
  Administered 2024-02-25 (×2): 500 mL

## 2024-02-25 MED ORDER — LIDOCAINE HCL (PF) 1 % IJ SOLN
INTRAMUSCULAR | Status: AC
  Filled 2024-02-25: qty 30

## 2024-02-25 SURGICAL SUPPLY — 12 items
CATH BALLN WEDGE 5F 110CM (CATHETERS) IMPLANT
CATH INFINITI 5 FR JL3.5 (CATHETERS) IMPLANT
CATH INFINITI AMBI 5FR TG (CATHETERS) IMPLANT
DEVICE RAD COMP TR BAND LRG (VASCULAR PRODUCTS) IMPLANT
GLIDESHEATH SLEND SS 6F .021 (SHEATH) IMPLANT
GUIDEWIRE INQWIRE 1.5J.035X260 (WIRE) IMPLANT
KIT SINGLE USE MANIFOLD (KITS) IMPLANT
MAT PREVALON FULL STRYKER (MISCELLANEOUS) IMPLANT
PACK CARDIAC CATHETERIZATION (CUSTOM PROCEDURE TRAY) ×1 IMPLANT
SET ATX-X65L (MISCELLANEOUS) IMPLANT
SHEATH GLIDE SLENDER 4/5FR (SHEATH) IMPLANT
SHEATH PROBE COVER 6X72 (BAG) IMPLANT

## 2024-02-25 NOTE — Plan of Care (Signed)
  Problem: Education: Goal: Knowledge of General Education information will improve Description: Including pain rating scale, medication(s)/side effects and non-pharmacologic comfort measures Outcome: Progressing   Problem: Health Behavior/Discharge Planning: Goal: Ability to manage health-related needs will improve Outcome: Progressing   Problem: Clinical Measurements: Goal: Ability to maintain clinical measurements within normal limits will improve Outcome: Progressing Goal: Will remain free from infection Outcome: Progressing Goal: Diagnostic test results will improve Outcome: Progressing Goal: Respiratory complications will improve Outcome: Progressing Goal: Cardiovascular complication will be avoided Outcome: Progressing   Problem: Activity: Goal: Risk for activity intolerance will decrease Outcome: Progressing   Problem: Nutrition: Goal: Adequate nutrition will be maintained Outcome: Progressing   Problem: Coping: Goal: Level of anxiety will decrease Outcome: Progressing   Problem: Elimination: Goal: Will not experience complications related to bowel motility Outcome: Progressing Goal: Will not experience complications related to urinary retention Outcome: Progressing   Problem: Pain Managment: Goal: General experience of comfort will improve and/or be controlled Outcome: Progressing   Problem: Safety: Goal: Ability to remain free from injury will improve Outcome: Progressing   Problem: Skin Integrity: Goal: Risk for impaired skin integrity will decrease Outcome: Progressing   Problem: Education: Goal: Ability to demonstrate management of disease process will improve Outcome: Progressing Goal: Ability to verbalize understanding of medication therapies will improve Outcome: Progressing   Problem: Activity: Goal: Capacity to carry out activities will improve Outcome: Progressing   Problem: Cardiac: Goal: Ability to achieve and maintain adequate  cardiopulmonary perfusion will improve Outcome: Progressing   Problem: Education: Goal: Understanding of CV disease, CV risk reduction, and recovery process will improve Outcome: Progressing Goal: Individualized Educational Video(s) Outcome: Progressing   Problem: Activity: Goal: Ability to return to baseline activity level will improve Outcome: Progressing   Problem: Cardiovascular: Goal: Ability to achieve and maintain adequate cardiovascular perfusion will improve Outcome: Progressing Goal: Vascular access site(s) Level 0-1 will be maintained Outcome: Progressing   Problem: Health Behavior/Discharge Planning: Goal: Ability to safely manage health-related needs after discharge will improve Outcome: Progressing

## 2024-02-25 NOTE — Progress Notes (Signed)
 Progress Note  Patient Name: Abigail Sharp Date of Encounter: 02/25/2024  Primary Cardiologist: Madonna Large, DO  Subjective   Had TEE this admit showing EF 35 to 40% with global HK, moderate RV dysfunction, small pericardial effusion, PFO and severe MR by 3D  Complained of  chest pain this morning  Went into atrial flutter with RVR this am.   Inpatient Medications    Scheduled Meds:  ARIPiprazole   5 mg Oral Daily   busPIRone   10 mg Oral BID   fluticasone   2 spray Each Nare Daily   free water   250 mL Oral Once   lamoTRIgine   25 mg Oral BID   metoprolol  tartrate  25 mg Oral BID   sertraline   50 mg Oral Daily   sodium chloride  flush  3 mL Intravenous Q12H   Continuous Infusions:  sodium chloride  10 mL/hr at 02/25/24 0906   heparin  1,550 Units/hr (02/25/24 0859)     PRN Meds: acetaminophen  **OR** acetaminophen , alum & mag hydroxide-simeth, hydrALAZINE , sodium chloride    Vital Signs    Vitals:   02/25/24 0000 02/25/24 0300 02/25/24 0422 02/25/24 0826  BP:  132/75 127/62 116/80  Pulse:  94 94 (!) 52  Resp: 20 18 15  (!) 22  Temp:  98 F (36.7 C) 97.9 F (36.6 C) 98 F (36.7 C)  TempSrc:  Oral Oral Oral  SpO2:  98% 98% 96%  Weight:      Height:        Intake/Output Summary (Last 24 hours) at 02/25/2024 0932 Last data filed at 02/25/2024 0900 Gross per 24 hour  Intake 290.14 ml  Output 3150 ml  Net -2859.86 ml      02/24/2024    3:10 PM 02/24/2024    5:56 AM 02/23/2024    5:28 AM  Last 3 Weights  Weight (lbs) 276 lb 10.8 oz 274 lb 0.5 oz 275 lb 9.2 oz  Weight (kg) 125.5 kg 124.3 kg 125 kg     Telemetry    Normal sinus rhythm- Personally Reviewed  Physical Exam   GEN: Well nourished, well developed in no acute distress HEENT: Normal NECK: No JVD; No carotid bruits LYMPHATICS: No lymphadenopathy CARDIAC:irregularly irregular and tachy, no  rubs, gallops Harsh 2/6 systolic murmur at the left lower sternal border to the apex RESPIRATORY:  Clear to  auscultation without rales, wheezing or rhonchi  ABDOMEN: Soft, non-tender, non-distended MUSCULOSKELETAL:  No edema; No deformity  SKIN: Warm and dry NEUROLOGIC:  Alert and oriented x 3 PSYCHIATRIC:  Normal affect  Labs    High Sensitivity Troponin:   Recent Labs  Lab 02/17/24 1755 02/18/24 1710 02/18/24 2007 02/25/24 0331 02/25/24 0729  TROPONINIHS 29* 21* 19* 18* 20*       Chemistry Recent Labs  Lab 02/20/24 0343 02/21/24 0348 02/22/24 0244 02/23/24 0403 02/24/24 0313 02/25/24 0729  NA 139 139   < > 135 141 137  K 4.0 4.4   < > 4.5 4.4 4.6  CL 99 101   < > 102 104 104  CO2 27 27   < > 24 26 22   GLUCOSE 98 75   < > 85 83 88  BUN 41* 36*   < > 38* 38* 37*  CREATININE 2.12* 1.92*   < > 1.49* 1.51* 1.40*  CALCIUM  8.4* 8.9   < > 8.6* 8.9 8.9  PROT 6.0* 6.0*  --   --   --   --   ALBUMIN 3.1* 3.0*  --   --   --   --  AST 17 16  --   --   --   --   ALT 16 15  --   --   --   --   ALKPHOS 67 60  --   --   --   --   BILITOT 0.7 0.5  --   --   --   --   GFRNONAA 27* 30*   < > 41* 40* 44*  ANIONGAP 13 11   < > 9 11 11    < > = values in this interval not displayed.     Hematology Recent Labs  Lab 02/23/24 0403 02/24/24 0313 02/25/24 0729  WBC 5.6 5.1 6.0  RBC 4.81 4.77 5.21*  HGB 11.7* 11.8* 12.8  HCT 38.1 37.9 41.2  MCV 79.2* 79.5* 79.1*  MCH 24.3* 24.7* 24.6*  MCHC 30.7 31.1 31.1  RDW 17.8* 18.2* 18.1*  PLT 221 209 222    BNP No results for input(s): BNP, PROBNP in the last 168 hours.     Radiology    ECHO TEE Result Date: 02/23/2024    TRANSESOPHOGEAL ECHO REPORT   Patient Name:   Abigail Sharp Date of Exam: 02/23/2024 Medical Rec #:  [id number]     Height:       64.0 in Accession #:    [id number]    Weight:       275.6 lb Date of Birth:  May 21, 1966      BSA:          2.242 m Patient Age:    58 years      BP:           153/95 mmHg Patient Gender: F             HR:           75 bpm. Exam Location:  Inpatient Procedure: Transesophageal Echo, 3D Echo,  Cardiac Doppler, Color Doppler and            Saline Contrast Bubble Study (Both Spectral and Color Flow Doppler            were utilized during procedure). Indications:     I34.0 Nonrheumatic mitral (valve) insufficiency  History:         Patient has prior history of Echocardiogram examinations, most                  recent 02/19/2024.  Sonographer:     Ellouise Mose RDCS Referring Phys:  [id number] LEONTINE SAILOR LOCKWOOD Diagnosing Phys: Lonni Nanas MD PROCEDURE: After discussion of the risks and benefits of a TEE, an informed consent was obtained from the patient. The transesophogeal probe was passed without difficulty through the esophogus of the patient. Imaged were obtained with the patient in a left lateral decubitus position. Sedation performed by different physician. The patient was monitored while under deep sedation. Anesthestetic sedation was provided intravenously by Anesthesiology: 450mg  of Propofol . The patient's vital signs; including heart rate, blood pressure, and oxygen saturation; remained stable throughout the procedure. The patient developed no complications during the procedure.  IMPRESSIONS  1. Left ventricular ejection fraction, by estimation, is 35 to 40%. The left ventricle has moderately decreased function. The left ventricle demonstrates global hypokinesis. The left ventricular internal cavity size was moderately dilated.  2. Right ventricular systolic function is moderately reduced. The right ventricular size is mildly enlarged.  3. Left atrial size was moderately dilated. No left atrial/left atrial appendage thrombus was detected.  4. Right atrial size was mildly dilated.  5. A small pericardial effusion is present.  6. The aortic valve is tricuspid. Aortic valve regurgitation is not visualized. No aortic stenosis is present.  7. Evidence of atrial level shunting detected by color flow Doppler. Agitated saline contrast bubble study was positive with shunting observed within 3-6 cardiac  cycles suggestive of interatrial shunt. There is a small patent foramen ovale.  8. 3D performed of the mitral valve and demonstrates severe MR.  9. The mitral valve is abnormal. Severe mitral valve regurgitation. MR appears functional. There are 2 central MR jets, with total 3D VCA 0.5cm^2; in addition, there is RLPV systolic flow reversal. This is consistent with severe MR FINDINGS  Left Ventricle: Left ventricular ejection fraction, by estimation, is 35 to 40%. The left ventricle has moderately decreased function. The left ventricle demonstrates global hypokinesis. The left ventricular internal cavity size was moderately dilated. Right Ventricle: The right ventricular size is mildly enlarged. No increase in right ventricular wall thickness. Right ventricular systolic function is moderately reduced. Left Atrium: Left atrial size was moderately dilated. No left atrial/left atrial appendage thrombus was detected. Right Atrium: Right atrial size was mildly dilated. Pericardium: A small pericardial effusion is present. Mitral Valve: The mitral valve is abnormal. Severe mitral valve regurgitation. Tricuspid Valve: The tricuspid valve is normal in structure. Tricuspid valve regurgitation is mild . No evidence of tricuspid stenosis. Aortic Valve: The aortic valve is tricuspid. Aortic valve regurgitation is not visualized. No aortic stenosis is present. Pulmonic Valve: The pulmonic valve was grossly normal. Pulmonic valve regurgitation is mild. No evidence of pulmonic stenosis. Aorta: The aortic root and ascending aorta are structurally normal, with no evidence of dilitation. IAS/Shunts: Evidence of atrial level shunting detected by color flow Doppler. Agitated saline contrast was given intravenously to evaluate for intracardiac shunting. Agitated saline contrast bubble study was positive with shunting observed within 3-6 cardiac cycles suggestive of interatrial shunt. A small patent foramen ovale is detected. Additional  Comments: 3D was performed not requiring image post processing on an independent workstation and was abnormal. Spectral Doppler performed. MV E velocity: 94.00 cm/s Lonni Nanas MD Electronically signed by Lonni Nanas MD Signature Date/Time: 02/23/2024/1:59:49 PM    Final    EP STUDY Result Date: 02/23/2024 See surgical note for result.    Cardiac Studies   2d echo 02/19/24    1. Left ventricular ejection fraction, by estimation, is 40 to 45%. The  left ventricle has mildly decreased function. The left ventricle  demonstrates global hypokinesis. There is mild left ventricular  hypertrophy. Elevated left ventricular end-diastolic   pressure.   2. Right ventricular systolic function is normal. The right ventricular  size is normal. There is mildly elevated pulmonary artery systolic  pressure.   3. Left atrial size was severely dilated.   4. Right atrial size was moderately dilated.   5. Consider TEE to better evaluate mitral regurgitation. The mitral valve  is normal in structure. Moderate to severe mitral valve regurgitation. No  evidence of mitral stenosis.   6. The aortic valve is tricuspid. Aortic valve regurgitation is not  visualized. No aortic stenosis is present.   7. The inferior vena cava is normal in size with greater than 50%  respiratory variability, suggesting right atrial pressure of 3 mmHg.   Patient Profile     58 y.o. female with  anxiety, agoraphobia, hypertension, nonischemic cardiomyopathy, chronic HFimpEF, morbid obesity, CKD ?stage IV. Dx with NICM EF 25% in 2016 with normal coronaries. EF in 2022 was 60-65%.  Admitted with worsening CP, SOB, abdominal pain/swelling, nausea, vomiting, diaphoresis, orthopnea. Admitted with a/c CHF and also noted to have atrial flutter. Recent Cr 01/2024 was 2.1 (last 1.21 in 2021), admitted here at 1.61, also mildly anemic with neg FOBT.  Assessment & Plan    Acute on Chronic HF - Admitted with volume overload and  BNP of 856 and chest x-ray with cardiomegaly with vascular congestion; - Diuresed with IV Lasix  diuretics now on hold for AKI - Serum creatinine improved from 1.65->>1.49->> 1.51->> 1.4 today - O2 saturation is 96% on room air - 2D echo 02/19/2024 with EF 40 to 45% with global HK severe RAE, moderate to severe MR - TEE 9/22 showed severe MR with pulmonary vein flow reversal as well as EF 35 to 40% and RV dysfunction - Discussed the results with the patient and recommended that we proceed with a right and left heart cath with the goal of ultimate valve replacement versus mitral valve clip -Patient is on for right and left heart cath later this afternoon after holding 2 doses of Eliquis  -Informed Consent   Shared Decision Making/Informed Consent The risks [stroke (1 in 1000), death (1 in 1000), kidney failure [usually temporary] (1 in 500), bleeding (1 in 200), allergic reaction [possibly serious] (1 in 200)], benefits (diagnostic support and management of coronary artery disease) and alternatives of a cardiac catheterization were discussed in detail with Ms. Guevarra and she is willing to proceed. - Would benefit from addition of ARB/ARNI/MRA since renal function is improving - Start losartan  25 mg daily after cath and follow renal function closely - Ultimately will place on low-dose carvedilol  and asked SGLT2 as well if renal function allows  Atypical CP (worse with palpitation) Elevated troponin - low/flat troponin not felt to reflect ACS (26>> 29>> 21>> 19) - prior cath 2016 with normal coronaries - Plan for right and left heart cath today to define coronary anatomy as well as assess filling pressures   AKI on CKD stage IIIa  - Serum creatinine improved 1.65>>1.49>> 1.51>> 1.4 today (peaked at 2.12 on 02/20/2024)  - will need home maintenance diuretic at DC   Paroxysmal atrial flutter - was maintaining NSR until this am she went into atrial flutter with RVR - Eliquis  on hold for cath -  Continue IV heparin  drip per pharmacy - increase Lopressor  to 50mg  BID  Mild microcytic anemia - FOBT negative, no bleeding reported  Super morbid Morbid obesity - consider sleep study as OP   QT prolongation - 503 ms; psych meds as per psych team  I spent 30 minutes caring for this patient today face to face, ordering and reviewing labs, reviewing records from 02/19/2024 reviewing TEE, seeing the patient, documenting in the record    For questions or updates, please contact [facility] Please consult [email or web address] for contact info under Cardiology/STEMI.  Wilbert Bihari, MD [facility] 02/23/2024

## 2024-02-25 NOTE — Progress Notes (Signed)
 Patient complaining of chest pressure and asking for a dose of oxycodone .   Vitals are stable, EKG shows rate-controlled AF. Plan to check troponins and give a dose of oxycodone .

## 2024-02-25 NOTE — Progress Notes (Signed)
 PROGRESS NOTE Abigail Sharp  FMW:[id number] DOB: Oct 25, 1965 DOA: 02/17/2024 PCP: Pcp, No  Brief Narrative/Hospital Course: Abigail Sharp is a 58 y.o. female with PMH of hypertension, HFpEF, anxiety presenting with chest pain and shortness of breath.symoptoms going on for months aand worse with activity, some orthopnea. She gets pain all over on walking. Never had sleep apnea. She also c/o chest pain when she wakes up as she cannont breather and feels lie  heart coming out of chest  reports having cath in 2016 and no stent needed. She also c/o nasal stuffiness, cough for sometime. She has lots of complaints feeling hot. [name]  has been having nausea. Patient otherwise denies any nausea, vomiting,fever, chills, headache, focal weakness, numbness tingling, speech difficulties.  She endorses weight gain was 277 lb on 8/1 and repeat pending. In the ED:BP poorly controlled 160/110, saturating 90 to 97% on room air, heart rate 102 afebrile somewhat tachypneic in 20s EKG without STEMI.  Hemoglobin 11.2, FOBT negative, creatinine 1.6 (stable), troponin 26 > 29, BNP 856.  UA STABLE Chest x-ray showing vascular congestion and possible tiny pleural effusions.  Patient was given aspirin , IV Lasix  40 mg, and morphine  and admission was requested for CHF exacerbation Cardiology was consulted placed on heparin  for anticoagulation for A-fib, echo showed reduced EF 40-45% with moderate to severe MR. Patient was diuresed, also needing IV fluid support Patient has been transitioned on oral DOAC and tolerating well.  Underwent TEE 9/22 showed EF 35-40% moderately reduced RV systolic function, severe MR, EF 35-40%.  Subjective: Seen and examined Resting comfortably no chest pain currently, waiting for cardiac cath today She had chest pain last night troponin was done and patient received oxycodone , patient went to A-fib with RVR received metoprolol  x 1 and started on heparin  drip  Assessment and plan:  Acute on chronic  HFrEF w. EF 35-40% Severe MR, PFO/moderate RV dysfunction small pericardial effusion NICM hx Chest pain /w elevated troponin likely demand ischemia Orthostatic hypotension: CHF exacerbation in the setting of noncompliance and with severe MR  weight when in August was 277 pounds on admission to 284 lb> improved to 124.3 kg with diuresis BP orthostatic got 500 cc bolus 9/19 and Lasix  on hold TTE :EF 40-45%, GHNK TEE>Severe MR EF 35-40%,PFO/moderate RV dysfunction small pericardial effusion Planning for cardiac catheterization 9/24with goal of ultimate valve replacement versus mitral valve clip Cont to monitor daily I/O,weight, electrolytes  w/ salt/fluid restricted diet and monitor in tele  Net IO Since Admission: -13,124.22 mL [02/25/24 1123]  Filed Weights   02/23/24 0528 02/24/24 0556 02/24/24 1510  Weight: 125 kg 124.3 kg 125.5 kg    Recent Labs  Lab 02/20/24 0343 02/21/24 0348 02/22/24 0244 02/23/24 0403 02/24/24 0313 02/25/24 0729  BUN 41* 36* 37* 38* 38* 37*  CREATININE 2.12* 1.92* 1.65* 1.49* 1.51* 1.40*  K 4.0 4.4 4.7 4.5 4.4 4.6  MG 1.8 1.8  --   --   --   --    AKI on CKD 3B vs CKD 4: Last creatinine in August was 2.1 although previously up to 1.2.  Suspect her creatinine is around baseline now in 1.5 Monitor avoid nephrotoxic medications Recent Labs    01/02/24 1504 01/02/24 2353 02/17/24 1610 02/19/24 0342 02/20/24 0343 02/21/24 0348 02/22/24 0244 02/23/24 0403 02/24/24 0313 02/25/24 0729  BUN 39*  --  29* 30* 41* 36* 37* 38* 38* 37*  CREATININE 2.18*  --  1.61* 1.83* 2.12* 1.92* 1.65* 1.49* 1.51* 1.40*  CO2 21*  --  21* 26 27 27 26 24 26 22   K 4.8   < > 4.5 4.4 4.0 4.4 4.7 4.5 4.4 4.6   < > = values in this interval not displayed.    New onset Atrial flutter: Eliquis  on hold for cath, continue heparin  drip. Off Coreg  due to hypotension.TSH normal  Prolonged Qtc: QTc 9/20 477 previously 503 on 9/19.  Celexa  dose has been decreased.   Monitor  Microcytic anemia Hemoglobin stable   Anxiety/depression/OCD: Mood stable.non compliant with psych meds at home seen by psychiatry> reintroduced some meds  Continue Abilify , BuSpar  Lamictal  and Zoloft  slightly lower dose due to prolonged QTc  Morbid Obesity w/ Body mass index is 47.49 kg/m.: Will benefit with PCP follow-up, weight loss,healthy lifestyle and outpatient sleep eval if not done.  Mobility: PT Orders:  PT Follow up Rec: Home Health Pt9/22/2025 1538   DVT prophylaxis:  Code Status:   Code Status: Full Code Family Communication: plan of care discussed with patient at bedside. Patient status is: Remains hospitalized because of severity of illness Level of care: Telemetry Cardiac   Dispo: The patient is from: home            Anticipated disposition: Home with home health once cleared from cardiology   Objective: Vitals last 24 hrs: Vitals:   02/25/24 0000 02/25/24 0300 02/25/24 0422 02/25/24 0826  BP:  132/75 127/62 116/80  Pulse:  94 94 (!) 52  Resp: 20 18 15  (!) 22  Temp:  98 F (36.7 C) 97.9 F (36.6 C) 98 F (36.7 C)  TempSrc:  Oral Oral Oral  SpO2:  98% 98% 96%  Weight:      Height:        Physical Examination: General exam: AAOX3, obese HEENT:Oral mucosa moist, Ear/Nose WNL grossly Respiratory system: B/l Clear BS, no use of accessory muscle Cardiovascular system: S1 & S2 +, No JVD. Gastrointestinal system: Abdomen soft,NT,ND, BS+ Nervous System: Alert, awake, moving all extremities,and following commands. Extremities: extremities warm, leg edema mild Skin: No rashes,no icterus. MSK: Normal muscle bulk,tone, power   Medications reviewed:  Scheduled Meds:  ARIPiprazole   5 mg Oral Daily   busPIRone   10 mg Oral BID   fluticasone   2 spray Each Nare Daily   free water   250 mL Oral Once   lamoTRIgine   25 mg Oral BID   metoprolol  tartrate  25 mg Oral Once   metoprolol  tartrate  50 mg Oral BID   sertraline   50 mg Oral Daily   sodium  chloride flush  3 mL Intravenous Q12H   Continuous Infusions:  sodium chloride  10 mL/hr at 02/25/24 0906   heparin  1,550 Units/hr (02/25/24 0859)    Diet: Diet Order             Diet NPO time specified Except for: Sips with Meds  Diet effective now                    Data Reviewed: I have personally reviewed following labs and imaging studies ( see epic result tab) CBC: Recent Labs  Lab 02/20/24 0343 02/21/24 0348 02/23/24 0403 02/24/24 0313 02/25/24 0729  WBC 7.3 6.6 5.6 5.1 6.0  HGB 11.5* 11.2* 11.7* 11.8* 12.8  HCT 36.2 36.3 38.1 37.9 41.2  MCV 78.0* 79.1* 79.2* 79.5* 79.1*  PLT 240 261 221 209 222   CMP: Recent Labs  Lab 02/20/24 0343 02/21/24 0348 02/22/24 0244 02/23/24 0403 02/24/24 0313 02/25/24 0729  NA 139 139 138 135 141  137  K 4.0 4.4 4.7 4.5 4.4 4.6  CL 99 101 103 102 104 104  CO2 27 27 26 24 26 22   GLUCOSE 98 75 87 85 83 88  BUN 41* 36* 37* 38* 38* 37*  CREATININE 2.12* 1.92* 1.65* 1.49* 1.51* 1.40*  CALCIUM  8.4* 8.9 8.9 8.6* 8.9 8.9  MG 1.8 1.8  --   --   --   --    GFR: Estimated Creatinine Clearance: 58.1 mL/min (A) (by C-G formula based on SCr of 1.4 mg/dL (H)). Recent Labs  Lab 02/20/24 0343 02/21/24 0348  AST 17 16  ALT 16 15  ALKPHOS 67 60  BILITOT 0.7 0.5  PROT 6.0* 6.0*  ALBUMIN 3.1* 3.0*   No results for input(s): LIPASE, AMYLASE in the last 168 hours. No results for input(s): AMMONIA in the last 168 hours. Coagulation Profile: No results for input(s): INR, PROTIME in the last 168 hours. Unresulted Labs (From admission, onward)     Start     Ordered   02/26/24 0500  APTT  Daily,   R     Question:  Specimen collection method  Answer:  Lab=Lab collect   02/25/24 0902   02/26/24 0500  Heparin  level (unfractionated)  Daily,   R     Question:  Specimen collection method  Answer:  Lab=Lab collect   02/25/24 0902   02/25/24 1730  APTT  Once-Timed,   TIMED       Question:  Specimen collection method  Answer:  Lab=Lab  collect   02/25/24 0859   02/25/24 0500  Basic metabolic panel with GFR  Daily,   R     Question:  Specimen collection method  Answer:  Lab=Lab collect   02/24/24 1028   02/25/24 0500  CBC  Daily,   R     Question:  Specimen collection method  Answer:  Lab=Lab collect   02/24/24 1028   02/18/24 1442  Draw extra clot tube  (Creatinine Clearance, urine, 24-hour panel)  Once,   R        02/18/24 1441           Antimicrobials/Microbiology: Anti-infectives (From admission, onward)    None         Component Value Date/Time   SDES URINE, CLEAN CATCH 01/03/2024 0025   SPECREQUEST  01/03/2024 0025    NONE Performed at [facility], [address], [place], [place] [address]    CULT 70,000 COLONIES/mL ESCHERICHIA COLI (A) 01/03/2024 0025   REPTSTATUS 01/05/2024 FINAL 01/03/2024 0025   Procedures: Procedure(s) (LRB): TRANSESOPHAGEAL ECHOCARDIOGRAM (N/A) Mennie LAMY, MD [facility] 02/25/2024, 11:23 AM

## 2024-02-25 NOTE — Progress Notes (Signed)
 PHARMACY - ANTICOAGULATION CONSULT NOTE  Pharmacy Consult for heparin  Indication: atrial fibrillation  Allergies  Allergen Reactions   Sulfa Antibiotics Other (See Comments)    Childhood allergy with unknown reaction    Patient Measurements: Height: 5' 4 (162.6 cm) Weight: 125.5 kg (276 lb 10.8 oz) IBW/kg (Calculated) : 54.7 HEPARIN  DW (KG): 85.5  Vital Signs: Temp: 98 F (36.7 C) (09/24 0826) Temp Source: Oral (09/24 0826) BP: 116/80 (09/24 0826) Pulse Rate: 52 (09/24 0826)  Labs: Recent Labs    02/23/24 0403 02/24/24 0313 02/25/24 0331 02/25/24 0729  HGB 11.7* 11.8*  --  12.8  HCT 38.1 37.9  --  41.2  PLT 221 209  --  222  APTT  --   --   --  57*  HEPARINUNFRC  --   --   --  0.91*  CREATININE 1.49* 1.51*  --  1.40*  TROPONINIHS  --   --  18* 20*    Estimated Creatinine Clearance: 58.1 mL/min (A) (by C-G formula based on SCr of 1.4 mg/dL (H)).   Medical History: Past Medical History:  Diagnosis Date   Anxiety    Chronic systolic heart failure (HCC)    Depression    HTN (hypertension)    Microcytic anemia    NICM (nonischemic cardiomyopathy) (HCC) 11/2014   EF is 25% improved to 55% in 2015.   Pre-diabetes       Assessment: 6 yoF admitted with CHF and AF started on apixaban . Pt now will need L/RHC so apixaban  transitioned to heparin . LD of apixaban  this am ~0800.  Herperin level falsely elevated due to apixaban  dosing, aPTT 57 which is subtherapeutic. H/H and Plts stable.  Goal of Therapy:  Heparin  level 0.3-0.7 units/ml aPTT 66-102 seconds Monitor platelets by anticoagulation protocol: Yes   Plan:  Increase Heparin  1550 units/h no bolus. Check aPTT in 8 hours and aPTT and heparin  level daily.  Donny Alert, PharmD, [facility] Clinical Pharmacist Please see AMION for all Pharmacists' Contact Phone Numbers 02/25/2024, 8:58 AM

## 2024-02-25 NOTE — Interval H&P Note (Signed)
 History and Physical Interval Note:  02/25/2024 2:51 PM  Abigail Sharp  has presented today for surgery, with the diagnosis of MR.  The various methods of treatment have been discussed with the patient and family. After consideration of risks, benefits and other options for treatment, the patient has consented to  Procedure(s): RIGHT/LEFT HEART CATH AND CORONARY ANGIOGRAPHY (N/A) as a surgical intervention.  The patient's history has been reviewed, patient examined, no change in status, stable for surgery.  I have reviewed the patient's chart and labs.  Questions were answered to the patient's satisfaction.    The plan is for diagnostic imaging as part of preoperative assessment.  Alm Clay

## 2024-02-25 NOTE — Progress Notes (Deleted)
 Mobility Specialist Progress Note:    02/25/24 1407  Mobility  Activity Ambulated with assistance (In hallway)  Level of Assistance Contact guard assist, steadying assist  Assistive Device Front wheel walker  Distance Ambulated (ft) 300 ft  Activity Response Tolerated well  Mobility Referral Yes  Mobility visit 1 Mobility  Mobility Specialist Start Time (ACUTE ONLY) 1214  Mobility Specialist Stop Time (ACUTE ONLY) 1225  Mobility Specialist Time Calculation (min) (ACUTE ONLY) 11 min   Received pt in chair and agreeable to mobility. Pt required MinG d/t safety. No c/o. Returned to room without fault. Left pt in chair with alarm on. Personal belongings and call light within reach. All needs met.  Lavanda Pollack Mobility Specialist  Please contact via [employer] or  Rehab Office [phone]

## 2024-02-25 NOTE — Consult Note (Addendum)
 Advanced Heart Failure Team Consult Note   Primary Physician: Pcp, No Cardiologist:  Madonna Large, DO  Reason for Consultation: acute systolic heart failure w/ low output  HPI:    Abigail Sharp is seen today for evaluation of acute systolic heart failure w/ low output at the request of Dr. Shlomo, Cardiology.   59 y/o female with a h/o NICM  in 2016, EF then 25%, cath showed normal coronaries. EF ultimately improved, up to 60-65% in 2022. Also w/ h/o HTN, morbid obesity, CKD III and anxiety.   Now admitted w/ acute CHF. Presented w/ worsening exertional dyspnea over the last several months. Initial 2D Echo showed EF 40-45%, RV normal, mod-severe MR, severe LAE, mod RAE. F/u TEE showed EF 35-40%, RV mod reduced, severe MR, + bubble study w/ small PFO. No LA thrombus.   Underwent R/LHC earlier today showing mild two-vessel disease involving the LAD and OM 2. Prox LAD to Mid LAD lesion 50% stenosed with 50% stenosed side branch in 1st Diag. Dist LAD lesion is 40% stenosed. Dist Cx lesion is 30% stenosed. 2nd Mrg lesion is 40% stenosed. RHC demonstrated elevated biventricular filling pressures and low cardiac output, RA 15, PA 52/28 (38), PCW 38, PA sat 52%, CO 3.82, CI 1.7, PAPi 1.6.  Of note, pt also went in to AFL w/ RVR this morning. Initial rates 150s. Now in the 90s.   AHF team consulted to assist w/ further management.   She is currently getting IV Lasix . On Lopressor  25 mg bid. Got dose this morning.    SCr 1.40 (1.8 on admit).  K 4.6  3L in UOP yesterday. 1.8L in UOP thus far today. SBPs 130s.    TEE 02/23/24 1. Left ventricular ejection fraction, by estimation, is 35 to 40%. The  left ventricle has moderately decreased function. The left ventricle  demonstrates global hypokinesis. The left ventricular internal cavity size  was moderately dilated.   2. Right ventricular systolic function is moderately reduced. The right  ventricular size is mildly enlarged.   3. Left  atrial size was moderately dilated. No left atrial/left atrial  appendage thrombus was detected.   4. Right atrial size was mildly dilated.   5. A small pericardial effusion is present.   6. The aortic valve is tricuspid. Aortic valve regurgitation is not  visualized. No aortic stenosis is present.   7. Evidence of atrial level shunting detected by color flow Doppler.  Agitated saline contrast bubble study was positive with shunting observed  within 3-6 cardiac cycles suggestive of interatrial shunt. There is a  small patent foramen ovale.   8. 3D performed of the mitral valve and demonstrates severe MR.   9. The mitral valve is abnormal. Severe mitral valve regurgitation. MR  appears functional. There are 2 central MR jets, with total 3D VCA  0.5cm^2; in addition, there is RLPV systolic flow reversal. This is  consistent with severe MR    [facility] 02/25/24 Mild two-vessel disease involving the LAD and OM 2  Prox LAD to Mid LAD lesion is 50% stenosed with 50% stenosed side branch in 1st Diag. Dist LAD lesion is 40% stenosed. Dist Cx lesion is 30% stenosed. 2nd Mrg lesion is 40% stenosed.  Moderate pulmonary pretension with mean PAP 38 mmHg and PCWP 38 mmHg and LVEDP of 29 mmHg. Prominent berry is noted, but not giant.   Pressures Phases Resting  Right     RA Mean  mmHg 15    RA A-Wave  mmHg 17    RA V-Wave  mmHg 17  Pulmonary     PA  mmHg 52/28 (38)    PCW Mean  mmHg 38.0    PCW A-Wave  mmHg 39.0    PCW V-Wave  mmHg 41.0    PAPi   1.6    Saturations Phases Resting    PA  % 52    Arterial  % 97   Fick Cardiac Output 3.82 L/min  Fick Cardiac Output Index 1.7 (L/min)/BSA      Home Medications Prior to Admission medications   Medication Sig Start Date End Date Taking? Authorizing Provider  ARIPiprazole  (ABILIFY ) 5 MG tablet Take 1 tablet (5 mg total) by mouth daily. Patient not taking: Reported on 02/18/2024 09/18/23   Carvin Arvella HERO, MD  aspirin  81 MG EC tablet Take 1  tablet (81 mg total) by mouth daily. Patient not taking: Reported on 02/18/2024 04/17/16   Marilyne Morrison T, MD  busPIRone  (BUSPAR ) 10 MG tablet Take 1 tablet (10 mg total) by mouth 2 (two) times daily. Patient not taking: Reported on 02/18/2024 07/17/23   Carvin Arvella HERO, MD  carvedilol  (COREG ) 6.25 MG tablet Take 1 tablet (6.25 mg total) by mouth 2 (two) times daily with a meal. Patient not taking: Reported on 02/18/2024 01/05/24   Tolia, Sunit, DO  cephALEXin  (KEFLEX ) 500 MG capsule Take 1 capsule (500 mg total) by mouth 3 (three) times daily. Patient not taking: Reported on 02/18/2024 01/03/24   Horton, Charmaine FALCON, MD  lamoTRIgine  (LAMICTAL ) 25 MG tablet Take 1 tablet (25 mg total) by mouth 2 (two) times daily. Patient not taking: Reported on 02/18/2024 07/17/23 02/17/25  Carvin Arvella HERO, MD  sertraline  (ZOLOFT ) 100 MG tablet Take 2 tablets (200 mg total) by mouth daily. Patient not taking: Reported on 02/18/2024 07/17/23 07/16/24  Carvin Arvella HERO, MD  spironolactone  (ALDACTONE ) 25 MG tablet Take 1 tablet (25 mg total) by mouth daily. Patient not taking: Reported on 02/18/2024 01/05/24   Michele Richardson, DO    Past Medical History: Past Medical History:  Diagnosis Date   Anxiety    Chronic systolic heart failure (HCC)    Depression    HTN (hypertension)    Microcytic anemia    NICM (nonischemic cardiomyopathy) (HCC) 11/2014   EF is 25% improved to 55% in 2015.   Pre-diabetes     Past Surgical History: Past Surgical History:  Procedure Laterality Date   CARDIAC CATHETERIZATION N/A 11/07/2014   Procedure: Right/Left Heart Cath and Coronary Angiography;  Surgeon: Dorn JINNY Lesches, MD;  Location[facility] INVASIVE CV LAB;  Service: Cardiovascular;  Laterality: N/A;   TONSILLECTOMY     TRANSESOPHAGEAL ECHOCARDIOGRAM (CATH LAB) N/A 02/23/2024   Procedure: TRANSESOPHAGEAL ECHOCARDIOGRAM;  Surgeon: Kate Lonni CROME, MD;  Location: [facility] INVASIVE CV LAB;  Service: Cardiovascular;  Laterality: N/A;   TUBAL  LIGATION      Family History: Family History  Problem Relation Age of Onset   Diabetes Mother    Hypertension Mother    Diabetes Father    Heart disease Father    Hypertension Father    Diabetes Sister    ADD / ADHD Son    Alcohol abuse Neg Hx    Anxiety disorder Neg Hx    Bipolar disorder Neg Hx    Depression Neg Hx    Drug abuse Neg Hx     Social History: Social History   Socioeconomic History   Marital status: Single    Spouse name:  Not on file   Number of children: Not on file   Years of education: Not on file   Highest education level: Not on file  Occupational History   Not on file  Tobacco Use   Smoking status: Never   Smokeless tobacco: Never  Vaping Use   Vaping status: Never Used  Substance and Sexual Activity   Alcohol use: No    Alcohol/week: 0.0 standard drinks of alcohol   Drug use: No   Sexual activity: Never  Other Topics Concern   Not on file  Social History Narrative   Social Hx:   Current living situation: Lives alone in apt in [place]: [place], [place] by parents.   Raised by parents. Parents let her do whatever she wanted to do while growing up. Both parents now deceased.    Siblings 1 brother and 2 sisters. Pt is number 3   Schooling- HS grad, faced a lot of bullied due to teen pregnancy at 48. Lived with parents while raising her oldest child but for the youngest she moved out temporarily. Her mom took over raising him at the age of 46 mo.    Employed- last job in 2002. 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            Social Drivers of Corporate investment banker Strain: Not on file  Food Insecurity: No Food Insecurity (02/18/2024)   Hunger Vital Sign    Worried About Running Out of Food in the Last Year: Never true    Ran Out of Food in the Last Year: Never true  Transportation Needs: No Transportation Needs (02/18/2024)   PRAPARE  - Administrator, Civil Service (Medical): No    Lack of Transportation (Non-Medical): No  Physical Activity: Not on file  Stress: Not on file  Social Connections: Not on file    Allergies:  Allergies  Allergen Reactions   Sulfa Antibiotics Other (See Comments)    Childhood allergy with unknown reaction    Objective:    Vital Signs:   Temp:  [97.9 F (36.6 C)-98.3 F (36.8 C)] 97.9 F (36.6 C) (09/24 1611) Pulse Rate:  [0-122] 86 (09/24 1611) Resp:  [11-34] 15 (09/24 1611) BP: (114-153)/(62-115) 134/97 (09/24 1611) SpO2:  [94 %-100 %] 97 % (09/24 1611) Last BM Date :  (02/24/24)  Weight change: Filed Weights   02/23/24 0528 02/24/24 0556 02/24/24 1510  Weight: 125 kg 124.3 kg 125.5 kg    Intake/Output:   Intake/Output Summary (Last 24 hours) at 02/25/2024 1648 Last data filed at 02/25/2024 1640 Gross per 24 hour  Intake 540.1 ml  Output 3150 ml  Net -2609.9 ml      Physical Exam    General: obese, NAD. No breathing difficulty at rest  HEENT: normal Neck: JVP 14 cm  Cor: Irregularly rate and rhythm, 3/6 MR murmur  Lungs: decreased BS at the bases bilaterally  Abdomen: obese, soft, NT  Extremities: no cyanosis, clubbing, rash, edema Neuro: alert & orientedx3, cranial nerves grossly intact. moves all 4 extremities w/o difficulty. Affect pleasant   Telemetry   AFL 90s, personally reviewed   EKG    Admission EKG NSR 98 bpm   Labs   Basic Metabolic Panel: Recent Labs  Lab 02/20/24 0343 02/21/24 0348 02/22/24 0244 02/23/24 0403 02/24/24 0313 02/25/24  0729  NA 139 139 138 135 141 137  K 4.0 4.4 4.7 4.5 4.4 4.6  CL 99 101 103 102 104 104  CO2 27 27 26 24 26 22   GLUCOSE 98 75 87 85 83 88  BUN 41* 36* 37* 38* 38* 37*  CREATININE 2.12* 1.92* 1.65* 1.49* 1.51* 1.40*  CALCIUM  8.4* 8.9 8.9 8.6* 8.9 8.9  MG 1.8 1.8  --   --   --   --     Liver Function Tests: Recent Labs  Lab 02/20/24 0343 02/21/24 0348  AST 17 16  ALT 16 15   ALKPHOS 67 60  BILITOT 0.7 0.5  PROT 6.0* 6.0*  ALBUMIN 3.1* 3.0*   No results for input(s): LIPASE, AMYLASE in the last 168 hours. No results for input(s): AMMONIA in the last 168 hours.  CBC: Recent Labs  Lab 02/20/24 0343 02/21/24 0348 02/23/24 0403 02/24/24 0313 02/25/24 0729  WBC 7.3 6.6 5.6 5.1 6.0  HGB 11.5* 11.2* 11.7* 11.8* 12.8  HCT 36.2 36.3 38.1 37.9 41.2  MCV 78.0* 79.1* 79.2* 79.5* 79.1*  PLT 240 261 221 209 222    Cardiac Enzymes: No results for input(s): CKTOTAL, CKMB, CKMBINDEX, TROPONINI in the last 168 hours.  BNP: BNP (last 3 results) Recent Labs    01/02/24 1504 02/17/24 1610  BNP 119.1* 856.6*    ProBNP (last 3 results) No results for input(s): PROBNP in the last 8760 hours.   CBG: No results for input(s): GLUCAP in the last 168 hours.  Coagulation Studies: No results for input(s): LABPROT, INR in the last 72 hours.   Imaging   CARDIAC CATHETERIZATION Result Date: 02/25/2024 Table formatting from the original result was not included. Images from the original result were not included. Dominance: Right  Mild two-vessel disease involving the LAD and OM 2  Prox LAD to Mid LAD lesion is 50% stenosed with 50% stenosed side branch in 1st Diag. Dist LAD lesion is 40% stenosed. Dist Cx lesion is 30% stenosed. 2nd Mrg lesion is 40% stenosed. Moderate pulmonary pretension with mean PAP 38 mmHg and PCWP 38 mmHg and LVEDP of 29 mmHg. Prominent berry is noted, but not giant. RECOMMENDATIONS   With significant elevated PA pressures and wedge pressure, she was given 40 mg IV Lasix  in the Cath Lab and will be started on 40 mg twice daily starting the morning of 02/26/2024   With CAD, may benefit from aspirin  but since she will be on DOAC for atrial flutter/fibrillation, would simply treat with monotherapy for now.  She will be restarted on IV heparin  2 hours post TR band removal.  Decision on when to restart DOAC will be based on the next  Epson her preprocedural evaluation. Alm Clay, MD    Medications:     Current Medications:  ARIPiprazole   5 mg Oral Daily   busPIRone   10 mg Oral BID   fluticasone   2 spray Each Nare Daily   [START ON 02/26/2024] furosemide   40 mg Intravenous BID   lamoTRIgine   25 mg Oral BID   metoprolol  tartrate  50 mg Oral BID   sertraline   50 mg Oral Daily   sodium chloride  flush  3 mL Intravenous Q12H   sodium chloride  flush  3 mL Intravenous Q12H    Infusions:  sodium chloride         Patient Profile   58 y/o female w/ prior h/o NICM w/ improved EF, hypertension, obesity and CKD III, admitted w/ acute CHF w/ low output.  Found to have severe MR on w/u. Hospital course further c/b new AFL.   Assessment/Plan   1. Acute Systolic Heart Failure w/ Low Output - 2D Echo this admit EF 40-45%. EF 35-40% on TEE w/ RV mod reduced - NICM. Has CAD but not enough to explain CM  - RHC w/ elevated Biv filling pressures and low CO in setting of severe MR (RA 15, PA 52/28 (38), PCW 38, PA sat 52%, CO 3.82, CI 1.7, PAPi 1.6). IABP placement not ideal given tachyarrhythmia. She is diuresing ok w/o need for inotropic support - continue diuresis. Increase Lasix  to 80 mg bid - stop ? blocker w/ low output  - needs afterload reduction. Start Entresto  24-26 mg bid - start digoxin  0.125 mg daily  - if difficulty diuresing may need milrinone support, will monitor for now  2. Severe MR - suspect functional, severe LAE. Further c/b AFL  - needs HF + AFL optimization  - continue to push diuresis and afterload reduction per above - needs attempt at DCCV once better diuresed - will need reassessment of MV once HF/AFL optimized. If still severe, will need assessment for mTEER   4. AFL - current rates in the 90s - stopping ? blocker given low output HF - can use amio for rate control if needed  - needs attempt at DCCV once fully diuresed. TEE this admit showed no LA thrombus - start a/c w/ eliquis  once ok  to start post TR band removal  - LA diameter 4.6 cm. May not be good candidate for AFL ablation given obesity/BMI  - needs outpatient assessment for OSA   5. CAD - LHC today w/ mild nonobstructive CAD - medical management  - no ASA given need for a/c - add statin. Check LP in AM  6. AKI on CKD III - SCr 1.8 on admit, improved to 1.4 today  - low-out on RHC but appears to be diuresing well w/ IV Lasix   - monitor UOP and SCr closely   Length of Stay: [address], PA-C  02/25/2024, 4:48 PM    Advanced Heart Failure Team Pager [phone] (M-F; 7a - 5p)  Please contact [facility] for night-coverage after hours (4p -7a ) and weekends on [email or web address]  Patient seen with PA, I formulated the plan and agree with the above note.   Patient had a cardiomyopathy documented since 2016.  EF has fluctuated in the past as low as 25% to up to normal range.  She has had significant mitral regurgitation on echoes with low EF and minimal mitral regurgitation on echoes with normal EF.  She was admitted with worsening exertional dyspnea for several months.  She was also noted to be in atrial flutter, which is new.    Transthoracic echo showed EF around 40% with suspected severe MR, TEE recommended.  TEE showed EF 35-40%, moderate LV dilation, moderate RV dysfunction, and severe MR that appeared functional.  LHC/RHC today showed nonobstructive CAD and significantly elevated right and left heart filling pressures with low CI 1.7. SBP 130s-140s.   General: NAD Neck: JVP 14 cm, no thyromegaly or thyroid nodule.  Lungs: Clear to auscultation bilaterally with normal respiratory effort. CV: Nondisplaced PMI.  Heart irregular S1/S2, no S3/S4, 1/6 HSM apex.  No peripheral edema.  No carotid bruit.  Normal pedal pulses.  Abdomen: Soft, nontender, no hepatosplenomegaly, no distention.  Skin: Intact without lesions or rashes.  Neurologic: Alert and oriented x 3.  Psych: Normal affect. Extremities: No  clubbing or cyanosis.  HEENT: Normal.   1. Acute on chronic systolic CHF: Nonischemic cardiomyopathy.  EF has fluctuated from decreased to normal range since 2016.  TEE this admission showed EF 35-40%, moderate LV dilation, moderate RV dysfunction, and severe MR that appeared functional. LHC/RHC today showed nonobstructive CAD and significantly elevated right and left heart filling pressures with low CI 1.7. SBP 130s-140s.  - I will place PICC to follow co-ox and CVP.  - Rather than starting milrinone immediately due to low CI given atrial flutter which has been fast at times, I will work on afterload reduction with the severe MR.  Add Entresto  24/26 bid.  If she worsens, low threshold to start milrinone.  - Needs diuresis, start Lasix  80 mg IV bid.  - Start digoxin  0.125.  2. Atrial flutter: With RVR at times, now rate-controlled though not on any nodal blockers.  AFL was first noted this admission.  Mitral regurgitation will make this harder to manage.   - I am going to carefully start amiodarone .  She has a mildly elevated QTc at baseline in setting of psychiatric med use. However, I think it will be hard to keep her in rhythm without an antiarrhythmic.  Will start po amiodarone  400 mg bid.  - Continue Eliquis .  - After diuresis, would aim for TEE-guided DCCV.  3. Mitral regurgitation: This appears severe and functional on TEE.  Noted from prior echoes when EF is low, improves when EF is in normal range.  It may be that we can decrease the mitral regurgitation by diuresis, resumption of NSR, and afterload reduction/GDMT titration.  - Diuresis as above.  - Will try to get her back into NSR as above (atrial arrhythmias will potentiate MR).  - Starting Entresto  as above.  5. CAD: Nonobstructive on cath today.  - No ASA with Eliquis  use.  - Add statin.  6. CKD stage 3: Creatinine lower today at 1.4.  7. Depression/anxiety: See med list for psych meds.  QTc mildly prolonged at baseline, follow  closely.   Ezra Shuck 02/25/2024

## 2024-02-25 NOTE — H&P (View-Only) (Signed)
 Progress Note  Patient Name: Abigail Sharp Date of Encounter: 02/25/2024  Primary Cardiologist: Madonna Large, DO  Subjective   Had TEE this admit showing EF 35 to 40% with global HK, moderate RV dysfunction, small pericardial effusion, PFO and severe MR by 3D  Complained of  chest pain this morning  Went into atrial flutter with RVR this am.   Inpatient Medications    Scheduled Meds:  ARIPiprazole   5 mg Oral Daily   busPIRone   10 mg Oral BID   fluticasone   2 spray Each Nare Daily   free water   250 mL Oral Once   lamoTRIgine   25 mg Oral BID   metoprolol  tartrate  25 mg Oral BID   sertraline   50 mg Oral Daily   sodium chloride  flush  3 mL Intravenous Q12H   Continuous Infusions:  sodium chloride  10 mL/hr at 02/25/24 0906   heparin  1,550 Units/hr (02/25/24 0859)     PRN Meds: acetaminophen  **OR** acetaminophen , alum & mag hydroxide-simeth, hydrALAZINE , sodium chloride    Vital Signs    Vitals:   02/25/24 0000 02/25/24 0300 02/25/24 0422 02/25/24 0826  BP:  132/75 127/62 116/80  Pulse:  94 94 (!) 52  Resp: 20 18 15  (!) 22  Temp:  98 F (36.7 C) 97.9 F (36.6 C) 98 F (36.7 C)  TempSrc:  Oral Oral Oral  SpO2:  98% 98% 96%  Weight:      Height:        Intake/Output Summary (Last 24 hours) at 02/25/2024 0932 Last data filed at 02/25/2024 0900 Gross per 24 hour  Intake 290.14 ml  Output 3150 ml  Net -2859.86 ml      02/24/2024    3:10 PM 02/24/2024    5:56 AM 02/23/2024    5:28 AM  Last 3 Weights  Weight (lbs) 276 lb 10.8 oz 274 lb 0.5 oz 275 lb 9.2 oz  Weight (kg) 125.5 kg 124.3 kg 125 kg     Telemetry    Normal sinus rhythm- Personally Reviewed  Physical Exam   GEN: Well nourished, well developed in no acute distress HEENT: Normal NECK: No JVD; No carotid bruits LYMPHATICS: No lymphadenopathy CARDIAC:irregularly irregular and tachy, no  rubs, gallops Harsh 2/6 systolic murmur at the left lower sternal border to the apex RESPIRATORY:  Clear to  auscultation without rales, wheezing or rhonchi  ABDOMEN: Soft, non-tender, non-distended MUSCULOSKELETAL:  No edema; No deformity  SKIN: Warm and dry NEUROLOGIC:  Alert and oriented x 3 PSYCHIATRIC:  Normal affect  Labs    High Sensitivity Troponin:   Recent Labs  Lab 02/17/24 1755 02/18/24 1710 02/18/24 2007 02/25/24 0331 02/25/24 0729  TROPONINIHS 29* 21* 19* 18* 20*       Chemistry Recent Labs  Lab 02/20/24 0343 02/21/24 0348 02/22/24 0244 02/23/24 0403 02/24/24 0313 02/25/24 0729  NA 139 139   < > 135 141 137  K 4.0 4.4   < > 4.5 4.4 4.6  CL 99 101   < > 102 104 104  CO2 27 27   < > 24 26 22   GLUCOSE 98 75   < > 85 83 88  BUN 41* 36*   < > 38* 38* 37*  CREATININE 2.12* 1.92*   < > 1.49* 1.51* 1.40*  CALCIUM  8.4* 8.9   < > 8.6* 8.9 8.9  PROT 6.0* 6.0*  --   --   --   --   ALBUMIN 3.1* 3.0*  --   --   --   --  AST 17 16  --   --   --   --   ALT 16 15  --   --   --   --   ALKPHOS 67 60  --   --   --   --   BILITOT 0.7 0.5  --   --   --   --   GFRNONAA 27* 30*   < > 41* 40* 44*  ANIONGAP 13 11   < > 9 11 11    < > = values in this interval not displayed.     Hematology Recent Labs  Lab 02/23/24 0403 02/24/24 0313 02/25/24 0729  WBC 5.6 5.1 6.0  RBC 4.81 4.77 5.21*  HGB 11.7* 11.8* 12.8  HCT 38.1 37.9 41.2  MCV 79.2* 79.5* 79.1*  MCH 24.3* 24.7* 24.6*  MCHC 30.7 31.1 31.1  RDW 17.8* 18.2* 18.1*  PLT 221 209 222    BNP No results for input(s): BNP, PROBNP in the last 168 hours.     Radiology    ECHO TEE Result Date: 02/23/2024    TRANSESOPHOGEAL ECHO REPORT   Patient Name:   ESPARANZA Sharp Date of Exam: 02/23/2024 Medical Rec #:  [id number]     Height:       64.0 in Accession #:    [id number]    Weight:       275.6 lb Date of Birth:  May 21, 1966      BSA:          2.242 m Patient Age:    58 years      BP:           153/95 mmHg Patient Gender: F             HR:           75 bpm. Exam Location:  Inpatient Procedure: Transesophageal Echo, 3D Echo,  Cardiac Doppler, Color Doppler and            Saline Contrast Bubble Study (Both Spectral and Color Flow Doppler            were utilized during procedure). Indications:     I34.0 Nonrheumatic mitral (valve) insufficiency  History:         Patient has prior history of Echocardiogram examinations, most                  recent 02/19/2024.  Sonographer:     Ellouise Mose RDCS Referring Phys:  [id number] LEONTINE SAILOR LOCKWOOD Diagnosing Phys: Lonni Nanas MD PROCEDURE: After discussion of the risks and benefits of a TEE, an informed consent was obtained from the patient. The transesophogeal probe was passed without difficulty through the esophogus of the patient. Imaged were obtained with the patient in a left lateral decubitus position. Sedation performed by different physician. The patient was monitored while under deep sedation. Anesthestetic sedation was provided intravenously by Anesthesiology: 450mg  of Propofol . The patient's vital signs; including heart rate, blood pressure, and oxygen saturation; remained stable throughout the procedure. The patient developed no complications during the procedure.  IMPRESSIONS  1. Left ventricular ejection fraction, by estimation, is 35 to 40%. The left ventricle has moderately decreased function. The left ventricle demonstrates global hypokinesis. The left ventricular internal cavity size was moderately dilated.  2. Right ventricular systolic function is moderately reduced. The right ventricular size is mildly enlarged.  3. Left atrial size was moderately dilated. No left atrial/left atrial appendage thrombus was detected.  4. Right atrial size was mildly dilated.  5. A small pericardial effusion is present.  6. The aortic valve is tricuspid. Aortic valve regurgitation is not visualized. No aortic stenosis is present.  7. Evidence of atrial level shunting detected by color flow Doppler. Agitated saline contrast bubble study was positive with shunting observed within 3-6 cardiac  cycles suggestive of interatrial shunt. There is a small patent foramen ovale.  8. 3D performed of the mitral valve and demonstrates severe MR.  9. The mitral valve is abnormal. Severe mitral valve regurgitation. MR appears functional. There are 2 central MR jets, with total 3D VCA 0.5cm^2; in addition, there is RLPV systolic flow reversal. This is consistent with severe MR FINDINGS  Left Ventricle: Left ventricular ejection fraction, by estimation, is 35 to 40%. The left ventricle has moderately decreased function. The left ventricle demonstrates global hypokinesis. The left ventricular internal cavity size was moderately dilated. Right Ventricle: The right ventricular size is mildly enlarged. No increase in right ventricular wall thickness. Right ventricular systolic function is moderately reduced. Left Atrium: Left atrial size was moderately dilated. No left atrial/left atrial appendage thrombus was detected. Right Atrium: Right atrial size was mildly dilated. Pericardium: A small pericardial effusion is present. Mitral Valve: The mitral valve is abnormal. Severe mitral valve regurgitation. Tricuspid Valve: The tricuspid valve is normal in structure. Tricuspid valve regurgitation is mild . No evidence of tricuspid stenosis. Aortic Valve: The aortic valve is tricuspid. Aortic valve regurgitation is not visualized. No aortic stenosis is present. Pulmonic Valve: The pulmonic valve was grossly normal. Pulmonic valve regurgitation is mild. No evidence of pulmonic stenosis. Aorta: The aortic root and ascending aorta are structurally normal, with no evidence of dilitation. IAS/Shunts: Evidence of atrial level shunting detected by color flow Doppler. Agitated saline contrast was given intravenously to evaluate for intracardiac shunting. Agitated saline contrast bubble study was positive with shunting observed within 3-6 cardiac cycles suggestive of interatrial shunt. A small patent foramen ovale is detected. Additional  Comments: 3D was performed not requiring image post processing on an independent workstation and was abnormal. Spectral Doppler performed. MV E velocity: 94.00 cm/s Lonni Nanas MD Electronically signed by Lonni Nanas MD Signature Date/Time: 02/23/2024/1:59:49 PM    Final    EP STUDY Result Date: 02/23/2024 See surgical note for result.    Cardiac Studies   2d echo 02/19/24    1. Left ventricular ejection fraction, by estimation, is 40 to 45%. The  left ventricle has mildly decreased function. The left ventricle  demonstrates global hypokinesis. There is mild left ventricular  hypertrophy. Elevated left ventricular end-diastolic   pressure.   2. Right ventricular systolic function is normal. The right ventricular  size is normal. There is mildly elevated pulmonary artery systolic  pressure.   3. Left atrial size was severely dilated.   4. Right atrial size was moderately dilated.   5. Consider TEE to better evaluate mitral regurgitation. The mitral valve  is normal in structure. Moderate to severe mitral valve regurgitation. No  evidence of mitral stenosis.   6. The aortic valve is tricuspid. Aortic valve regurgitation is not  visualized. No aortic stenosis is present.   7. The inferior vena cava is normal in size with greater than 50%  respiratory variability, suggesting right atrial pressure of 3 mmHg.   Patient Profile     58 y.o. female with  anxiety, agoraphobia, hypertension, nonischemic cardiomyopathy, chronic HFimpEF, morbid obesity, CKD ?stage IV. Dx with NICM EF 25% in 2016 with normal coronaries. EF in 2022 was 60-65%.  Admitted with worsening CP, SOB, abdominal pain/swelling, nausea, vomiting, diaphoresis, orthopnea. Admitted with a/c CHF and also noted to have atrial flutter. Recent Cr 01/2024 was 2.1 (last 1.21 in 2021), admitted here at 1.61, also mildly anemic with neg FOBT.  Assessment & Plan    Acute on Chronic HF - Admitted with volume overload and  BNP of 856 and chest x-ray with cardiomegaly with vascular congestion; - Diuresed with IV Lasix  diuretics now on hold for AKI - Serum creatinine improved from 1.65->>1.49->> 1.51->> 1.4 today - O2 saturation is 96% on room air - 2D echo 02/19/2024 with EF 40 to 45% with global HK severe RAE, moderate to severe MR - TEE 9/22 showed severe MR with pulmonary vein flow reversal as well as EF 35 to 40% and RV dysfunction - Discussed the results with the patient and recommended that we proceed with a right and left heart cath with the goal of ultimate valve replacement versus mitral valve clip -Patient is on for right and left heart cath later this afternoon after holding 2 doses of Eliquis  -Informed Consent   Shared Decision Making/Informed Consent The risks [stroke (1 in 1000), death (1 in 1000), kidney failure [usually temporary] (1 in 500), bleeding (1 in 200), allergic reaction [possibly serious] (1 in 200)], benefits (diagnostic support and management of coronary artery disease) and alternatives of a cardiac catheterization were discussed in detail with Ms. Guevarra and she is willing to proceed. - Would benefit from addition of ARB/ARNI/MRA since renal function is improving - Start losartan  25 mg daily after cath and follow renal function closely - Ultimately will place on low-dose carvedilol  and asked SGLT2 as well if renal function allows  Atypical CP (worse with palpitation) Elevated troponin - low/flat troponin not felt to reflect ACS (26>> 29>> 21>> 19) - prior cath 2016 with normal coronaries - Plan for right and left heart cath today to define coronary anatomy as well as assess filling pressures   AKI on CKD stage IIIa  - Serum creatinine improved 1.65>>1.49>> 1.51>> 1.4 today (peaked at 2.12 on 02/20/2024)  - will need home maintenance diuretic at DC   Paroxysmal atrial flutter - was maintaining NSR until this am she went into atrial flutter with RVR - Eliquis  on hold for cath -  Continue IV heparin  drip per pharmacy - increase Lopressor  to 50mg  BID  Mild microcytic anemia - FOBT negative, no bleeding reported  Super morbid Morbid obesity - consider sleep study as OP   QT prolongation - 503 ms; psych meds as per psych team  I spent 30 minutes caring for this patient today face to face, ordering and reviewing labs, reviewing records from 02/19/2024 reviewing TEE, seeing the patient, documenting in the record    For questions or updates, please contact [facility] Please consult [email or web address] for contact info under Cardiology/STEMI.  Wilbert Bihari, MD [facility] 02/23/2024

## 2024-02-25 NOTE — Progress Notes (Signed)
 Arrived to discuss bedside PICC placement including risks, benefits, and alternatives. Patient requested speaking to MD before agreeing to procedure. Gail RN notified.

## 2024-02-25 NOTE — Progress Notes (Signed)
 PT Cancellation Note  Patient Details Name: [name] MRN: [id number] DOB: 10-30-1965   Cancelled Treatment:    Reason Eval/Treat Not Completed: (P) Patient at procedure or test/unavailable (pt at cath lab) Will continue efforts next date per PT plan of care as schedule permits.   Connell HERO Sakeena Teall 02/25/2024, 2:35 PM

## 2024-02-25 NOTE — Progress Notes (Signed)
 PHARMACY - ANTICOAGULATION CONSULT NOTE  Pharmacy Consult for heparin  Indication: atrial fibrillation  Allergies  Allergen Reactions   Sulfa Antibiotics Other (See Comments)    Childhood allergy with unknown reaction    Patient Measurements: Height: 5' 4 (162.6 cm) Weight: 125.5 kg (276 lb 10.8 oz) IBW/kg (Calculated) : 54.7 HEPARIN  DW (KG): 85.5  Vital Signs: Temp: 97.9 F (36.6 C) (09/24 1611) Temp Source: Oral (09/24 1611) BP: 134/97 (09/24 1611) Pulse Rate: 0 (09/24 1557)  Labs: Recent Labs    02/23/24 0403 02/24/24 0313 02/25/24 0331 02/25/24 0729  HGB 11.7* 11.8*  --  12.8  HCT 38.1 37.9  --  41.2  PLT 221 209  --  222  APTT  --   --   --  57*  HEPARINUNFRC  --   --   --  0.91*  CREATININE 1.49* 1.51*  --  1.40*  TROPONINIHS  --   --  18* 20*    Estimated Creatinine Clearance: 58.1 mL/min (A) (by C-G formula based on SCr of 1.4 mg/dL (H)).   Medical History: Past Medical History:  Diagnosis Date   Anxiety    Chronic systolic heart failure (HCC)    Depression    HTN (hypertension)    Microcytic anemia    NICM (nonischemic cardiomyopathy) (HCC) 11/2014   EF is 25% improved to 55% in 2015.   Pre-diabetes       Assessment: 67 yoF admitted with CHF and AF started on apixaban . Pt now will need L/RHC so apixaban  transitioned to heparin . LD of apixaban  this am ~0800.  Herperin level falsely elevated due to apixaban  dosing, aPTT 57 which is subtherapeutic. H/H and Plts stable.  PM Update: now s/p cath, heparin  to resume 2hr post TR band removal. Will plan to resume at previous rate of 1550 units/hr (aPTT was subtherapeuitc on 1300 units/hr this morning) .  Goal of Therapy:  Heparin  level 0.3-0.7 units/ml aPTT 66-102 seconds Monitor platelets by anticoagulation protocol: Yes   Plan:  2hr after TR band removed, resume Heparin  1550 units/h no bolus. Check aPTT in 8 hours and aPTT and heparin  level daily. F/u for apixaban  resumption.  [name], PharmD, BCPS Clinical Pharmacist Please see AMION for all Pharmacists' Contact Phone Numbers 02/25/2024, 4:21 PM

## 2024-02-26 ENCOUNTER — Telehealth (HOSPITAL_COMMUNITY): Payer: Self-pay | Admitting: Pharmacy Technician

## 2024-02-26 ENCOUNTER — Inpatient Hospital Stay (HOSPITAL_COMMUNITY): Payer: MEDICAID

## 2024-02-26 ENCOUNTER — Other Ambulatory Visit (HOSPITAL_COMMUNITY): Payer: Self-pay

## 2024-02-26 DIAGNOSIS — I5031 Acute diastolic (congestive) heart failure: Secondary | ICD-10-CM | POA: Diagnosis not present

## 2024-02-26 LAB — CBC
HCT: 42.6 % (ref 36.0–46.0)
Hemoglobin: 13.5 g/dL (ref 12.0–15.0)
MCH: 24.7 pg — ABNORMAL LOW (ref 26.0–34.0)
MCHC: 31.7 g/dL (ref 30.0–36.0)
MCV: 77.9 fL — ABNORMAL LOW (ref 80.0–100.0)
Platelets: 214 K/uL (ref 150–400)
RBC: 5.47 MIL/uL — ABNORMAL HIGH (ref 3.87–5.11)
RDW: 18.4 % — ABNORMAL HIGH (ref 11.5–15.5)
WBC: 5.5 K/uL (ref 4.0–10.5)
nRBC: 0 % (ref 0.0–0.2)

## 2024-02-26 LAB — COOXEMETRY PANEL
Carboxyhemoglobin: 2.4 % — ABNORMAL HIGH (ref 0.5–1.5)
Methemoglobin: <0.7 % (ref 0.0–1.5)
O2 Saturation: 81.9 %
Total hemoglobin: 14.6 g/dL (ref 12.0–16.0)

## 2024-02-26 LAB — BASIC METABOLIC PANEL WITH GFR
Anion gap: 11 (ref 5–15)
BUN: 43 mg/dL — ABNORMAL HIGH (ref 6–20)
CO2: 24 mmol/L (ref 22–32)
Calcium: 9 mg/dL (ref 8.9–10.3)
Chloride: 101 mmol/L (ref 98–111)
Creatinine, Ser: 1.61 mg/dL — ABNORMAL HIGH (ref 0.44–1.00)
GFR, Estimated: 37 mL/min — ABNORMAL LOW (ref >60)
Glucose, Bld: 85 mg/dL (ref 70–99)
Potassium: 4.3 mmol/L (ref 3.5–5.1)
Sodium: 136 mmol/L (ref 135–145)

## 2024-02-26 LAB — GLUCOSE, CAPILLARY: Glucose-Capillary: 89 mg/dL (ref 70–99)

## 2024-02-26 MED ORDER — ACETAZOLAMIDE 250 MG PO TABS
500.0000 mg | ORAL_TABLET | Freq: Two times a day (BID) | ORAL | Status: DC
  Administered 2024-02-26 (×2): 500 mg via ORAL
  Filled 2024-02-26 (×3): qty 2

## 2024-02-26 MED ORDER — APIXABAN 5 MG PO TABS
5.0000 mg | ORAL_TABLET | Freq: Two times a day (BID) | ORAL | Status: DC
Start: 2024-02-26 — End: 2024-03-03
  Administered 2024-02-26 – 2024-03-03 (×13): 5 mg via ORAL
  Filled 2024-02-26 (×13): qty 1

## 2024-02-26 MED ORDER — ATORVASTATIN CALCIUM 40 MG PO TABS
40.0000 mg | ORAL_TABLET | Freq: Every day | ORAL | Status: DC
  Administered 2024-02-26 – 2024-03-03 (×7): 40 mg via ORAL
  Filled 2024-02-26 (×7): qty 1

## 2024-02-26 MED ORDER — SODIUM CHLORIDE 0.9% FLUSH
10.0000 mL | Freq: Two times a day (BID) | INTRAVENOUS | Status: DC
  Administered 2024-02-26: 10 mL

## 2024-02-26 MED ORDER — SODIUM CHLORIDE 0.9% FLUSH
10.0000 mL | INTRAVENOUS | Status: DC | PRN
  Administered 2024-02-26 (×2): 10 mL

## 2024-02-26 MED ORDER — CHLORHEXIDINE GLUCONATE CLOTH 2 % EX PADS
6.0000 | MEDICATED_PAD | Freq: Every day | CUTANEOUS | Status: DC
  Administered 2024-02-26 – 2024-03-02 (×6): 6 via TOPICAL

## 2024-02-26 MED ORDER — SODIUM CHLORIDE 0.9% FLUSH: 10.0000 mL | INTRAVENOUS | Status: DC | PRN

## 2024-02-26 MED ORDER — SODIUM CHLORIDE 0.9% FLUSH
10.0000 mL | Freq: Two times a day (BID) | INTRAVENOUS | Status: DC
  Administered 2024-02-26 – 2024-02-28 (×5): 10 mL
  Administered 2024-02-29: 20 mL
  Administered 2024-02-29 – 2024-03-01 (×2): 10 mL
  Administered 2024-03-01 – 2024-03-02 (×2): 20 mL
  Administered 2024-03-02 – 2024-03-03 (×2): 10 mL

## 2024-02-26 NOTE — Progress Notes (Signed)
 Mobility Specialist Progress Note:    02/26/24 1543  Mobility  Activity Stood at bedside;Dangled on edge of bed (x2 STS, Heel Raises, Leg Ext, Leg Lift x10)  Level of Assistance Contact guard assist, steadying assist  Assistive Device None  Range of Motion/Exercises Left leg;Right leg  Activity Response Tolerated fair;RN notified (BP really low)  Mobility Referral Yes  Mobility visit 1 Mobility  Mobility Specialist Start Time (ACUTE ONLY) 1543  Mobility Specialist Stop Time (ACUTE ONLY) 1553  Mobility Specialist Time Calculation (min) (ACUTE ONLY) 10 min    Received pt sitting on EOB upon arrival. Pt c/o feeling slightly wobbly/ off balance. Pt able to do stands and exercise. D/t low pressure, chose not to ambulate. Returned pt to EOB w/ all needs met. RN notified.   Pre Mobility:Seated BP 79/54 (63) During Mobility: Standing BP 67/46 (55)  Venetia Keel Mobility Specialist Please Neurosurgeon or Rehab Office at [phone]

## 2024-02-26 NOTE — Progress Notes (Signed)
 PHARMACY - ANTICOAGULATION CONSULT NOTE  Pharmacy Consult for heparin  >>apixaban  Indication: atrial fibrillation  Allergies  Allergen Reactions   Sulfa Antibiotics Other (See Comments)    Childhood allergy with unknown reaction    Patient Measurements: Height: 5' 4 (162.6 cm) Weight: 123.2 kg (271 lb 9.7 oz) IBW/kg (Calculated) : 54.7 HEPARIN  DW (KG): 85.5  Vital Signs: Temp: 97.9 F (36.6 C) (09/25 0815) Temp Source: Oral (09/25 0815) BP: 116/62 (09/25 0815) Pulse Rate: 98 (09/25 0815)  Labs: Recent Labs    02/24/24 0313 02/25/24 0331 02/25/24 0729 02/25/24 1513 02/25/24 1521 02/25/24 1527 02/25/24 1532  HGB 11.8*  --  12.8   < > 13.3  13.3 13.3 12.9  12.9  HCT 37.9  --  41.2   < > 39.0  39.0 39.0 38.0  38.0  PLT 209  --  222  --   --   --   --   APTT  --   --  57*  --   --   --   --   HEPARINUNFRC  --   --  0.91*  --   --   --   --   CREATININE 1.51*  --  1.40*  --   --   --   --   TROPONINIHS  --  18* 20*  --   --   --   --    < > = values in this interval not displayed.    Estimated Creatinine Clearance: 57.5 mL/min (A) (by C-G formula based on SCr of 1.4 mg/dL (H)).   Medical History: Past Medical History:  Diagnosis Date   Anxiety    Chronic systolic heart failure (HCC)    Depression    HTN (hypertension)    Microcytic anemia    NICM (nonischemic cardiomyopathy) (HCC) 11/2014   EF is 25% improved to 55% in 2015.   Pre-diabetes       Assessment: 34 yoF admitted with CHF and AF started on apixaban . Pt now will need L/RHC so apixaban  transitioned to heparin . LD of apixaban  this am ~0800.  Patient s/p RHC 9/24, restarted on heparin  last PM. Per cards note, Ok to resume apixaban  this AM. Confirmed with both Dr. Rolan and Dr. Christobal. Apixaban  copay $4, Will need education prior to d/c  Goal of Therapy:  Monitor platelets by anticoagulation protocol: Yes   Plan:  Apixaban  5mg  BID Continue heparin  drip until first dose of apixaban  given, then  d/c Will need education prior to d/c  Larraine Argo A. Lyle, PharmD, BCPS, FNKF Clinical Pharmacist [place] Please utilize Amion for appropriate phone number to reach the unit pharmacist [facility])

## 2024-02-26 NOTE — Progress Notes (Signed)
 Peripherally Inserted Central Catheter Placement  The IV Nurse has discussed with the patient and/or persons authorized to consent for the patient, the purpose of this procedure and the potential benefits and risks involved with this procedure.  The benefits include less needle sticks, lab draws from the catheter, and the patient may be discharged home with the catheter. Risks include, but not limited to, infection, bleeding, blood clot (thrombus formation), and puncture of an artery; nerve damage and irregular heartbeat and possibility to perform a PICC exchange if needed/ordered by physician.  Alternatives to this procedure were also discussed.  Bard Power PICC patient education guide, fact sheet on infection prevention and patient information card has been provided to patient /or left at bedside.    PICC Placement Documentation  PICC Double Lumen 02/26/24 Right Brachial 41 cm 1 cm (Active)  Indication for Insertion or Continuance of Line Vasoactive infusions;Administration of hyperosmolar/irritating solutions (i.e. TPN, Vancomycin, etc.) 02/26/24 1058  Exposed Catheter (cm) 1 cm 02/26/24 1058  Site Assessment Clean, Dry, Intact 02/26/24 1058  Lumen #1 Status Flushed;Saline locked;Blood return noted 02/26/24 1058  Lumen #2 Status Flushed;Saline locked;Blood return noted 02/26/24 1058  Dressing Type Transparent;Securing device 02/26/24 1058  Dressing Status Antimicrobial disc/dressing in place;Clean, Dry, Intact 02/26/24 1058  Line Care Connections checked and tightened 02/26/24 1058  Line Adjustment (NICU/IV Team Only) No 02/26/24 1058  Dressing Intervention New dressing;Adhesive placed at insertion site (IV team only) 02/26/24 1058  Dressing Change Due 03/04/24 02/26/24 1058       Abigail Sharp 02/26/2024, 11:12 AM

## 2024-02-26 NOTE — Progress Notes (Addendum)
 Patient refused PICC line stating she did not understand why something else had to be done. RN tried to educate the patient and advised the MD doing additional testing that required the line in order to help her appropriately. The patient continued to refuse and began to get a little agitated. The patient just kept stating she needed time to think tonight and would wait to talk to the MD tomorrow.  Due to no PICC line, RN was unable to do Co-ox.

## 2024-02-26 NOTE — Telephone Encounter (Signed)
Patient Product/process development scientist completed.    The patient is insured through [employer] [place].     Ran test claim for Entresto 24-26 mg and the current 30 day co-pay is $4.00.   This test claim was processed through [facility]- copay amounts may vary at other pharmacies due to pharmacy/plan contracts, or as the patient moves through the different stages of their insurance plan.     Roland Earl, CPHT Pharmacy Technician III Certified Patient Advocate [facility] Patient Advocate Team Direct Number: [phone]  Fax: [phone]

## 2024-02-26 NOTE — Progress Notes (Signed)
 MEWS Progress Note  Patient Details Name: Abigail Sharp MRN: [id number] DOB: August 27, 1965 Today's Date: 02/26/2024   MEWS Flowsheet Documentation:  Assess: MEWS Score Temp: 97.6 F (36.4 C) BP: 97/68 MAP (mmHg): 79 Pulse Rate: (!) 105 ECG Heart Rate: (!) 106 Resp: 19 Level of Consciousness: Alert SpO2: 100 % O2 Device: Room Air Patient Activity (if Appropriate): In bed O2 Flow Rate (L/min): 2 L/min Assess: MEWS Score MEWS Temp: 0 MEWS Systolic: 1 MEWS Pulse: 1 MEWS RR: 0 MEWS LOC: 0 MEWS Score: 2 MEWS Score Color: Yellow Assess: SIRS CRITERIA SIRS Temperature : 0 SIRS Respirations : 0 SIRS Pulse: 1 SIRS WBC: 0 SIRS Score Sum : 1 SIRS Temperature : 0 SIRS Pulse: 1 SIRS Respirations : 0 SIRS WBC: 0 SIRS Score Sum : 1 Assess: if the MEWS score is Yellow or Red Were vital signs accurate and taken at a resting state?: Yes Does the patient meet 2 or more of the SIRS criteria?: No MEWS guidelines implemented : Yes, yellow Treat MEWS Interventions: Considered administering scheduled or prn medications/treatments as ordered Take Vital Signs Increase Vital Sign Frequency : Yellow: Q2hr x1, continue Q4hrs until patient remains green for 12hrs Escalate MEWS: Escalate: Yellow: Discuss with charge nurse and consider notifying provider and/or RRT Notify: Charge Nurse/RN Name of Charge Nurse/RN Notified: sandra      Almarie DELENA Hock 02/26/2024, 12:33 PM

## 2024-02-26 NOTE — Progress Notes (Addendum)
 Physical Therapy Treatment Patient Details Name: Abigail Sharp MRN: [id number] DOB: 03-Mar-1966 Today's Date: 02/26/2024   History of Present Illness Pt is 58 yo presenting to [facility] on 9/16 due to acute heart failure on chronic. Remains severely volume overloaded on exam; S/p RHC 9/24 with severely elevated biventricular filling pressures. She has refused PICC line placement 9/25. Pt with symptomatic orthostatic hypotension working with mobility specialist 9/25. Plan on DCCV for AFL once euvolemic. PMH: CHF, anxiety/depression, agoraphobia, HTN, nonischemic cardiomyopathy, and HFimpEF.    PT Comments  Pt received in supine, c/o fatigue after working with mobility specialist recently when she had symptomatic orthostatic hypotension. Pt reports poor recall of RUE post-cath precs so PTA gave her handout on precs, including x5 days no pushing/pulling >10 lbs on RUE after initial 24 hours no RUE bending. Pt c/o LUE pain from BP cuff, RN notified. MD/RN notified pt may benefit from BLE compression socks ordered before next PT session to see if it helps with hemodynamic stability with postural changes. Per chart review, possible plan for pacemaker placement, disposition for home with HHPT pending progress. Pt continues to benefit from PT services to progress toward functional mobility goals.  Vital Signs  Pulse Rate 95  Pulse Rate Source Monitor  BP 124/87 (c/o pain wtih brachial cuff)  BP Location Left Arm  BP Method Automatic  Patient Position (if appropriate) Lying     If plan is discharge home, recommend the following: Assistance with cooking/housework;Assist for transportation;Help with stairs or ramp for entrance;A little help with walking and/or transfers   Can travel by private vehicle        Equipment Recommendations  None recommended by PT;Other (comment) (rollator if pt agreeable as she has not been able to construct the one she ordered out of pocket months ago)    Recommendations for  Other Services       Precautions / Restrictions Precautions Precautions: Fall;Other (comment) Recall of Precautions/Restrictions: Impaired Precaution/Restrictions Comments: symptomatic orthostatic hypotension 9/25, MD notified she may benefit from TED hose next session; x5 days RUE precs post-cath-handout in room Restrictions Weight Bearing Restrictions Per Provider Order: No     Mobility  Bed Mobility               General bed mobility comments: RN defer due to pt symptomatic drop in BP when getting up wtih mobility <1 hour prior    Transfers                   General transfer comment: defer; education only due to unstable BP    Ambulation/Gait                   Stairs             Wheelchair Mobility     Tilt Bed    Modified Rankin (Stroke Patients Only)       Balance                                            Communication Communication Communication: No apparent difficulties  Cognition Arousal: Alert Behavior During Therapy: Flat affect   PT - Cognitive impairments: No family/caregiver present to determine baseline, Problem solving, Safety/Judgement, Memory                       PT - Cognition Comments: Pt not  able to recall RUE precs post-cath; pt given handout to reinforce. Pt naming different people who may be able to assist her post-DC than she did in initial evaluation. Following commands: Intact      Cueing Cueing Techniques: Verbal cues  Exercises Other Exercises Other Exercises: PTA encouraging supine ankle pumps hourly and use of IS, pt has IS on bedside table but case mgmt arriving to speak with pt about DME so defer teachback at this time.    General Comments General comments (skin integrity, edema, etc.): PTA brought handout on Radial site precs post-RHC 9/24 with R radial access. Pt reports she was unaware of no RUE pushing/pulling >10 lbs for 5 days post-cath and no RUE flexing for 24  hours post-procedure. RN also educated on precs and handout highlighted for pt information.      Pertinent Vitals/Pain Pain Assessment Pain Assessment: Faces Faces Pain Scale: Hurts even more Pain Location: LUE with brachial cuff BP; pt reports BLE hurt worse when checked on her legs Pain Descriptors / Indicators: Aching, Discomfort, Grimacing, Moaning Pain Intervention(s): Limited activity within patient's tolerance, Monitored during session, Repositioned    Home Living                          Prior Function            PT Goals (current goals can now be found in the care plan section) Acute Rehab PT Goals PT Goal Formulation: All assessment and education complete, DC therapy Time For Goal Achievement: 03/04/24 Progress towards PT goals: Progressing toward goals (slowly today due to BP drop prior to session)    Frequency    Min 2X/week      PT Plan      Co-evaluation              AM-PAC PT 6 Clicks Mobility   Outcome Measure  Help needed turning from your back to your side while in a flat bed without using bedrails?: None Help needed moving from lying on your back to sitting on the side of a flat bed without using bedrails?: A Little (due to post-cath RUE precs) Help needed moving to and from a bed to a chair (including a wheelchair)?: A Little Help needed standing up from a chair using your arms (e.g., wheelchair or bedside chair)?: A Little Help needed to walk in hospital room?: A Lot (orthostatic today) Help needed climbing 3-5 steps with a railing? : Total (too orthostatic today to perform) 6 Click Score: 16    End of Session   Activity Tolerance: Treatment limited secondary to medical complications (Comment);Other (comment) (recent orthostatic hypotension, RN defer EOB/OOB for pt safety) Patient left: in bed;with call bell/phone within reach;with bed alarm set;Other (comment) (HOB >30 deg) Nurse Communication: Mobility status;Other  (comment);Precautions (RUE Radial post-cath precs; LUE pain from BP cuff; may need TED hose-MD to order) PT Visit Diagnosis: Unsteadiness on feet (R26.81);Muscle weakness (generalized) (M62.81)     Time: 8371-8359 PT Time Calculation (min) (ACUTE ONLY): 12 min  Charges:    $Self Care/Home Management: 8-22 PT General Charges $$ ACUTE PT VISIT: 1 Visit                     Viviann Broyles P., PTA Acute Rehabilitation Services Secure Chat Preferred 9a-5:30pm Office: [phone]    Connell HERO [facility] 02/26/2024, 5:17 PM

## 2024-02-26 NOTE — Progress Notes (Signed)
 Advanced Heart Failure Rounding Note  Cardiologist: Madonna Large, DO  Chief Complaint: Acute systolic heart failure Subjective:    -4L yesterday with 80 IV BID  Feels better this morning, sitting on EOB.   Objective:   Weight Range: 123.2 kg Body mass index is 46.62 kg/m.   Vital Signs:   Temp:  [97.9 F (36.6 C)-98.3 F (36.8 C)] 97.9 F (36.6 C) (09/25 0815) Pulse Rate:  [0-114] 98 (09/25 0815) Resp:  [11-34] 16 (09/25 0815) BP: (91-153)/(62-115) 116/62 (09/25 0815) SpO2:  [93 %-100 %] 100 % (09/25 0815) Weight:  [123.2 kg] 123.2 kg (09/25 0555) Last BM Date :  (02/24/24)  Weight change: Filed Weights   02/24/24 0556 02/24/24 1510 02/26/24 0555  Weight: 124.3 kg 125.5 kg 123.2 kg   Intake/Output:   Intake/Output Summary (Last 24 hours) at 02/26/2024 1118 Last data filed at 02/26/2024 0909 Gross per 24 hour  Intake 916.85 ml  Output 5150 ml  Net -4233.15 ml    Physical Exam    General:  obese appearing.  No respiratory difficulty Neck: JVD ~12 cm.  Cor: Irregular rate & rhythm. 2/6 MR murmur. Lungs: diminished bases Extremities: no edema  Neuro: alert & oriented x 3. Affect pleasant.   Telemetry   AFL 90s (Personally reviewed)    Labs    CBC Recent Labs    02/25/24 0729 02/25/24 1513 02/25/24 1532 02/26/24 0905  WBC 6.0  --   --  5.5  HGB 12.8   < > 12.9  12.9 13.5  HCT 41.2   < > 38.0  38.0 42.6  MCV 79.1*  --   --  77.9*  PLT 222  --   --  214   < > = values in this interval not displayed.   Basic Metabolic Panel Recent Labs    90/75/74 0729 02/25/24 1513 02/25/24 1532 02/26/24 0905  NA 137   < > 139  139 136  K 4.6   < > 4.6  4.6 4.3  CL 104  --   --  101  CO2 22  --   --  24  GLUCOSE 88  --   --  85  BUN 37*  --   --  43*  CREATININE 1.40*  --   --  1.61*  CALCIUM  8.9  --   --  9.0   < > = values in this interval not displayed.   Liver Function Tests No results for input(s): AST, ALT, ALKPHOS, BILITOT, PROT,  ALBUMIN in the last 72 hours. No results for input(s): LIPASE, AMYLASE in the last 72 hours. Cardiac Enzymes No results for input(s): CKTOTAL, CKMB, CKMBINDEX, TROPONINI in the last 72 hours.  BNP: BNP (last 3 results) Recent Labs    01/02/24 1504 02/17/24 1610  BNP 119.1* 856.6*    ProBNP (last 3 results) No results for input(s): PROBNP in the last 8760 hours.   D-Dimer No results for input(s): DDIMER in the last 72 hours. Hemoglobin A1C No results for input(s): HGBA1C in the last 72 hours. Fasting Lipid Panel No results for input(s): CHOL, HDL, LDLCALC, TRIG, CHOLHDL, LDLDIRECT in the last 72 hours. Thyroid Function Tests No results for input(s): TSH, T4TOTAL, T3FREE, THYROIDAB in the last 72 hours.  Invalid input(s): FREET3  Other results:   Imaging    US  EKG SITE RITE Result Date: 02/25/2024 If Site Rite image not attached, placement could not be confirmed due to current cardiac rhythm.  CARDIAC CATHETERIZATION Result  Date: 02/25/2024 Table formatting from the original result was not included. Images from the original result were not included. Dominance: Right  Mild two-vessel disease involving the LAD and OM 2  Prox LAD to Mid LAD lesion is 50% stenosed with 50% stenosed side branch in 1st Diag. Dist LAD lesion is 40% stenosed. Dist Cx lesion is 30% stenosed. 2nd Mrg lesion is 40% stenosed. Moderate pulmonary pretension with mean PAP 38 mmHg and PCWP 38 mmHg and LVEDP of 29 mmHg. Prominent berry is noted, but not giant. RECOMMENDATIONS   With significant elevated PA pressures and wedge pressure, she was given 40 mg IV Lasix  in the Cath Lab and will be started on 40 mg twice daily starting the morning of 02/26/2024   With CAD, may benefit from aspirin  but since she will be on DOAC for atrial flutter/fibrillation, would simply treat with monotherapy for now.  She will be restarted on IV heparin  2 hours post TR band removal.  Decision  on when to restart DOAC will be based on the next Epson her preprocedural evaluation. Alm Clay, MD    Medications:     Scheduled Medications:  acetaZOLAMIDE   500 mg Oral BID   amiodarone   400 mg Oral BID   apixaban   5 mg Oral BID   ARIPiprazole   5 mg Oral Daily   busPIRone   10 mg Oral BID   digoxin   0.125 mg Oral Daily   fluticasone   2 spray Each Nare Daily   furosemide   80 mg Intravenous BID   lamoTRIgine   25 mg Oral BID   sacubitril -valsartan   1 tablet Oral BID   sertraline   50 mg Oral Daily   sodium chloride  flush  3 mL Intravenous Q12H   sodium chloride  flush  3 mL Intravenous Q12H    Infusions:  sodium chloride       PRN Medications: sodium chloride , acetaminophen , alum & mag hydroxide-simeth, hydrALAZINE , sodium chloride , sodium chloride  flush    Patient Profile   58 y/o female w/ prior h/o NICM w/ improved EF, hypertension, obesity and CKD III, admitted w/ acute CHF w/ low output. Found to have severe MR on w/u. Hospital course further c/b new AFL.   Assessment/Plan   1. Acute Systolic Heart Failure w/ Low Output - 2D Echo this admit EF 40-45%. EF 35-40% on TEE w/ RV mod reduced - NICM. Has CAD but not enough to explain CM  - RHC w/ elevated Biv filling pressures and low CO in setting of severe MR (RA 15, PA 52/28 (38), PCW 38, PA sat 52%, CO 3.82, CI 1.7, PAPi 1.6). IABP placement not ideal given tachyarrhythmia. She is diuresing ok w/o need for inotropic support - continue diuresis. Continue  Lasix  80 mg bid, add diamox  500 mg BID - refused PICC. If renal function continues to climb will need to discuss this again - Off ? blocker w/ low output  - Continue Entresto  24-26 mg bid, BP much improved with Entresto  - Continue digoxin  0.125 mg daily.    2. Severe MR - suspect functional, severe LAE. Further c/b AFL  - needs HF + AFL optimization  - continue to push diuresis and afterload reduction per above - needs attempt at DCCV once better diuresed - will  need reassessment of MV once HF/AFL optimized. If still severe, will need assessment for mTEER    4. AFL - current rates in the 90s - stopping ? blocker given low output HF - Continue amiodarone  400 mg BID - needs attempt at  DCCV once fully diuresed. TEE this admit showed no LA thrombus - Continue eliquis  5 mg BID - LA diameter 4.6 cm. May not be good candidate for AFL ablation given obesity/BMI  - Needs outpatient assessment for OSA    5. CAD - LHC 02/25/24 w/ mild nonobstructive CAD - medical management  - no ASA given need for a/c - Start statin. LDL 92 9/25   6. AKI on CKD III - SCr 1.8 on admit - 1.4>1.6 with diureses - low-out on RHC but appears to be diuresing well w/ IV Lasix   - monitor UOP and SCr closely   Length of Stay: 8  Beckey LITTIE Coe, NP  02/26/2024, 11:18 AM  Advanced Heart Failure Team Pager [phone] (M-F; 7a - 5p)  Please contact [facility] for night-coverage after hours (5p -7a ) and weekends on [email or web address]

## 2024-02-26 NOTE — Progress Notes (Signed)
 PROGRESS NOTE Abigail Sharp  FMW:[id number] DOB: 05/07/1966 DOA: 02/17/2024 PCP: Pcp, No  Brief Narrative/Hospital Course: Abigail Sharp is a 58 y.o. female with PMH of hypertension, HFpEF, anxiety presenting with chest pain and shortness of breath.symoptoms going on for months aand worse with activity, some orthopnea. She gets pain all over on walking. Never had sleep apnea. She also c/o chest pain when she wakes up as she cannont breather and feels lie  heart coming out of chest  reports having cath in 2016 and no stent needed. She also c/o nasal stuffiness, cough for sometime. She has lots of complaints feeling hot. [name]  has been having nausea. Patient otherwise denies any nausea, vomiting,fever, chills, headache, focal weakness, numbness tingling, speech difficulties.  She endorses weight gain was 277 lb on 8/1 and repeat pending. In the ED:BP poorly controlled 160/110, saturating 90 to 97% on room air, heart rate 102 afebrile somewhat tachypneic in 20s EKG without STEMI.  Hemoglobin 11.2, FOBT negative, creatinine 1.6 (stable), troponin 26 > 29, BNP 856.  UA STABLE Chest x-ray showing vascular congestion and possible tiny pleural effusions.  Patient was given aspirin , IV Lasix  40 mg, and morphine  and admission was requested for CHF exacerbation Cardiology was consulted placed on heparin  for anticoagulation for A-fib, echo showed reduced EF 40-45% with moderate to severe MR. Patient was diuresed, also needing IV fluid support Patient has been transitioned on oral DOAC and tolerating well.  Underwent TEE 9/22 showed EF 35-40% moderately reduced RV systolic function, severe MR, EF 35-40%. 9/24:LHC/RHC showed nonobstructive CAD and significantly elevated right and left heart filling pressures with low CI 1.7. SBP 130s-140s. >  Subsequently advanced heart failure team consulted  Subjective: Seen and examined today Overnight vitals fairly stable on room air Labs pending Agreeable for PICC this  am No chest pain shortness of breath nausea or vomiting  Assessment and plan:  Acute on chronic HFrEF w. EF 35-40%, with low output Severe MR, PFO/moderate RV dysfunction small pericardial effusion NICM Chest pain and Demand ischemia-LHC with no obstructive disease Orthostatic hypotension: CHF exacerbation in the setting of noncompliance and with severe MR.  weight when in August was 277 pounds on admission to 284 lb> improved to 124.3 kg with diuresis BP orthostatic got 500 cc bolus 9/19 and Lasix  on hold TTE :EF 40-45%, GHNK TEE>Severe MR EF 35-40%,PFO/moderate RV dysfunction small pericardial effusion LHC/RHC 9/24 showed elevated right and left filling pressures significantly AHF team has been consulted with plan for PICC placement to follow Choloxin CVP, working on afterload reduction in the setting of severe MR.  On Lasix  80 twice daily, digoxin  0.25, Entresto  started 9/24 Continue GDMT diuretics as per cardiology Cont to monitor daily I/O,weight, electrolytes  w/ salt/fluid restricted diet and monitor in tele Net IO Since Admission: -17,357.37 mL [02/26/24 0954]  Filed Weights   02/24/24 0556 02/24/24 1510 02/26/24 0555  Weight: 124.3 kg 125.5 kg 123.2 kg    Recent Labs  Lab 02/20/24 0343 02/21/24 0348 02/22/24 0244 02/23/24 0403 02/24/24 0313 02/25/24 0729 02/25/24 1513 02/25/24 1521 02/25/24 1527 02/25/24 1532  BUN 41* 36* 37* 38* 38* 37*  --   --   --   --   CREATININE 2.12* 1.92* 1.65* 1.49* 1.51* 1.40*  --   --   --   --   K 4.0 4.4 4.7 4.5 4.4 4.6 4.7 4.6  4.6 4.7 4.6  4.6  MG 1.8 1.8  --   --   --   --   --   --   --   --  AKI on CKD 3B vs CKD 4: Last creatinine in August was 2.1 although previously up to 1.2.Suspect her creatinine is around baseline now in 1.5 Monitor avoid nephrotoxic medications Recent Labs    01/02/24 1504 01/02/24 2353 02/17/24 1610 02/19/24 0342 02/20/24 0343 02/21/24 0348 02/22/24 0244 02/23/24 0403 02/24/24 0313  02/25/24 0729 02/25/24 1513 02/25/24 1521 02/25/24 1527 02/25/24 1532  BUN 39*  --  29* 30* 41* 36* 37* 38* 38* 37*  --   --   --   --   CREATININE 2.18*  --  1.61* 1.83* 2.12* 1.92* 1.65* 1.49* 1.51* 1.40*  --   --   --   --   CO2 21*  --  21* 26 27 27 26 24 26 22   --   --   --   --   K 4.8   < > 4.5 4.4 4.0 4.4 4.7 4.5 4.4 4.6 4.7 4.6  4.6 4.7 4.6  4.6   < > = values in this interval not displayed.    New onset Atrial flutter: TSH normal. Switching back to Eliquis , continue amiodarone  p.o. 400 twice daily, digoxin  and optimize volume   Prolonged Qtc: QTc 9/20 477 previously 503 on 9/19.  Celexa  dose has been decreased.  Monitor  Microcytic anemia Hemoglobin stable   Anxiety/depression/OCD: Mood stable.non compliant with psych meds at home seen by psychiatry> reintroduced some meds  Continue Abilify , BuSpar  Lamictal  and Zoloft  slightly lower dose due to prolonged QTc  Morbid Obesity w/ Body mass index is 46.62 kg/m.: Will benefit with PCP follow-up, weight loss,healthy lifestyle and outpatient sleep eval if not done.  Mobility: PT Orders:  PT Follow up Rec: Home Health Pt9/22/2025 1538   DVT prophylaxis:  Code Status:   Code Status: Full Code Family Communication: plan of care discussed with patient at bedside. Patient status is: Remains hospitalized because of severity of illness Level of care: Telemetry Cardiac   Dispo: The patient is from: home            Anticipated disposition: Home with home health once cleared from cardiology   Objective: Vitals last 24 hrs: Vitals:   02/25/24 2353 02/26/24 0322 02/26/24 0555 02/26/24 0815  BP: 91/72 109/70  116/62  Pulse: (!) 108 (!) 105 (!) 114 98  Resp: 20 19 (!) 24 16  Temp: 98.3 F (36.8 C) 97.9 F (36.6 C)  97.9 F (36.6 C)  TempSrc: Oral Oral  Oral  SpO2: 97% 98% 93% 100%  Weight:   123.2 kg   Height:        Physical Examination: General exam: AAOX3, pleasant HEENT:Oral mucosa moist, Ear/Nose WNL  grossly Respiratory system: B/l diminished BS at base, no use of accessory muscle Cardiovascular system: S1 & S2 +, No JVD. Gastrointestinal system: Abdomen soft,NT,ND, BS+ Nervous System: Alert, awake, moving all extremities,and following commands. Extremities: extremities warm, leg edema + Skin: No rashes,no icterus. MSK: Normal muscle bulk,tone, power   Medications reviewed:  Scheduled Meds:  amiodarone   400 mg Oral BID   apixaban   5 mg Oral BID   ARIPiprazole   5 mg Oral Daily   busPIRone   10 mg Oral BID   digoxin   0.125 mg Oral Daily   fluticasone   2 spray Each Nare Daily   furosemide   80 mg Intravenous BID   lamoTRIgine   25 mg Oral BID   sacubitril -valsartan   1 tablet Oral BID   sertraline   50 mg Oral Daily   sodium chloride  flush  3 mL Intravenous  Q12H   sodium chloride  flush  3 mL Intravenous Q12H   Continuous Infusions:  sodium chloride      heparin  Stopped (02/26/24 0945)    Diet: Diet Order             Diet Heart Room service appropriate? Yes; Fluid consistency: Thin  Diet effective now                    Data Reviewed: I have personally reviewed following labs and imaging studies ( see epic result tab) CBC: Recent Labs  Lab 02/21/24 0348 02/23/24 0403 02/24/24 0313 02/25/24 0729 02/25/24 1513 02/25/24 1521 02/25/24 1527 02/25/24 1532 02/26/24 0905  WBC 6.6 5.6 5.1 6.0  --   --   --   --  5.5  HGB 11.2* 11.7* 11.8* 12.8 13.3 13.3  13.3 13.3 12.9  12.9 13.5  HCT 36.3 38.1 37.9 41.2 39.0 39.0  39.0 39.0 38.0  38.0 42.6  MCV 79.1* 79.2* 79.5* 79.1*  --   --   --   --  77.9*  PLT 261 221 209 222  --   --   --   --  214   CMP: Recent Labs  Lab 02/20/24 0343 02/21/24 0348 02/22/24 0244 02/23/24 0403 02/24/24 0313 02/25/24 0729 02/25/24 1513 02/25/24 1521 02/25/24 1527 02/25/24 1532  NA 139 139 138 135 141 137 138 137  139 139 139  139  K 4.0 4.4 4.7 4.5 4.4 4.6 4.7 4.6  4.6 4.7 4.6  4.6  CL 99 101 103 102 104 104  --   --   --   --    CO2 27 27 26 24 26 22   --   --   --   --   GLUCOSE 98 75 87 85 83 88  --   --   --   --   BUN 41* 36* 37* 38* 38* 37*  --   --   --   --   CREATININE 2.12* 1.92* 1.65* 1.49* 1.51* 1.40*  --   --   --   --   CALCIUM  8.4* 8.9 8.9 8.6* 8.9 8.9  --   --   --   --   MG 1.8 1.8  --   --   --   --   --   --   --   --    GFR: Estimated Creatinine Clearance: 57.5 mL/min (A) (by C-G formula based on SCr of 1.4 mg/dL (H)). Recent Labs  Lab 02/20/24 0343 02/21/24 0348  AST 17 16  ALT 16 15  ALKPHOS 67 60  BILITOT 0.7 0.5  PROT 6.0* 6.0*  ALBUMIN 3.1* 3.0*   No results for input(s): LIPASE, AMYLASE in the last 168 hours. No results for input(s): AMMONIA in the last 168 hours. Coagulation Profile: No results for input(s): INR, PROTIME in the last 168 hours. Unresulted Labs (From admission, onward)     Start     Ordered   02/26/24 0500  Lipoprotein A (LPA)  Tomorrow morning,   R       Question:  Specimen collection method  Answer:  Lab=Lab collect   02/25/24 1613   02/25/24 1808  Cooxemetry Panel (carboxy, met, total hgb, O2 sat)  Daily,   R     Question:  Specimen collection method  Answer:  Lab=Lab collect   02/25/24 1807   02/25/24 0500  Basic metabolic panel with GFR  Daily,   R  Question:  Specimen collection method  Answer:  Lab=Lab collect   02/24/24 1028   02/25/24 0500  CBC  Daily,   R     Question:  Specimen collection method  Answer:  Lab=Lab collect   02/24/24 1028   02/18/24 1442  Draw extra clot tube  (Creatinine Clearance, urine, 24-hour panel)  Once,   R        02/18/24 1441           Antimicrobials/Microbiology: Anti-infectives (From admission, onward)    None         Component Value Date/Time   SDES URINE, CLEAN CATCH 01/03/2024 0025   SPECREQUEST  01/03/2024 0025    NONE Performed at [facility], [address] [place], [place] [address]    CULT 70,000 COLONIES/mL ESCHERICHIA COLI (A) 01/03/2024 0025   REPTSTATUS 01/05/2024 FINAL  01/03/2024 0025   Procedures: Procedure(s) (LRB): RIGHT/LEFT HEART CATH AND CORONARY ANGIOGRAPHY (N/A) Mennie LAMY, MD [facility] 02/26/2024, 9:55 AM

## 2024-02-26 NOTE — TOC Progression Note (Addendum)
 Transition of Care [facility]) - Progression Note    Patient Details  Name: Abigail Sharp MRN: [id number] Date of Birth: 1966/03/13  Transition of Care [facility]) CM/SW Contact  Justina Delcia Czar, RN Phone Number: [phone] 02/26/2024, 6:07 PM  Clinical Narrative:     Spoke to pt and states she lives at home alone. She has Rollator, and bedside commode. States she can get a scale for daily weights. Provided pt with Living Better with HF booklet. Educated on importance of heart healthy/low sodium diet. States her niece assist her with transportation and will assist with transportation home.  Pt is interested in a light weight manual wheelchair. Will need PT evaluation and recommendations. Message sent to attending.  Expected Discharge Plan: Home/Self Care Barriers to Discharge: Continued Medical Work up   Expected Discharge Plan and Services   Discharge Planning Services: CM Consult   Living arrangements for the past 2 months: Apartment                   Social Drivers of Health (SDOH) Interventions SDOH Screenings   Food Insecurity: No Food Insecurity (02/18/2024)  Housing: Low Risk  (02/18/2024)  Transportation Needs: No Transportation Needs (02/18/2024)  Utilities: Not At Risk (02/18/2024)  Depression (PHQ2-9): High Risk (06/19/2023)  Tobacco Use: Low Risk  (02/17/2024)    Readmission Risk Interventions     No data to display

## 2024-02-27 ENCOUNTER — Inpatient Hospital Stay (HOSPITAL_COMMUNITY): Payer: MEDICAID

## 2024-02-27 ENCOUNTER — Encounter (HOSPITAL_COMMUNITY)
Admission: EM | Disposition: A | Discharge status: Home Health | Payer: Self-pay | Source: Home / Self Care | Attending: Internal Medicine

## 2024-02-27 DIAGNOSIS — I5031 Acute diastolic (congestive) heart failure: Secondary | ICD-10-CM | POA: Diagnosis not present

## 2024-02-27 DIAGNOSIS — F418 Other specified anxiety disorders: Secondary | ICD-10-CM

## 2024-02-27 DIAGNOSIS — I5023 Acute on chronic systolic (congestive) heart failure: Secondary | ICD-10-CM

## 2024-02-27 DIAGNOSIS — I4892 Unspecified atrial flutter: Secondary | ICD-10-CM | POA: Diagnosis not present

## 2024-02-27 DIAGNOSIS — I110 Hypertensive heart disease with heart failure: Secondary | ICD-10-CM

## 2024-02-27 HISTORY — PX: CARDIOVERSION: EP1203

## 2024-02-27 LAB — BASIC METABOLIC PANEL WITH GFR
Anion gap: 12 (ref 5–15)
BUN: 55 mg/dL — ABNORMAL HIGH (ref 6–20)
CO2: 24 mmol/L (ref 22–32)
Calcium: 8.8 mg/dL — ABNORMAL LOW (ref 8.9–10.3)
Chloride: 101 mmol/L (ref 98–111)
Creatinine, Ser: 1.96 mg/dL — ABNORMAL HIGH (ref 0.44–1.00)
GFR, Estimated: 29 mL/min — ABNORMAL LOW (ref >60)
Glucose, Bld: 107 mg/dL — ABNORMAL HIGH (ref 70–99)
Potassium: 4 mmol/L (ref 3.5–5.1)
Sodium: 137 mmol/L (ref 135–145)

## 2024-02-27 LAB — COOXEMETRY PANEL
Carboxyhemoglobin: 2.6 % — ABNORMAL HIGH (ref 0.5–1.5)
Methemoglobin: 1.4 % (ref 0.0–1.5)
O2 Saturation: 65.3 %
Total hemoglobin: 14.5 g/dL (ref 12.0–16.0)

## 2024-02-27 LAB — CBC
HCT: 43.6 % (ref 36.0–46.0)
Hemoglobin: 13.8 g/dL (ref 12.0–15.0)
MCH: 24.7 pg — ABNORMAL LOW (ref 26.0–34.0)
MCHC: 31.7 g/dL (ref 30.0–36.0)
MCV: 78 fL — ABNORMAL LOW (ref 80.0–100.0)
Platelets: 232 K/uL (ref 150–400)
RBC: 5.59 MIL/uL — ABNORMAL HIGH (ref 3.87–5.11)
RDW: 18.5 % — ABNORMAL HIGH (ref 11.5–15.5)
WBC: 6.3 K/uL (ref 4.0–10.5)
nRBC: 0 % (ref 0.0–0.2)

## 2024-02-27 LAB — GLUCOSE, CAPILLARY: Glucose-Capillary: 87 mg/dL (ref 70–99)

## 2024-02-27 SURGERY — CARDIOVERSION (CATH LAB)
Anesthesia: General

## 2024-02-27 MED ORDER — SODIUM CHLORIDE 0.9% FLUSH
INTRAVENOUS | Status: DC | PRN
  Administered 2024-02-27 (×2): 5 mL via INTRAVENOUS

## 2024-02-27 MED ORDER — SODIUM CHLORIDE 0.9 % IV SOLN
INTRAVENOUS | Status: DC
Start: 2024-02-27 — End: 2024-02-27

## 2024-02-27 MED ORDER — PHENYLEPHRINE HCL (PRESSORS) 10 MG/ML IV SOLN
INTRAVENOUS | Status: DC | PRN
  Administered 2024-02-27: 160 ug via INTRAVENOUS

## 2024-02-27 MED ORDER — LIDOCAINE 2% (20 MG/ML) 5 ML SYRINGE
INTRAMUSCULAR | Status: DC | PRN
  Administered 2024-02-27: 100 mg via INTRAVENOUS

## 2024-02-27 MED ORDER — PROPOFOL 10 MG/ML IV BOLUS
INTRAVENOUS | Status: DC | PRN
  Administered 2024-02-27: 50 mg via INTRAVENOUS

## 2024-02-27 SURGICAL SUPPLY — 1 items: PAD DEFIB RADIO PHYSIO CONN (PAD) ×1 IMPLANT

## 2024-02-27 NOTE — Progress Notes (Signed)
 Advanced Heart Failure Rounding Note  Cardiologist: Madonna Large, DO  Chief Complaint: Acute systolic heart failure Subjective:    -3.9L yesterday with 80 IV x1 (PM dose held) + diamox  BID  CVP 3. Co-ox 65%  SCr 1.61>1.96  Feels ok this morning just tired. Denies CP/SOB.   Objective:   Weight Range: 120.9 kg Body mass index is 45.75 kg/m.   Vital Signs:   Temp:  [97.6 F (36.4 C)-98 F (36.7 C)] 97.7 F (36.5 C) (09/26 0744) Pulse Rate:  [95-106] 101 (09/26 0744) Resp:  [14-20] 20 (09/26 0645) BP: (90-124)/(48-87) 110/56 (09/26 0744) SpO2:  [96 %-100 %] 96 % (09/26 0744) Weight:  [120.9 kg] 120.9 kg (09/26 0346) Last BM Date : 02/26/24  Weight change: Filed Weights   02/24/24 1510 02/26/24 0555 02/27/24 0346  Weight: 125.5 kg 123.2 kg 120.9 kg   Intake/Output:   Intake/Output Summary (Last 24 hours) at 02/27/2024 0836 Last data filed at 02/27/2024 0600 Gross per 24 hour  Intake 619.89 ml  Output 3100 ml  Net -2480.11 ml    Physical Exam    General:  obese appearing.  No respiratory difficulty Neck: JVD ~5 cm.  Cor: Regular rate & irregular rhythm. 2/6 MR murmur. Lungs: clear Extremities: no edema  Neuro: alert & oriented x 3. Affect pleasant.   Telemetry   AFL 90s (Personally reviewed)    Labs    CBC Recent Labs    02/26/24 0905 02/27/24 0425  WBC 5.5 6.3  HGB 13.5 13.8  HCT 42.6 43.6  MCV 77.9* 78.0*  PLT 214 232   Basic Metabolic Panel Recent Labs    90/74/74 0905 02/27/24 0425  NA 136 137  K 4.3 4.0  CL 101 101  CO2 24 24  GLUCOSE 85 107*  BUN 43* 55*  CREATININE 1.61* 1.96*  CALCIUM  9.0 8.8*   Liver Function Tests No results for input(s): AST, ALT, ALKPHOS, BILITOT, PROT, ALBUMIN in the last 72 hours. No results for input(s): LIPASE, AMYLASE in the last 72 hours. Cardiac Enzymes No results for input(s): CKTOTAL, CKMB, CKMBINDEX, TROPONINI in the last 72 hours.  BNP: BNP (last 3 results) Recent  Labs    01/02/24 1504 02/17/24 1610  BNP 119.1* 856.6*    ProBNP (last 3 results) No results for input(s): PROBNP in the last 8760 hours.   D-Dimer No results for input(s): DDIMER in the last 72 hours. Hemoglobin A1C No results for input(s): HGBA1C in the last 72 hours. Fasting Lipid Panel No results for input(s): CHOL, HDL, LDLCALC, TRIG, CHOLHDL, LDLDIRECT in the last 72 hours. Thyroid Function Tests No results for input(s): TSH, T4TOTAL, T3FREE, THYROIDAB in the last 72 hours.  Invalid input(s): FREET3  Other results:   Imaging    DG Chest Port 1 View Result Date: 02/26/2024 EXAM: 1 VIEW(S) XRAY OF THE CHEST 02/26/2024 12:03:00 PM COMPARISON: 02/17/2024 CLINICAL HISTORY: S/P PICC central line placement FINDINGS: LINES, TUBES AND DEVICES: Right PICC in place with tip at superior cavoatrial junction. LUNGS AND PLEURA: New platelet atelectasis in the left mid lung. No pulmonary edema. No pleural effusion. No pneumothorax. HEART AND MEDIASTINUM: No acute abnormality of the cardiac and mediastinal silhouettes. BONES AND SOFT TISSUES: No acute osseous abnormality. IMPRESSION: 1. Right upper extremity PICC line with tip at the superior cavoatrial junction, appropriately positioned. 2. New plate-like atelectasis in the left mid lung. Electronically signed by: Waddell Calk MD 02/26/2024 12:19 PM EDT RP Workstation: [id number]     Medications:  Scheduled Medications:  amiodarone   400 mg Oral BID   apixaban   5 mg Oral BID   ARIPiprazole   5 mg Oral Daily   atorvastatin   40 mg Oral Daily   busPIRone   10 mg Oral BID   Chlorhexidine  Gluconate Cloth  6 each Topical Daily   digoxin   0.125 mg Oral Daily   fluticasone   2 spray Each Nare Daily   lamoTRIgine   25 mg Oral BID   sertraline   50 mg Oral Daily   sodium chloride  flush  10-40 mL Intracatheter Q12H   sodium chloride  flush  10-40 mL Intracatheter Q12H   sodium chloride  flush  3 mL Intravenous Q12H    sodium chloride  flush  3 mL Intravenous Q12H    Infusions:    PRN Medications: acetaminophen , alum & mag hydroxide-simeth, sodium chloride , sodium chloride  flush, sodium chloride  flush, sodium chloride  flush    Patient Profile   58 y/o female w/ prior h/o NICM w/ improved EF, hypertension, obesity and CKD III, admitted w/ acute CHF w/ low output. Found to have severe MR on w/u. Hospital course further c/b new AFL.   Assessment/Plan   1. Acute Systolic Heart Failure w/ Low Output - 2D Echo this admit EF 40-45%. EF 35-40% on TEE w/ RV mod reduced - NICM. Has CAD but not enough to explain CM  - RHC w/ elevated Biv filling pressures and low CO in setting of severe MR (RA 15, PA 52/28 (38), PCW 38, PA sat 52%, CO 3.82, CI 1.7, PAPi 1.6). IABP placement not ideal given tachyarrhythmia. She diuresed ok w/o need for inotropic support - Hold further diuresis at this time. With SCr rise and CVP 3. Plan to start PO diuretics tomorrow vs Sunday - Off ? blocker w/ low output  - Hold Entresto   - Continue digoxin  0.125 mg daily. Check level tomorrow, suspect Scr will come down.    2. Severe MR - suspect functional, severe LAE. Further c/b AFL  - needs HF + AFL optimization  - continue to push diuresis and afterload reduction per above - needs attempt at DCCV once better diuresed - will need reassessment of MV once HF/AFL optimized. If still severe, will need assessment for mTEER    4. AFL - current rates in the 90s - off ? blocker given low output HF - Continue amiodarone  400 mg BID. Decrease to 200 mg BID 9/29 pm - TEE this admit showed no LA thrombus - Continue eliquis  5 mg BID - LA diameter 4.6 cm. May not be good candidate for AFL ablation given obesity/BMI  - Needs outpatient assessment for OSA  - Plan for DCCV this afternoon.   Informed Consent   Shared Decision Making/Informed Consent The risks (stroke, cardiac arrhythmias rarely resulting in the need for a temporary or  permanent pacemaker, skin irritation or burns and complications associated with conscious sedation including aspiration, arrhythmia, respiratory failure and death), benefits (restoration of normal sinus rhythm) and alternatives of a direct current cardioversion were explained in detail to Ms. Beckstrom and she agrees to proceed.         5. CAD - LHC 02/25/24 w/ mild nonobstructive CAD - medical management  - no ASA given need for a/c - Continue statin. LDL 92 9/25   6. AKI on CKD III - SCr 1.8 on admit - 1.4>1.6>1.9 with diureses - low-out on RHC but diuresed well w/ IV Lasix   - Hold further diuretics - monitor UOP and SCr closely   Length of Stay:  9  Beckey LITTIE Coe, NP  02/27/2024, 8:36 AM  Advanced Heart Failure Team Pager [phone] (M-F; 7a - 5p)  Please contact [facility] for night-coverage after hours (5p -7a ) and weekends on [email or web address]

## 2024-02-27 NOTE — Progress Notes (Signed)
 Abigail Sharp  FMW:[id number] DOB: [birth year] DOA: 02/17/2024 PCP: de [place], Raymond J, MD    Brief Narrative:  58 year old with a history of HTN and CHF who presented to the ER 9/16 with chest pain and shortness of breath which has been steadily building for months.  She was found to have uncontrolled hypertension with presenting blood pressure 160/110.  EKG was nonacute.  Hemoglobin was 11.2.  BNP was elevated 856.  Chest x-ray revealed vascular congestion.  Following her admission a TTE was accomplished which noted EF of 40-45% with moderate to severe MR.  She was noted to be in atrial fibrillation for which heparin  was initiated.  She was diuresed.  As she has stabilized she has been transition to DOAC.  She underwent TEE 9/22 which noted EF 35-40% with moderately reduced RV systolic function and severe MR.  Left and right heart cath 9/24 noted nonobstructive coronary artery disease and significantly elevated right and left heart filling pressures, which led to an advanced heart failure team consultation.  Goals of Care:   Code Status: Full Code   DVT prophylaxis:  apixaban  (ELIQUIS ) tablet 5 mg   Interim Hx: The patient is seen in her room status post DCCV.  She tolerated the procedure well and is presently in NSR.  She is alert and conversant and denies any complaints.  Assessment & Plan:  Acute systolic congestive heart failure Care per advanced CHF team -appears well compensated on exam at this time  Severe MR Care per cardiology - will need reassessment once heart failure optimized to determine if intervention appropriate  Newly appreciated atrial flutter/atrial fibrillation TSH normal -amiodarone , digoxin , and Eliquis  per cardiology - s/p successful DCCV today  AKI on CKD stage IIIb Baseline creatinine for past month has been approximately 1.5 -continue to monitor trend as creatinine is variable with use of diuretic  Prolonged QTc Celexa  dose decreased during this  admission  Anxiety/depression/OCD Noncompliant with medications at home -followed by psychiatry   Morbid obesity - Body mass index is 45.75 kg/m.   Family Communication: No family present at time of exam Disposition:   Home Health Pt (Pending Progress With Upcoming Procedures)02/26/2024 1600  Objective: Blood pressure (!) 110/56, pulse (!) 101, temperature 97.7 F (36.5 C), temperature source Oral, resp. rate 20, height 5' 4 (1.626 m), weight 120.9 kg, last menstrual period 03/29/2016, SpO2 96%.  Intake/Output Summary (Last 24 hours) at 02/27/2024 1016 Last data filed at 02/27/2024 0600 Gross per 24 hour  Intake 193 ml  Output 2200 ml  Net -2007 ml   Filed Weights   02/24/24 1510 02/26/24 0555 02/27/24 0346  Weight: 125.5 kg 123.2 kg 120.9 kg    Examination: General: No acute respiratory distress Lungs: Clear to auscultation bilaterally without wheezes or crackles Cardiovascular: Regular rate and rhythm without murmur gallop or rub normal S1 and S2 Abdomen: Nontender, nondistended, soft, bowel sounds positive, no rebound, no ascites, no appreciable mass Extremities: No significant cyanosis, clubbing, or edema bilateral lower extremities  CBC: Recent Labs  Lab 02/25/24 0729 02/25/24 1513 02/25/24 1532 02/26/24 0905 02/27/24 0425  WBC 6.0  --   --  5.5 6.3  HGB 12.8   < > 12.9  12.9 13.5 13.8  HCT 41.2   < > 38.0  38.0 42.6 43.6  MCV 79.1*  --   --  77.9* 78.0*  PLT 222  --   --  214 232   < > = values in this interval not displayed.  Basic Metabolic Panel: Recent Labs  Lab 02/21/24 0348 02/22/24 0244 02/25/24 0729 02/25/24 1513 02/25/24 1532 02/26/24 0905 02/27/24 0425  NA 139   < > 137   < > 139  139 136 137  K 4.4   < > 4.6   < > 4.6  4.6 4.3 4.0  CL 101   < > 104  --   --  101 101  CO2 27   < > 22  --   --  24 24  GLUCOSE 75   < > 88  --   --  85 107*  BUN 36*   < > 37*  --   --  43* 55*  CREATININE 1.92*   < > 1.40*  --   --  1.61* 1.96*   CALCIUM  8.9   < > 8.9  --   --  9.0 8.8*  MG 1.8  --   --   --   --   --   --    < > = values in this interval not displayed.   GFR: Estimated Creatinine Clearance: 40.6 mL/min (A) (by C-G formula based on SCr of 1.96 mg/dL (H)).   Scheduled Meds:  amiodarone   400 mg Oral BID   apixaban   5 mg Oral BID   ARIPiprazole   5 mg Oral Daily   atorvastatin   40 mg Oral Daily   busPIRone   10 mg Oral BID   Chlorhexidine  Gluconate Cloth  6 each Topical Daily   digoxin   0.125 mg Oral Daily   fluticasone   2 spray Each Nare Daily   lamoTRIgine   25 mg Oral BID   sertraline   50 mg Oral Daily   sodium chloride  flush  10-40 mL Intracatheter Q12H   sodium chloride  flush  10-40 mL Intracatheter Q12H   sodium chloride  flush  3 mL Intravenous Q12H   sodium chloride  flush  3 mL Intravenous Q12H      LOS: 9 days   Reyes IVAR Moores, MD [facility] Office  [phone] Pager - Text Page per Tracey  If 7PM-7AM, please contact night-coverage per Amion 02/27/2024, 10:16 AM

## 2024-02-27 NOTE — Progress Notes (Signed)
 Occupational Therapy Treatment Patient Details Name: Abigail Sharp MRN: [id number] DOB: 11-15-1965 Today's Date: 02/27/2024   History of present illness Pt is 58 yo presenting to [facility] on 9/16 due to acute heart failure on chronic. Remains severely volume overloaded on exam; S/p RHC 9/24 with severely elevated biventricular filling pressures. She has refused PICC line placement 9/25. Pt with symptomatic orthostatic hypotension working with mobility specialist 9/25. Plan on DCCV for AFL once euvolemic. PMH: CHF, anxiety/depression, agoraphobia, HTN, nonischemic cardiomyopathy, and HFimpEF.   OT comments  Pt. Seen for skilled OT treatment session.  Tx session limited secondary to pt. With notable fatigue and reports feeling like she couldn't do anything.  Eyes closed for most of session.  Pt. Able to complete bed mobility with S and cues for limited use of RUE/maintaining precautions from recent RHC 9/24.  RN made aware of BPs taken during session.  Will cont. With current acute OT POC.    SUPINE: 96/69-94 L SIDE LYING AFTER BED MOBILITY: 103/71-93      If plan is discharge home, recommend the following:  A little help with walking and/or transfers;A lot of help with bathing/dressing/bathroom   Equipment Recommendations  BSC/3in1    Recommendations for Other Services      Precautions / Restrictions Precautions Precautions: Fall;Other (comment) Recall of Precautions/Restrictions: Impaired Precaution/Restrictions Comments: symptomatic orthostatic hypotension 9/25, MD notified she may benefit from TED hose next session; x5 days RUE precs post-cath-handout in room       Mobility Bed Mobility Overal bed mobility: Needs Assistance             General bed mobility comments: cues for limited use of RUE during bed mobility.  pt. able to use BLEs to push up in bed with use of LUE and bed rail. pt. then able to roll into L sidelying without assistance and immediately fell asleep     Transfers                         Balance                                           ADL either performed or assessed with clinical judgement   ADL Overall ADL's : Needs assistance/impaired                                       General ADL Comments: session limited due to visible fatigue, eyes closed for most of session, short one word answers, feels tired and bad all over like you cant do anything    Extremity/Trunk Assessment              Vision       Perception     Praxis     Communication     Cognition Arousal: Lethargic Behavior During Therapy: Flat affect Cognition: No family/caregiver present to determine baseline, Cognition impaired Pt. Unable to recall any precautions of RUE from yesterday.  Attempted to review with pt. And she had eyes closed and was not participatory in the review  Cueing      Exercises      Shoulder Instructions       General Comments      Pertinent Vitals/ Pain       Pain Assessment Pain Assessment: No/denies pain  Home Living                                          Prior Functioning/Environment              Frequency  Min 2X/week        Progress Toward Goals  OT Goals(current goals can now be found in the care plan section)  Progress towards OT goals: Progressing toward goals     Plan      Co-evaluation                 AM-PAC OT 6 Clicks Daily Activity     Outcome Measure   Help from another person eating meals?: None Help from another person taking care of personal grooming?: A Little Help from another person toileting, which includes using toliet, bedpan, or urinal?: A Lot Help from another person bathing (including washing, rinsing, drying)?: A Lot Help from another person to put on and taking off regular upper body clothing?: A Little Help from another person to  put on and taking off regular lower body clothing?: A Lot 6 Click Score: 16    End of Session    OT Visit Diagnosis: Unsteadiness on feet (R26.81);Other abnormalities of gait and mobility (R26.89);Repeated falls (R29.6);Muscle weakness (generalized) (M62.81);History of falling (Z91.81)   Activity Tolerance Patient limited by fatigue   Patient Left in bed;with call bell/phone within reach;with bed alarm set   Nurse Communication Other (comment) (updated rn on BPs and pt. fatigue)        Time: 9085-9075 OT Time Calculation (min): 10 min  Charges: OT General Charges $OT Visit: 1 Visit OT Treatments $Therapeutic Activity: 8-22 mins  Randall, COTA/L Acute Rehabilitation [phone]   CHRISTELLA Nest Lorraine-COTA/L  02/27/2024, 9:38 AM

## 2024-02-27 NOTE — Plan of Care (Signed)
  Problem: Education: Goal: Knowledge of General Education information will improve Description: Including pain rating scale, medication(s)/side effects and non-pharmacologic comfort measures Outcome: Progressing   Problem: Health Behavior/Discharge Planning: Goal: Ability to manage health-related needs will improve Outcome: Progressing   Problem: Clinical Measurements: Goal: Respiratory complications will improve Outcome: Progressing   Problem: Activity: Goal: Risk for activity intolerance will decrease Outcome: Progressing   Problem: Nutrition: Goal: Adequate nutrition will be maintained Outcome: Progressing   Problem: Coping: Goal: Level of anxiety will decrease Outcome: Progressing   Problem: Elimination: Goal: Will not experience complications related to bowel motility Outcome: Progressing   Problem: Pain Managment: Goal: General experience of comfort will improve and/or be controlled Outcome: Progressing   Problem: Skin Integrity: Goal: Risk for impaired skin integrity will decrease Outcome: Progressing   Problem: Activity: Goal: Capacity to carry out activities will improve Outcome: Progressing

## 2024-02-27 NOTE — Interval H&P Note (Signed)
 History and Physical Interval Note:  02/27/2024 1:25 PM  Abigail Sharp  has presented today for surgery, with the diagnosis of aflutter.  The various methods of treatment have been discussed with the patient and family. After consideration of risks, benefits and other options for treatment, the patient has consented to  Procedure(s): CARDIOVERSION (N/A) as a surgical intervention.  The patient's history has been reviewed, patient examined, no change in status, stable for surgery.  I have reviewed the patient's chart and labs.  Questions were answered to the patient's satisfaction.     Kammi Hechler [employer]

## 2024-02-27 NOTE — H&P (View-Only) (Signed)
 Advanced Heart Failure Rounding Note  Cardiologist: Madonna Large, DO  Chief Complaint: Acute systolic heart failure Subjective:    -3.9L yesterday with 80 IV x1 (PM dose held) + diamox  BID  CVP 3. Co-ox 65%  SCr 1.61>1.96  Feels ok this morning just tired. Denies CP/SOB.   Objective:   Weight Range: 120.9 kg Body mass index is 45.75 kg/m.   Vital Signs:   Temp:  [97.6 F (36.4 C)-98 F (36.7 C)] 97.7 F (36.5 C) (09/26 0744) Pulse Rate:  [95-106] 101 (09/26 0744) Resp:  [14-20] 20 (09/26 0645) BP: (90-124)/(48-87) 110/56 (09/26 0744) SpO2:  [96 %-100 %] 96 % (09/26 0744) Weight:  [120.9 kg] 120.9 kg (09/26 0346) Last BM Date : 02/26/24  Weight change: Filed Weights   02/24/24 1510 02/26/24 0555 02/27/24 0346  Weight: 125.5 kg 123.2 kg 120.9 kg   Intake/Output:   Intake/Output Summary (Last 24 hours) at 02/27/2024 0836 Last data filed at 02/27/2024 0600 Gross per 24 hour  Intake 619.89 ml  Output 3100 ml  Net -2480.11 ml    Physical Exam    General:  obese appearing.  No respiratory difficulty Neck: JVD ~5 cm.  Cor: Regular rate & irregular rhythm. 2/6 MR murmur. Lungs: clear Extremities: no edema  Neuro: alert & oriented x 3. Affect pleasant.   Telemetry   AFL 90s (Personally reviewed)    Labs    CBC Recent Labs    02/26/24 0905 02/27/24 0425  WBC 5.5 6.3  HGB 13.5 13.8  HCT 42.6 43.6  MCV 77.9* 78.0*  PLT 214 232   Basic Metabolic Panel Recent Labs    90/74/74 0905 02/27/24 0425  NA 136 137  K 4.3 4.0  CL 101 101  CO2 24 24  GLUCOSE 85 107*  BUN 43* 55*  CREATININE 1.61* 1.96*  CALCIUM  9.0 8.8*   Liver Function Tests No results for input(s): AST, ALT, ALKPHOS, BILITOT, PROT, ALBUMIN in the last 72 hours. No results for input(s): LIPASE, AMYLASE in the last 72 hours. Cardiac Enzymes No results for input(s): CKTOTAL, CKMB, CKMBINDEX, TROPONINI in the last 72 hours.  BNP: BNP (last 3 results) Recent  Labs    01/02/24 1504 02/17/24 1610  BNP 119.1* 856.6*    ProBNP (last 3 results) No results for input(s): PROBNP in the last 8760 hours.   D-Dimer No results for input(s): DDIMER in the last 72 hours. Hemoglobin A1C No results for input(s): HGBA1C in the last 72 hours. Fasting Lipid Panel No results for input(s): CHOL, HDL, LDLCALC, TRIG, CHOLHDL, LDLDIRECT in the last 72 hours. Thyroid Function Tests No results for input(s): TSH, T4TOTAL, T3FREE, THYROIDAB in the last 72 hours.  Invalid input(s): FREET3  Other results:   Imaging    DG Chest Port 1 View Result Date: 02/26/2024 EXAM: 1 VIEW(S) XRAY OF THE CHEST 02/26/2024 12:03:00 PM COMPARISON: 02/17/2024 CLINICAL HISTORY: S/P PICC central line placement FINDINGS: LINES, TUBES AND DEVICES: Right PICC in place with tip at superior cavoatrial junction. LUNGS AND PLEURA: New platelet atelectasis in the left mid lung. No pulmonary edema. No pleural effusion. No pneumothorax. HEART AND MEDIASTINUM: No acute abnormality of the cardiac and mediastinal silhouettes. BONES AND SOFT TISSUES: No acute osseous abnormality. IMPRESSION: 1. Right upper extremity PICC line with tip at the superior cavoatrial junction, appropriately positioned. 2. New plate-like atelectasis in the left mid lung. Electronically signed by: Waddell Calk MD 02/26/2024 12:19 PM EDT RP Workstation: [id number]     Medications:  Scheduled Medications:  amiodarone   400 mg Oral BID   apixaban   5 mg Oral BID   ARIPiprazole   5 mg Oral Daily   atorvastatin   40 mg Oral Daily   busPIRone   10 mg Oral BID   Chlorhexidine  Gluconate Cloth  6 each Topical Daily   digoxin   0.125 mg Oral Daily   fluticasone   2 spray Each Nare Daily   lamoTRIgine   25 mg Oral BID   sertraline   50 mg Oral Daily   sodium chloride  flush  10-40 mL Intracatheter Q12H   sodium chloride  flush  10-40 mL Intracatheter Q12H   sodium chloride  flush  3 mL Intravenous Q12H    sodium chloride  flush  3 mL Intravenous Q12H    Infusions:    PRN Medications: acetaminophen , alum & mag hydroxide-simeth, sodium chloride , sodium chloride  flush, sodium chloride  flush, sodium chloride  flush    Patient Profile   58 y/o female w/ prior h/o NICM w/ improved EF, hypertension, obesity and CKD III, admitted w/ acute CHF w/ low output. Found to have severe MR on w/u. Hospital course further c/b new AFL.   Assessment/Plan   1. Acute Systolic Heart Failure w/ Low Output - 2D Echo this admit EF 40-45%. EF 35-40% on TEE w/ RV mod reduced - NICM. Has CAD but not enough to explain CM  - RHC w/ elevated Biv filling pressures and low CO in setting of severe MR (RA 15, PA 52/28 (38), PCW 38, PA sat 52%, CO 3.82, CI 1.7, PAPi 1.6). IABP placement not ideal given tachyarrhythmia. She diuresed ok w/o need for inotropic support - Hold further diuresis at this time. With SCr rise and CVP 3. Plan to start PO diuretics tomorrow vs Sunday - Off ? blocker w/ low output  - Hold Entresto   - Continue digoxin  0.125 mg daily. Check level tomorrow, suspect Scr will come down.    2. Severe MR - suspect functional, severe LAE. Further c/b AFL  - needs HF + AFL optimization  - continue to push diuresis and afterload reduction per above - needs attempt at DCCV once better diuresed - will need reassessment of MV once HF/AFL optimized. If still severe, will need assessment for mTEER    4. AFL - current rates in the 90s - off ? blocker given low output HF - Continue amiodarone  400 mg BID. Decrease to 200 mg BID 9/29 pm - TEE this admit showed no LA thrombus - Continue eliquis  5 mg BID - LA diameter 4.6 cm. May not be good candidate for AFL ablation given obesity/BMI  - Needs outpatient assessment for OSA  - Plan for DCCV this afternoon.   Informed Consent   Shared Decision Making/Informed Consent The risks (stroke, cardiac arrhythmias rarely resulting in the need for a temporary or  permanent pacemaker, skin irritation or burns and complications associated with conscious sedation including aspiration, arrhythmia, respiratory failure and death), benefits (restoration of normal sinus rhythm) and alternatives of a direct current cardioversion were explained in detail to Ms. Beckstrom and she agrees to proceed.         5. CAD - LHC 02/25/24 w/ mild nonobstructive CAD - medical management  - no ASA given need for a/c - Continue statin. LDL 92 9/25   6. AKI on CKD III - SCr 1.8 on admit - 1.4>1.6>1.9 with diureses - low-out on RHC but diuresed well w/ IV Lasix   - Hold further diuretics - monitor UOP and SCr closely   Length of Stay:  9  Beckey LITTIE Coe, NP  02/27/2024, 8:36 AM  Advanced Heart Failure Team Pager [phone] (M-F; 7a - 5p)  Please contact [facility] for night-coverage after hours (5p -7a ) and weekends on [email or web address]

## 2024-02-27 NOTE — Transfer of Care (Signed)
 Immediate Anesthesia Transfer of Care Note  Patient: Abigail Sharp  Procedure(s) Performed: CARDIOVERSION  Patient Location: Cath Lab  Anesthesia Type:General  Level of Consciousness: awake, alert , oriented, and patient cooperative  Airway & Oxygen Therapy: Patient Spontanous Breathing and Patient connected to nasal cannula oxygen  Post-op Assessment: Report given to RN and Post -op Vital signs reviewed and stable  Post vital signs: Reviewed and stable  Last Vitals:  Vitals Value Taken Time  BP 117/62 02/27/24 13:34  Temp    Pulse 68 02/27/24 13:34  Resp 21 02/27/24 13:34  SpO2 96 % 02/27/24 13:34    Last Pain:  Vitals:   02/27/24 1334  TempSrc:   PainSc: 0-No pain      Patients Stated Pain Goal: 0 (02/25/24 2000)  Complications: No notable events documented.

## 2024-02-27 NOTE — Procedures (Signed)
 Electrical Cardioversion Procedure Note Abigail Sharp [id number] [birth year]  Procedure: Electrical Cardioversion Indications:  Atrial Flutter  Procedure Details Consent: Risks of procedure as well as the alternatives and risks of each were explained to the (patient/caregiver).  Consent for procedure obtained. Time Out: Verified patient identification, verified procedure, site/side was marked, verified correct patient position, special equipment/implants available, medications/allergies/relevent history reviewed, required imaging and test results available.  Performed  Patient placed on cardiac monitor, pulse oximetry, supplemental oxygen as necessary.  Sedation given: Propofol  per anesthesiology Pacer pads placed anterior and posterior chest.  Cardioverted 1 time(s).  Cardioverted at 200J.  Evaluation Findings: Post procedure EKG shows: NSR Complications: None Patient did tolerate procedure well.   Ezra Shuck 02/27/2024, 1:33 PM

## 2024-02-27 NOTE — Progress Notes (Signed)
 PT Cancellation Note  Patient Details Name: Abigail Sharp MRN: [id number] DOB: 05/13/1966   Cancelled Treatment:    Reason Eval/Treat Not Completed: (P) Patient at procedure or test/unavailable (Pt off unit - current location [facility] 04. Note plan for Cardioversion today.) Will continue efforts per PT plan of care as schedule permits.  Connell HERO Corvette Orser 02/27/2024, 12:43 PM

## 2024-02-27 NOTE — Anesthesia Preprocedure Evaluation (Addendum)
 Anesthesia Evaluation  Patient identified by MRN, date of birth, ID band Patient awake    Reviewed: Allergy & Precautions, NPO status , Patient's Chart, lab work & pertinent test results  Airway Mallampati: II  TM Distance: >3 FB Neck ROM: Full    Dental  (+) Poor Dentition, Chipped, Missing   Pulmonary neg pulmonary ROS   Pulmonary exam normal breath sounds clear to auscultation       Cardiovascular hypertension, pulmonary hypertension+CHF  Normal cardiovascular exam+ dysrhythmias Atrial Fibrillation + Valvular Problems/Murmurs (severe MR) MR  Rhythm:Irregular Rate:Normal  TTE 2025 1. Left ventricular ejection fraction, by estimation, is 35 to 40%. The  left ventricle has moderately decreased function. The left ventricle  demonstrates global hypokinesis. The left ventricular internal cavity size  was moderately dilated.   2. Right ventricular systolic function is moderately reduced. The right  ventricular size is mildly enlarged.   3. Left atrial size was moderately dilated. No left atrial/left atrial  appendage thrombus was detected.   4. Right atrial size was mildly dilated.   5. A small pericardial effusion is present.   6. The aortic valve is tricuspid. Aortic valve regurgitation is not  visualized. No aortic stenosis is present.   7. Evidence of atrial level shunting detected by color flow Doppler.  Agitated saline contrast bubble study was positive with shunting observed  within 3-6 cardiac cycles suggestive of interatrial shunt. There is a  small patent foramen ovale.   8. 3D performed of the mitral valve and demonstrates severe MR.   9. The mitral valve is abnormal. Severe mitral valve regurgitation. MR  appears functional. There are 2 central MR jets, with total 3D VCA  0.5cm^2; in addition, there is RLPV systolic flow reversal. This is  consistent with severe MR   Cath 2025  Mild two-vessel disease involving the  LAD and OM 2  Prox LAD to Mid LAD lesion is 50% stenosed with 50% stenosed side branch in 1st Diag. Dist LAD lesion is 40% stenosed. Dist Cx lesion is 30% stenosed. 2nd Mrg lesion is 40% stenosed.   Moderate pulmonary pretension with mean PAP 38 mmHg and PCWP 38 mmHg and LVEDP of 29 mmHg.  Prominent berry is noted, but not giant.     Neuro/Psych  PSYCHIATRIC DISORDERS Anxiety Depression    negative neurological ROS     GI/Hepatic negative GI ROS, Neg liver ROS,,,  Endo/Other    Class 3 obesity (BMI 46)  Renal/GU Renal InsufficiencyRenal disease  negative genitourinary   Musculoskeletal negative musculoskeletal ROS (+)    Abdominal   Peds  Hematology negative hematology ROS (+)   Anesthesia Other Findings   Reproductive/Obstetrics                              Anesthesia Physical Anesthesia Plan  ASA: 4  Anesthesia Plan: General   Post-op Pain Management:    Induction: Intravenous  PONV Risk Score and Plan: 3 and Propofol  infusion and Treatment may vary due to age or medical condition  Airway Management Planned: Natural Airway  Additional Equipment:   Intra-op Plan:   Post-operative Plan:   Informed Consent: I have reviewed the patients History and Physical, chart, labs and discussed the procedure including the risks, benefits and alternatives for the proposed anesthesia with the patient or authorized representative who has indicated his/her understanding and acceptance.     Dental advisory given  Plan Discussed with: CRNA  Anesthesia Plan  Comments:          Anesthesia Quick Evaluation

## 2024-02-28 DIAGNOSIS — I5031 Acute diastolic (congestive) heart failure: Secondary | ICD-10-CM | POA: Diagnosis not present

## 2024-02-28 LAB — BASIC METABOLIC PANEL WITH GFR
Anion gap: 13 (ref 5–15)
BUN: 60 mg/dL — ABNORMAL HIGH (ref 6–20)
CO2: 23 mmol/L (ref 22–32)
Calcium: 8.9 mg/dL (ref 8.9–10.3)
Chloride: 102 mmol/L (ref 98–111)
Creatinine, Ser: 2.14 mg/dL — ABNORMAL HIGH (ref 0.44–1.00)
GFR, Estimated: 26 mL/min — ABNORMAL LOW (ref >60)
Glucose, Bld: 106 mg/dL — ABNORMAL HIGH (ref 70–99)
Potassium: 4.2 mmol/L (ref 3.5–5.1)
Sodium: 138 mmol/L (ref 135–145)

## 2024-02-28 LAB — LIPOPROTEIN A (LPA): Lipoprotein (a): 9.7 nmol/L (ref <75.0)

## 2024-02-28 LAB — CBC
HCT: 42.3 % (ref 36.0–46.0)
Hemoglobin: 13.3 g/dL (ref 12.0–15.0)
MCH: 24.6 pg — ABNORMAL LOW (ref 26.0–34.0)
MCHC: 31.4 g/dL (ref 30.0–36.0)
MCV: 78.2 fL — ABNORMAL LOW (ref 80.0–100.0)
Platelets: 206 K/uL (ref 150–400)
RBC: 5.41 MIL/uL — ABNORMAL HIGH (ref 3.87–5.11)
RDW: 18.1 % — ABNORMAL HIGH (ref 11.5–15.5)
WBC: 6.3 K/uL (ref 4.0–10.5)
nRBC: 0 % (ref 0.0–0.2)

## 2024-02-28 LAB — COOXEMETRY PANEL
Carboxyhemoglobin: 1.7 % — ABNORMAL HIGH (ref 0.5–1.5)
Methemoglobin: <0.7 % (ref 0.0–1.5)
O2 Saturation: 73.2 %
Total hemoglobin: 13.7 g/dL (ref 12.0–16.0)

## 2024-02-28 LAB — DIGOXIN LEVEL: Digoxin Level: <0.4 ng/mL — ABNORMAL LOW (ref 0.8–2.0)

## 2024-02-28 NOTE — Progress Notes (Signed)
 Advanced Heart Failure Rounding Note  Cardiologist: Madonna Large, DO  Chief Complaint: Acute systolic heart failure Subjective:    -950 cc yesterday -- Lasix  held  CVP 3. Co-ox 73%  SCr 1.61>1.96>2.14  Feels ok this morning just tired. Denies CP/SOB.   Objective:   Weight Range: 121.7 kg Body mass index is 46.05 kg/m.   Vital Signs:   Temp:  [97.6 F (36.4 C)-98.5 F (36.9 C)] 97.6 F (36.4 C) (09/27 0300) Pulse Rate:  [68-104] 71 (09/27 0300) Resp:  [17-25] 20 (09/27 0300) BP: (96-133)/(60-77) 131/65 (09/27 0300) SpO2:  [92 %-100 %] 98 % (09/27 0300) Weight:  [121.7 kg] 121.7 kg (09/27 0300) Last BM Date : 02/26/24  Weight change: Filed Weights   02/26/24 0555 02/27/24 0346 02/28/24 0300  Weight: 123.2 kg 120.9 kg 121.7 kg   Intake/Output:   Intake/Output Summary (Last 24 hours) at 02/28/2024 0744 Last data filed at 02/28/2024 0303 Gross per 24 hour  Intake 120 ml  Output 1100 ml  Net -980 ml    Physical Exam    General:  obese appearing.  No respiratory difficulty Neck: I did not appreciate JVD with patient sitting upright.  Cor: Regular rate & irregular rhythm. 2/6 MR murmur. Lungs: clear Extremities: no edema  Neuro: alert & oriented x 3. Affect pleasant.   Telemetry   Sinus rhythm (Personally reviewed)    Labs    CBC Recent Labs    02/27/24 0425 02/28/24 0310  WBC 6.3 6.3  HGB 13.8 13.3  HCT 43.6 42.3  MCV 78.0* 78.2*  PLT 232 206   Basic Metabolic Panel Recent Labs    90/73/74 0425 02/28/24 0310  NA 137 138  K 4.0 4.2  CL 101 102  CO2 24 23  GLUCOSE 107* 106*  BUN 55* 60*  CREATININE 1.96* 2.14*  CALCIUM  8.8* 8.9   Liver Function Tests No results for input(s): AST, ALT, ALKPHOS, BILITOT, PROT, ALBUMIN in the last 72 hours. No results for input(s): LIPASE, AMYLASE in the last 72 hours. Cardiac Enzymes No results for input(s): CKTOTAL, CKMB, CKMBINDEX, TROPONINI in the last 72 hours.  BNP: BNP  (last 3 results) Recent Labs    01/02/24 1504 02/17/24 1610  BNP 119.1* 856.6*    ProBNP (last 3 results) No results for input(s): PROBNP in the last 8760 hours.   D-Dimer No results for input(s): DDIMER in the last 72 hours. Hemoglobin A1C No results for input(s): HGBA1C in the last 72 hours. Fasting Lipid Panel No results for input(s): CHOL, HDL, LDLCALC, TRIG, CHOLHDL, LDLDIRECT in the last 72 hours. Thyroid Function Tests No results for input(s): TSH, T4TOTAL, T3FREE, THYROIDAB in the last 72 hours.  Invalid input(s): FREET3  Other results:   Imaging    No results found.    Medications:     Scheduled Medications:  amiodarone   400 mg Oral BID   apixaban   5 mg Oral BID   ARIPiprazole   5 mg Oral Daily   atorvastatin   40 mg Oral Daily   busPIRone   10 mg Oral BID   Chlorhexidine  Gluconate Cloth  6 each Topical Daily   digoxin   0.125 mg Oral Daily   fluticasone   2 spray Each Nare Daily   lamoTRIgine   25 mg Oral BID   sertraline   50 mg Oral Daily   sodium chloride  flush  10-40 mL Intracatheter Q12H    Infusions:    PRN Medications: acetaminophen , alum & mag hydroxide-simeth, sodium chloride , sodium chloride  flush  Patient Profile   58 y/o female w/ prior h/o NICM w/ improved EF, hypertension, obesity and CKD III, admitted w/ acute CHF w/ low output. Found to have severe MR on w/u. Hospital course further c/b new AFL.   Assessment/Plan   1. Acute Systolic Heart Failure w/ Low Output - 2D Echo this admit EF 40-45%. EF 35-40% on TEE w/ RV mod reduced - NICM. Has CAD but not enough to explain CM  - RHC w/ elevated Biv filling pressures and low CO in setting of severe MR (RA 15, PA 52/28 (38), PCW 38, PA sat 52%, CO 3.82, CI 1.7, PAPi 1.6). IABP placement not ideal given tachyarrhythmia. She diuresed ok w/o need for inotropic support - Hold further diuresis at this time. With SCr rise and CVP 3. Plan to start PO diuretics  tomorrow  - Off ? blocker w/ low output  - Hold Entresto   - Continue digoxin  0.125 mg daily. Level < 0.4   2. Severe MR - suspect functional, severe LAE. Further c/b AFL  - needs HF + AFL optimization  - continue to push diuresis and afterload reduction per above - needs attempt at DCCV once better diuresed - will need reassessment of MV once HF/AFL optimized. If still severe, will need assessment for mTEER    4. AFL - current rates in the 90s - off ? blocker given low output HF - Continue amiodarone  400 mg BID. Decrease to 200 mg BID 9/29 pm - TEE this admit showed no LA thrombus - Continue eliquis  5 mg BID - LA diameter 4.6 cm.  - she would be a candidate for AFL ablation despite her obesity, but I suspect she would develop AF given her underlying pathology - Needs outpatient assessment for OSA         5. CAD - LHC 02/25/24 w/ mild nonobstructive CAD - medical management  - no ASA given need for a/c - Continue statin. LDL 92 9/25   6. AKI on CKD III - SCr 1.8 on admit - 1.4>1.6>1.9 with diureses, to 2.14 yesterday after lasix  held - low-out on RHC but diuresed well w/ IV Lasix   - Hold further diuretics - monitor UOP and SCr closely   Length of Stay: 10  Eulas FORBES Furbish, MD  02/28/2024, 7:44 AM  Advanced Heart Failure Team Pager [phone] (M-F; 7a - 5p)  Please contact [facility] for night-coverage after hours (5p -7a ) and weekends on [email or web address]

## 2024-02-28 NOTE — Plan of Care (Signed)

## 2024-02-28 NOTE — Progress Notes (Signed)
 Pt has  voided 150 cc, plus one small incontinent episode, bladder scan showed 200 cc in bladder

## 2024-02-28 NOTE — Progress Notes (Signed)
 Mobility Specialist Progress Note:    02/28/24 1247  Mobility  Activity Ambulated with assistance (in room)  Level of Assistance Contact guard assist, steadying assist  Assistive Device Other (Comment) (HHA)  Distance Ambulated (ft) 15 ft  Activity Response Tolerated fair  Mobility Referral Yes  Mobility visit 1 Mobility  Mobility Specialist Start Time (ACUTE ONLY) 1247  Mobility Specialist Stop Time (ACUTE ONLY) 1258  Mobility Specialist Time Calculation (min) (ACUTE ONLY) 11 min   Pt pleasant and agreeable to session. Pt stated they are feeling better and stronger everyday. Pt able to move and ambulate well. Pt c/o know BLE knee pain she believes is from arthritis. Returned pt to room w/o all needs met.  Venetia Keel Mobility Specialist Please Neurosurgeon or Rehab Office at [phone]

## 2024-02-28 NOTE — Progress Notes (Signed)
 Abigail Sharp  FMW:[id number] DOB: [birth year] DOA: 02/17/2024 PCP: de [place], Raymond J, MD    Brief Narrative:  58 year old with a history of HTN and CHF who presented to the ER 9/16 with chest pain and shortness of breath which had been steadily building for months.  She was found to have uncontrolled hypertension with presenting blood pressure 160/110.  EKG was nonacute.  Hemoglobin was 11.2.  BNP was elevated at 856.  Chest x-ray revealed vascular congestion.  Following her admission a TTE was accomplished which noted EF of 40-45% with moderate to severe MR.  She was noted to be in atrial fibrillation for which heparin  was initiated.  She was diuresed.  As she has stabilized she has been transitioned to a DOAC.  She underwent TEE 9/22 which noted EF 35-40% with moderately reduced RV systolic function and severe MR.  Left and right heart cath 9/24 noted nonobstructive coronary artery disease and significantly elevated right and left heart filling pressures, which led to an advanced heart failure team consultation.  She underwent successful DCCV 9/26.  Goals of Care:   Code Status: Full Code   DVT prophylaxis: apixaban  (ELIQUIS ) tablet 5 mg   Interim Hx: The patient is sitting up in a bedside chair having just given herself a sponge bath. She is in good spirits, and denies cp or sob. She tells me she feels weak in general.   Assessment & Plan:  Acute systolic congestive heart failure exacerbation Care per Advanced Heart Failure Team - EF 35-40% via TEE 02/23/2024 - nonischemic - RHC noted elevated biventricular filling pressures and low cardiac output in setting of severe MR - off beta-blocker due to low output - diuretic on hold at present due to climbing creatinine - continue digoxin   Severe MR Care per Cardiology - will need reassessment once heart failure optimized to determine if intervention appropriate  Newly appreciated atrial flutter/atrial fibrillation TSH normal -amiodarone ,  digoxin , and Eliquis  per cardiology - s/p successful DCCV 9/26 - maintaining NSR thus far   CAD LHC 02/25/2024 noted mild nonobstructive coronary artery disease with recommendation being for medical management only - continue statin - no aspirin  as patient on DOAC  AKI on CKD stage IIIb Baseline creatinine for past month has been approximately 1.5 - diuretic on hold presently due to climbing creatinine - cont to monitor trend   Recent Labs  Lab 02/24/24 0313 02/25/24 0729 02/26/24 0905 02/27/24 0425 02/28/24 0310  CREATININE 1.51* 1.40* 1.61* 1.96* 2.14*     Prolonged QTc Celexa  dose decreased during this admission  Anxiety/depression/OCD Noncompliant with medications at home -followed by psychiatry   Morbid obesity - Body mass index is 46.05 kg/m.   Family Communication: No family present at time of exam Disposition:   Home Health Pt (Pending Progress With Upcoming Procedures)02/26/2024 1600  Objective: Blood pressure 121/63, pulse 74, temperature 98.4 F (36.9 C), temperature source Oral, resp. rate 15, height 5' 4 (1.626 m), weight 121.7 kg, last menstrual period 03/29/2016, SpO2 96%.  Intake/Output Summary (Last 24 hours) at 02/28/2024 0850 Last data filed at 02/28/2024 0303 Gross per 24 hour  Intake 120 ml  Output 1100 ml  Net -980 ml   Filed Weights   02/26/24 0555 02/27/24 0346 02/28/24 0300  Weight: 123.2 kg 120.9 kg 121.7 kg    Examination: General: No acute respiratory distress Lungs: Clear to auscultation bilaterally without wheezes or crackles Cardiovascular: Regular rate and rhythm without murmur gallop or rub normal S1 and S2 Abdomen: Nontender,  nondistended, soft, bowel sounds positive, no rebound, no ascites, no appreciable mass Extremities: trace B LE edema   CBC: Recent Labs  Lab 02/26/24 0905 02/27/24 0425 02/28/24 0310  WBC 5.5 6.3 6.3  HGB 13.5 13.8 13.3  HCT 42.6 43.6 42.3  MCV 77.9* 78.0* 78.2*  PLT 214 232 206   Basic Metabolic  Panel: Recent Labs  Lab 02/26/24 0905 02/27/24 0425 02/28/24 0310  NA 136 137 138  K 4.3 4.0 4.2  CL 101 101 102  CO2 24 24 23   GLUCOSE 85 107* 106*  BUN 43* 55* 60*  CREATININE 1.61* 1.96* 2.14*  CALCIUM  9.0 8.8* 8.9   GFR: Estimated Creatinine Clearance: 37.3 mL/min (A) (by C-G formula based on SCr of 2.14 mg/dL (H)).   Scheduled Meds:  amiodarone   400 mg Oral BID   apixaban   5 mg Oral BID   ARIPiprazole   5 mg Oral Daily   atorvastatin   40 mg Oral Daily   busPIRone   10 mg Oral BID   Chlorhexidine  Gluconate Cloth  6 each Topical Daily   digoxin   0.125 mg Oral Daily   fluticasone   2 spray Each Nare Daily   lamoTRIgine   25 mg Oral BID   sertraline   50 mg Oral Daily   sodium chloride  flush  10-40 mL Intracatheter Q12H      LOS: 10 days   Reyes IVAR Moores, MD [facility] Office  [phone] Pager - Text Page per Tracey  If 7PM-7AM, please contact night-coverage per Amion 02/28/2024, 8:50 AM

## 2024-02-29 ENCOUNTER — Encounter (HOSPITAL_COMMUNITY): Payer: Self-pay | Admitting: Cardiology

## 2024-02-29 DIAGNOSIS — I5031 Acute diastolic (congestive) heart failure: Secondary | ICD-10-CM | POA: Diagnosis not present

## 2024-02-29 LAB — BASIC METABOLIC PANEL WITH GFR
Anion gap: 11 (ref 5–15)
BUN: 55 mg/dL — ABNORMAL HIGH (ref 6–20)
CO2: 19 mmol/L — ABNORMAL LOW (ref 22–32)
Calcium: 8.7 mg/dL — ABNORMAL LOW (ref 8.9–10.3)
Chloride: 108 mmol/L (ref 98–111)
Creatinine, Ser: 2.28 mg/dL — ABNORMAL HIGH (ref 0.44–1.00)
GFR, Estimated: 24 mL/min — ABNORMAL LOW (ref >60)
Glucose, Bld: 88 mg/dL (ref 70–99)
Potassium: 4.1 mmol/L (ref 3.5–5.1)
Sodium: 138 mmol/L (ref 135–145)

## 2024-02-29 LAB — CBC
HCT: 39.7 % (ref 36.0–46.0)
Hemoglobin: 12.7 g/dL (ref 12.0–15.0)
MCH: 24.7 pg — ABNORMAL LOW (ref 26.0–34.0)
MCHC: 32 g/dL (ref 30.0–36.0)
MCV: 77.1 fL — ABNORMAL LOW (ref 80.0–100.0)
Platelets: 177 K/uL (ref 150–400)
RBC: 5.15 MIL/uL — ABNORMAL HIGH (ref 3.87–5.11)
RDW: 18 % — ABNORMAL HIGH (ref 11.5–15.5)
WBC: 6.6 K/uL (ref 4.0–10.5)
nRBC: 0 % (ref 0.0–0.2)

## 2024-02-29 LAB — COOXEMETRY PANEL
Carboxyhemoglobin: 2.1 % — ABNORMAL HIGH (ref 0.5–1.5)
Methemoglobin: <0.7 % (ref 0.0–1.5)
O2 Saturation: 70.8 %
Total hemoglobin: 13.1 g/dL (ref 12.0–16.0)

## 2024-02-29 NOTE — Progress Notes (Signed)
 Advanced Heart Failure Rounding Note  Cardiologist: Madonna Large, DO  Chief Complaint: Acute systolic heart failure Subjective:    -1.5 cc yesterday -- Lasix  held  CVP 3. Co-ox 70%  SCr 1.61>1.96 (CVP 3, diuretics held) >2.14>2.28  Feels ok this morning just tired. Denies CP/SOB. She reports good UOP but having little thirst and not tolerating ice water .  Objective:   Weight Range: 120.7 kg Body mass index is 45.68 kg/m.   Vital Signs:   Temp:  [97.7 F (36.5 C)-98.4 F (36.9 C)] 98 F (36.7 C) (09/28 0300) Pulse Rate:  [65-75] 66 (09/28 0300) Resp:  [13-20] 20 (09/28 0300) BP: (121-142)/(63-75) 142/72 (09/28 0300) SpO2:  [94 %-100 %] 100 % (09/28 0300) Weight:  [120.7 kg] 120.7 kg (09/28 0542) Last BM Date : 02/28/24  Weight change: Filed Weights   02/27/24 0346 02/28/24 0300 02/29/24 0542  Weight: 120.9 kg 121.7 kg 120.7 kg   Intake/Output:   Intake/Output Summary (Last 24 hours) at 02/29/2024 0827 Last data filed at 02/29/2024 0800 Gross per 24 hour  Intake 240 ml  Output 1950 ml  Net -1710 ml    Physical Exam    General:  obese appearing.  No respiratory difficulty Neck: I did not appreciate JVD with patient sitting upright.  Cor: Regular rate & irregular rhythm. 2/6 MR murmur. Lungs: clear Extremities: no edema  Neuro: alert & oriented x 3. Affect pleasant.   Telemetry   Sinus rhythm (Personally reviewed)    Labs    CBC Recent Labs    02/28/24 0310 02/29/24 0241  WBC 6.3 6.6  HGB 13.3 12.7  HCT 42.3 39.7  MCV 78.2* 77.1*  PLT 206 177   Basic Metabolic Panel Recent Labs    90/72/74 0310 02/29/24 0241  NA 138 138  K 4.2 4.1  CL 102 108  CO2 23 19*  GLUCOSE 106* 88  BUN 60* 55*  CREATININE 2.14* 2.28*  CALCIUM  8.9 8.7*   Liver Function Tests No results for input(s): AST, ALT, ALKPHOS, BILITOT, PROT, ALBUMIN in the last 72 hours. No results for input(s): LIPASE, AMYLASE in the last 72 hours. Cardiac  Enzymes No results for input(s): CKTOTAL, CKMB, CKMBINDEX, TROPONINI in the last 72 hours.  BNP: BNP (last 3 results) Recent Labs    01/02/24 1504 02/17/24 1610  BNP 119.1* 856.6*    ProBNP (last 3 results) No results for input(s): PROBNP in the last 8760 hours.   D-Dimer No results for input(s): DDIMER in the last 72 hours. Hemoglobin A1C No results for input(s): HGBA1C in the last 72 hours. Fasting Lipid Panel No results for input(s): CHOL, HDL, LDLCALC, TRIG, CHOLHDL, LDLDIRECT in the last 72 hours. Thyroid Function Tests No results for input(s): TSH, T4TOTAL, T3FREE, THYROIDAB in the last 72 hours.  Invalid input(s): FREET3  Other results:   Imaging    No results found.    Medications:     Scheduled Medications:  amiodarone   400 mg Oral BID   apixaban   5 mg Oral BID   ARIPiprazole   5 mg Oral Daily   atorvastatin   40 mg Oral Daily   busPIRone   10 mg Oral BID   Chlorhexidine  Gluconate Cloth  6 each Topical Daily   digoxin   0.125 mg Oral Daily   fluticasone   2 spray Each Nare Daily   lamoTRIgine   25 mg Oral BID   sertraline   50 mg Oral Daily   sodium chloride  flush  10-40 mL Intracatheter Q12H  Infusions:    PRN Medications: acetaminophen , alum & mag hydroxide-simeth, sodium chloride , sodium chloride  flush    Patient Profile   58 y/o female w/ prior h/o NICM w/ improved EF, hypertension, obesity and CKD III, admitted w/ acute CHF w/ low output. Found to have severe MR on w/u. Hospital course further c/b new AFL.   Assessment/Plan   1. Acute Systolic Heart Failure w/ Low Output - 2D Echo this admit EF 40-45%. EF 35-40% on TEE w/ RV mod reduced - NICM. Has CAD but not enough to explain CM  - RHC w/ elevated Biv filling pressures and low CO in setting of severe MR (RA 15, PA 52/28 (38), PCW 38, PA sat 52%, CO 3.82, CI 1.7, PAPi 1.6). IABP placement not ideal given tachyarrhythmia. She diuresed ok w/o need for  inotropic support - Hold further diuresis at this time. With SCr rise and CVP 3. She is O>I off diuretics -- perhaps sinus rhythm helping with UOP. I encouraged fluid intake today - Off ? blocker w/ low output  - Hold Entresto   - Continue digoxin  0.125 mg daily. Level < 0.4   2. Severe MR - suspect functional, severe LAE. Further c/b AFL  - needs HF + AFL optimization  - will need reassessment of MV once HF/AFL optimized. If still severe, will need assessment for mTEER    4. AFL - current rates in the 90s - off ? blocker given low output HF - Continue amiodarone  400 mg BID. Decrease to 200 mg BID 9/29 pm - TEE this admit showed no LA thrombus - Continue eliquis  5 mg BID - LA diameter 4.6 cm.  - she would be a candidate for AFL ablation despite her obesity - her pannus is retractable, but I suspect she would develop AF given her underlying pathology - Needs outpatient assessment for OSA         5. CAD - LHC 02/25/24 w/ mild nonobstructive CAD - medical management  - no ASA given need for a/c - Continue statin. LDL 92 9/25   6. AKI on CKD III - SCr 1.8 on admit - 1.4>1.6>1.9 with diureses, after lasix  held 2.14 > 2.28; he continues to autodiurese, weight trending down. May need to liberalize fluid some today  - low-out on RHC but diuresed well w/ IV Lasix   - Hold further diuretics - monitor UOP and SCr closely   Length of Stay: 11  Eulas FORBES Furbish, MD  02/29/2024, 8:27 AM  Advanced Heart Failure Team Pager [phone] (M-F; 7a - 5p)  Please contact [facility] for night-coverage after hours (5p -7a ) and weekends on [email or web address]

## 2024-02-29 NOTE — Plan of Care (Signed)

## 2024-02-29 NOTE — Progress Notes (Signed)
 Mobility Specialist Progress Note:    02/29/24 1208  Mobility  Activity Ambulated with assistance (to bathroom)  Level of Assistance Standby assist, set-up cues, supervision of patient - no hands on  Assistive Device None  Distance Ambulated (ft) 15 ft  Activity Response Tolerated well  Mobility Referral Yes  Mobility visit 1 Mobility  Mobility Specialist Start Time (ACUTE ONLY) 1208  Mobility Specialist Stop Time (ACUTE ONLY) 1215  Mobility Specialist Time Calculation (min) (ACUTE ONLY) 7 min   Pt pleasant and agreeable to session. Pt needing to ambulate to restroom. Pt able to move and ambulate well stating she is feeling better everyday. Returned pt to bed w/ all needs met.   Venetia Keel Mobility Specialist Please Neurosurgeon or Rehab Office at [phone]

## 2024-02-29 NOTE — Progress Notes (Signed)
 Abigail Sharp  FMW:[id number] DOB: May 20, 1966 DOA: 02/17/2024 PCP: de [place], Raymond J, MD    Brief Narrative:  58 year old with a history of HTN and CHF who presented to the ER 9/16 with chest pain and shortness of breath which had been steadily building for months.  She was found to have uncontrolled hypertension with presenting blood pressure 160/110.  EKG was nonacute.  Hemoglobin was 11.2.  BNP was elevated at 856.  Chest x-ray revealed vascular congestion.  Following her admission a TTE was accomplished which noted EF of 40-45% with moderate to severe MR.  She was noted to be in atrial fibrillation for which heparin  was initiated.  She was diuresed.  As she has stabilized she has been transitioned to a DOAC.  She underwent TEE 9/22 which noted EF 35-40% with moderately reduced RV systolic function and severe MR.  Left and right heart cath 9/24 noted nonobstructive coronary artery disease and significantly elevated right and left heart filling pressures, which led to an advanced heart failure team consultation.  She underwent successful DCCV 9/26.  Goals of Care:   Code Status: Full Code   DVT prophylaxis: apixaban  (ELIQUIS ) tablet 5 mg   Interim Hx: No acute events recorded overnight.  Afebrile.  Heart rate remains controlled and in NSR.  Vital signs stable.  Creatinine has climbed slightly again overnight.  Sitting on the side of her bed.  No new complaints today.  Breathing comfortably.  Assessment & Plan:  Acute systolic congestive heart failure exacerbation Care per Advanced Heart Failure Team - EF 35-40% via TEE 02/23/2024 - nonischemic - RHC noted elevated biventricular filling pressures and low cardiac output in setting of severe MR - off beta-blocker due to low output - diuretic on hold at present due to climbing creatinine - continue digoxin   Severe MR Care per Cardiology - will need reassessment once heart failure optimized to determine if intervention appropriate  Newly  appreciated atrial flutter/atrial fibrillation TSH normal - amiodarone , digoxin , and Eliquis  per Cardiology - s/p successful DCCV 9/26 - maintaining NSR thus far   CAD LHC 02/25/2024 noted mild nonobstructive coronary artery disease with recommendation being for medical management only - continue statin - no aspirin  as patient on DOAC  AKI on CKD stage IIIb Baseline creatinine for past month has been approximately 1.5 - diuretic on hold presently due to climbing creatinine - cont to monitor trend   Recent Labs  Lab 02/25/24 0729 02/26/24 0905 02/27/24 0425 02/28/24 0310 02/29/24 0241  CREATININE 1.40* 1.61* 1.96* 2.14* 2.28*     Prolonged QTc Celexa  dose decreased during this admission  Anxiety/depression/OCD Noncompliant with medications at home -followed by psychiatry   Morbid obesity - Body mass index is 45.68 kg/m.   Family Communication: No family present at time of exam Disposition:   Home Health Pt (Pending Progress With Upcoming Procedures)02/26/2024 1600  Objective: Blood pressure 127/84, pulse 98, temperature 98.6 F (37 C), temperature source Oral, resp. rate 19, height 5' 4 (1.626 m), weight 120.7 kg, last menstrual period 03/29/2016, SpO2 100%.  Intake/Output Summary (Last 24 hours) at 02/29/2024 0924 Last data filed at 02/29/2024 0800 Gross per 24 hour  Intake 240 ml  Output 1950 ml  Net -1710 ml   Filed Weights   02/27/24 0346 02/28/24 0300 02/29/24 0542  Weight: 120.9 kg 121.7 kg 120.7 kg    Examination: General: No acute respiratory distress -alert and conversant Lungs: Poor breath sounds in bilateral bases but good air movement throughout other fields  Cardiovascular: Regular rate and rhythm  Abdomen: Obese, soft, NT/ND, BS positive Extremities: trace B LE edema   CBC: Recent Labs  Lab 02/27/24 0425 02/28/24 0310 02/29/24 0241  WBC 6.3 6.3 6.6  HGB 13.8 13.3 12.7  HCT 43.6 42.3 39.7  MCV 78.0* 78.2* 77.1*  PLT 232 206 177   Basic  Metabolic Panel: Recent Labs  Lab 02/27/24 0425 02/28/24 0310 02/29/24 0241  NA 137 138 138  K 4.0 4.2 4.1  CL 101 102 108  CO2 24 23 19*  GLUCOSE 107* 106* 88  BUN 55* 60* 55*  CREATININE 1.96* 2.14* 2.28*  CALCIUM  8.8* 8.9 8.7*   GFR: Estimated Creatinine Clearance: 34.9 mL/min (A) (by C-G formula based on SCr of 2.28 mg/dL (H)).   Scheduled Meds:  amiodarone   400 mg Oral BID   apixaban   5 mg Oral BID   ARIPiprazole   5 mg Oral Daily   atorvastatin   40 mg Oral Daily   busPIRone   10 mg Oral BID   Chlorhexidine  Gluconate Cloth  6 each Topical Daily   digoxin   0.125 mg Oral Daily   fluticasone   2 spray Each Nare Daily   lamoTRIgine   25 mg Oral BID   sertraline   50 mg Oral Daily   sodium chloride  flush  10-40 mL Intracatheter Q12H      LOS: 11 days   Reyes IVAR Moores, MD [facility] Office  [phone] Pager - Text Page per Tracey  If 7PM-7AM, please contact night-coverage per Amion 02/29/2024, 9:24 AM

## 2024-03-01 ENCOUNTER — Other Ambulatory Visit (HOSPITAL_COMMUNITY): Payer: Self-pay

## 2024-03-01 ENCOUNTER — Telehealth (HOSPITAL_COMMUNITY): Payer: Self-pay | Admitting: Pharmacy Technician

## 2024-03-01 ENCOUNTER — Inpatient Hospital Stay (HOSPITAL_COMMUNITY): Payer: MEDICAID

## 2024-03-01 DIAGNOSIS — I5031 Acute diastolic (congestive) heart failure: Secondary | ICD-10-CM | POA: Diagnosis not present

## 2024-03-01 LAB — COOXEMETRY PANEL
Carboxyhemoglobin: 1.4 % (ref 0.5–1.5)
Methemoglobin: <0.7 % (ref 0.0–1.5)
O2 Saturation: 72 %
Total hemoglobin: 12.4 g/dL (ref 12.0–16.0)

## 2024-03-01 LAB — BASIC METABOLIC PANEL WITH GFR
Anion gap: 8 (ref 5–15)
BUN: 53 mg/dL — ABNORMAL HIGH (ref 6–20)
CO2: 21 mmol/L — ABNORMAL LOW (ref 22–32)
Calcium: 8.7 mg/dL — ABNORMAL LOW (ref 8.9–10.3)
Chloride: 109 mmol/L (ref 98–111)
Creatinine, Ser: 1.99 mg/dL — ABNORMAL HIGH (ref 0.44–1.00)
GFR, Estimated: 29 mL/min — ABNORMAL LOW (ref >60)
Glucose, Bld: 89 mg/dL (ref 70–99)
Potassium: 4.2 mmol/L (ref 3.5–5.1)
Sodium: 138 mmol/L (ref 135–145)

## 2024-03-01 MED ORDER — DAPAGLIFLOZIN PROPANEDIOL 10 MG PO TABS
10.0000 mg | ORAL_TABLET | Freq: Every day | ORAL | Status: DC
  Administered 2024-03-01 – 2024-03-03 (×3): 10 mg via ORAL
  Filled 2024-03-01 (×3): qty 1

## 2024-03-01 MED ORDER — AMIODARONE HCL 200 MG PO TABS
200.0000 mg | ORAL_TABLET | Freq: Two times a day (BID) | ORAL | Status: DC
Start: 2024-03-02 — End: 2024-03-03
  Administered 2024-03-02 – 2024-03-03 (×3): 200 mg via ORAL
  Filled 2024-03-01 (×3): qty 1

## 2024-03-01 NOTE — Plan of Care (Signed)

## 2024-03-01 NOTE — Telephone Encounter (Signed)
 Patient Product/process development scientist completed.    The patient is insured through [employer] [place].     Ran test claim for amiodarone  200 mg and the current 30 day co-pay is $4.00.  Ran test claim for Eliquis  5 mg and the current 30 day co-pay is $4.00.  Ran test claim for aripiprazole  5 mg and the current 30 day co-pay is $4.00.  Ran test claim for buspirone  10 mg and the current 30 day co-pay is $4.00.  This test claim was processed through [facility]- copay amounts may vary at other pharmacies due to pharmacy/plan contracts, or as the patient moves through the different stages of their insurance plan.     Reyes Sharps, CPHT Pharmacy Technician III Certified Patient Advocate [facility] Patient Advocate Team Direct Number: [phone]  Fax: [phone]

## 2024-03-01 NOTE — Anesthesia Postprocedure Evaluation (Signed)
 Anesthesia Post Note  Patient: Norman Bier  Procedure(s) Performed: CARDIOVERSION     Patient location during evaluation: Cath Lab Anesthesia Type: General Level of consciousness: awake and alert Pain management: pain level controlled Vital Signs Assessment: post-procedure vital signs reviewed and stable Respiratory status: spontaneous breathing, nonlabored ventilation, respiratory function stable and patient connected to nasal cannula oxygen Cardiovascular status: blood pressure returned to baseline and stable Postop Assessment: no apparent nausea or vomiting Anesthetic complications: no   No notable events documented.  Last Vitals:  Vitals:   03/01/24 0618 03/01/24 0735  BP:  131/62  Pulse:  64  Resp: (!) 22 17  Temp:  36.4 C  SpO2:  98%    Last Pain:  Vitals:   03/01/24 0735  TempSrc: Oral  PainSc: 0-No pain   Pain Goal: Patients Stated Pain Goal: 0 (02/25/24 2000)  LLE Motor Response: Purposeful movement (03/01/24 0744) LLE Sensation: Full sensation (03/01/24 0744) RLE Motor Response: Purposeful movement (03/01/24 0744) RLE Sensation: Full sensation (03/01/24 0744)        Marien L Beila Purdie

## 2024-03-01 NOTE — Progress Notes (Addendum)
 Abigail Sharp  FMW:[id number] DOB: November 17, 1965 DOA: 02/17/2024 PCP: de [place], Raymond J, MD    Brief Narrative:  58 year old with a history of HTN and CHF who presented to the ER 9/16 with chest pain and shortness of breath which had been steadily building for months.  She was found to have uncontrolled hypertension with presenting blood pressure 160/110.  EKG was nonacute.  Hemoglobin was 11.2.  BNP was elevated at 856.  Chest x-ray revealed vascular congestion.  Following her admission a TTE was accomplished which noted EF of 40-45% with moderate to severe MR.  She was noted to be in atrial fibrillation for which heparin  was initiated.  She was diuresed.  As she has stabilized she has been transitioned to a DOAC.  She underwent TEE 9/22 which noted EF 35-40% with moderately reduced RV systolic function and severe MR.  Left and right heart cath 9/24 noted nonobstructive coronary artery disease and significantly elevated right and left heart filling pressures, which led to an advanced heart failure team consultation.  She underwent successful DCCV 9/26.  Goals of Care:   Code Status: Full Code   DVT prophylaxis: apixaban  (ELIQUIS ) tablet 5 mg   Interim Hx: Ambulating about the room and to the bathroom without difficulty.  Afebrile.  Vital signs stable.  Resting comfortably in bed at the time of my visit.  Denies chest pain shortness of breath fevers or chills.   Assessment & Plan:  Acute systolic congestive heart failure exacerbation Care per Advanced Heart Failure Team - EF 35-40% via TEE 02/23/2024 - nonischemic - RHC noted elevated biventricular filling pressures and low cardiac output in setting of severe MR - off beta-blocker due to low output - diuretic on hold at present due to climbing creatinine, with creatinine appearing to stabilize this morning - continue digoxin  - she is net negative approximately 24 L since admission -Farxiga initiated today  Filed Weights   02/28/24 0300 02/29/24  0542 03/01/24 0618  Weight: 121.7 kg 120.7 kg 122.5 kg     Severe MR Care per Cardiology -cardiology planning for limited follow-up echo  Newly appreciated persistent atrial flutter/atrial fibrillation POA TSH normal - amiodarone , digoxin , and Eliquis  per Cardiology - s/p successful DCCV 9/26 - maintaining NSR thus far   CAD LHC 02/25/2024 noted mild nonobstructive coronary artery disease with recommendation being for medical management only - continue statin - no aspirin  as patient on DOAC  AKI on CKD stage IIIb Baseline creatinine for past month has been approximately 1.5 - diuretic on hold presently due to climbing creatinine -at this time it appears that the patient's creatinine has stabilized  Recent Labs  Lab 02/26/24 0905 02/27/24 0425 02/28/24 0310 02/29/24 0241 03/01/24 0310  CREATININE 1.61* 1.96* 2.14* 2.28* 1.99*     Prolonged QTc Celexa  dose decreased during this admission  Anxiety/depression/OCD Noncompliant with medications at home - followed by Psychiatry as an outpatient  Morbid obesity - Body mass index is 46.36 kg/m.   Family Communication: No family present at time of exam Disposition:   Home Health Pt (Pending Progress With Upcoming Procedures)02/26/2024 1600  Objective: Blood pressure 131/62, pulse 64, temperature 97.6 F (36.4 C), temperature source Oral, resp. rate 17, height 5' 4 (1.626 m), weight 122.5 kg, last menstrual period 03/29/2016, SpO2 98%.  Intake/Output Summary (Last 24 hours) at 03/01/2024 0841 Last data filed at 03/01/2024 0800 Gross per 24 hour  Intake 320 ml  Output 2200 ml  Net -1880 ml   [employer]  02/28/24 0300 02/29/24 0542 03/01/24 0618  Weight: 121.7 kg 120.7 kg 122.5 kg    Examination: General: No acute respiratory distress -alert and conversant Lungs: Good breath sounds throughout without wheezing or crackles Cardiovascular: Regular rate and rhythm  Abdomen: Obese, soft, NT/ND, BS positive Extremities:  trace B LE edema without change  CBC: Recent Labs  Lab 02/27/24 0425 02/28/24 0310 02/29/24 0241  WBC 6.3 6.3 6.6  HGB 13.8 13.3 12.7  HCT 43.6 42.3 39.7  MCV 78.0* 78.2* 77.1*  PLT 232 206 177   Basic Metabolic Panel: Recent Labs  Lab 02/28/24 0310 02/29/24 0241 03/01/24 0310  NA 138 138 138  K 4.2 4.1 4.2  CL 102 108 109  CO2 23 19* 21*  GLUCOSE 106* 88 89  BUN 60* 55* 53*  CREATININE 2.14* 2.28* 1.99*  CALCIUM  8.9 8.7* 8.7*   GFR: Estimated Creatinine Clearance: 40.3 mL/min (A) (by C-G formula based on SCr of 1.99 mg/dL (H)).   Scheduled Meds:  [START ON 03/02/2024] amiodarone   200 mg Oral BID   amiodarone   400 mg Oral BID   apixaban   5 mg Oral BID   ARIPiprazole   5 mg Oral Daily   atorvastatin   40 mg Oral Daily   busPIRone   10 mg Oral BID   Chlorhexidine  Gluconate Cloth  6 each Topical Daily   digoxin   0.125 mg Oral Daily   fluticasone   2 spray Each Nare Daily   lamoTRIgine   25 mg Oral BID   sertraline   50 mg Oral Daily   sodium chloride  flush  10-40 mL Intracatheter Q12H      LOS: 12 days   Reyes IVAR Moores, MD [facility] Office  [phone] Pager - Text Page per Tracey  If 7PM-7AM, please contact night-coverage per Amion 03/01/2024, 8:41 AM

## 2024-03-01 NOTE — Progress Notes (Addendum)
 Advanced Heart Failure Rounding Note  Cardiologist: Madonna Large, DO  Chief Complaint: Acute systolic heart failure Subjective:    9/26 S/p DCCV>>NSR. Maintaining NSR.    Developed AKI over the weekend, Scr 1.40>>1.61>>1.96>>2.14>>2.28. Diuretics held but has been auto diuresing. SCr trending back down today, 1.99.    Co-ox ok at 72%, CVP low < 5.   Overall feels better. Breathing much improved. No chest pain.  Objective:   Weight Range: 122.5 kg Body mass index is 46.36 kg/m.   Vital Signs:   Temp:  [97.6 F (36.4 C)-98.6 F (37 C)] 97.6 F (36.4 C) (09/29 0735) Pulse Rate:  [64-98] 64 (09/29 0735) Resp:  [16-22] 17 (09/29 0735) BP: (117-139)/(55-84) 131/62 (09/29 0735) SpO2:  [98 %-100 %] 98 % (09/29 0735) Weight:  [122.5 kg] 122.5 kg (09/29 0618) Last BM Date : 02/29/24  Weight change: Filed Weights   02/28/24 0300 02/29/24 0542 03/01/24 0618  Weight: 121.7 kg 120.7 kg 122.5 kg   Intake/Output:   Intake/Output Summary (Last 24 hours) at 03/01/2024 0823 Last data filed at 03/01/2024 0305 Gross per 24 hour  Intake 436 ml  Output 1200 ml  Net -764 ml    Physical Exam   CVP < 5   General:  obese, NAD  Neck: JVD 5 cm   Cor: RRR. No audible murmur  Lungs: clear  Extremities: no LEE  Neuro: A&Ox 3. Affect pleasant   Telemetry   NSR 60s (Personally reviewed)    Labs    CBC Recent Labs    02/28/24 0310 02/29/24 0241  WBC 6.3 6.6  HGB 13.3 12.7  HCT 42.3 39.7  MCV 78.2* 77.1*  PLT 206 177   Basic Metabolic Panel Recent Labs    90/71/74 0241 03/01/24 0310  NA 138 138  K 4.1 4.2  CL 108 109  CO2 19* 21*  GLUCOSE 88 89  BUN 55* 53*  CREATININE 2.28* 1.99*  CALCIUM  8.7* 8.7*   Liver Function Tests No results for input(s): AST, ALT, ALKPHOS, BILITOT, PROT, ALBUMIN in the last 72 hours. No results for input(s): LIPASE, AMYLASE in the last 72 hours. Cardiac Enzymes No results for input(s): CKTOTAL, CKMB, CKMBINDEX,  TROPONINI in the last 72 hours.  BNP: BNP (last 3 results) Recent Labs    01/02/24 1504 02/17/24 1610  BNP 119.1* 856.6*    ProBNP (last 3 results) No results for input(s): PROBNP in the last 8760 hours.   D-Dimer No results for input(s): DDIMER in the last 72 hours. Hemoglobin A1C No results for input(s): HGBA1C in the last 72 hours. Fasting Lipid Panel No results for input(s): CHOL, HDL, LDLCALC, TRIG, CHOLHDL, LDLDIRECT in the last 72 hours. Thyroid Function Tests No results for input(s): TSH, T4TOTAL, T3FREE, THYROIDAB in the last 72 hours.  Invalid input(s): FREET3  Other results:   Imaging    No results found.    Medications:     Scheduled Medications:  amiodarone   400 mg Oral BID   apixaban   5 mg Oral BID   ARIPiprazole   5 mg Oral Daily   atorvastatin   40 mg Oral Daily   busPIRone   10 mg Oral BID   Chlorhexidine  Gluconate Cloth  6 each Topical Daily   digoxin   0.125 mg Oral Daily   fluticasone   2 spray Each Nare Daily   lamoTRIgine   25 mg Oral BID   sertraline   50 mg Oral Daily   sodium chloride  flush  10-40 mL Intracatheter Q12H  Infusions:    PRN Medications: acetaminophen , alum & mag hydroxide-simeth, sodium chloride , sodium chloride  flush    Patient Profile   58 y/o female w/ prior h/o NICM w/ improved EF, hypertension, obesity and CKD III, admitted w/ acute CHF w/ low output. Found to have severe MR on w/u. Hospital course further c/b new AFL.   Assessment/Plan   1. Acute Systolic Heart Failure w/ Low Output - 2D Echo this admit EF 40-45%. EF 35-40% on TEE w/ RV mod reduced - NICM. Has CAD but not enough to explain CM  - RHC w/ elevated Biv filling pressures and low CO in setting of severe MR (RA 15, PA 52/28 (38), PCW 38, PA sat 52%, CO 3.82, CI 1.7, PAPi 1.6). IABP placement not ideal given tachyarrhythmia. She diuresed ok w/o need for inotropic support - Co-ox remains stable at 72%, CVP < 5 -  continue to hold diuretics w/ low CVP and AKI  - Off ? blocker w/ low output  - Hold Entresto  and spiro w/ low CVP and AKI - can hopefully restart GDMT tomorrow if SCr continues to trend down - Continue digoxin  0.125 mg daily. Check level tomorrow   2. Severe MR - severe by TTE and TEE  - suspect functional, severe LAE. Further c/b AFL  - will need reassessment of MV now that HF/AFL optimized. If still severe, will need assessment for mTEER    4. AFL - current rates in the 90s - off ? blocker given low output HF - s/p TEE/DCCV >>NSR (9/26). Maintaining NSR  - Continue amiodarone  400 mg BID. Decrease to 200 mg BID 9/30  - TEE this admit showed no LA thrombus - Continue eliquis  5 mg BID - Per EP, she would be a candidate for AFL ablation despite her obesity - her pannus is retractable, but I suspect she would develop AF given her underlying pathology  - Needs outpatient assessment for OSA     5. CAD - LHC 02/25/24 w/ mild nonobstructive CAD - medical management  - no ASA given need for a/c - Continue statin. LDL 92 9/25   6. AKI on CKD III - SCr 1.8 on admit - 1.4>1.6>1.9>2.3 with diureses - low-out on RHC but diuresed well w/ IV Lasix   - CVP low, continue to hold diuretics today  - monitor UOP and SCr closely    Length of Stay: [address], PA-C  03/01/2024, 8:23 AM  Advanced Heart Failure Team Pager [phone] (M-F; 7a - 5p)  Please contact [facility] for night-coverage after hours (5p -7a ) and weekends on [email or web address]   Patient seen with PA, I formulated the plan and agree with the above note.   Co-ox 72%, CVP <5.  GDMT held with AKI, creatinine now trending down, I/Os net negative 1164.  She remains in NSR on amiodarone .   General: NAD Neck: No JVD, no thyromegaly or thyroid nodule.  Lungs: Clear to auscultation bilaterally with normal respiratory effort. CV: Nondisplaced PMI.  Heart regular S1/S2, no S3/S4, 1/6 HSM apex.  No peripheral edema.    Abdomen: Soft, nontender, no hepatosplenomegaly, no distention.  Skin: Intact without lesions or rashes.  Neurologic: Alert and oriented x 3.  Psych: Normal affect. Extremities: No clubbing or cyanosis.  HEENT: Normal.   Nonischemic cardiomyopathy with severe functional MR.  Developed AKI with diuresis, now creatinine improving (1.99 today).  CVP < 5, co-ox good at 72%.  - No diuretic today.  - Can start Farxiga 10 mg  daily - Continue digoxin  - If creatinine continues to come down, restart Entresto  tomorrow.  - Repeat limited echo to reassess MR post-diuresis. May need mTEER in future.   Ezra Shuck 03/01/2024 11:01 AM

## 2024-03-01 NOTE — Progress Notes (Signed)
 Physical Therapy Treatment Patient Details Name: Abigail Sharp MRN: [id number] DOB: 10/29/1965 Today's Date: 03/01/2024   History of Present Illness Pt is 58 yo presenting [facility] on [facility]/16 due to acute heart failure on chronic. Remains severely volume overloaded on exam; S/p RHC 9/24 with severely elevated biventricular filling pressures. She has refused PICC line placement 9/25. Pt with symptomatic orthostatic hypotension working with mobility specialist 9/25. S/p DCCV 9/26. Orthostatics negative 9/29 PT session. PMH: CHF, anxiety/depression, agoraphobia, HTN, nonischemic cardiomyopathy, and HFimpEF.    PT Comments  Pt received in supine, pleasantly agreeable to therapy session and with good participation and improved tolerance for transfer and gait training with RW this session. Pt trialed rollator walker, however felt it was a bit too free-moving and not as stable as RW, therefore continue to recommend RW (bari width due to pt size) and pt requesting wheelchair if possible. PTA educated her that insurance may not cover both devices, pt aware. Pt performed household distance gait trial with RW and no orthostatic symptoms this date, HR WFL. PTA asked pt what her plan was for hair mgmt since it is fully matted and pt requesting staff members to cut it for her short so she can better manage at home, RN/NT notified of pt request. Pt continues to benefit from PT services to progress toward functional mobility goals.     If plan is discharge home, recommend the following: Assistance with cooking/housework;Assist for transportation;Help with stairs or ramp for entrance;A little help with walking and/or transfers;A little help with bathing/dressing/bathroom   Can travel by private vehicle        Equipment Recommendations  Rolling walker (2 wheels);Wheelchair (measurements PT) (bariatric width RW due to body habitus; pt states she has rollator at home but wasn't able to figure out how to put it together)     Recommendations for Other Services       Precautions / Restrictions Precautions Precautions: Fall Recall of Precautions/Restrictions: Impaired Precaution/Restrictions Comments: heart cath precs on RUE ended 9/29 Restrictions Weight Bearing Restrictions Per Provider Order: No     Mobility  Bed Mobility Overal bed mobility: Needs Assistance Bed Mobility: Supine to Sit     Supine to sit: Supervision, HOB elevated     General bed mobility comments: assist with lines/linens    Transfers Overall transfer level: Needs assistance Equipment used: Rolling walker (2 wheels) Transfers: Sit to/from Stand Sit to Stand: Supervision           General transfer comment: from EOB<>RW, cues for safe UE placement    Ambulation/Gait Ambulation/Gait assistance: Supervision Gait Distance (Feet): 200 Feet Assistive device: Rolling walker (2 wheels), Rollator (4 wheels) Gait Pattern/deviations: Step-through pattern, Decreased stride length Gait velocity: decreased     General Gait Details: Cues needed for proximity to RW, forward gaze, activity pacing; ~3 brief standing breaks to achieve this distance. 88ft with rollator, otherwise pt used RW as she found it to be more stable   Comptroller Bed    Modified Rankin (Stroke Patients Only)       Balance Overall balance assessment: Mild deficits observed, not formally tested         Standing balance support: Reliant on assistive device for balance, During functional activity, Bilateral upper extremity supported, Single extremity supported Standing balance-Leahy Scale: Poor Standing balance comment: Fair with AD; poor without  Communication Communication Communication: No apparent difficulties  Cognition Arousal: Alert Behavior During Therapy: Flat affect   PT - Cognitive impairments: No family/caregiver present to determine baseline, Problem  solving, Safety/Judgement, Memory                       PT - Cognition Comments: Pt naming different people who may be able to assist her post-DC than she did in initial evaluation, but appears aware of some limitations and states she wants her hair cut here before DC since she hasn't been able to manage it/detangle it at home. Following commands: Intact      Cueing Cueing Techniques: Verbal cues  Exercises Other Exercises Other Exercises: PTA encouraging use of IS, pt states IS fell on floor so PTA cleaned mouth tube with soap and water  and left it to dry on paper towel and wiped off device, pt agreeable to use it later in the day when tube is more dried out.    General Comments General comments (skin integrity, edema, etc.): BP 150/79 HR 66 bpm sitting EOB, BP 145/83 (99) standing, no dizziness throughout; HR 70's bpm with exertion.      Pertinent Vitals/Pain Pain Assessment Pain Assessment: Faces Faces Pain Scale: No hurt Pain Intervention(s): Monitored during session, Repositioned    Home Living                          Prior Function            PT Goals (current goals can now be found in the care plan section) Acute Rehab PT Goals Patient Stated Goal: to go home independently PT Goal Formulation: With patient Time For Goal Achievement: 03/04/24 Progress towards PT goals: Progressing toward goals    Frequency    Min 2X/week      PT Plan      Co-evaluation              AM-PAC PT 6 Clicks Mobility   Outcome Measure  Help needed turning from your back to your side while in a flat bed without using bedrails?: A Little Help needed moving from lying on your back to sitting on the side of a flat bed without using bedrails?: A Little (w/o rail) Help needed moving to and from a bed to a chair (including a wheelchair)?: A Little Help needed standing up from a chair using your arms (e.g., wheelchair or bedside chair)?: A Little Help needed to  walk in hospital room?: A Little Help needed climbing 3-5 steps with a railing? : A Lot (anticipated) 6 Click Score: 17    End of Session Equipment Utilized During Treatment: Gait belt Activity Tolerance: Patient tolerated treatment well Patient left: in bed;with call bell/phone within reach;Other (comment);with bed alarm set (pt sitting EOB per request, NT notified) Nurse Communication: Mobility status;Other (comment);Precautions (pt requesting to have her hair trimmed since it's matted and she can't go anywhere to get it cut and can't wash it on her own with how matted it is.) PT Visit Diagnosis: Unsteadiness on feet (R26.81);Muscle weakness (generalized) (M62.81)     Time: 8761-8743 PT Time Calculation (min) (ACUTE ONLY): 18 min  Charges:    $Gait Training: 8-22 mins PT General Charges $$ ACUTE PT VISIT: 1 Visit                     Fredda Clarida P., PTA Acute Rehabilitation Services Secure Chat Preferred 9a-5:30pm Office: [phone]  Matisyn Cabeza M Jaze Rodino 03/01/2024, 2:00 PM

## 2024-03-02 ENCOUNTER — Inpatient Hospital Stay (HOSPITAL_COMMUNITY): Payer: MEDICAID

## 2024-03-02 DIAGNOSIS — I342 Nonrheumatic mitral (valve) stenosis: Secondary | ICD-10-CM

## 2024-03-02 DIAGNOSIS — I5031 Acute diastolic (congestive) heart failure: Secondary | ICD-10-CM | POA: Diagnosis not present

## 2024-03-02 LAB — ECHOCARDIOGRAM LIMITED
Area-P 1/2: 4.21 cm2
Height: 64 in
MV M vel: 5.51 m/s
MV Peak grad: 121.4 mmHg
Radius: 0.76 cm
S' Lateral: 4.1 cm
Single Plane A4C EF: 63.1 %
Weight: 4328.07 [oz_av]

## 2024-03-02 LAB — BASIC METABOLIC PANEL WITH GFR
Anion gap: 10 (ref 5–15)
BUN: 46 mg/dL — ABNORMAL HIGH (ref 6–20)
CO2: 21 mmol/L — ABNORMAL LOW (ref 22–32)
Calcium: 9 mg/dL (ref 8.9–10.3)
Chloride: 107 mmol/L (ref 98–111)
Creatinine, Ser: 1.7 mg/dL — ABNORMAL HIGH (ref 0.44–1.00)
GFR, Estimated: 35 mL/min — ABNORMAL LOW (ref >60)
Glucose, Bld: 87 mg/dL (ref 70–99)
Potassium: 4.1 mmol/L (ref 3.5–5.1)
Sodium: 138 mmol/L (ref 135–145)

## 2024-03-02 LAB — MAGNESIUM: Magnesium: 2 mg/dL (ref 1.7–2.4)

## 2024-03-02 LAB — COOXEMETRY PANEL
Carboxyhemoglobin: 1.7 % — ABNORMAL HIGH (ref 0.5–1.5)
Methemoglobin: <0.7 % (ref 0.0–1.5)
O2 Saturation: 81.5 %
Total hemoglobin: 12.2 g/dL (ref 12.0–16.0)

## 2024-03-02 LAB — DIGOXIN LEVEL: Digoxin Level: 0.8 ng/mL (ref 0.8–2.0)

## 2024-03-02 MED ORDER — SACUBITRIL-VALSARTAN 24-26 MG PO TABS
1.0000 | ORAL_TABLET | Freq: Two times a day (BID) | ORAL | Status: DC
  Administered 2024-03-02 – 2024-03-03 (×3): 1 via ORAL
  Filled 2024-03-02 (×3): qty 1

## 2024-03-02 MED ORDER — CARVEDILOL 3.125 MG PO TABS
3.1250 mg | ORAL_TABLET | Freq: Two times a day (BID) | ORAL | Status: DC
  Administered 2024-03-02 – 2024-03-03 (×3): 3.125 mg via ORAL
  Filled 2024-03-02 (×3): qty 1

## 2024-03-02 NOTE — Progress Notes (Signed)
  Echocardiogram 2D Echocardiogram has been performed.  Koleen KANDICE Popper, RDCS 03/02/2024, 8:20 AM

## 2024-03-02 NOTE — Plan of Care (Signed)

## 2024-03-02 NOTE — TOC Progression Note (Signed)
 Transition of Care [facility]) - Progression Note    Patient Details  Name: Abigail Sharp MRN: [id number] Date of Birth: 1966/01/28  [facility] CM/SW Contact  Justina Delcia Czar, RN Phone Number: [phone] 03/02/2024, 4:28 PM  Clinical Narrative:     Spoke to pt and her friend will provide transportation to home. Offered choice for HH/DME and patient agreeable to agency that will accept insurance plan. Contacted [employer] rep, Jermaine for wheelchair and hospital bed for home.  Contacted [employer] rep, Kelly for [facility]. Accepted referral.   Expected Discharge Plan: Home w Home Health Services Barriers to Discharge: Continued Medical Work up    Expected Discharge Plan and Services   Discharge Planning Services: CM Consult Post Acute Care Choice: Durable Medical Equipment, Home Health Living arrangements for the past 2 months: Apartment                 DME Arranged: Community education officer wheelchair with seat cushion, Hospital bed DME Agency: [employer] Date DME Agency Contacted: 03/02/24 Time DME Agency Contacted: 1626 Representative spoke with at DME Agency: [place] HH Arranged: RN HH Agency: [facility] Date [facility] Agency Contacted: 03/02/24 Time HH Agency Contacted: 1626 Representative spoke with at [facility] Agency: [place]   Social Drivers of Health (SDOH) Interventions SDOH Screenings   Food Insecurity: No Food Insecurity (02/18/2024)  Housing: Low Risk  (02/18/2024)  Transportation Needs: No Transportation Needs (02/18/2024)  Utilities: Not At Risk (02/18/2024)  Depression (PHQ2-9): High Risk (06/19/2023)  Tobacco Use: Low Risk  (02/17/2024)    Readmission Risk Interventions     No data to display

## 2024-03-02 NOTE — TOC CM/SW Note (Signed)
.      Durable Medical Equipment  (From admission, onward)           Start     Ordered   03/02/24 1600  For home use only DME Hospital bed  Once       Comments: Therapeutic mattress  Question Answer Comment  Length of Need Lifetime   Patient has (list medical condition): CHF   The above medical condition requires: Patient requires the ability to reposition immediately   Head must be elevated greater than: 45 degrees   Bed type Semi-electric   Trapeze Bar Yes      03/02/24 1600   03/02/24 1554  For home use only DME lightweight manual wheelchair with seat cushion  Once       Comments: Patient suffers from CHF, physical deconditioning which impairs their ability to perform daily activities like bathing, dressing, feeding, grooming, and toileting in the home.  A cane, crutch, or walker will not resolve  issue with performing activities of daily living. A wheelchair will allow patient to safely perform daily activities. Patient is not able to propel themselves in the home using a standard weight wheelchair due to arm weakness, endurance, and general weakness. Patient can self propel in the lightweight wheelchair. Length of need Lifetime. Accessories: elevating leg rests (ELRs), wheel locks, extensions and anti-tippers, back cushion. Bariatric, weight 122.7 kg, 5'4   03/02/24 1600

## 2024-03-02 NOTE — Progress Notes (Signed)
 Physical Therapy Treatment Patient Details Name: Abigail Sharp MRN: [id number] DOB: [birth year] Today's Date: 03/02/2024   History of Present Illness Pt is 58 yo presenting to [facility] on 9/16 due to acute heart failure on chronic. Remains severely volume overloaded on exam; S/p RHC 9/24 with severely elevated biventricular filling pressures. She has refused PICC line placement 9/25. Pt with symptomatic orthostatic hypotension working with mobility specialist 9/25. S/p DCCV 9/26. Orthostatics negative 9/29 PT session. PMH: CHF, anxiety/depression, agoraphobia, HTN, nonischemic cardiomyopathy, and HFimpEF.    PT Comments  Pt received in supine, alert and agreeable to therapy session for further gait and transfer safety instruction. Pt with improved RW management this date using bari RW as she is able to maintain hips closer inside AD which she reports helps with bil LE joint pain. SpO2/HR WFL on RA, no dizziness reported this date. Plan to work more on stair negotiation next date. Pt continues to benefit from PT services to progress toward functional mobility goals.     If plan is discharge home, recommend the following: Assistance with cooking/housework;Assist for transportation;Help with stairs or ramp for entrance;A little help with walking and/or transfers;A little help with bathing/dressing/bathroom   Can travel by private vehicle        Equipment Recommendations  Rolling walker (2 wheels);Wheelchair (measurements PT) (bariatric width RW due to body habitus; pt states she has rollator at home but wasn't able to figure out how to put it together)    Recommendations for Other Services       Precautions / Restrictions Precautions Precautions: Fall Recall of Precautions/Restrictions: Impaired Precaution/Restrictions Comments: RHC (heart cath) precs ended 9/29 Restrictions Weight Bearing Restrictions Per Provider Order: No     Mobility  Bed Mobility Overal bed mobility: Needs Assistance Bed  Mobility: Supine to Sit     Supine to sit: Modified independent (Device/Increase time), HOB elevated, Used rails     General bed mobility comments: to R EOB    Transfers Overall transfer level: Needs assistance Equipment used: None Transfers: Sit to/from Stand Sit to Stand: Supervision           General transfer comment: from EOB without AD, no buckling, pt used bari RW after taking a few steps due to c/o LE joint pain    Ambulation/Gait Ambulation/Gait assistance: Supervision Gait Distance (Feet): 300 Feet Assistive device: Rolling walker (2 wheels) (bariatric width) Gait Pattern/deviations: Step-through pattern, Decreased stride length Gait velocity: decreased     General Gait Details: Cues needed for proximity to RW, forward gaze, a couple brief standing breaks but no c/o dizziness or dyspnea today.   Stairs             Wheelchair Mobility     Tilt Bed    Modified Rankin (Stroke Patients Only)       Balance Overall balance assessment: Mild deficits observed, not formally tested Sitting-balance support: No upper extremity supported, Feet supported Sitting balance-Leahy Scale: Normal     Standing balance support: Reliant on assistive device for balance, During functional activity Standing balance-Leahy Scale: Poor Standing balance comment: Fair with AD; poor without                            Communication Communication Communication: No apparent difficulties  Cognition Arousal: Alert Behavior During Therapy: Flat affect   PT - Cognitive impairments: No family/caregiver present to determine baseline, Problem solving, Safety/Judgement, Memory  PT - Cognition Comments: Pt asking if her hair can be cut (due to matting) but nursing staff reports they will not, pt notified. Pt frustrated as she states she cannot manage her hair and it is very matted, she wants it cut off. Per RN, pt will have to have family or  friend come to cut it for her. Following commands: Intact      Cueing Cueing Techniques: Verbal cues  Exercises Other Exercises Other Exercises: pt defers due to food tray arriving requesting to sit EOB to eat    General Comments General comments (skin integrity, edema, etc.): no acute s/sx distress with standing activity this date      Pertinent Vitals/Pain Pain Assessment Pain Assessment: Faces Faces Pain Scale: Hurts a little bit Pain Location: bil hips/knees mild discomfort Pain Descriptors / Indicators: Discomfort, Aching Pain Intervention(s): Monitored during session    Home Living                          Prior Function            PT Goals (current goals can now be found in the care plan section) Acute Rehab PT Goals Patient Stated Goal: to go home independently PT Goal Formulation: With patient Time For Goal Achievement: 03/04/24 Progress towards PT goals: Progressing toward goals    Frequency    Min 2X/week      PT Plan      Co-evaluation              AM-PAC PT 6 Clicks Mobility   Outcome Measure  Help needed turning from your back to your side while in a flat bed without using bedrails?: A Little Help needed moving from lying on your back to sitting on the side of a flat bed without using bedrails?: A Little (w/o rail) Help needed moving to and from a bed to a chair (including a wheelchair)?: A Little Help needed standing up from a chair using your arms (e.g., wheelchair or bedside chair)?: A Little Help needed to walk in hospital room?: A Little Help needed climbing 3-5 steps with a railing? : A Little (anticipated) 6 Click Score: 18    End of Session Equipment Utilized During Treatment: Gait belt Activity Tolerance: Patient tolerated treatment well Patient left: in bed;with call bell/phone within reach;Other (comment) (pt sitting EOB per request to eat) Nurse Communication: Mobility status;Other (comment);Precautions (pt  requesting to have her hair trimmed since it's matted, RN defers, pt notified family or acquaintances will need to cut it for her) PT Visit Diagnosis: Unsteadiness on feet (R26.81);Muscle weakness (generalized) (M62.81)     Time: 8669-8651 PT Time Calculation (min) (ACUTE ONLY): 18 min  Charges:    $Gait Training: 8-22 mins PT General Charges $$ ACUTE PT VISIT: 1 Visit                     Annisha Baar P., PTA Acute Rehabilitation Services Secure Chat Preferred 9a-5:30pm Office: [phone]    Connell HERO [facility] 03/02/2024, 2:44 PM

## 2024-03-02 NOTE — Progress Notes (Signed)
 Patient ID: Abigail Sharp, female   DOB: [birth year], 58 y.o.   MRN: [id number]     Advanced Heart Failure Rounding Note  Cardiologist: Madonna Large, DO  Chief Complaint: Acute systolic heart failure Subjective:    9/26 S/p DCCV>>NSR. Maintaining NSR.    Creatinine trending back down, 1.99 => 1.7.   Co-ox 81%, CVP low < 5.   Walked in hall yesterday, no complaints.   Objective:   Weight Range: 122.7 kg Body mass index is 46.43 kg/m.   Vital Signs:   Temp:  [97.6 F (36.4 C)-98.2 F (36.8 C)] 97.7 F (36.5 C) (09/30 0715) Pulse Rate:  [62-72] 70 (09/30 0715) Resp:  [14-25] 18 (09/30 0715) BP: (137-152)/(51-79) 137/66 (09/30 0715) SpO2:  [96 %-100 %] 96 % (09/30 0715) Weight:  [122.7 kg] 122.7 kg (09/30 0258) Last BM Date : 03/01/24  Weight change: Filed Weights   02/29/24 0542 03/01/24 0618 03/02/24 0258  Weight: 120.7 kg 122.5 kg 122.7 kg   Intake/Output:   Intake/Output Summary (Last 24 hours) at 03/02/2024 0750 Last data filed at 03/02/2024 0600 Gross per 24 hour  Intake --  Output 2300 ml  Net -2300 ml    Physical Exam   CVP < 5   General: NAD Neck: No JVD, no thyromegaly or thyroid nodule.  Lungs: Clear to auscultation bilaterally with normal respiratory effort. CV: Nondisplaced PMI.  Heart regular S1/S2, no S3/S4, 2/6 HSM apex  Trace ankle edema.  Abdomen: Soft, nontender, no hepatosplenomegaly, no distention.  Skin: Intact without lesions or rashes.  Neurologic: Alert and oriented x 3.  Psych: Normal affect. Extremities: No clubbing or cyanosis.  HEENT: Normal.   Telemetry   NSR 60s (Personally reviewed)    Labs    CBC Recent Labs    02/29/24 0241  WBC 6.6  HGB 12.7  HCT 39.7  MCV 77.1*  PLT 177   Basic Metabolic Panel Recent Labs    90/70/74 0310 03/02/24 0425  NA 138 138  K 4.2 4.1  CL 109 107  CO2 21* 21*  GLUCOSE 89 87  BUN 53* 46*  CREATININE 1.99* 1.70*  CALCIUM  8.7* 9.0  MG  --  2.0   Liver Function Tests No results  for input(s): AST, ALT, ALKPHOS, BILITOT, PROT, ALBUMIN in the last 72 hours. No results for input(s): LIPASE, AMYLASE in the last 72 hours. Cardiac Enzymes No results for input(s): CKTOTAL, CKMB, CKMBINDEX, TROPONINI in the last 72 hours.  BNP: BNP (last 3 results) Recent Labs    01/02/24 1504 02/17/24 1610  BNP 119.1* 856.6*    ProBNP (last 3 results) No results for input(s): PROBNP in the last 8760 hours.   D-Dimer No results for input(s): DDIMER in the last 72 hours. Hemoglobin A1C No results for input(s): HGBA1C in the last 72 hours. Fasting Lipid Panel No results for input(s): CHOL, HDL, LDLCALC, TRIG, CHOLHDL, LDLDIRECT in the last 72 hours. Thyroid Function Tests No results for input(s): TSH, T4TOTAL, T3FREE, THYROIDAB in the last 72 hours.  Invalid input(s): FREET3  Other results:   Imaging    No results found.    Medications:     Scheduled Medications:  amiodarone   200 mg Oral BID   apixaban   5 mg Oral BID   ARIPiprazole   5 mg Oral Daily   atorvastatin   40 mg Oral Daily   busPIRone   10 mg Oral BID   carvedilol   3.125 mg Oral BID WC   Chlorhexidine  Gluconate Cloth  6 each Topical  Daily   dapagliflozin propanediol  10 mg Oral Daily   digoxin   0.125 mg Oral Daily   fluticasone   2 spray Each Nare Daily   lamoTRIgine   25 mg Oral BID   sacubitril -valsartan   1 tablet Oral BID   sertraline   50 mg Oral Daily   sodium chloride  flush  10-40 mL Intracatheter Q12H    Infusions:    PRN Medications: acetaminophen , alum & mag hydroxide-simeth, sodium chloride , sodium chloride  flush    Patient Profile   58 y/o female w/ prior h/o NICM w/ improved EF, hypertension, obesity and CKD III, admitted w/ acute CHF w/ low output. Found to have severe MR on w/u. Hospital course further c/b new AFL.   Assessment/Plan   1. Acute on chronic systolic CHF: Nonischemic cardiomyopathy.  EF has fluctuated from  decreased to normal range since 2016.  TEE this admission showed EF 35-40%, moderate LV dilation, moderate RV dysfunction, and severe MR that appeared functional. LHC/RHC this admission showed nonobstructive CAD and significantly elevated right and left heart filling pressures with low CI 1.7. SBP 130s-140s. She was diuresed and developed AKI, GDMT scaled back and diuretic stopped.  Today, CVP < 5 with co-ox 81%. Creatinine trending down, 1.7 today.  - No diuretic today.  - Can restart Entresto  24/26 bid.  - Continue Farxiga 10 daily.  - Add Coreg  3.125 mg bid.  - Continue digoxin  0.125.  2. Atrial flutter: AFL was first noted this admission.  Mitral regurgitation will make this harder to manage.  S/p DCCV, now in NSR.  - Continue po amiodarone .  - Continue Eliquis .  3. Mitral regurgitation: This appears severe and functional on TEE.  Noted from prior echoes when EF is low, improves when EF is in normal range.  It may be that we can decrease the mitral regurgitation by diuresis, resumption of NSR, and afterload reduction/GDMT titration.  - Repeat limited echo in NSR and with diuresis (today).  5. CAD: Nonobstructive on cath today.  - No ASA with Eliquis  use.  - Add statin.  6. AKI on CKD stage 3: Creatinine lower today at 1.7 7. Depression/anxiety: See med list for psych meds.  QTc mildly prolonged at baseline, follow closely.   Mobilize, maybe home tomorrow.   Ezra Shuck 03/02/2024 7:50 AM

## 2024-03-02 NOTE — Progress Notes (Signed)
 Abigail Sharp  FMW:[id number] DOB: 11-23-1965 DOA: 02/17/2024 PCP: de [place], Raymond J, MD    Brief Narrative:  58 year old with a history of HTN and CHF who presented to the ER 9/16 with chest pain and shortness of breath which had been steadily building for months.  She was found to have uncontrolled hypertension with presenting blood pressure 160/110.  EKG was nonacute.  Hemoglobin was 11.2.  BNP was elevated at 856.  Chest x-ray revealed vascular congestion.  Following her admission a TTE was accomplished which noted EF of 40-45% with moderate to severe MR.  She was noted to be in atrial fibrillation for which heparin  was initiated.  She was diuresed.  As she has stabilized she has been transitioned to a DOAC.  She underwent TEE 9/22 which noted EF 35-40% with moderately reduced RV systolic function and severe MR.  Left and right heart cath 9/24 noted nonobstructive coronary artery disease and significantly elevated right and left heart filling pressures, which led to an advanced heart failure team consultation.  She underwent successful DCCV 9/26.  Goals of Care:   Code Status: Full Code   DVT prophylaxis: apixaban  (ELIQUIS ) tablet 5 mg   Interim Hx: No acute events reported overnight.  Afebrile.  Vital signs stable.  Remains in NSR.  Advanced Heart Failure Team continues to fine-tune her treatment.  She is resting in bed comfortably at time of visit with no complaints.  Assessment & Plan:  Acute systolic congestive heart failure exacerbation Care per Advanced Heart Failure Team - EF 35-40% via TEE 02/23/2024 - nonischemic - RHC noted elevated biventricular filling pressures and low cardiac output in setting of severe MR - off beta-blocker due to low output - diuretic on hold at present due to climbing creatinine, with creatinine appearing to stabilize in absence of diuretic - continue digoxin  - she is net negative approximately 25 L since admission - Farxiga and Entresto  initiated this admit    [facility] Weights   02/29/24 0542 03/01/24 0618 03/02/24 0258  Weight: 120.7 kg 122.5 kg 122.7 kg     Severe MR Care per Cardiology - cardiology planning for limited follow-up echo today  Newly appreciated persistent atrial flutter/atrial fibrillation POA TSH normal - amiodarone , digoxin , and Eliquis  per Cardiology - s/p successful DCCV 9/26 - maintaining NSR thus far   CAD LHC 02/25/2024 noted mild nonobstructive coronary artery disease with recommendation being for medical management only - continue statin - no aspirin  as patient on DOAC  AKI on CKD stage IIIb Baseline creatinine for past month has been approximately 1.5 - diuretic on hold presently due to climbing creatinine - at this time it appears that the patient's creatinine has stabilized  Recent Labs  Lab 02/27/24 0425 02/28/24 0310 02/29/24 0241 03/01/24 0310 03/02/24 0425  CREATININE 1.96* 2.14* 2.28* 1.99* 1.70*     Prolonged QTc Celexa  dose decreased during this admission  Anxiety/depression/OCD Noncompliant with medications at home - followed by Psychiatry as an outpatient  Morbid obesity - Body mass index is 46.43 kg/m.   Family Communication: No family present at time of exam Disposition:   Home Health Pt9/29/2025 1300  Objective: Blood pressure 136/62, pulse 67, temperature 98.7 F (37.1 C), temperature source Oral, resp. rate 18, height 5' 4 (1.626 m), weight 122.7 kg, last menstrual period 03/29/2016, SpO2 97%.  Intake/Output Summary (Last 24 hours) at 03/02/2024 1100 Last data filed at 03/02/2024 0600 Gross per 24 hour  Intake --  Output 1300 ml  Net -1300 ml  Filed Weights   02/29/24 0542 03/01/24 0618 03/02/24 0258  Weight: 120.7 kg 122.5 kg 122.7 kg    Examination: General: No acute respiratory distress -alert and conversant Lungs: Good breath sounds throughout without wheezing or crackles Cardiovascular: Regular rate and rhythm  Abdomen: Obese, soft, NT/ND, BS  positive Extremities: trace B LE edema without change  CBC: Recent Labs  Lab 02/27/24 0425 02/28/24 0310 02/29/24 0241  WBC 6.3 6.3 6.6  HGB 13.8 13.3 12.7  HCT 43.6 42.3 39.7  MCV 78.0* 78.2* 77.1*  PLT 232 206 177   Basic Metabolic Panel: Recent Labs  Lab 02/29/24 0241 03/01/24 0310 03/02/24 0425  NA 138 138 138  K 4.1 4.2 4.1  CL 108 109 107  CO2 19* 21* 21*  GLUCOSE 88 89 87  BUN 55* 53* 46*  CREATININE 2.28* 1.99* 1.70*  CALCIUM  8.7* 8.7* 9.0  MG  --   --  2.0   GFR: Estimated Creatinine Clearance: 47.2 mL/min (A) (by C-G formula based on SCr of 1.7 mg/dL (H)).   Scheduled Meds:  amiodarone   200 mg Oral BID   apixaban   5 mg Oral BID   ARIPiprazole   5 mg Oral Daily   atorvastatin   40 mg Oral Daily   busPIRone   10 mg Oral BID   carvedilol   3.125 mg Oral BID WC   Chlorhexidine  Gluconate Cloth  6 each Topical Daily   dapagliflozin propanediol  10 mg Oral Daily   digoxin   0.125 mg Oral Daily   fluticasone   2 spray Each Nare Daily   lamoTRIgine   25 mg Oral BID   sacubitril -valsartan   1 tablet Oral BID   sertraline   50 mg Oral Daily   sodium chloride  flush  10-40 mL Intracatheter Q12H      LOS: 13 days   Reyes IVAR Moores, MD [facility] Office  [phone] Pager - Text Page per Tracey  If 7PM-7AM, please contact night-coverage per Amion 03/02/2024, 11:00 AM

## 2024-03-03 ENCOUNTER — Other Ambulatory Visit (HOSPITAL_COMMUNITY): Payer: Self-pay

## 2024-03-03 ENCOUNTER — Telehealth (HOSPITAL_COMMUNITY): Payer: Self-pay | Admitting: Pharmacy Technician

## 2024-03-03 DIAGNOSIS — E66813 Obesity, class 3: Secondary | ICD-10-CM

## 2024-03-03 DIAGNOSIS — I2510 Atherosclerotic heart disease of native coronary artery without angina pectoris: Secondary | ICD-10-CM

## 2024-03-03 DIAGNOSIS — I4892 Unspecified atrial flutter: Secondary | ICD-10-CM | POA: Diagnosis not present

## 2024-03-03 DIAGNOSIS — I10 Essential (primary) hypertension: Secondary | ICD-10-CM | POA: Diagnosis not present

## 2024-03-03 DIAGNOSIS — F32A Depression, unspecified: Secondary | ICD-10-CM

## 2024-03-03 DIAGNOSIS — N1832 Chronic kidney disease, stage 3b: Secondary | ICD-10-CM

## 2024-03-03 DIAGNOSIS — I2583 Coronary atherosclerosis due to lipid rich plaque: Secondary | ICD-10-CM

## 2024-03-03 DIAGNOSIS — I5031 Acute diastolic (congestive) heart failure: Secondary | ICD-10-CM | POA: Diagnosis not present

## 2024-03-03 DIAGNOSIS — I5033 Acute on chronic diastolic (congestive) heart failure: Secondary | ICD-10-CM

## 2024-03-03 LAB — BASIC METABOLIC PANEL WITH GFR
Anion gap: 8 (ref 5–15)
BUN: 46 mg/dL — ABNORMAL HIGH (ref 6–20)
CO2: 23 mmol/L (ref 22–32)
Calcium: 9 mg/dL (ref 8.9–10.3)
Chloride: 106 mmol/L (ref 98–111)
Creatinine, Ser: 1.59 mg/dL — ABNORMAL HIGH (ref 0.44–1.00)
GFR, Estimated: 38 mL/min — ABNORMAL LOW (ref >60)
Glucose, Bld: 93 mg/dL (ref 70–99)
Potassium: 4.4 mmol/L (ref 3.5–5.1)
Sodium: 137 mmol/L (ref 135–145)

## 2024-03-03 LAB — COOXEMETRY PANEL
Carboxyhemoglobin: 1.6 % — ABNORMAL HIGH (ref 0.5–1.5)
Methemoglobin: 0.7 % (ref 0.0–1.5)
O2 Saturation: 77 %
Total hemoglobin: 12.7 g/dL (ref 12.0–16.0)

## 2024-03-03 LAB — MAGNESIUM: Magnesium: 2.1 mg/dL (ref 1.7–2.4)

## 2024-03-03 MED ORDER — SERTRALINE HCL 50 MG PO TABS
50.0000 mg | ORAL_TABLET | Freq: Every day | ORAL | 0 refills | Status: AC
  Filled 2024-03-03: qty 90, 90d supply, fill #0

## 2024-03-03 MED ORDER — APIXABAN 5 MG PO TABS
5.0000 mg | ORAL_TABLET | Freq: Two times a day (BID) | ORAL | 0 refills | Status: AC
  Filled 2024-03-03: qty 180, 90d supply, fill #0

## 2024-03-03 MED ORDER — LAMOTRIGINE 25 MG PO TABS
25.0000 mg | ORAL_TABLET | Freq: Two times a day (BID) | ORAL | 0 refills | Status: AC
  Filled 2024-03-03: qty 60, 30d supply, fill #0

## 2024-03-03 MED ORDER — FUROSEMIDE 40 MG PO TABS: 40.0000 mg | ORAL_TABLET | Freq: Every day | ORAL | Status: DC | PRN

## 2024-03-03 MED ORDER — CARVEDILOL 3.125 MG PO TABS
3.1250 mg | ORAL_TABLET | Freq: Two times a day (BID) | ORAL | 0 refills | Status: AC
  Filled 2024-03-03: qty 180, 90d supply, fill #0

## 2024-03-03 MED ORDER — AMIODARONE HCL 200 MG PO TABS
200.0000 mg | ORAL_TABLET | Freq: Two times a day (BID) | ORAL | 0 refills | Status: DC
  Filled 2024-03-03: qty 60, 30d supply, fill #0

## 2024-03-03 MED ORDER — SACUBITRIL-VALSARTAN 24-26 MG PO TABS
1.0000 | ORAL_TABLET | Freq: Two times a day (BID) | ORAL | 0 refills | Status: DC
  Filled 2024-03-03: qty 180, 90d supply, fill #0

## 2024-03-03 MED ORDER — ATORVASTATIN CALCIUM 40 MG PO TABS
40.0000 mg | ORAL_TABLET | Freq: Every day | ORAL | 0 refills | Status: AC
  Filled 2024-03-03: qty 90, 90d supply, fill #0

## 2024-03-03 MED ORDER — POTASSIUM CHLORIDE CRYS ER 20 MEQ PO TBCR: 40.0000 meq | EXTENDED_RELEASE_TABLET | Freq: Every day | ORAL | Status: DC | PRN

## 2024-03-03 MED ORDER — DIGOXIN 125 MCG PO TABS: 0.0625 mg | ORAL_TABLET | Freq: Every day | ORAL | Status: DC

## 2024-03-03 MED ORDER — ARIPIPRAZOLE 5 MG PO TABS
5.0000 mg | ORAL_TABLET | Freq: Every day | ORAL | 0 refills | Status: AC
  Filled 2024-03-03: qty 30, 30d supply, fill #0

## 2024-03-03 MED ORDER — BUSPIRONE HCL 10 MG PO TABS
10.0000 mg | ORAL_TABLET | Freq: Two times a day (BID) | ORAL | 0 refills | Status: AC
  Filled 2024-03-03: qty 60, 30d supply, fill #0

## 2024-03-03 MED ORDER — FUROSEMIDE 40 MG PO TABS
40.0000 mg | ORAL_TABLET | Freq: Every day | ORAL | 0 refills | Status: AC | PRN
  Filled 2024-03-03: qty 30, 30d supply, fill #0

## 2024-03-03 MED ORDER — SPIRONOLACTONE 25 MG PO TABS
12.5000 mg | ORAL_TABLET | Freq: Every day | ORAL | 0 refills | Status: AC
  Filled 2024-03-03: qty 45, 90d supply, fill #0

## 2024-03-03 MED ORDER — SPIRONOLACTONE 12.5 MG HALF TABLET
12.5000 mg | ORAL_TABLET | Freq: Every day | ORAL | Status: DC
  Administered 2024-03-03: 12.5 mg via ORAL
  Filled 2024-03-03: qty 1

## 2024-03-03 MED ORDER — POTASSIUM CHLORIDE CRYS ER 20 MEQ PO TBCR
40.0000 meq | EXTENDED_RELEASE_TABLET | Freq: Every day | ORAL | 0 refills | Status: AC | PRN
  Filled 2024-03-03: qty 60, 30d supply, fill #0

## 2024-03-03 MED ORDER — DAPAGLIFLOZIN PROPANEDIOL 10 MG PO TABS
10.0000 mg | ORAL_TABLET | Freq: Every day | ORAL | 0 refills | Status: DC
  Filled 2024-03-03: qty 30, 30d supply, fill #0

## 2024-03-03 NOTE — Assessment & Plan Note (Signed)
 Continue blood pressure control with entresto , carvedilol  and spironolactone 

## 2024-03-03 NOTE — Progress Notes (Deleted)
 {This patient may be at risk for Amyloid. She has one or more dx on the prob list or PMH from the following list -  Abnormal EKG, HFpEF/Diastolic CHF, Aortic Stenosis, LVH, Bilateral Carpal Tunnel Syndrome, Biceps Tendon Rupture, Spinal Stenosis, Pericardial Effusion, Left Atrial Enlargement, Conduction System Disorder. See list below or review PMH.  Diagnoses From Problem List           Noted     Acute heart failure with preserved ejection fraction (HFpEF) (HCC) 02/18/2024     Acute on chronic diastolic CHF (congestive heart failure) (HCC) Unknown     QT prolongation 02/20/2024    Click HERE to open Cardiac Amyloid Screening SmartSet to order screening OR Click HERE to defer testing for 1 year or permanently :1}     OFFICE NOTE:    Date:  03/03/2024  ID:  Abigail Sharp, DOB 09-02-1965, MRN [id number] PCP: de [place], Raymond J, MD  [facility] Providers Cardiologist:  Madonna Large, DO { Click to update primary MD,subspecialty MD or APP then REFRESH:1}       HFmrEF (heart failure with mildly reduced ejection fraction)  EF was 25 in 2016 >> improved to normal  Non-ischemic cardiomyopathy (no CAD on cath in 2016) TTE 12/15/20: EF 60-65 TTE 02/19/24: EF 40-45, global HK, NL RVSF, mild pulmonary hypertension, severe LAE, mod RAE, mod to sever MR TEE 02/23/24: EF 35-40, global HK, mild RVE, mod LAE, mild RAE, small effusion, + bubble study (small PFO), 2 MR jets, findings c/w severe MR  TTE 03/02/34: EF 45-50 global HK, mod LVE, NL PASP, mild LAE, mod to severe MR Coronary artery disease  LHC 02/25/24: LM mild dz; LAD prox 50, dist 40, D1 50; LCx 30, OM2 40; RCA irregs  Paroxysmal atrial flutter Moderate pulmonary hypertension  RHC 02/25/24: mean PAP 38, mean PCWP 38, LVEDP 29  Mitral regurgitation  Severe by TEE 02/2024; appears to be functional  Hypertension  Patent Foramen Ovale  Anxiety/Depression Agoraphobia         Discussed the use of AI scribe software for clinical note  transcription with the patient, who gave verbal consent to proceed. History of Present Illness Abigail Sharp is a 58 y.o. female who returns for post hospital follow up. She is a prior patient of Dr. Claudene. She was last seen by him in 2021. She established with Dr. Large with symptoms of progressive shortness of breath.She was off GDMT for CHF and some meds were resumed. She was sent the the ED for evaluation. She was placed on antibiotics for UTI and DC to home.   She was admitted 9/16-10/1 with a/c CHF. She was started on IV diuresis. TTE demonstrated worsening LVF with EF 40-45 and mod to severe MR. She was noted to have paroxysms of AFlutter and started on anticoagulation. R and L cardiac catheterization was done and showed mild to mod nonobstructive CAD. RHC demonstrated elevated filling pressures and low CO (3.82). She was seen by AHF team. She developed AKI and diuresis was stopped. TEE demonstrated severe MR, EF 35-40, mod RV dysfunction and a Patent Foramen Ovale. MR appeared to be functional. She developed AFlutter with rapid rate. She was placed on Amiodarone  and underwent DCCV. GDMT was adjusted by the AHF team. She did not require inotropes. She was DC on Entresto , Farxiga, Coreg , Digoxin , Spironolactone , Amio, Eliquis , Lasix  prn, K prn.    ROS-See HPI***    Studies Reviewed:      *** Results  Risk Assessment/Calculations: {  Does this patient have ATRIAL FIBRILLATION?:[phone]}        Physical Exam:  VS:  LMP 03/29/2016        Wt Readings from Last 3 Encounters:  03/03/24 270 lb 8.1 oz (122.7 kg)  01/02/24 277 lb (125.6 kg)  01/02/24 277 lb (125.6 kg)    Physical Exam***     Assessment and Plan:    Assessment & Plan  Assessment and Plan Assessment & Plan    {      :1}    {Are you ordering a CV Procedure (e.g. stress test, cath, DCCV, TEE, etc)?   Press F2        :[phone]}  Dispo:  No follow-ups on file.  Signed, Glendia Ferrier, PA-C

## 2024-03-03 NOTE — Assessment & Plan Note (Addendum)
 Continue aripiprazole , buspirone  and lamotrigine . Reduced dose of sertraline  due to qtc prolongation.

## 2024-03-03 NOTE — Assessment & Plan Note (Signed)
 Mild 2 vessel coronary artery disease, involving LAD and OM2 Proximal LAD to mid LAD lesion 50% stenosed with 50% stenosed side branch in 1st diagonal. Distal LAD lesion 40% stenosed. Distal Cx lesion 30% stenosed, 2nd mrg lesion is 40% stenosed.   Recommendation to continue monotherapy with apixaban  and hold on antiplatelet therapy to decrease risk of bleeding.  Continue statin therapy 40 mg atorvastatin .

## 2024-03-03 NOTE — Telephone Encounter (Signed)
 Pharmacy Patient Advocate Encounter   Received notification from Inpatient Request that prior authorization for Farxiga 10 mg tablets is required/requested.   Insurance verification completed.   The patient is insured through [facility] MEDICAID.   Per test claim: PA required; PA started via CoverMyMeds. KEY [id number] . Waiting for clinical questions to populate.

## 2024-03-03 NOTE — Progress Notes (Addendum)
 Patient ID: Abigail Sharp, female   DOB: [birth year], 58 y.o.   MRN: [id number]     Advanced Heart Failure Rounding Note  Cardiologist: Madonna Large, DO  Consulting HF Cardiologist: Dr. Rolan  Chief Complaint: Acute systolic heart failure  Subjective:    9/26 S/p DCCV>>NSR.    Creatinine trending back down, 1.99 >>1.6.  Co-ox 77%. CVP 2.  Maintaining SR.  No complaints. Ready to go home. Ambulated 300 feet with PT yesterday.  Objective:   Weight Range: 122.7 kg Body mass index is 46.43 kg/m.   Vital Signs:   Temp:  [97.8 F (36.6 C)-98.7 F (37.1 C)] 97.8 F (36.6 C) (10/01 0721) Pulse Rate:  [60-64] 64 (10/01 0852) Resp:  [16-24] 16 (10/01 0852) BP: (119-144)/(42-70) 144/70 (10/01 0852) SpO2:  [91 %-99 %] 99 % (10/01 0852) Weight:  [122.7 kg] 122.7 kg (10/01 0500) Last BM Date : 03/01/24  Weight change: Filed Weights   03/01/24 0618 03/02/24 0258 03/03/24 0500  Weight: 122.5 kg 122.7 kg 122.7 kg   Intake/Output:   Intake/Output Summary (Last 24 hours) at 03/03/2024 1100 Last data filed at 03/03/2024 1057 Gross per 24 hour  Intake 10 ml  Output 2000 ml  Net -1990 ml    Physical Exam   General:  Chronically ill appearing Cor: JVP flat. Regular rate & rhythm. 2/6 MR murmur Lungs: clear Abdomen: obese, soft, nondistened Extremities: no edema Neuro: alert & orientedx3. Affect pleasant   Telemetry  SR 60s  Labs    CBC No results for input(s): WBC, NEUTROABS, HGB, HCT, MCV, PLT in the last 72 hours.  Basic Metabolic Panel Recent Labs    90/69/74 0425 03/03/24 0400  NA 138 137  K 4.1 4.4  CL 107 106  CO2 21* 23  GLUCOSE 87 93  BUN 46* 46*  CREATININE 1.70* 1.59*  CALCIUM  9.0 9.0  MG 2.0 2.1   Liver Function Tests No results for input(s): AST, ALT, ALKPHOS, BILITOT, PROT, ALBUMIN in the last 72 hours. No results for input(s): LIPASE, AMYLASE in the last 72 hours. Cardiac Enzymes No results for input(s):  CKTOTAL, CKMB, CKMBINDEX, TROPONINI in the last 72 hours.  BNP: BNP (last 3 results) Recent Labs    01/02/24 1504 02/17/24 1610  BNP 119.1* 856.6*    ProBNP (last 3 results) No results for input(s): PROBNP in the last 8760 hours.   D-Dimer No results for input(s): DDIMER in the last 72 hours. Hemoglobin A1C No results for input(s): HGBA1C in the last 72 hours. Fasting Lipid Panel No results for input(s): CHOL, HDL, LDLCALC, TRIG, CHOLHDL, LDLDIRECT in the last 72 hours. Thyroid Function Tests No results for input(s): TSH, T4TOTAL, T3FREE, THYROIDAB in the last 72 hours.  Invalid input(s): FREET3  Other results:   Imaging    No results found.    Medications:     Scheduled Medications:  amiodarone   200 mg Oral BID   apixaban   5 mg Oral BID   ARIPiprazole   5 mg Oral Daily   atorvastatin   40 mg Oral Daily   busPIRone   10 mg Oral BID   carvedilol   3.125 mg Oral BID WC   Chlorhexidine  Gluconate Cloth  6 each Topical Daily   dapagliflozin propanediol  10 mg Oral Daily   digoxin   0.125 mg Oral Daily   fluticasone   2 spray Each Nare Daily   lamoTRIgine   25 mg Oral BID   sacubitril -valsartan   1 tablet Oral BID   sertraline   50 mg Oral Daily  sodium chloride  flush  10-40 mL Intracatheter Q12H    Infusions:    PRN Medications: acetaminophen , alum & mag hydroxide-simeth, sodium chloride , sodium chloride  flush    Patient Profile   58 y/o female w/ prior h/o NICM w/ improved EF, hypertension, obesity and CKD III, admitted w/ acute CHF w/ low output. Found to have severe MR on w/u. Hospital course further c/b new AFL.   Assessment/Plan   1. Acute on chronic systolic CHF: Nonischemic cardiomyopathy.  EF has fluctuated from decreased to normal range since 2016.  TEE this admission showed EF 35-40%, moderate LV dilation, moderate RV dysfunction, and severe MR that appeared functional. LHC/RHC this admission showed nonobstructive  CAD and significantly elevated right and left heart filling pressures with low CI 1.7. Limited echo after diuresis and in NSR 9/30: EF 45-50%, RV okay, moderate to severe MR.  - She was diuresed and developed AKI, GDMT scaled back and diuretic stopped.  CO-OX 77% today - CVP 2. Does not need diuretic today. - Continue Entresto  24/26 bid.  - Continue Farxiga 10 daily.  - Continue Coreg  3.125 mg bid.  - Can stop digoxin  with EF up to 45-50%.  - Add spiro 12.5 mg daily 2. Atrial flutter: AFL was first noted this admission.  Mitral regurgitation will make this harder to manage.  S/p DCCV to SR. Current maintaining SR. - Continue po amiodarone .  - Continue Eliquis .  3. Mitral regurgitation: This appears severe and functional on TEE.  Noted from prior echoes when EF is low, improves when EF is in normal range.  It may be that we can decrease the mitral regurgitation by diuresis, resumption of NSR, and afterload reduction/GDMT titration.  - Repeat limited echo 9/30 w/ LVEF 45-50%, RV okay and moderate to severe MR - Will need to follow closely 5. CAD: Nonobstructive on cath today.  - No ASA with Eliquis  use.  - Add statin.  6. AKI on CKD stage 3: Creatinine slightly lower today at 1.6.  7. Depression/anxiety: See med list for psych meds.  QTc mildly prolonged at baseline, follow closely.   Will discuss discharge home today with Dr. Rolan. Has f/u scheduled in HF clinic. HF meds to [facility] at discharge. HF pharmD assisting with future refills through mail order.  HF meds at discharge: Amiodarone  200 mg BID until f/u then 200 mg daily Apixaban  5 mg BID Atorvastatin  40 mg daily Coreg  3.125 mg BID Farxiga 10 mg daily Entresto  24/26 mg BID Spiro 12.5 mg daily Lasix  40 mg PRN  Potassium chloride 40 mEq PRN with furosemide   FINCH, LINDSAY N 03/03/2024 11:00 AM  Patient seen with PA, I formulated the plan and agree with the above note.   CVP 5 today, co-ox 77%. Creatinine trending down, 1.59  today.  BP stable, Entresto  started.   Echo yesterday showed some improvement, EF 45-50% with normal RV and moderate-severe MR.    No complaints, wants to go home.   General: NAD Neck: No JVD, no thyromegaly or thyroid nodule.  Lungs: Clear to auscultation bilaterally with normal respiratory effort. CV: Nondisplaced PMI.  Heart regular S1/S2, no S3/S4, 1/6 HSM apex.  No peripheral edema.  .  Abdomen: Soft, nontender, no hepatosplenomegaly, no distention.  Skin: Intact without lesions or rashes.  Neurologic: Alert and oriented x 3.  Psych: Normal affect. Extremities: No clubbing or cyanosis.  HEENT: Normal.   Patient is euvolemic, good co-ox, creatinine trending down.  Echo yesterday showed improved EF to 45-50% and moderate-severe MR.   -  Add spironolactone  12.5 daily and continue other GDMT.   - Lasix  can be used prn.   - I think she can stop digoxin .   Will follow MR over time, may need to be addressed in future.   Ezra Shuck 03/03/2024 12:05 PM

## 2024-03-03 NOTE — Assessment & Plan Note (Signed)
 09/30 Echocardiogram with mildly reduced LV systolic function with EF 45 to 50%, global hypokinesis, mild to moderate dilatated LV cavity, RV systolic function preserved, moderate to severe mitral regurgitation, severe dilatation of LA and moderate dilatation RA. Trivial pericardial effusion.   09/24 cardiac catheterization  RA mean 15 to 17 mmHg RV 51/8 mean 17  PA 52/28 mean 38 mmHg  PCWP 38 mmHg   Pre and post capillary pulmonary hypertension.  Mild two vessel disease involving LAD and OM2   Patient was placed on IV furosemide , negative fluid balance was achieved, - 27,108 ml, with significant improvement in her symptoms.   Continue medical therapy with carvedilol , sacubitril -valsartan , spironolactone  and SGLT 2  Inh As needed furosemide .  Will need close follow up as outpatient.   Likely functional severe mitral regurgitation.

## 2024-03-03 NOTE — Telephone Encounter (Signed)
Clinical Questions Have Been Submitted

## 2024-03-03 NOTE — Assessment & Plan Note (Addendum)
 Sp direct current cardioversion, now on sinus rhythm.  Plan to continue rate control with carvedilol  and rhythm control with amiodarone  (loading dose 200 mg bid until outpatient follow up).  Anticoagulation with apixaban .   EKG from 09/26 with improved qtc prolongation down to 484 ms.

## 2024-03-03 NOTE — Assessment & Plan Note (Signed)
 Calculated BMI is 46.3

## 2024-03-03 NOTE — Discharge Summary (Addendum)
**Note Abigail-Identified via Obfuscation**  Physician Discharge Summary   Patient: Abigail Sharp MRN: [id number] DOB: 11/29/1965  Admit date:     02/17/2024  Discharge date: 03/03/24  Discharge Physician: Abigail Sharp   PCP: Abigail Sharp   Recommendations at discharge:   Patient has been placed on guideline directed medical therapy for heart failure with carvedilol  (reduced dose), SGLT 2 inh, entresto  and spironolactone  (reduced dose) As needed loop diuretic with furosemide  for volume overload, weight increase 2 to 3 lbs in 24 hrs or 5 lbs in 7 days.  Continue oral amiodarone  load 200 mg bid until follow up then plan to reduce to 200 mg. Anticoagulation with apixaban .  Started on statin for coronary artery disease, no antiplatelet therapy to reduced risk of bleeding.  Decreased sertraline  to 50 mg due to prolongation of qtc.  Follow up renal function and electrolytes in 7 days as outpatient Follow up with primary care Abigail Sharp in 7 to 10 days Follow up with Cardiology as scheduled.   Discharge Diagnoses: Principal Problem:   Acute heart failure with preserved ejection fraction (HFpEF) (HCC) Active Problems:   Acute on chronic heart failure with mildly reduced ejection fraction (HFmrEF, 41-49%) (HCC)   QT prolongation   Nonrheumatic mitral valve regurgitation   Atypical chest pain   Elevated troponin   AKI (acute kidney injury)   Paroxysmal atrial flutter (HCC)  Resolved Problems:   * No resolved hospital problems. [place] Hospital Course: Abigail Sharp was admitted to the hospital with the working diagnosis of heart failure exacerbation.   58 year old with a history of HTN and CHF who presented to the with chest pain and dyspnea.  Reported several moths of worsening dyspnea and abdominal distention. Apparently she was not compliant with her medications at home.  On her initial physical examination her blood pressure was 160/110 mmHg, HR 81, RR 24 and 02 saturation 100%  Lungs with coarse breath sounds  bilaterally, no wheezing, heart with S1 and S2 present and regular with no gallops or rubs, abdomen soft and non tender, trace lower extremity edema.   Na 141, K 4.5 Cl 110 bicarbonate 21, glucose 100 bun 28 cr 1,61  AST 23 ALT 24  BNP 856  High sensitive troponin 26 and 29  Wbc 6.1 hgb 11.2 plt 235   Urine analysis SG 1,012, protein 30, leukocytes negative, hgb negative   Chest radiograph with hypoinflation, positive cardiomegaly, with bilateral hilar vascular congestion, bilateral small pleural effusions.   EKG 98 bpm, right axis deviation, qtc 528, sinus rhythm with bilateral atrial enlargement, poor RR wave progression, no significant ST segment or  T wave changes.   Patient was placed on furosemide  for diuresis.    Echocardiogram with EF of 40-45% with moderate to severe MR.   09/22 TEE LV EF 35 to 40%, severe mitral valve regurgitation.   09/24 Left and right heart cath noted nonobstructive coronary artery disease and significantly elevated right and left heart filling pressure. Placed on guideline directed medical therapy for heart failure, including digoxin .  09/26 direct current cardioversion, converting to sinus rhythm.  10/01 improved volume status, plan for outpatient follow up.   Assessment and Plan: * Acute on chronic diastolic CHF (congestive heart failure) (HCC) 09/30 Echocardiogram with mildly reduced LV systolic function with EF 45 to 50%, global hypokinesis, mild to moderate dilatated LV cavity, RV systolic function preserved, moderate to severe mitral regurgitation, severe dilatation of LA and moderate dilatation RA. Trivial pericardial effusion.   09/24 cardiac  catheterization  RA mean 15 to 17 mmHg RV 51/8 mean 17  PA 52/28 mean 38 mmHg  PCWP 38 mmHg   Pre and post capillary pulmonary hypertension.  Mild two vessel disease involving LAD and OM2   Patient was placed on IV furosemide , negative fluid balance was achieved, - 27,108 ml, with significant  improvement in her symptoms.   Continue medical therapy with carvedilol , sacubitril -valsartan , spironolactone  and SGLT 2  Inh As needed furosemide .  Will need close follow up as outpatient.   Likely functional severe mitral regurgitation.    Paroxysmal atrial flutter (HCC) Sp direct current cardioversion, now on sinus rhythm.  Plan to continue rate control with carvedilol  and rhythm control with amiodarone  (loading dose 200 mg bid until outpatient follow up).  Anticoagulation with apixaban .   EKG from 09/26 with improved qtc prolongation down to 484 ms.   Essential hypertension Continue blood pressure control with entresto , carvedilol  and spironolactone    Coronary artery disease Mild 2 vessel coronary artery disease, involving LAD and OM2 Proximal LAD to mid LAD lesion 50% stenosed with 50% stenosed side branch in 1st diagonal. Distal LAD lesion 40% stenosed. Distal Cx lesion 30% stenosed, 2nd mrg lesion is 40% stenosed.   Recommendation to continue monotherapy with apixaban  and hold on antiplatelet therapy to decrease risk of bleeding.  Continue statin therapy 40 mg atorvastatin .   Chronic kidney disease, stage 3b (HCC) AKI.   A the time of discharge renal function with serum cr at 1,59 with K at 4.4 and serum bicarbonate at 23  Na 137 and Mg 2.1   Plan to continue spironolactone , and SGLT 2 inh.  As needed furosemide  Follow up renal function and electrolytes as outpatient.   Depression Continue aripiprazole , buspirone  and lamotrigine . Reduced dose of sertraline  due to qtc prolongation.   Class 3 obesity (HCC) Calculated BMI is 46.3       Consultants: Cardiology  Procedures performed: right and left cardiac catheterization, direct current cardioversion   Disposition: Home Diet recommendation:  Cardiac diet DISCHARGE MEDICATION: Allergies as of 03/03/2024       Reactions   Sulfa Antibiotics Other (See Comments)   Childhood allergy with unknown reaction         Medication List     STOP taking these medications    aspirin  EC 81 MG tablet   cephALEXin  500 MG capsule Commonly known as: KEFLEX        TAKE these medications    amiodarone  200 MG tablet Commonly known as: PACERONE  Take 1 tablet (200 mg total) by mouth 2 (two) times daily.   apixaban  5 MG Tabs tablet Commonly known as: ELIQUIS  Take 1 tablet (5 mg total) by mouth 2 (two) times daily.   ARIPiprazole  5 MG tablet Commonly known as: ABILIFY  Take 1 tablet (5 mg total) by mouth daily.   atorvastatin  40 MG tablet Commonly known as: LIPITOR Take 1 tablet (40 mg total) by mouth daily. Start taking on: March 04, 2024   busPIRone  10 MG tablet Commonly known as: BUSPAR  Take 1 tablet (10 mg total) by mouth 2 (two) times daily.   carvedilol  3.125 MG tablet Commonly known as: COREG  Take 1 tablet (3.125 mg total) by mouth 2 (two) times daily with a meal. What changed:  medication strength how much to take   dapagliflozin propanediol 10 MG Tabs tablet Commonly known as: FARXIGA Take 1 tablet (10 mg total) by mouth daily. Start taking on: March 04, 2024   furosemide  40 MG tablet Commonly known  as: LASIX  Take 1 tablet (40 mg total) by mouth daily as needed for fluid or edema (take in case of weight gain 2 to 3 lbs in 24 hrs or 5 lbs in seven days).   lamoTRIgine  25 MG tablet Commonly known as: LaMICtal  Take 1 tablet (25 mg total) by mouth 2 (two) times daily.   potassium chloride SA 20 MEQ tablet Commonly known as: KLOR-CON M Take 2 tablets (40 mEq total) by mouth daily as needed (take only with furosemide ). Start taking on: March 04, 2024   sacubitril -valsartan  24-26 MG Commonly known as: ENTRESTO  Take 1 tablet by mouth 2 (two) times daily.   sertraline  50 MG tablet Commonly known as: ZOLOFT  Take 1 tablet (50 mg total) by mouth daily. Start taking on: March 04, 2024 What changed:  medication strength how much to take   spironolactone  25 MG  tablet Commonly known as: ALDACTONE  Take 0.5 tablets (12.5 mg total) by mouth daily. What changed: how much to take               Durable Medical Equipment  (From admission, onward)           Start     Ordered   03/02/24 1600  For home use only DME Hospital bed  Once       Comments: Therapeutic mattress  Question Answer Comment  Length of Need Lifetime   Patient has (list medical condition): CHF   The above medical condition requires: Patient requires the ability to reposition immediately   Head must be elevated greater than: 45 degrees   Bed type Semi-electric   Trapeze Bar Yes      03/02/24 1600   03/02/24 1554  For home use only DME lightweight manual wheelchair with seat cushion  Once       Comments: Patient suffers from CHF, physical deconditioning which impairs their ability to perform daily activities like bathing, dressing, feeding, grooming, and toileting in the home.  A cane, crutch, or walker will not resolve  issue with performing activities of daily living. A wheelchair will allow patient to safely perform daily activities. Patient is not able to propel themselves in the home using a standard weight wheelchair due to arm weakness, endurance, and general weakness. Patient can self propel in the lightweight wheelchair. Length of need Lifetime. Accessories: elevating leg rests (ELRs), wheel locks, extensions and anti-tippers, back cushion. Bariatric, weight 122.7 kg, 5'4   03/02/24 1600            Follow-up Information     Abigail Sharp Follow up.   Specialty: Family Medicine Why: appt scheduled for 03/05/2024 at 8:10 am Contact information: [address] [place] [address] [phone]         [facility] Follow up.   Specialty: Home Health Services Why: Home Health RN-agency will call with appt Contact information: [address] [place] [address] [phone]         [employer] [facility] Follow up.   Why: hospital bed (delivered to home), wheelchair (hospital room) Contact information: [phone]               Discharge Exam: Filed Weights   03/01/24 0618 03/02/24 0258 03/03/24 0500  Weight: 122.5 kg 122.7 kg 122.7 kg   BP (!) 144/70   Pulse 64   Temp 97.8 F (36.6 C) (Oral)   Resp 16   Ht 5' 4 (1.626 m)   Wt 122.7  kg   LMP 03/29/2016   SpO2 99%   BMI 46.43 kg/m   Patient is feeling better, has no dyspnea, chest pain, abdominal distention, peripheral edema, orthopnea or PND  Neurology awake and alert ENT with mild pallor Cardiovascular with S1 and S2 present and regular with no gallops or rubs, positive systolic murmur at the apex No JVD Respiratory with no rales or wheezing, no rhonchi  Abdomen with no distention, soft and non tender No lower extremity edema   Condition at discharge: stable  The results of significant diagnostics from this hospitalization (including imaging, microbiology, ancillary and laboratory) are listed below for reference.   Imaging Studies: ECHOCARDIOGRAM LIMITED Result Date: 03/02/2024    ECHOCARDIOGRAM LIMITED REPORT   Patient Name:   Abigail Sharp Date of Exam: 03/02/2024 Medical Rec #:  [id number]     Height:       64.0 in Accession #:    [id number]    Weight:       270.5 lb Date of Birth:  1965/09/20      BSA:          2.224 m Patient Age:    58 years      BP:           137/66 mmHg Patient Gender: F             HR:           62 bpm. Exam Location:  Inpatient Procedure: Limited Echo, Limited Color Doppler and Cardiac Doppler (Both            Spectral and Color Flow Doppler were utilized during procedure). Indications:    Mitral Valve disorder I05.9  History:        Patient has prior history of Echocardiogram examinations, most                 recent 02/23/2024. CHF and Cardiomyopathy; Risk                 Factors:Pre-diabetes and Hypertension.  Sonographer:    Koleen Popper RDCS Referring Phys: [phone] EZRA RAMAN [facility]   Sonographer Comments: Patient is obese. IMPRESSIONS  1. Left ventricular ejection fraction, by estimation, is 45 to 50%. The left ventricle has mildly decreased function. The left ventricle demonstrates global hypokinesis. The left ventricular internal cavity size was mildly to moderately dilated. Left ventricular diastolic parameters are indeterminate.  2. Right ventricular systolic function is normal. There is normal pulmonary artery systolic pressure.  3. Left atrial size was mildly dilated.  4. The mitral valve is normal in structure. Moderate to severe mitral valve regurgitation.  5. The aortic valve was not assessed.  6. The inferior vena cava is normal in size with greater than 50% respiratory variability, suggesting right atrial pressure of 3 mmHg. Comparison(s): A prior study was performed on 02/19/2024. Prior images reviewed side by side. The left ventricular function slightly improved. RVSP was 40 mmHg, there was a severely dilated left atrium and moderately dilated right atrium. FINDINGS  Left Ventricle: Left ventricular ejection fraction, by estimation, is 45 to 50%. The left ventricle has mildly decreased function. The left ventricle demonstrates global hypokinesis. The left ventricular internal cavity size was mildly to moderately dilated. There is borderline left ventricular hypertrophy. Left ventricular diastolic parameters are indeterminate. Left ventricular diastolic function could not be evaluated due to mitral regurgitation (moderate or greater). Right Ventricle: Right ventricular systolic function is normal. There is normal pulmonary artery systolic pressure. The tricuspid regurgitant velocity  is 2.24 m/s, and with an assumed right atrial pressure of 3 mmHg, the estimated right ventricular systolic pressure is 23.1 mmHg. Left Atrium: Left atrial size was mildly dilated. Pericardium: Trivial pericardial effusion is present. Mitral Valve: The mitral valve is normal in structure. Moderate to  severe mitral valve regurgitation. MV peak gradient, 9.1 mmHg. The mean mitral valve gradient is 3.0 mmHg. Tricuspid Valve: The tricuspid valve is normal in structure. Tricuspid valve regurgitation is trivial. Aortic Valve: The aortic valve was not assessed. Aorta: The aortic root and ascending aorta are structurally normal, with no evidence of dilitation. Venous: The inferior vena cava is normal in size with greater than 50% respiratory variability, suggesting right atrial pressure of 3 mmHg. Additional Comments: Spectral Doppler performed. Color Doppler performed.  LEFT VENTRICLE PLAX 2D LVIDd:         6.28 cm      Diastology LVIDs:         4.10 cm      LV e' medial:    8.05 cm/s LV PW:         1.32 cm      LV E/e' medial:  14.3 LV IVS:        1.04 cm      LV e' lateral:   11.20 cm/s LVOT diam:     1.83 cm      LV E/e' lateral: 10.3 LVOT Area:     2.63 cm  LV Volumes (MOD) LV vol d, MOD A4C: 185.0 ml LV vol s, MOD A4C: 68.2 ml LV SV MOD A4C:     185.0 ml RIGHT VENTRICLE             IVC RV S prime:     13.50 cm/s  IVC diam: 2.03 cm TAPSE (M-mode): 2.6 cm LEFT ATRIUM             Index LA diam:        4.56 cm 2.05 cm/m LA Vol (A2C):   80.2 ml 36.06 ml/m LA Vol (A4C):   63.3 ml 28.46 ml/m LA Biplane Vol: 71.4 ml 32.10 ml/m   AORTA Ao Root diam: 2.97 cm Ao Asc diam:  2.99 cm MITRAL VALVE                  TRICUSPID VALVE MV Area (PHT): 4.21 cm       TR Peak grad:   20.1 mmHg MV Peak grad:  9.1 mmHg       TR Vmax:        224.00 cm/s MV Mean grad:  3.0 mmHg MV Vmax:       1.51 m/s       SHUNTS MV Vmean:      71.6 cm/s      Systemic Diam: 1.83 cm MV VTI:        1.85 m MV Decel Time: 180 msec MR Peak grad:    121.4 mmHg MR Mean grad:    76.0 mmHg MR Vmax:         551.00 cm/s MR Vmean:        413.0 cm/s MR PISA Nyquist: 0.4 m/s MR PISA:         0.26 cm MR PISA Radius:  0.76 cm MV E velocity: 115.00 cm/s MV A velocity: 40.40 cm/s MV E/A ratio:  2.85 [name] Calender Electronically signed by [name] Calender Signature Date/Time:  03/02/2024/1:30:46 PM    Final    EP STUDY Result Date: 02/29/2024 See surgical note for  result.  DG Chest Port 1 View Result Date: 02/26/2024 EXAM: 1 VIEW(S) XRAY OF THE CHEST 02/26/2024 12:03:00 PM COMPARISON: 02/17/2024 CLINICAL HISTORY: S/P PICC central line placement FINDINGS: LINES, TUBES AND DEVICES: Right PICC in place with tip at superior cavoatrial junction. LUNGS AND PLEURA: New platelet atelectasis in the left mid lung. No pulmonary edema. No pleural effusion. No pneumothorax. HEART AND MEDIASTINUM: No acute abnormality of the cardiac and mediastinal silhouettes. BONES AND SOFT TISSUES: No acute osseous abnormality. IMPRESSION: 1. Right upper extremity PICC line with tip at the superior cavoatrial junction, appropriately positioned. 2. New plate-like atelectasis in the left mid lung. Electronically signed by: Waddell Calk Sharp 02/26/2024 12:19 PM EDT RP Workstation: [id number]   US  EKG SITE RITE Result Date: 02/25/2024 If Site Rite image not attached, placement could not be confirmed due to current cardiac rhythm.  CARDIAC CATHETERIZATION Result Date: 02/25/2024 Table formatting from the original result was not included. Images from the original result were not included. Dominance: Right  Mild two-vessel disease involving the LAD and OM 2  Prox LAD to Mid LAD lesion is 50% stenosed with 50% stenosed side branch in 1st Diag. Dist LAD lesion is 40% stenosed. Dist Cx lesion is 30% stenosed. 2nd Mrg lesion is 40% stenosed. Moderate pulmonary pretension with mean PAP 38 mmHg and PCWP 38 mmHg and LVEDP of 29 mmHg. Prominent berry is noted, but not giant. RECOMMENDATIONS   With significant elevated PA pressures and wedge pressure, she was given 40 mg IV Lasix  in the Cath Lab and will be started on 40 mg twice daily starting the morning of 02/26/2024   With CAD, may benefit from aspirin  but since she will be on DOAC for atrial flutter/fibrillation, would simply treat with monotherapy for now.  She will  be restarted on IV heparin  2 hours post TR band removal.  Decision on when to restart DOAC will be based on the next Epson her preprocedural evaluation. Alm Clay, Sharp  ECHO TEE Result Date: 02/23/2024    TRANSESOPHOGEAL ECHO REPORT   Patient Name:   Abigail Sharp Date of Exam: 02/23/2024 Medical Rec #:  [id number]     Height:       64.0 in Accession #:    [id number]    Weight:       275.6 lb Date of Birth:  1965/10/29      BSA:          2.242 m Patient Age:    58 years      BP:           153/95 mmHg Patient Gender: F             HR:           75 bpm. Exam Location:  Inpatient Procedure: Transesophageal Echo, 3D Echo, Cardiac Doppler, Color Doppler and            Saline Contrast Bubble Study (Both Spectral and Color Flow Doppler            were utilized during procedure). Indications:     I34.0 Nonrheumatic mitral (valve) insufficiency  History:         Patient has prior history of Echocardiogram examinations, most                  recent 02/19/2024.  Sonographer:     Ellouise Mose RDCS Referring Phys:  [id number] LEONTINE SAILOR LOCKWOOD Diagnosing Phys: Lonni Nanas Sharp PROCEDURE: After discussion of the risks and benefits of a  TEE, an informed consent was obtained from the patient. The transesophogeal probe was passed without difficulty through the esophogus of the patient. Imaged were obtained with the patient in a left lateral decubitus position. Sedation performed by different physician. The patient was monitored while under deep sedation. Anesthestetic sedation was provided intravenously by Anesthesiology: 450mg  of Propofol . The patient's vital signs; including heart rate, blood pressure, and oxygen saturation; remained stable throughout the procedure. The patient developed no complications during the procedure.  IMPRESSIONS  1. Left ventricular ejection fraction, by estimation, is 35 to 40%. The left ventricle has moderately decreased function. The left ventricle demonstrates global hypokinesis. The left  ventricular internal cavity size was moderately dilated.  2. Right ventricular systolic function is moderately reduced. The right ventricular size is mildly enlarged.  3. Left atrial size was moderately dilated. No left atrial/left atrial appendage thrombus was detected.  4. Right atrial size was mildly dilated.  5. A small pericardial effusion is present.  6. The aortic valve is tricuspid. Aortic valve regurgitation is not visualized. No aortic stenosis is present.  7. Evidence of atrial level shunting detected by color flow Doppler. Agitated saline contrast bubble study was positive with shunting observed within 3-6 cardiac cycles suggestive of interatrial shunt. There is a small patent foramen ovale.  8. 3D performed of the mitral valve and demonstrates severe MR.  9. The mitral valve is abnormal. Severe mitral valve regurgitation. MR appears functional. There are 2 central MR jets, with total 3D VCA 0.5cm^2; in addition, there is RLPV systolic flow reversal. This is consistent with severe MR FINDINGS  Left Ventricle: Left ventricular ejection fraction, by estimation, is 35 to 40%. The left ventricle has moderately decreased function. The left ventricle demonstrates global hypokinesis. The left ventricular internal cavity size was moderately dilated. Right Ventricle: The right ventricular size is mildly enlarged. No increase in right ventricular wall thickness. Right ventricular systolic function is moderately reduced. Left Atrium: Left atrial size was moderately dilated. No left atrial/left atrial appendage thrombus was detected. Right Atrium: Right atrial size was mildly dilated. Pericardium: A small pericardial effusion is present. Mitral Valve: The mitral valve is abnormal. Severe mitral valve regurgitation. Tricuspid Valve: The tricuspid valve is normal in structure. Tricuspid valve regurgitation is mild . No evidence of tricuspid stenosis. Aortic Valve: The aortic valve is tricuspid. Aortic valve  regurgitation is not visualized. No aortic stenosis is present. Pulmonic Valve: The pulmonic valve was grossly normal. Pulmonic valve regurgitation is mild. No evidence of pulmonic stenosis. Aorta: The aortic root and ascending aorta are structurally normal, with no evidence of dilitation. IAS/Shunts: Evidence of atrial level shunting detected by color flow Doppler. Agitated saline contrast was given intravenously to evaluate for intracardiac shunting. Agitated saline contrast bubble study was positive with shunting observed within 3-6 cardiac cycles suggestive of interatrial shunt. A small patent foramen ovale is detected. Additional Comments: 3D was performed not requiring image post processing on an independent workstation and was abnormal. Spectral Doppler performed. MV E velocity: 94.00 cm/s Lonni Nanas Sharp Electronically signed by Lonni Nanas Sharp Signature Date/Time: 02/23/2024/1:59:49 PM    Final    EP STUDY Result Date: 02/23/2024 See surgical note for result.  US  RENAL Result Date: 02/20/2024 CLINICAL DATA:  204091 CKD (chronic kidney disease) stage 4, GFR 15-29 ml/min (HCC) [id number] EXAM: RENAL / URINARY TRACT ULTRASOUND COMPLETE COMPARISON:  None Available. FINDINGS: Right Kidney: Renal measurements: 10.2 x 3.4 x 5.1 cm = volume: 92.3 mL.Normal echogenicity. No mass. No hydronephrosis  or nephrolithiasis. Left Kidney: Renal measurements: 7.8 x 4.1 x 5.1 cm = volume: 85 mL. Normal echogenicity. No mass. No hydronephrosis or nephrolithiasis. Bladder: Appears normal for degree of bladder distention. Other: None. IMPRESSION: No hydronephrosis or nephrolithiasis. Electronically Signed   By: Rogelia Myers M.D.   On: 02/20/2024 15:59   ECHOCARDIOGRAM COMPLETE Result Date: 02/20/2024    ECHOCARDIOGRAM REPORT   Patient Name:   Abigail Sharp Date of Exam: 02/19/2024 Medical Rec #:  [id number]     Height:       64.0 in Accession #:    [id number]    Weight:       284.4 lb Date of Birth:  07/15/1965       BSA:          2.272 m Patient Age:    58 years      BP:           113/70 mmHg Patient Gender: F             HR:           75 bpm. Exam Location:  Inpatient Procedure: 2D Echo, Cardiac Doppler, Color Doppler and Intracardiac            Opacification Agent (Both Spectral and Color Flow Doppler were            utilized during procedure). Indications:    CHF I50.9  History:        Patient has prior history of Echocardiogram examinations, most                 recent 12/15/2020. Cardiomyopathy and CHF; Risk                 Factors:Hypertension.  Sonographer:    Thea Norlander RCS Referring Phys: [id number] ZANE ADAMS IMPRESSIONS  1. Left ventricular ejection fraction, by estimation, is 40 to 45%. The left ventricle has mildly decreased function. The left ventricle demonstrates global hypokinesis. There is mild left ventricular hypertrophy. Elevated left ventricular end-diastolic  pressure.  2. Right ventricular systolic function is normal. The right ventricular size is normal. There is mildly elevated pulmonary artery systolic pressure.  3. Left atrial size was severely dilated.  4. Right atrial size was moderately dilated.  5. Consider TEE to better evaluate mitral regurgitation. The mitral valve is normal in structure. Moderate to severe mitral valve regurgitation. No evidence of mitral stenosis.  6. The aortic valve is tricuspid. Aortic valve regurgitation is not visualized. No aortic stenosis is present.  7. The inferior vena cava is normal in size with greater than 50% respiratory variability, suggesting right atrial pressure of 3 mmHg. FINDINGS  Left Ventricle: Left ventricular ejection fraction, by estimation, is 40 to 45%. The left ventricle has mildly decreased function. The left ventricle demonstrates global hypokinesis. Definity  contrast agent was given IV to delineate the left ventricular  endocardial borders. The left ventricular internal cavity size was normal in size. There is mild left ventricular  hypertrophy. Left ventricular diastolic function could not be evaluated due to mitral regurgitation (moderate or greater). Elevated left ventricular end-diastolic pressure. Right Ventricle: The right ventricular size is normal. No increase in right ventricular wall thickness. Right ventricular systolic function is normal. There is mildly elevated pulmonary artery systolic pressure. The tricuspid regurgitant velocity is 2.51  m/s, and with an assumed right atrial pressure of 15 mmHg, the estimated right ventricular systolic pressure is 40.2 mmHg. Left Atrium: Left atrial size was severely dilated. Right Atrium: Right  atrial size was moderately dilated. Pericardium: There is no evidence of pericardial effusion. Mitral Valve: Consider TEE to better evaluate mitral regurgitation. The mitral valve is normal in structure. Moderate to severe mitral valve regurgitation. No evidence of mitral valve stenosis. Tricuspid Valve: The tricuspid valve is normal in structure. Tricuspid valve regurgitation is mild . No evidence of tricuspid stenosis. Aortic Valve: The aortic valve is tricuspid. Aortic valve regurgitation is not visualized. No aortic stenosis is present. Aortic valve peak gradient measures 10.0 mmHg. Pulmonic Valve: The pulmonic valve was normal in structure. Pulmonic valve regurgitation is mild. No evidence of pulmonic stenosis. Aorta: The aortic root is normal in size and structure. Venous: The inferior vena cava is normal in size with greater than 50% respiratory variability, suggesting right atrial pressure of 3 mmHg. IAS/Shunts: No atrial level shunt detected by color flow Doppler.  LEFT VENTRICLE PLAX 2D LVIDd:         5.90 cm   Diastology LVIDs:         4.40 cm   LV e' medial:    6.42 cm/s LV PW:         1.20 cm   LV E/e' medial:  17.4 LV IVS:        0.90 cm   LV e' lateral:   10.00 cm/s LVOT diam:     1.93 cm   LV E/e' lateral: 11.2 LV SV:         60 LV SV Index:   27 LVOT Area:     2.94 cm  RIGHT VENTRICLE             IVC RV S prime:     5.85 cm/s  IVC diam: 3.00 cm LEFT ATRIUM            Index        RIGHT ATRIUM           Index LA diam:      4.60 cm  2.02 cm/m   RA Area:     23.85 cm LA Vol (A2C): 84.2 ml  37.06 ml/m  RA Volume:   76.05 ml  33.47 ml/m LA Vol (A4C): 123.0 ml 54.13 ml/m  AORTIC VALVE AV Area (Vmax): 2.06 cm AV Vmax:        158.00 cm/s AV Peak Grad:   10.0 mmHg LVOT Vmax:      111.00 cm/s LVOT Vmean:     74.400 cm/s LVOT VTI:       0.206 m  AORTA Ao Root diam: 2.70 cm Ao Asc diam:  2.90 cm MITRAL VALVE                TRICUSPID VALVE MV Area (PHT): 4.60 cm     TR Peak grad:   25.2 mmHg MV Decel Time: 165 msec     TR Vmax:        251.00 cm/s MV E velocity: 112.00 cm/s MV A velocity: 38.80 cm/s   SHUNTS MV E/A ratio:  2.89         Systemic VTI:  0.21 m                             Systemic Diam: 1.93 cm Annabella Scarce Sharp Electronically signed by Annabella Scarce Sharp Signature Date/Time: 02/20/2024/7:10:27 AM    Final    DG Chest 2 View Result Date: 02/17/2024 CLINICAL DATA:  Shortness of breath.  Chest pain. EXAM: CHEST - 2  VIEW COMPARISON:  01/02/2024 FINDINGS: Mild cardiomegaly. Vascular congestion without edema. Possible tiny pleural effusions. No confluent opacity. No pneumothorax. On limited assessment, no acute osseous findings. IMPRESSION: Mild cardiomegaly with vascular congestion. Possible tiny pleural effusions. Electronically Signed   By: Andrea Gasman M.D.   On: 02/17/2024 16:04    Microbiology: Results for orders placed or performed during the hospital encounter of 01/02/24  Resp panel by RT-PCR (RSV, Flu A&B, Covid) Anterior Nasal Swab     Status: None   Collection Time: 01/02/24 10:06 PM   Specimen: Anterior Nasal Swab  Result Value Ref Range Status   SARS Coronavirus 2 by RT PCR NEGATIVE NEGATIVE Final   Influenza A by PCR NEGATIVE NEGATIVE Final   Influenza B by PCR NEGATIVE NEGATIVE Final    Comment: (NOTE) The Xpert Xpress SARS-CoV-2/FLU/RSV plus assay is intended  as an aid in the diagnosis of influenza from Nasopharyngeal swab specimens and should not be used as a sole basis for treatment. Nasal washings and aspirates are unacceptable for Xpert Xpress SARS-CoV-2/FLU/RSV testing.  Fact Sheet for Patients: BloggerCourse.com  Fact Sheet for Healthcare Providers: SeriousBroker.it  This test is not yet approved or cleared by the [place]  FDA and has been authorized for detection and/or diagnosis of SARS-CoV-2 by FDA under an Emergency Use Authorization (EUA). This EUA will remain in effect (meaning this test can be used) for the duration of the COVID-19 declaration under Section 564(b)(1) of the Act, 21 U.S.C. section 360bbb-3(b)(1), unless the authorization is terminated or revoked.     Resp Syncytial Virus by PCR NEGATIVE NEGATIVE Final    Comment: (NOTE) Fact Sheet for Patients: BloggerCourse.com  Fact Sheet for Healthcare Providers: SeriousBroker.it  This test is not yet approved or cleared by the [place]  FDA and has been authorized for detection and/or diagnosis of SARS-CoV-2 by FDA under an Emergency Use Authorization (EUA). This EUA will remain in effect (meaning this test can be used) for the duration of the COVID-19 declaration under Section 564(b)(1) of the Act, 21 U.S.C. section 360bbb-3(b)(1), unless the authorization is terminated or revoked.  Performed at [facility], [address], [place], [place] [address]   Urine Culture     Status: Abnormal   Collection Time: 01/03/24 12:25 AM   Specimen: Urine, Clean Catch  Result Value Ref Range Status   Specimen Description URINE, CLEAN CATCH  Final   Special Requests   Final    NONE Performed at [facility], [address], [place], [place] [address]    Culture 70,000 COLONIES/mL ESCHERICHIA COLI (A)  Final   Report Status 01/05/2024 FINAL  Final    Organism ID, Bacteria ESCHERICHIA COLI (A)  Final      Susceptibility   Escherichia coli - MIC*    AMPICILLIN <=2 SENSITIVE Sensitive     CEFAZOLIN <=4 SENSITIVE Sensitive     CEFEPIME <=0.12 SENSITIVE Sensitive     CEFTRIAXONE <=0.25 SENSITIVE Sensitive     CIPROFLOXACIN <=0.25 SENSITIVE Sensitive     GENTAMICIN <=1 SENSITIVE Sensitive     IMIPENEM <=0.25 SENSITIVE Sensitive     NITROFURANTOIN <=16 SENSITIVE Sensitive     TRIMETH/SULFA <=20 SENSITIVE Sensitive     AMPICILLIN/SULBACTAM <=2 SENSITIVE Sensitive     PIP/TAZO <=4 SENSITIVE Sensitive ug/mL    * 70,000 COLONIES/mL ESCHERICHIA COLI    Labs: CBC: Recent Labs  Lab 02/25/24 1532 02/26/24 0905 02/27/24 0425 02/28/24 0310 02/29/24 0241  WBC  --  5.5 6.3 6.3  6.6  HGB 12.9  12.9 13.5 13.8 13.3 12.7  HCT 38.0  38.0 42.6 43.6 42.3 39.7  MCV  --  77.9* 78.0* 78.2* 77.1*  PLT  --  214 232 206 177   Basic Metabolic Panel: Recent Labs  Lab 02/28/24 0310 02/29/24 0241 03/01/24 0310 03/02/24 0425 03/03/24 0400  NA 138 138 138 138 137  K 4.2 4.1 4.2 4.1 4.4  CL 102 108 109 107 106  CO2 23 19* 21* 21* 23  GLUCOSE 106* 88 89 87 93  BUN 60* 55* 53* 46* 46*  CREATININE 2.14* 2.28* 1.99* 1.70* 1.59*  CALCIUM  8.9 8.7* 8.7* 9.0 9.0  MG  --   --   --  2.0 2.1   Liver Function Tests: No results for input(s): AST, ALT, ALKPHOS, BILITOT, PROT, ALBUMIN in the last 168 hours. CBG: Recent Labs  Lab 02/26/24 1316 02/27/24 1208  GLUCAP 89 87    Discharge time spent: greater than 30 minutes.  Signed: Elidia Toribio [place]ace, [facility] 03/03/2024

## 2024-03-03 NOTE — Assessment & Plan Note (Signed)
 AKI.   A the time of discharge renal function with serum cr at 1,59 with K at 4.4 and serum bicarbonate at 23  Na 137 and Mg 2.1   Plan to continue spironolactone , and SGLT 2 inh.  As needed furosemide  Follow up renal function and electrolytes as outpatient.

## 2024-03-03 NOTE — Telephone Encounter (Signed)
 Pharmacy Patient Advocate Encounter  Received notification from [facility] MEDICAID that Prior Authorization for Farxiga 10 mg tablets  has been APPROVED from 03/03/2024 to 03/03/2025   PA #/Case ID/Reference #: [id number]

## 2024-03-03 NOTE — TOC Transition Note (Signed)
 Transition of Care (TOC) - Discharge Note Rayfield Gobble RN, BSN Inpatient Care Management Unit 4E- RN Case Manager See Treatment Team for direct phone #   Patient Details  Name: Abigail Sharp MRN: [id number] Date of Birth: 10-27-1965  [facility]) CM/SW Contact:  Gobble Rayfield Hurst, RN Phone Number: 03/03/2024, 4:14 PM   Clinical Narrative:    Pt stable for transition home today, DME- wheelchair has been delivered- [employer] delivering hospital bed to the home.   CM notified [facility] liaison of discharge for start of care- anticipate visit tomorrow per liaison.   Family to transport home  No further IP CM needs noted.    Final next level of care: Home w Home Health Services Barriers to Discharge: Barriers Resolved   Patient Goals and CMS Choice Patient states their goals for this hospitalization and ongoing recovery are:: return home   Choice offered to / list presented to : Patient      Discharge Placement                 Home w/ [facility]      Discharge Plan and Services Additional resources added to the After Visit Summary for     Discharge Planning Services: CM Consult Post Acute Care Choice: Durable Medical Equipment, Home Health          DME Arranged: Lightweight manual wheelchair with seat cushion, Hospital bed DME Agency: [employer] Date DME Agency Contacted: 03/02/24 Time DME Agency Contacted: 1626 Representative spoke with at DME Agency: [place] HH Arranged: RN HH Agency: [facility] Date [facility] Agency Contacted: 03/02/24 Time HH Agency Contacted: 1626 Representative spoke with at [facility] Agency: [place]  Social Drivers of Health (SDOH) Interventions SDOH Screenings   Food Insecurity: No Food Insecurity (02/18/2024)  Housing: Low Risk  (02/18/2024)  Transportation Needs: No Transportation Needs (02/18/2024)  Utilities: Not At Risk (02/18/2024)  Depression (PHQ2-9): High Risk (06/19/2023)  Tobacco Use: Low Risk  (02/17/2024)      Readmission Risk Interventions    03/03/2024    3:16 PM  Readmission Risk Prevention Plan  Transportation Screening Complete  Home Care Screening Complete  Medication Review (RN CM) Complete

## 2024-03-05 ENCOUNTER — Other Ambulatory Visit (HOSPITAL_COMMUNITY): Payer: Self-pay

## 2024-03-05 ENCOUNTER — Ambulatory Visit (HOSPITAL_BASED_OUTPATIENT_CLINIC_OR_DEPARTMENT_OTHER): Payer: MEDICAID | Admitting: Family Medicine

## 2024-03-05 ENCOUNTER — Ambulatory Visit: Payer: MEDICAID | Admitting: Physician Assistant

## 2024-03-08 ENCOUNTER — Encounter (HOSPITAL_COMMUNITY): Payer: MEDICAID

## 2024-03-16 ENCOUNTER — Ambulatory Visit (HOSPITAL_BASED_OUTPATIENT_CLINIC_OR_DEPARTMENT_OTHER): Payer: MEDICAID | Admitting: Family Medicine

## 2024-03-23 ENCOUNTER — Telehealth: Payer: Self-pay | Admitting: Cardiology

## 2024-03-23 ENCOUNTER — Encounter (HOSPITAL_COMMUNITY): Payer: MEDICAID

## 2024-03-23 NOTE — Telephone Encounter (Signed)
 Spoke with pt regarding shortness of breath. Pt stated she is having shortness of breath with activity such as cooking or walking to the mailbox. Pt denies having any shortness of breath while resting. An appointment was made for 11/13 to see APP. Pt was unable to make an earlier appointment. ED precautions given. Pt verbalized understanding. All questions if any were answered.

## 2024-03-23 NOTE — Telephone Encounter (Signed)
 Pt c/o Shortness Of Breath: STAT if SOB developed within the last 24 hours or pt is noticeably SOB on the phone  1. Are you currently SOB (can you hear that pt is SOB on the phone)? No   2. How long have you been experiencing SOB? 2 weeks   3. Are you SOB when sitting or when up moving around? Moving around   4. Are you currently experiencing any other symptoms? No

## 2024-03-24 ENCOUNTER — Ambulatory Visit: Payer: MEDICAID | Admitting: Physician Assistant

## 2024-03-26 NOTE — Telephone Encounter (Signed)
 Patient was hospitalized for around 14 days since I have seen her -- please have her come in sooner to be evaluated - given her symptoms.   Dorothee Napierkowski [place], DO, FACC

## 2024-03-31 ENCOUNTER — Other Ambulatory Visit (HOSPITAL_COMMUNITY): Payer: Self-pay

## 2024-04-01 ENCOUNTER — Other Ambulatory Visit: Payer: Self-pay

## 2024-04-01 ENCOUNTER — Other Ambulatory Visit (HOSPITAL_COMMUNITY): Payer: Self-pay

## 2024-04-01 ENCOUNTER — Other Ambulatory Visit (HOSPITAL_BASED_OUTPATIENT_CLINIC_OR_DEPARTMENT_OTHER): Payer: Self-pay

## 2024-04-01 MED ORDER — BUSPIRONE HCL 10 MG PO TABS
10.0000 mg | ORAL_TABLET | Freq: Two times a day (BID) | ORAL | 0 refills | Status: AC
  Filled 2024-04-01: qty 60, 30d supply, fill #0

## 2024-04-01 MED ORDER — SPIRONOLACTONE 25 MG PO TABS
25.0000 mg | ORAL_TABLET | Freq: Every day | ORAL | 2 refills | Status: AC
  Filled 2024-04-01: qty 90, 90d supply, fill #0

## 2024-04-01 MED ORDER — SERTRALINE HCL 100 MG PO TABS
200.0000 mg | ORAL_TABLET | Freq: Every day | ORAL | 0 refills | Status: AC
  Filled 2024-04-01: qty 180, 90d supply, fill #0

## 2024-04-01 MED ORDER — LAMOTRIGINE 25 MG PO TABS
25.0000 mg | ORAL_TABLET | Freq: Two times a day (BID) | ORAL | 0 refills | Status: AC
  Filled 2024-04-01: qty 180, 90d supply, fill #0

## 2024-04-01 MED ORDER — ARIPIPRAZOLE 5 MG PO TABS
5.0000 mg | ORAL_TABLET | Freq: Every day | ORAL | 0 refills | Status: AC
  Filled 2024-04-01: qty 90, 90d supply, fill #0

## 2024-04-01 MED ORDER — CARVEDILOL 6.25 MG PO TABS
6.2500 mg | ORAL_TABLET | Freq: Two times a day (BID) | ORAL | 0 refills | Status: AC
  Filled 2024-04-01: qty 180, 90d supply, fill #0

## 2024-04-02 ENCOUNTER — Other Ambulatory Visit (HOSPITAL_COMMUNITY): Payer: Self-pay

## 2024-04-02 ENCOUNTER — Telehealth (HOSPITAL_COMMUNITY): Payer: Self-pay

## 2024-04-02 ENCOUNTER — Other Ambulatory Visit: Payer: Self-pay | Admitting: Cardiology

## 2024-04-02 ENCOUNTER — Telehealth: Payer: Self-pay | Admitting: Cardiology

## 2024-04-02 ENCOUNTER — Other Ambulatory Visit: Payer: Self-pay

## 2024-04-02 MED ORDER — AMIODARONE HCL 200 MG PO TABS
200.0000 mg | ORAL_TABLET | Freq: Two times a day (BID) | ORAL | 0 refills | Status: DC
  Filled 2024-04-02: qty 60, 30d supply, fill #0

## 2024-04-02 MED ORDER — DAPAGLIFLOZIN PROPANEDIOL 10 MG PO TABS
10.0000 mg | ORAL_TABLET | Freq: Every day | ORAL | 0 refills | Status: AC
  Filled 2024-04-02 – 2024-04-14 (×2): qty 90, 90d supply, fill #0

## 2024-04-02 NOTE — Telephone Encounter (Signed)
 Called to confirm/remind patient of their [facility] on 04/[facility].   Appointment:   [] Confirmed  [x] Left mess   [] No answer/No voice mail  [] VM Full/unable to leave message  [] Phone not in service  And to bring in all medications and/or complete list.

## 2024-04-02 NOTE — Telephone Encounter (Signed)
 Pt c/o medication issue:  1. Name of Medication: amiodarone  (PACERONE ) 200 MG tablet   furosemide  (LASIX ) 40 MG tablet   2. How are you currently taking this medication (dosage and times per day)?    3. Are you having a reaction (difficulty breathing--STAT)? no  4. What is your medication issue? Calling to see if the dr will sign off one medication. Please advise

## 2024-04-02 NOTE — Telephone Encounter (Signed)
 Patient was prescribed these medications in the hospital and is about to run out. She has not been seen since her hospitalization but has scheduled follow up with heart failure clinic on Monday and our office on 11/13. Will send in short supply until she is seen.

## 2024-04-02 NOTE — Telephone Encounter (Signed)
Pt returned call to confirm

## 2024-04-05 ENCOUNTER — Ambulatory Visit (HOSPITAL_COMMUNITY)
Admission: RE | Admit: 2024-04-05 | Discharge: 2024-04-05 | Disposition: A | Discharge status: Home / Self Care | Payer: MEDICAID | Source: Ambulatory Visit

## 2024-04-05 ENCOUNTER — Other Ambulatory Visit: Payer: Self-pay

## 2024-04-05 ENCOUNTER — Other Ambulatory Visit (HOSPITAL_COMMUNITY): Payer: Self-pay

## 2024-04-05 ENCOUNTER — Encounter (HOSPITAL_COMMUNITY): Payer: Self-pay

## 2024-04-05 ENCOUNTER — Ambulatory Visit (HOSPITAL_COMMUNITY)
Admission: RE | Admit: 2024-04-05 | Discharge: 2024-04-05 | Disposition: A | Discharge status: Home / Self Care | Payer: MEDICAID | Source: Ambulatory Visit | Attending: Family Medicine | Admitting: Family Medicine

## 2024-04-05 VITALS — BP 90/64 | HR 87 | Wt 276.6 lb

## 2024-04-05 DIAGNOSIS — Z7901 Long term (current) use of anticoagulants: Secondary | ICD-10-CM | POA: Diagnosis not present

## 2024-04-05 DIAGNOSIS — Q2112 Patent foramen ovale: Secondary | ICD-10-CM | POA: Diagnosis not present

## 2024-04-05 DIAGNOSIS — F419 Anxiety disorder, unspecified: Secondary | ICD-10-CM | POA: Insufficient documentation

## 2024-04-05 DIAGNOSIS — I130 Hypertensive heart and chronic kidney disease with heart failure and stage 1 through stage 4 chronic kidney disease, or unspecified chronic kidney disease: Secondary | ICD-10-CM | POA: Diagnosis not present

## 2024-04-05 DIAGNOSIS — I2510 Atherosclerotic heart disease of native coronary artery without angina pectoris: Secondary | ICD-10-CM | POA: Diagnosis not present

## 2024-04-05 DIAGNOSIS — I428 Other cardiomyopathies: Secondary | ICD-10-CM | POA: Insufficient documentation

## 2024-04-05 DIAGNOSIS — Z5982 Transportation insecurity: Secondary | ICD-10-CM | POA: Diagnosis not present

## 2024-04-05 DIAGNOSIS — F32A Depression, unspecified: Secondary | ICD-10-CM | POA: Insufficient documentation

## 2024-04-05 DIAGNOSIS — Z7984 Long term (current) use of oral hypoglycemic drugs: Secondary | ICD-10-CM | POA: Diagnosis not present

## 2024-04-05 DIAGNOSIS — I340 Nonrheumatic mitral (valve) insufficiency: Secondary | ICD-10-CM | POA: Insufficient documentation

## 2024-04-05 DIAGNOSIS — Z139 Encounter for screening, unspecified: Secondary | ICD-10-CM

## 2024-04-05 DIAGNOSIS — I5022 Chronic systolic (congestive) heart failure: Secondary | ICD-10-CM | POA: Diagnosis present

## 2024-04-05 DIAGNOSIS — N183 Chronic kidney disease, stage 3 unspecified: Secondary | ICD-10-CM | POA: Insufficient documentation

## 2024-04-05 DIAGNOSIS — R0683 Snoring: Secondary | ICD-10-CM | POA: Insufficient documentation

## 2024-04-05 DIAGNOSIS — Z6841 Body Mass Index (BMI) 40.0 and over, adult: Secondary | ICD-10-CM | POA: Diagnosis not present

## 2024-04-05 DIAGNOSIS — I4892 Unspecified atrial flutter: Secondary | ICD-10-CM | POA: Diagnosis not present

## 2024-04-05 DIAGNOSIS — Z79899 Other long term (current) drug therapy: Secondary | ICD-10-CM | POA: Insufficient documentation

## 2024-04-05 LAB — CBC
HCT: 39.2 % (ref 36.0–46.0)
Hemoglobin: 12.6 g/dL (ref 12.0–15.0)
MCH: 25.2 pg — ABNORMAL LOW (ref 26.0–34.0)
MCHC: 32.1 g/dL (ref 30.0–36.0)
MCV: 78.4 fL — ABNORMAL LOW (ref 80.0–100.0)
Platelets: 191 K/uL (ref 150–400)
RBC: 5 MIL/uL (ref 3.87–5.11)
RDW: 16.7 % — ABNORMAL HIGH (ref 11.5–15.5)
WBC: 7.6 K/uL (ref 4.0–10.5)
nRBC: 0 % (ref 0.0–0.2)

## 2024-04-05 LAB — COMPREHENSIVE METABOLIC PANEL WITH GFR
ALT: 21 U/L (ref 0–44)
AST: 22 U/L (ref 15–41)
Albumin: 3.4 g/dL — ABNORMAL LOW (ref 3.5–5.0)
Alkaline Phosphatase: 86 U/L (ref 38–126)
Anion gap: 10 (ref 5–15)
BUN: 44 mg/dL — ABNORMAL HIGH (ref 6–20)
CO2: 25 mmol/L (ref 22–32)
Calcium: 8.8 mg/dL — ABNORMAL LOW (ref 8.9–10.3)
Chloride: 108 mmol/L (ref 98–111)
Creatinine, Ser: 1.96 mg/dL — ABNORMAL HIGH (ref 0.44–1.00)
GFR, Estimated: 29 mL/min — ABNORMAL LOW (ref >60)
Glucose, Bld: 88 mg/dL (ref 70–99)
Potassium: 5.2 mmol/L — ABNORMAL HIGH (ref 3.5–5.1)
Sodium: 143 mmol/L (ref 135–145)
Total Bilirubin: 0.4 mg/dL (ref 0.0–1.2)
Total Protein: 6.4 g/dL — ABNORMAL LOW (ref 6.5–8.1)

## 2024-04-05 LAB — TSH: TSH: 4.02 u[IU]/mL (ref 0.350–4.500)

## 2024-04-05 LAB — BRAIN NATRIURETIC PEPTIDE: B Natriuretic Peptide: 59.9 pg/mL (ref 0.0–100.0)

## 2024-04-05 MED ORDER — AMIODARONE HCL 200 MG PO TABS
200.0000 mg | ORAL_TABLET | Freq: Every day | ORAL | 11 refills | Status: AC
  Filled 2024-04-05: qty 30, 30d supply, fill #0

## 2024-04-05 MED ORDER — OMRON 3 SERIES BP MONITOR DEVI
0 refills | Status: AC
  Filled 2024-04-05: qty 1, 90d supply, fill #0

## 2024-04-05 MED ORDER — LOSARTAN POTASSIUM 25 MG PO TABS
12.5000 mg | ORAL_TABLET | Freq: Every day | ORAL | 3 refills | Status: AC
Start: 2024-04-05 — End: 2024-07-05
  Filled 2024-04-05: qty 45, 90d supply, fill #0

## 2024-04-05 NOTE — Progress Notes (Incomplete)
 ADVANCED HF CLINIC CONSULT NOTE  Primary Care: No primary care provider on file. Primary Cardiologist: Madonna Large, DO  HPI: 58 y/o female with a h/o NICM  in 2016, EF then 25%, cath showed normal coronaries. EF ultimately improved, up to 60-65% in 2022. Also w/ h/o HTN, morbid obesity, CKD III and anxiety.    Now admitted w/ acute CHF. Presented w/ worsening exertional dyspnea over the last several months. Initial 2D Echo showed EF 40-45%, RV normal, mod-severe MR, severe LAE, mod RAE. F/u TEE showed EF 35-40%, RV mod reduced, severe MR, + bubble study w/ small PFO. No LA thrombus.    Underwent R/LHC earlier today showing mild two-vessel disease involving the LAD and OM 2. Prox LAD to Mid LAD lesion 50% stenosed with 50% stenosed side branch in 1st Diag. Dist LAD lesion is 40% stenosed. Dist Cx lesion is 30% stenosed. 2nd Mrg lesion is 40% stenosed. RHC demonstrated elevated biventricular filling pressures and low cardiac output, RA 15, PA 52/28 (38), PCW 38, PA sat 52%, CO 3.82, CI 1.7, PAPi 1.6.   Of note, pt also went in to AFL w/ RVR this morning. Initial rates 150s. Now in the 90s.    AHF team consulted to assist w/ further management.    She is currently getting IV Lasix . On Lopressor  25 mg bid. Got dose this morning.     SCr 1.40 (1.8 on admit).  K 4.6   3L in UOP yesterday. 1.8L in UOP thus far today. SBPs 130s.      TEE 02/23/24 1. Left ventricular ejection fraction, by estimation, is 35 to 40%. The  left ventricle has moderately decreased function. The left ventricle  demonstrates global hypokinesis. The left ventricular internal cavity size  was moderately dilated.   2. Right ventricular systolic function is moderately reduced. The right  ventricular size is mildly enlarged.   3. Left atrial size was moderately dilated. No left atrial/left atrial  appendage thrombus was detected.   4. Right atrial size was mildly dilated.   5. A small pericardial effusion is present.    6. The aortic valve is tricuspid. Aortic valve regurgitation is not  visualized. No aortic stenosis is present.   7. Evidence of atrial level shunting detected by color flow Doppler.  Agitated saline contrast bubble study was positive with shunting observed  within 3-6 cardiac cycles suggestive of interatrial shunt. There is a  small patent foramen ovale.   8. 3D performed of the mitral valve and demonstrates severe MR.   9. The mitral valve is abnormal. Severe mitral valve regurgitation. MR  appears functional. There are 2 central MR jets, with total 3D VCA  0.5cm^2; in addition, there is RLPV systolic flow reversal. This is  consistent with severe MR      [facility] 02/25/24 Mild two-vessel disease involving the LAD and OM 2  Prox LAD to Mid LAD lesion is 50% stenosed with 50% stenosed side branch in 1st Diag. Dist LAD lesion is 40% stenosed. Dist Cx lesion is 30% stenosed. 2nd Mrg lesion is 40% stenosed.  Moderate pulmonary pretension with mean PAP 38 mmHg and PCWP 38 mmHg and LVEDP of 29 mmHg. Prominent berry is noted, but not giant.    Pressures Phases Resting  Right      RA Mean  mmHg 15    RA A-Wave  mmHg 17    RA V-Wave  mmHg 17  Pulmonary      PA  mmHg 52/28 (38)  PCW Mean  mmHg 38.0    PCW A-Wave  mmHg 39.0    PCW V-Wave  mmHg 41.0    PAPi   1.6    Saturations Phases Resting    PA  % 52    Arterial  % 97    Fick Cardiac Output 3.82 L/min  Fick Cardiac Output Index 1.7 (L/min)/BSA           Past Medical History:  Diagnosis Date   Anxiety    Chronic systolic heart failure (HCC)    Depression    HTN (hypertension)    Microcytic anemia    NICM (nonischemic cardiomyopathy) (HCC) 11/2014   EF is 25% improved to 55% in 2015.   Pre-diabetes     Current Outpatient Medications  Medication Sig Dispense Refill   amiodarone  (PACERONE ) 200 MG tablet Take 1 tablet (200 mg total) by mouth 2 (two) times daily. 60 tablet 0   apixaban  (ELIQUIS ) 5 MG TABS tablet Take  1 tablet (5 mg total) by mouth 2 (two) times daily. 180 tablet 0   ARIPiprazole  (ABILIFY ) 5 MG tablet Take 1 tablet (5 mg total) by mouth daily. 90 tablet 0   ARIPiprazole  (ABILIFY ) 5 MG tablet Take 1 tablet (5 mg total) by mouth daily. 90 tablet 0   atorvastatin  (LIPITOR) 40 MG tablet Take 1 tablet (40 mg total) by mouth daily. 90 tablet 0   busPIRone  (BUSPAR ) 10 MG tablet Take 1 tablet (10 mg total) by mouth 2 (two) times daily. 180 tablet 0   busPIRone  (BUSPAR ) 10 MG tablet Take 1 tablet (10 mg total) by mouth 2 (two) times daily. 180 tablet 0   carvedilol  (COREG ) 3.125 MG tablet Take 1 tablet (3.125 mg total) by mouth 2 (two) times daily with a meal. 180 tablet 0   carvedilol  (COREG ) 6.25 MG tablet Take 1 tablet (6.25 mg total) by mouth 2 (two) times daily with a meal. 180 tablet 0   dapagliflozin propanediol (FARXIGA) 10 MG TABS tablet Take 1 tablet (10 mg total) by mouth daily. 90 tablet 0   furosemide  (LASIX ) 40 MG tablet Take 1 tablet (40 mg total) by mouth daily as needed for fluid or edema (take in case of weight gain 2 to 3 lbs in 24 hrs or 5 lbs in seven days). 30 tablet 0   lamoTRIgine  (LAMICTAL ) 25 MG tablet Take 1 tablet (25 mg total) by mouth 2 (two) times daily. 180 tablet 0   lamoTRIgine  (LAMICTAL ) 25 MG tablet Take 1 tablet (25 mg total) by mouth 2 (two) times daily. 180 tablet 0   potassium chloride SA (KLOR-CON M) 20 MEQ tablet Take 2 tablets (40 mEq total) by mouth daily as needed (take only with furosemide ). 60 tablet 0   sacubitril -valsartan  (ENTRESTO ) 24-26 MG Take 1 tablet by mouth 2 (two) times daily. 180 tablet 0   sertraline  (ZOLOFT ) 100 MG tablet Take 2 tablets (200 mg total) by mouth daily. 180 tablet 0   sertraline  (ZOLOFT ) 50 MG tablet Take 1 tablet (50 mg total) by mouth daily. 90 tablet 0   spironolactone  (ALDACTONE ) 25 MG tablet Take 0.5 tablets (12.5 mg total) by mouth daily. 45 tablet 0   spironolactone  (ALDACTONE ) 25 MG tablet Take 1 tablet (25 mg total) by  mouth daily. 90 tablet 2   No current facility-administered medications for this visit.    Allergies  Allergen Reactions   Sulfa Antibiotics Other (See Comments)    Childhood allergy with unknown reaction  Social History   Socioeconomic History   Marital status: Single    Spouse name: Not on file   Number of children: Not on file   Years of education: Not on file   Highest education level: Not on file  Occupational History   Not on file  Tobacco Use   Smoking status: Never   Smokeless tobacco: Never  Vaping Use   Vaping status: Never Used  Substance and Sexual Activity   Alcohol use: No    Alcohol/week: 0.0 standard drinks of alcohol   Drug use: No   Sexual activity: Never  Other Topics Concern   Not on file  Social History Narrative   Social Hx:   Current living situation: Lives alone in apt in [place]: [place], [place] by parents.   Raised by parents. Parents let her do whatever she wanted to do while growing up. Both parents now deceased.    Siblings 1 brother and 2 sisters. Pt is number 3   Schooling- HS grad, faced a lot of bullied due to teen pregnancy at 56. Lived with parents while raising her oldest child but for the youngest she moved out temporarily. Her mom took over raising him at the age of 83 mo.    Employed- last job in 2002. 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 for not paying child support            Social Drivers of Corporate Investment Banker Strain: Not on file  Food Insecurity: No Food Insecurity (02/18/2024)   Hunger Vital Sign    Worried About Running Out of Food in the Last Year: Never true    Ran Out of Food in the Last Year: Never true  Transportation Needs: No Transportation Needs (02/18/2024)   PRAPARE - Administrator, Civil Service (Medical): No    Lack of Transportation (Non-Medical): No  Physical Activity: Not on file  Stress: Not on file   Social Connections: Not on file  Intimate Partner Violence: Not At Risk (02/18/2024)   Humiliation, Afraid, Rape, and Kick questionnaire    Fear of Current or Ex-Partner: No    Emotionally Abused: No    Physically Abused: No    Sexually Abused: No      Family History  Problem Relation Age of Onset   Diabetes Mother    Hypertension Mother    Diabetes Father    Heart disease Father    Hypertension Father    Diabetes Sister    ADD / ADHD Son    Alcohol abuse Neg Hx    Anxiety disorder Neg Hx    Bipolar disorder Neg Hx    Depression Neg Hx    Drug abuse Neg Hx     There were no vitals filed for this visit.  PHYSICAL EXAM: General:  Well appearing. No respiratory difficulty HEENT: normal Neck: supple. no JVD. Carotids 2+ bilat; no bruits. No lymphadenopathy or thryomegaly appreciated. Cor: PMI nondisplaced. Regular rate & rhythm. No rubs, gallops or murmurs. Lungs: clear Abdomen: soft, nontender, nondistended. No hepatosplenomegaly. No bruits or masses. Good bowel sounds. Extremities: no cyanosis, clubbing, rash, edema Neuro: alert & oriented x 3, cranial nerves grossly intact. moves all 4 extremities w/o difficulty. Affect pleasant.  ECG:   ASSESSMENT & PLAN: 1. Acute on chronic systolic CHF: Nonischemic cardiomyopathy.  EF has fluctuated from decreased  to normal range since 2016.  TEE this admission showed EF 35-40%, moderate LV dilation, moderate RV dysfunction, and severe MR that appeared functional. LHC/RHC this admission showed nonobstructive CAD and significantly elevated right and left heart filling pressures with low CI 1.7. Limited echo after diuresis and in NSR 9/30: EF 45-50%, RV okay, moderate to severe MR.  - She was diuresed and developed AKI, GDMT scaled back and diuretic stopped.  CO-OX 77% today - CVP 2. Does not need diuretic today. - Continue Entresto  24/26 bid.  - Continue Farxiga 10 daily.  - Continue Coreg  3.125 mg bid.  - Can stop digoxin  with EF up  to 45-50%.  - Add spiro 12.5 mg daily 2. Atrial flutter: AFL was first noted this admission.  Mitral regurgitation will make this harder to manage.  S/p DCCV to SR. Current maintaining SR. - Continue po amiodarone .  - Continue Eliquis .  3. Mitral regurgitation: This appears severe and functional on TEE.  Noted from prior echoes when EF is low, improves when EF is in normal range.  It may be that we can decrease the mitral regurgitation by diuresis, resumption of NSR, and afterload reduction/GDMT titration.  - Repeat limited echo 9/30 w/ LVEF 45-50%, RV okay and moderate to severe MR - Will need to follow closely 5. CAD: Nonobstructive on cath today.  - No ASA with Eliquis  use.  - Add statin.  6. AKI on CKD stage 3: Creatinine slightly lower today at 1.6.  7. Depression/anxiety: See med list for psych meds.  QTc mildly prolonged at baseline, follow closely.    Will discuss discharge home today with Dr. Rolan. Has f/u scheduled in HF clinic. HF meds to [facility] at discharge. HF pharmD assisting with future refills through mail order.

## 2024-04-05 NOTE — Progress Notes (Signed)
 ADVANCED HF CLINIC CONSULT NOTE  Primary Care: Dr. Penne Primary Cardiologist: Madonna Large, DO HF Cardiologist: Dr. Rolan  HPI: 58 y.o. female with a h/o NICM  in 2016, EF then 25%, cath showed normal coronaries. EF ultimately improved, up to 60-65% in 2022. Also w/ h/o HTN, morbid obesity, CKD III and anxiety.    Admitted 10/25 w/ acute CHF. Initial echo showed EF 40-45%, RV normal, mod-severe MR, severe LAE, mod RAE. Follow up TEE showed EF 35-40%, RV mod reduced, severe MR, + bubble study w/ small PFO. No LA thrombus. Underwent R/LHC showing mild two-vessel disease involving the LAD and OM 2. Prox LAD to Mid LAD lesion 50% stenosed with 50% stenosed side branch in 1st Diag. Dist LAD lesion is 40% stenosed. Dist Cx lesion is 30% stenosed. 2nd Mrg lesion is 40% stenosed. RHC demonstrated elevated biventricular filling pressures and low cardiac output, RA 15, PA 52/28 (38), PCW 38, PA sat 52%, CO 3.82, CI 1.7, PAPi 1.6. Went into AFL with RVR, started on amiodarone . Underwent DCCV to NSR. Repeat ltd echo, after diuresis and conversion to NSR, showed EF 45-50%, RV okay, moderate to severe MR. GDMT titrated and she was discharged home, weight 270 lbs.  Today she returns for post hospital HF follow up with her niece. Overall feeling dizzy and SOB. She has SOB with ADLs and walking short distances on flat ground. She has positional dizziness, no falls. Denies palpitations, abnormal bleeding, CP, edema, or PND/Orthopnea. Appetite ok. She does not weigh at home. Taking all medications. Took Lasix  3x since discharge. She snores. Niece helps with transportation.  ReDs reading: 29 %, abnormal  ECG (personally reviewed): NSR 78 bpm, QTc 449 msec  Labs (9/25): LDL 98, Lp(a) 9.7, normal TSH, normal LFTs Labs (10/25): K 4.4, creatinine 1.59  Cardiac Studies: - Cath 2016: normal cors - Echo 2016: EF 25% - Echo 2022: EF 60-65% - Echo 9/25: EF 40-45%, normal RV, mod-sev MR - TEE 9/25: EF 35-40%, RV  mod reduced, severe MR, + bubble study w/ small PFO. No LA thrombus.  - R/LHC 9/25: 2v CAD LAD and OM 2; RA 15, PA 52/28 (38), PCWP 38, CO/CI (Fick) 3.82/1.7, PAPi 1.6  Past Medical History:  Diagnosis Date   Anxiety    Chronic systolic heart failure (HCC)    Depression    HTN (hypertension)    Microcytic anemia    NICM (nonischemic cardiomyopathy) (HCC) 11/2014   EF is 25% improved to 55% in 2015.   Pre-diabetes    Current Outpatient Medications  Medication Sig Dispense Refill   amiodarone  (PACERONE ) 200 MG tablet Take 1 tablet (200 mg total) by mouth 2 (two) times daily. 60 tablet 0   apixaban  (ELIQUIS ) 5 MG TABS tablet Take 1 tablet (5 mg total) by mouth 2 (two) times daily. 180 tablet 0   ARIPiprazole  (ABILIFY ) 5 MG tablet Take 1 tablet (5 mg total) by mouth daily. 90 tablet 0   atorvastatin  (LIPITOR) 40 MG tablet Take 1 tablet (40 mg total) by mouth daily. 90 tablet 0   busPIRone  (BUSPAR ) 10 MG tablet Take 1 tablet (10 mg total) by mouth 2 (two) times daily. 180 tablet 0   carvedilol  (COREG ) 3.125 MG tablet Take 1 tablet (3.125 mg total) by mouth 2 (two) times daily with a meal. 180 tablet 0   dapagliflozin propanediol (FARXIGA) 10 MG TABS tablet Take 1 tablet (10 mg total) by mouth daily. 90 tablet 0   furosemide  (LASIX ) 40 MG tablet  Take 1 tablet (40 mg total) by mouth daily as needed for fluid or edema (take in case of weight gain 2 to 3 lbs in 24 hrs or 5 lbs in seven days). 30 tablet 0   lamoTRIgine  (LAMICTAL ) 25 MG tablet Take 1 tablet (25 mg total) by mouth 2 (two) times daily. 180 tablet 0   potassium chloride SA (KLOR-CON M) 20 MEQ tablet Take 2 tablets (40 mEq total) by mouth daily as needed (take only with furosemide ). 60 tablet 0   sacubitril -valsartan  (ENTRESTO ) 24-26 MG Take 1 tablet by mouth 2 (two) times daily. 180 tablet 0   sertraline  (ZOLOFT ) 100 MG tablet Take 2 tablets (200 mg total) by mouth daily. 180 tablet 0   sertraline  (ZOLOFT ) 50 MG tablet Take 1 tablet  (50 mg total) by mouth daily. 90 tablet 0   spironolactone  (ALDACTONE ) 25 MG tablet Take 1 tablet (25 mg total) by mouth daily. 90 tablet 2   ARIPiprazole  (ABILIFY ) 5 MG tablet Take 1 tablet (5 mg total) by mouth daily. 90 tablet 0   busPIRone  (BUSPAR ) 10 MG tablet Take 1 tablet (10 mg total) by mouth 2 (two) times daily. 180 tablet 0   carvedilol  (COREG ) 6.25 MG tablet Take 1 tablet (6.25 mg total) by mouth 2 (two) times daily with a meal. 180 tablet 0   lamoTRIgine  (LAMICTAL ) 25 MG tablet Take 1 tablet (25 mg total) by mouth 2 (two) times daily. 180 tablet 0   spironolactone  (ALDACTONE ) 25 MG tablet Take 0.5 tablets (12.5 mg total) by mouth daily. 45 tablet 0   No current facility-administered medications for this encounter.    Allergies  Allergen Reactions   Sulfa Antibiotics Other (See Comments)    Childhood allergy with unknown reaction   Social History   Socioeconomic History   Marital status: Single    Spouse name: Not on file   Number of children: Not on file   Years of education: Not on file   Highest education level: Not on file  Occupational History   Not on file  Tobacco Use   Smoking status: Never   Smokeless tobacco: Never  Vaping Use   Vaping status: Never Used  Substance and Sexual Activity   Alcohol use: No    Alcohol/week: 0.0 standard drinks of alcohol   Drug use: No   Sexual activity: Never  Other Topics Concern   Not on file  Social History Narrative   Social Hx:   Current living situation: Lives alone in apt in [place]: [place], [place] by parents.   Raised by parents. Parents let her do whatever she wanted to do while growing up. Both parents now deceased.    Siblings 1 brother and 2 sisters. Pt is number 3   Schooling- HS grad, faced a lot of bullied due to teen pregnancy at 6. Lived with parents while raising her oldest child but for the youngest she moved out temporarily. Her mom took over raising him at the age of 6 mo.    Employed- last job in  2002. 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 for not paying child support            Social Drivers of Corporate Investment Banker Strain: Not on file  Food Insecurity: No Food Insecurity (02/18/2024)   Hunger Vital Sign    Worried About Running Out  of Food in the Last Year: Never true    Ran Out of Food in the Last Year: Never true  Transportation Needs: No Transportation Needs (02/18/2024)   PRAPARE - Administrator, Civil Service (Medical): No    Lack of Transportation (Non-Medical): No  Physical Activity: Not on file  Stress: Not on file  Social Connections: Not on file  Intimate Partner Violence: Not At Risk (02/18/2024)   Humiliation, Afraid, Rape, and Kick questionnaire    Fear of Current or Ex-Partner: No    Emotionally Abused: No    Physically Abused: No    Sexually Abused: No    Family History  Problem Relation Age of Onset   Diabetes Mother    Hypertension Mother    Diabetes Father    Heart disease Father    Hypertension Father    Diabetes Sister    ADD / ADHD Son    Alcohol abuse Neg Hx    Anxiety disorder Neg Hx    Bipolar disorder Neg Hx    Depression Neg Hx    Drug abuse Neg Hx    Wt Readings from Last 3 Encounters:  04/05/24 125.5 kg (276 lb 9.6 oz)  03/03/24 122.7 kg (270 lb 8.1 oz)  01/02/24 125.6 kg (277 lb)   BP 90/64   Pulse 87   Wt 125.5 kg (276 lb 9.6 oz)   LMP 03/29/2016   SpO2 96%   BMI 47.48 kg/m   PHYSICAL EXAM: General:  NAD. No resp difficulty, walked into clinic HEENT: Normal Neck: Supple. No JVD. Thick neck Cor: Regular rate & rhythm. No rubs, gallops, 2/6 MR Lungs: Clear Abdomen: Soft,obese, nontender, nondistended.  Extremities: No cyanosis, clubbing, rash, edema Neuro: Alert & oriented x 3, moves all 4 extremities w/o difficulty. Affect pleasant.  ASSESSMENT & PLAN: 1. Chronic systolic CHF: Nonischemic cardiomyopathy.  EF has  fluctuated from decreased to normal range since 2016.  TEE this admission 9/25, showed EF 35-40%, moderate LV dilation, moderate RV dysfunction, and severe MR that appeared functional. LHC/RHC this admission 9/25 showed nonobstructive CAD and significantly elevated right and left heart filling pressures, with low CI 1.7. Limited echo, after diuresis and in NSR, showed EF 45-50%, RV okay, moderate to severe MR. NYHA IIb-III, she is not volume overloaded today. ReDs 29%. GDMT limited by orthostasis. - Stop Entresto . - Start losartan  12.5 mg at bedtime. BMET and BNP today. - Continue Lasix  40 mg PRN + 40 KCL PRN - Continue Farxiga 10 mg daily.  - Continue Coreg  3.125 mg bid.  - Continue spiro 12.5 mg daily. - Update echo in 3 months 2. Atrial flutter: AFL was first noted this admission.  Mitral regurgitation will make this harder to manage.  S/p DCCV to SR. NSR on ECG today. - Decrease amiodarone  to 200 mg daily. Check LFTs and TSH today. She will need a regular eye exam while on amiodarone . - Continue Eliquis  5 mg bid. No bleeding issues. CBC today. 3. Mitral regurgitation: This appears severe and functional on TEE.  Noted from prior echoes when EF is low, improves when EF is in normal range.  It may be that we can decrease the mitral regurgitation by diuresis, resumption of NSR, and afterload reduction/GDMT titration.  - Repeat limited echo 9/30 w/ LVEF 45-50%, RV okay and moderate to severe MR - Will need to follow closely. 4. CAD: Nonobstructive on cath. No chest pain. - No ASA with Eliquis  use.  - Continue atorvastatin  40 mg  daily.  5. CKD stage 3: Baseline SCr 1.7 - Continue Farxiga. BMET today. 6. Depression/anxiety: See med list for psych meds.   - QTc mildly prolonged at baseline, follow closely. 7. Snoring: suspect sleep apnea. - Refer to sleep medicine. 8. SDOH: Engage HFSW for transportation and medication affordability needs.  Follow up in 3 weeks with PharmD and 3 months with Dr.  Rolan + echo   [place], FNP-BC 04/05/24

## 2024-04-05 NOTE — Progress Notes (Signed)
 H&V Care Navigation CSW Progress Note  Clinical Social Worker met with pt and pt niece to discuss current needs.  Pt reports mental health concerns- depression, anxiety, agoraphobia.  States she was seeing mental health al up until 2 months ago when they left.  CSW provided information about [facility] and encouraged to call for appt.   CSW informed about transportation concerns.  Pt not interested in utilizing transportation through Medicaid due to anxiety- only wants her niece to drive her because she trusts her.  CSW also informed copay concerns for medications- pt utilizes AR account with [facility]- is up to date on current bills- encouraged her to let us  know if she is unable to pay in the future and I could assist as needed.  Pt also reports she does not have PCP- sent message to Primary Care with [facility] to get appt.  No other needs identified at this time- will plan to check in during next visit  Abigail HILARIO Leech, LCSW Clinical Social Worker [facility] Desk#: [phone] Cell#: [phone]

## 2024-04-05 NOTE — Progress Notes (Signed)
 ReDS Vest / Clip - 04/05/24 1445       ReDS Vest / Clip   Station Marker B    Ruler Value 33    ReDS Value Range Low volume    ReDS Actual Value 29    Anatomical Comments sitting

## 2024-04-05 NOTE — Patient Instructions (Addendum)
 Thank you for coming in today  If you had labs drawn today, any labs that are abnormal the clinic will call you No news is good news  Scale was given to you at this appointment  Blood pressure cuff was sent to your pharmacy   Medications: Decrease Amiodarone  to 200 mg 1 tablet daily   Follow up appointments:  Your physician recommends that you schedule a follow-up appointment in:  3 weeks pharmacy  3 months with echocardiogram With Dr. Rolan Please call our office to schedule the follow-up appointment in December 2025 for February 2026  Your physician has requested that you have an echocardiogram. Echocardiography is a painless test that uses sound waves to create images of your heart. It provides your doctor with information about the size and shape of your heart and how well your heart's chambers and valves are working. This procedure takes approximately one hour. There are no restrictions for this procedure.      Do the following things EVERYDAY: Weigh yourself in the morning before breakfast. Write it down and keep it in a log. Take your medicines as prescribed Eat low salt foods--Limit salt (sodium) to 2000 mg per day.  Stay as active as you can everyday Limit all fluids for the day to less than 2 liters   At [facility], you and your health needs are our priority. As part of our continuing mission to provide you with exceptional heart care, we have created designated Provider Care Teams. These Care Teams include your primary Cardiologist (physician) and Advanced Practice Providers (APPs- Physician Assistants and Nurse Practitioners) who all work together to provide you with the care you need, when you need it.   You may see any of the following providers on your designated Care Team at your next follow up: Dr Toribio Fuel Dr Ezra Rolan Dr. Ria Gardenia Greig Lenetta, NP Caffie Shed, [place] [facility] [place], [place] Beckey Coe,  NP Tinnie Redman, PharmD   Please be sure to bring in all your medications bottles to every appointment.    Thank you for choosing  [facility]  If you have any questions or concerns before your next appointment please send us  a message through [place] or call our office at [phone].    TO LEAVE A MESSAGE FOR THE NURSE SELECT OPTION 2, PLEASE LEAVE A MESSAGE INCLUDING: YOUR NAME DATE OF BIRTH CALL BACK NUMBER REASON FOR CALL**this is important as we prioritize the call backs  YOU WILL RECEIVE A CALL BACK THE SAME DAY AS LONG AS YOU CALL BEFORE 4:00 PM

## 2024-04-06 ENCOUNTER — Ambulatory Visit (HOSPITAL_COMMUNITY): Payer: Self-pay | Admitting: Family Medicine

## 2024-04-07 NOTE — Addendum Note (Signed)
 Encounter addended by: Cathern Andriette DEL, LCSW on: 04/07/2024 10:35 AM  Actions taken: Clinical Note Signed

## 2024-04-13 ENCOUNTER — Ambulatory Visit (HOSPITAL_COMMUNITY): Payer: MEDICAID | Admitting: Psychiatry

## 2024-04-14 ENCOUNTER — Other Ambulatory Visit: Payer: Self-pay

## 2024-04-14 ENCOUNTER — Other Ambulatory Visit (HOSPITAL_COMMUNITY): Payer: Self-pay

## 2024-04-15 ENCOUNTER — Ambulatory Visit: Payer: MEDICAID | Admitting: Cardiology

## 2024-04-15 ENCOUNTER — Ambulatory Visit: Payer: Self-pay | Admitting: Orthopedic Surgery

## 2024-04-15 NOTE — Telephone Encounter (Signed)
Okay.   ST

## 2024-04-16 ENCOUNTER — Other Ambulatory Visit (HOSPITAL_COMMUNITY): Payer: Self-pay

## 2024-04-22 ENCOUNTER — Ambulatory Visit: Payer: Self-pay

## 2024-04-22 NOTE — Telephone Encounter (Signed)
 FYI Only or Action Required?: FYI only for provider: patient will call back to schedule new patient appointment.  Called Nurse Triage reporting Knee Pain.  Symptoms began chronic problem.  Symptoms are: unchanged.  Triage Disposition: Home Care  Patient/caregiver understands and will follow disposition?: Yes     Copied from CRM #[id number]. Topic: Clinical - Red Word Triage >> Apr 22, 2024  5:01 PM China J wrote: [facility] that prompted transfer to Nurse Triage:  Pain that starts from her lowerback/hip that goes down to knees       Reason for Disposition  Knee pain    Knee pain is a chronic problem. Patient has an appointment with orthopedics scheduled for the same. Patient looking to establish as a new patient.  Answer Assessment - Initial Assessment Questions 1. LOCATION and RADIATION: Where is the pain located?      Bilateral  3. SEVERITY: How bad is the pain? What does it keep you from doing?   (Scale 1-10; or mild, moderate, severe)     Mild to moderate  4. ONSET: When did the pain start? Does it come and go, or is it there all the time?     Chronic problem 5. RECURRENT: Have you had this pain before? If Yes, ask: When, and what happened then?     Yes 6. SETTING: Has there been any recent work, exercise or other activity that involved that part of the body?      No 7. AGGRAVATING FACTORS: What makes the knee pain worse? (e.g., walking, climbing stairs, running)     Standing  8. ASSOCIATED SYMPTOMS: Is there any swelling or redness of the knee?     No 9. OTHER SYMPTOMS: Do you have any other symptoms? (e.g., calf pain, chest pain, difficulty breathing, fever)     Lower back pain  Protocols used: Knee Pain-A-AH

## 2024-04-23 ENCOUNTER — Ambulatory Visit: Payer: Self-pay

## 2024-04-23 ENCOUNTER — Ambulatory Visit (HOSPITAL_COMMUNITY): Payer: MEDICAID

## 2024-04-23 NOTE — Telephone Encounter (Signed)
 FYI Only or Action Required?: FYI only for provider: ED advised and UC.  Patient was last seen in primary care on NA.  Called Nurse Triage reporting Knee Pain.  Symptoms began several years ago.  Interventions attempted: Nothing.  Symptoms are: gradually worsening.  Triage Disposition: See HCP Within 4 Hours (Or PCP Triage)  Patient/caregiver understands and will follow disposition?: No, wishes to speak with PCP   Copied from CRM #[id number]. Topic: Clinical - Red Word Triage >> Apr 23, 2024  4:52 PM Roselie BROCKS wrote: Red Word that prompted transfer to Nurse Triage: Patient called in to schedule a new patient appnt with Dr Vita, but stated she is having bad pain in both knees and not able to stand long. Reason for Disposition  [1] SEVERE pain (e.g., excruciating, unable to walk) AND [2] not improved after 2 hours of pain medicine  Answer Assessment - Initial Assessment Questions Advised UC/ED for symptoms. Patient declines.  Scheduled new patient appt 05/25/24.  1. LOCATION and RADIATION: Where is the pain located?      Both knees; chronic pain. Hip pain bilateral, lower back 2. QUALITY: What does the pain feel like?  (e.g., sharp, dull, aching, burning)     Currently don't feel pain, but when I stand up start having pain in knees 3. SEVERITY: How bad is the pain? What does it keep you from doing?   (Scale 1-10; or mild, moderate, severe)     No pain currently; when standing it's 10/10 4. ONSET: When did the pain start? Does it come and go, or is it there all the time?     Years ago 5. RECURRENT: Have you had this pain before? If Yes, ask: When, and what happened then?     yes  7. AGGRAVATING FACTORS: What makes the knee pain worse? (e.g., walking, climbing stairs, running)     standing 8. ASSOCIATED SYMPTOMS: Is there any swelling or redness of the knee?     Denies swelling or redness 9. OTHER SYMPTOMS: Do you have any other symptoms? (e.g., calf pain,  chest pain, difficulty breathing, fever)     Denies numbness, calf pain, chest pain, diff breathing, fever Reports weakness  Protocols used: Knee Pain-A-AH

## 2024-05-04 ENCOUNTER — Telehealth (HOSPITAL_COMMUNITY): Payer: Self-pay | Admitting: Cardiology

## 2024-05-05 ENCOUNTER — Ambulatory Visit (HOSPITAL_COMMUNITY): Payer: MEDICAID

## 2024-05-12 ENCOUNTER — Encounter: Payer: Self-pay | Admitting: Cardiology

## 2024-05-14 ENCOUNTER — Telehealth (HOSPITAL_COMMUNITY): Payer: Self-pay | Admitting: Vascular Surgery

## 2024-05-14 NOTE — Telephone Encounter (Signed)
LVM, returned pt call.

## 2024-05-24 ENCOUNTER — Ambulatory Visit: Payer: MEDICAID | Admitting: Family Medicine

## 2024-05-25 ENCOUNTER — Ambulatory Visit: Payer: MEDICAID | Admitting: Family Medicine

## 2024-06-01 ENCOUNTER — Ambulatory Visit: Payer: MEDICAID | Admitting: Family Medicine

## 2024-06-16 ENCOUNTER — Ambulatory Visit: Payer: MEDICAID | Admitting: Family Medicine

## 2024-06-16 ENCOUNTER — Ambulatory Visit: Payer: MEDICAID | Admitting: Orthopedic Surgery

## 2024-07-26 ENCOUNTER — Ambulatory Visit: Payer: MEDICAID | Admitting: Orthopedic Surgery

## 2024-08-16 ENCOUNTER — Ambulatory Visit: Payer: MEDICAID | Admitting: Family Medicine
